# Patient Record
Sex: Male | Born: 1945 | Race: White | Hispanic: No | Marital: Married | Smoking: Former smoker
Health system: Southern US, Community
[De-identification: ages and names within clinical notes are randomized; demographics above are authoritative.]

## PROBLEM LIST (undated history)

## (undated) DIAGNOSIS — K219 Gastro-esophageal reflux disease without esophagitis: Secondary | ICD-10-CM

## (undated) DIAGNOSIS — F419 Anxiety disorder, unspecified: Secondary | ICD-10-CM

## (undated) DIAGNOSIS — I6522 Occlusion and stenosis of left carotid artery: Secondary | ICD-10-CM

## (undated) DIAGNOSIS — Z8719 Personal history of other diseases of the digestive system: Secondary | ICD-10-CM

## (undated) DIAGNOSIS — R0602 Shortness of breath: Secondary | ICD-10-CM

## (undated) DIAGNOSIS — J40 Bronchitis, not specified as acute or chronic: Secondary | ICD-10-CM

## (undated) DIAGNOSIS — F319 Bipolar disorder, unspecified: Secondary | ICD-10-CM

## (undated) DIAGNOSIS — I639 Cerebral infarction, unspecified: Secondary | ICD-10-CM

## (undated) DIAGNOSIS — M1990 Unspecified osteoarthritis, unspecified site: Secondary | ICD-10-CM

## (undated) DIAGNOSIS — G4730 Sleep apnea, unspecified: Secondary | ICD-10-CM

## (undated) DIAGNOSIS — I10 Essential (primary) hypertension: Secondary | ICD-10-CM

## (undated) DIAGNOSIS — C801 Malignant (primary) neoplasm, unspecified: Secondary | ICD-10-CM

## (undated) HISTORY — PX: FRACTURE SURGERY: SHX138

## (undated) HISTORY — PX: CHOLECYSTECTOMY: SHX55

## (undated) HISTORY — PX: OTHER SURGICAL HISTORY: SHX169

## (undated) HISTORY — PX: TONSILLECTOMY: SUR1361

## (undated) HISTORY — PX: KNEE SURGERY: SHX244

---

## 1997-11-21 ENCOUNTER — Ambulatory Visit (HOSPITAL_COMMUNITY): Admission: RE | Admit: 1997-11-21 | Discharge: 1997-11-21 | Payer: Self-pay | Admitting: Sports Medicine

## 1999-12-21 ENCOUNTER — Emergency Department (HOSPITAL_COMMUNITY): Admission: EM | Admit: 1999-12-21 | Discharge: 1999-12-21 | Payer: Self-pay | Admitting: Emergency Medicine

## 1999-12-21 ENCOUNTER — Encounter: Payer: Self-pay | Admitting: Emergency Medicine

## 2000-04-08 ENCOUNTER — Emergency Department (HOSPITAL_COMMUNITY): Admission: EM | Admit: 2000-04-08 | Discharge: 2000-04-08 | Payer: Self-pay | Admitting: Emergency Medicine

## 2007-04-23 ENCOUNTER — Encounter: Admission: RE | Admit: 2007-04-23 | Discharge: 2007-04-23 | Payer: Self-pay | Admitting: Rheumatology

## 2007-12-13 ENCOUNTER — Encounter: Admission: RE | Admit: 2007-12-13 | Discharge: 2007-12-13 | Payer: Self-pay | Admitting: Family Medicine

## 2009-05-14 ENCOUNTER — Encounter: Admission: RE | Admit: 2009-05-14 | Discharge: 2009-07-16 | Payer: Self-pay | Admitting: *Deleted

## 2011-02-27 ENCOUNTER — Emergency Department: Payer: Self-pay | Admitting: Unknown Physician Specialty

## 2011-03-01 ENCOUNTER — Other Ambulatory Visit: Payer: Self-pay | Admitting: Orthopedic Surgery

## 2011-03-01 ENCOUNTER — Ambulatory Visit
Admission: RE | Admit: 2011-03-01 | Discharge: 2011-03-01 | Disposition: A | Discharge status: Home / Self Care | Payer: Medicare Other | Source: Ambulatory Visit | Attending: Orthopedic Surgery | Admitting: Orthopedic Surgery

## 2011-03-01 ENCOUNTER — Encounter (HOSPITAL_COMMUNITY): Payer: Self-pay | Admitting: Physician Assistant

## 2011-03-01 DIAGNOSIS — S42309A Unspecified fracture of shaft of humerus, unspecified arm, initial encounter for closed fracture: Secondary | ICD-10-CM

## 2011-03-01 MED ORDER — CEFAZOLIN SODIUM 1-5 GM-% IV SOLN
1.0000 g | INTRAVENOUS | Status: DC
  Filled 2011-03-01: qty 50
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
## 2011-03-02 ENCOUNTER — Ambulatory Visit (HOSPITAL_COMMUNITY): Payer: Non-veteran care

## 2011-03-02 ENCOUNTER — Inpatient Hospital Stay (HOSPITAL_COMMUNITY)
Admission: RE | Admit: 2011-03-02 | Discharge: 2011-03-04 | DRG: 494 | Disposition: A | Discharge status: Home Health | Payer: Non-veteran care | Source: Ambulatory Visit | Attending: Orthopedic Surgery | Admitting: Orthopedic Surgery

## 2011-03-02 ENCOUNTER — Encounter (HOSPITAL_COMMUNITY): Payer: Self-pay | Admitting: *Deleted

## 2011-03-02 ENCOUNTER — Encounter (HOSPITAL_COMMUNITY): Payer: Self-pay | Admitting: Pharmacy Technician

## 2011-03-02 ENCOUNTER — Other Ambulatory Visit: Payer: Self-pay

## 2011-03-02 DIAGNOSIS — E785 Hyperlipidemia, unspecified: Secondary | ICD-10-CM | POA: Diagnosis present

## 2011-03-02 DIAGNOSIS — E669 Obesity, unspecified: Secondary | ICD-10-CM | POA: Diagnosis present

## 2011-03-02 DIAGNOSIS — G4730 Sleep apnea, unspecified: Secondary | ICD-10-CM | POA: Diagnosis present

## 2011-03-02 DIAGNOSIS — S42209A Unspecified fracture of upper end of unspecified humerus, initial encounter for closed fracture: Secondary | ICD-10-CM | POA: Diagnosis present

## 2011-03-02 DIAGNOSIS — S42253A Displaced fracture of greater tuberosity of unspecified humerus, initial encounter for closed fracture: Principal | ICD-10-CM | POA: Diagnosis present

## 2011-03-02 DIAGNOSIS — Z01818 Encounter for other preprocedural examination: Secondary | ICD-10-CM

## 2011-03-02 DIAGNOSIS — K219 Gastro-esophageal reflux disease without esophagitis: Secondary | ICD-10-CM | POA: Diagnosis present

## 2011-03-02 DIAGNOSIS — I10 Essential (primary) hypertension: Secondary | ICD-10-CM | POA: Diagnosis present

## 2011-03-02 DIAGNOSIS — Z01810 Encounter for preprocedural cardiovascular examination: Secondary | ICD-10-CM

## 2011-03-02 HISTORY — DX: Shortness of breath: R06.02

## 2011-03-02 HISTORY — DX: Bronchitis, not specified as acute or chronic: J40

## 2011-03-02 HISTORY — DX: Gastro-esophageal reflux disease without esophagitis: K21.9

## 2011-03-02 HISTORY — DX: Anxiety disorder, unspecified: F41.9

## 2011-03-02 HISTORY — DX: Cerebral infarction, unspecified: I63.9

## 2011-03-02 HISTORY — DX: Sleep apnea, unspecified: G47.30

## 2011-03-02 LAB — DIFFERENTIAL
Basophils Absolute: 0 10*3/uL (ref 0.0–0.1)
Basophils Relative: 0 % (ref 0–1)
Lymphocytes Relative: 21 % (ref 12–46)
Monocytes Relative: 12 % (ref 3–12)
Neutro Abs: 6.3 10*3/uL (ref 1.7–7.7)
Neutrophils Relative %: 62 % (ref 43–77)

## 2011-03-02 LAB — CBC
Hemoglobin: 13.2 g/dL (ref 13.0–17.0)
MCHC: 34.1 g/dL (ref 30.0–36.0)
RDW: 11.9 % (ref 11.5–15.5)
WBC: 10.2 10*3/uL (ref 4.0–10.5)

## 2011-03-02 LAB — BASIC METABOLIC PANEL
CO2: 24 mEq/L (ref 19–32)
Chloride: 99 mEq/L (ref 96–112)
GFR calc Af Amer: 89 mL/min — ABNORMAL LOW (ref >90)
Potassium: 3.9 mEq/L (ref 3.5–5.1)

## 2011-03-02 LAB — PROTIME-INR: Prothrombin Time: 13.9 seconds (ref 11.6–15.2)

## 2011-03-02 LAB — SURGICAL PCR SCREEN
MRSA, PCR: NEGATIVE
Staphylococcus aureus: NEGATIVE

## 2011-03-02 MED ORDER — POTASSIUM CHLORIDE IN NACL 20-0.9 MEQ/L-% IV SOLN
INTRAVENOUS | Status: DC
  Filled 2011-03-02: qty 1000

## 2011-03-02 MED ORDER — SODIUM CHLORIDE 0.9 % IV SOLN
INTRAVENOUS | Status: DC
  Administered 2011-03-02 – 2011-03-03 (×2): via INTRAVENOUS

## 2011-03-02 MED ORDER — MORPHINE SULFATE 4 MG/ML IJ SOLN
4.0000 mg | INTRAMUSCULAR | Status: DC | PRN
  Administered 2011-03-02 – 2011-03-03 (×5): 4 mg via INTRAVENOUS
  Filled 2011-03-02 (×3): qty 1

## 2011-03-02 MED ORDER — MORPHINE SULFATE 4 MG/ML IJ SOLN
INTRAMUSCULAR | Status: AC
  Administered 2011-03-02: 4 mg via INTRAVENOUS
  Filled 2011-03-02: qty 1

## 2011-03-02 MED ORDER — CHLORHEXIDINE GLUCONATE 4 % EX LIQD: 60.0000 mL | Freq: Once | CUTANEOUS | Status: DC

## 2011-03-02 MED ORDER — MUPIROCIN 2 % EX OINT
TOPICAL_OINTMENT | CUTANEOUS | Status: AC
  Administered 2011-03-02: 1
  Filled 2011-03-02: qty 22

## 2011-03-02 NOTE — Interval H&P Note (Signed)
History and Physical Interval Note:   03/02/2011   4:31 PM   Melvin Foley  has presented today for surgery, with the diagnosis of left proximal humerus fracture  The various methods of treatment have been discussed with the patient and family. After consideration of risks, benefits and other options for treatment, the patient has consented to  Procedure(s): OPEN REDUCTION INTERNAL FIXATION (ORIF) SHOULDER FRACTURE as a surgical intervention .  The patients' history has been reviewed, patient examined, no change in status, stable for surgery.  I have reviewed the patients' chart and labs.  Questions were answered to the patient's satisfaction.     Verlee Rossetti  MD

## 2011-03-02 NOTE — Progress Notes (Signed)
Report given to pt's RN Harriett Sine.  Pt transferred to room 5034.

## 2011-03-02 NOTE — Interval H&P Note (Signed)
History and Physical Interval Note:   03/02/2011   5:09 PM   Melvin Foley  has presented today for surgery, with the diagnosis of left proximal humerus fracture  The various methods of treatment have been discussed with the patient and family. After consideration of risks, benefits and other options for treatment, the patient has consented to  Procedure(s): OPEN REDUCTION INTERNAL FIXATION (ORIF) SHOULDER FRACTURE as a surgical intervention .  The patients' history has been reviewed, patient examined, no change in status, stable for surgery.  I have reviewed the patients' chart and labs.  Questions were answered to the patient's satisfaction.     Verlee Rossetti  MD

## 2011-03-03 ENCOUNTER — Encounter (HOSPITAL_COMMUNITY): Payer: Self-pay

## 2011-03-03 ENCOUNTER — Encounter (HOSPITAL_COMMUNITY)
Admission: RE | Disposition: A | Discharge status: Home Health | Payer: Self-pay | Source: Ambulatory Visit | Attending: Orthopedic Surgery

## 2011-03-03 ENCOUNTER — Inpatient Hospital Stay (HOSPITAL_COMMUNITY): Payer: Non-veteran care

## 2011-03-03 HISTORY — PX: ORIF SHOULDER FRACTURE: SHX5035

## 2011-03-03 SURGERY — OPEN REDUCTION INTERNAL FIXATION (ORIF) SHOULDER FRACTURE
Anesthesia: General | Laterality: Left | Wound class: Clean

## 2011-03-03 MED ORDER — FINASTERIDE 5 MG PO TABS
5.0000 mg | ORAL_TABLET | Freq: Every day | ORAL | Status: DC
Start: 2011-03-03 — End: 2011-03-04
  Administered 2011-03-03: 5 mg via ORAL
  Filled 2011-03-03 (×2): qty 1

## 2011-03-03 MED ORDER — PROMETHAZINE HCL 25 MG/ML IJ SOLN: 6.2500 mg | INTRAMUSCULAR | Status: DC | PRN

## 2011-03-03 MED ORDER — BISACODYL 5 MG PO TBEC: 10.0000 mg | DELAYED_RELEASE_TABLET | Freq: Every day | ORAL | Status: DC | PRN

## 2011-03-03 MED ORDER — MIDAZOLAM HCL 5 MG/5ML IJ SOLN
INTRAMUSCULAR | Status: DC | PRN
  Administered 2011-03-03: 2 mg via INTRAVENOUS

## 2011-03-03 MED ORDER — LACTATED RINGERS IV SOLN
INTRAVENOUS | Status: DC | PRN
  Administered 2011-03-03 (×3): via INTRAVENOUS

## 2011-03-03 MED ORDER — SODIUM CHLORIDE 0.9 % IJ SOLN: 9.0000 mL | INTRAMUSCULAR | Status: DC | PRN

## 2011-03-03 MED ORDER — THIOTHIXENE 5 MG PO CAPS
5.0000 mg | ORAL_CAPSULE | Freq: Three times a day (TID) | ORAL | Status: DC
  Administered 2011-03-03 (×3): 5 mg via ORAL
  Filled 2011-03-03 (×5): qty 1

## 2011-03-03 MED ORDER — GLYCOPYRROLATE 0.2 MG/ML IJ SOLN
INTRAMUSCULAR | Status: DC | PRN
  Administered 2011-03-03: .4 mg via INTRAVENOUS

## 2011-03-03 MED ORDER — ACETAMINOPHEN 650 MG RE SUPP: 650.0000 mg | Freq: Four times a day (QID) | RECTAL | Status: DC | PRN

## 2011-03-03 MED ORDER — CEFAZOLIN SODIUM-DEXTROSE 2-3 GM-% IV SOLR
2.0000 g | Freq: Once | INTRAVENOUS | Status: AC
  Administered 2011-03-03: 2 g via INTRAVENOUS
  Filled 2011-03-03: qty 50

## 2011-03-03 MED ORDER — METOCLOPRAMIDE HCL 5 MG/ML IJ SOLN
5.0000 mg | Freq: Three times a day (TID) | INTRAMUSCULAR | Status: DC | PRN
  Filled 2011-03-03: qty 2

## 2011-03-03 MED ORDER — ONDANSETRON HCL 4 MG/2ML IJ SOLN
4.0000 mg | Freq: Four times a day (QID) | INTRAMUSCULAR | Status: DC | PRN
  Administered 2011-03-03: 4 mg via INTRAVENOUS
  Filled 2011-03-03: qty 2

## 2011-03-03 MED ORDER — MEPERIDINE HCL 25 MG/ML IJ SOLN: 6.2500 mg | INTRAMUSCULAR | Status: DC | PRN

## 2011-03-03 MED ORDER — METOCLOPRAMIDE HCL 10 MG PO TABS
5.0000 mg | ORAL_TABLET | Freq: Three times a day (TID) | ORAL | Status: DC | PRN
Start: 2011-03-03 — End: 2011-03-04

## 2011-03-03 MED ORDER — SODIUM CHLORIDE 0.9 % IR SOLN
Status: DC | PRN
  Administered 2011-03-03: 1000 mL

## 2011-03-03 MED ORDER — SUFENTANIL CITRATE 50 MCG/ML IV SOLN
INTRAVENOUS | Status: DC | PRN
Start: 2011-03-03 — End: 2011-03-03
  Administered 2011-03-03 (×4): 10 ug via INTRAVENOUS

## 2011-03-03 MED ORDER — PROPRANOLOL HCL 20 MG PO TABS
20.0000 mg | ORAL_TABLET | Freq: Every day | ORAL | Status: DC
  Administered 2011-03-03 (×2): 20 mg via ORAL
  Filled 2011-03-03 (×3): qty 1

## 2011-03-03 MED ORDER — NEOSTIGMINE METHYLSULFATE 1 MG/ML IJ SOLN
INTRAMUSCULAR | Status: DC | PRN
  Administered 2011-03-03: 3 mg via INTRAVENOUS

## 2011-03-03 MED ORDER — PROPOFOL 10 MG/ML IV EMUL
INTRAVENOUS | Status: DC | PRN
  Administered 2011-03-03: 160 mg via INTRAVENOUS

## 2011-03-03 MED ORDER — MAGNESIUM HYDROXIDE 400 MG/5ML PO SUSP: 30.0000 mL | Freq: Two times a day (BID) | ORAL | Status: DC | PRN

## 2011-03-03 MED ORDER — CEFAZOLIN SODIUM 1-5 GM-% IV SOLN
1.0000 g | Freq: Three times a day (TID) | INTRAVENOUS | Status: AC
  Administered 2011-03-03 (×3): 1 g via INTRAVENOUS
  Filled 2011-03-03 (×3): qty 50

## 2011-03-03 MED ORDER — FLEET ENEMA 7-19 GM/118ML RE ENEM: 1.0000 | ENEMA | Freq: Every day | RECTAL | Status: DC | PRN

## 2011-03-03 MED ORDER — ACETAMINOPHEN 325 MG PO TABS: 650.0000 mg | ORAL_TABLET | Freq: Four times a day (QID) | ORAL | Status: DC | PRN

## 2011-03-03 MED ORDER — DIPHENHYDRAMINE HCL 50 MG/ML IJ SOLN: 12.5000 mg | Freq: Four times a day (QID) | INTRAMUSCULAR | Status: DC | PRN

## 2011-03-03 MED ORDER — ONDANSETRON HCL 4 MG PO TABS: 4.0000 mg | ORAL_TABLET | Freq: Four times a day (QID) | ORAL | Status: DC | PRN

## 2011-03-03 MED ORDER — SIMVASTATIN 40 MG PO TABS
40.0000 mg | ORAL_TABLET | Freq: Every day | ORAL | Status: DC
  Filled 2011-03-03: qty 1

## 2011-03-03 MED ORDER — ONDANSETRON HCL 4 MG/2ML IJ SOLN
INTRAMUSCULAR | Status: DC | PRN
  Administered 2011-03-03: 4 mg via INTRAVENOUS

## 2011-03-03 MED ORDER — OXYCODONE-ACETAMINOPHEN 5-325 MG PO TABS: 1.0000 | ORAL_TABLET | ORAL | Status: DC | PRN

## 2011-03-03 MED ORDER — HYDROMORPHONE HCL PF 1 MG/ML IJ SOLN
0.5000 mg | INTRAMUSCULAR | Status: DC | PRN
  Administered 2011-03-03: 0.5 mg via INTRAVENOUS
  Administered 2011-03-04 (×2): 1 mg via INTRAVENOUS
  Filled 2011-03-03 (×3): qty 1

## 2011-03-03 MED ORDER — SODIUM CHLORIDE 0.9 % IV SOLN: INTRAVENOUS | Status: DC

## 2011-03-03 MED ORDER — ROCURONIUM BROMIDE 100 MG/10ML IV SOLN
INTRAVENOUS | Status: DC | PRN
  Administered 2011-03-03: 50 mg via INTRAVENOUS

## 2011-03-03 MED ORDER — MENTHOL 3 MG MT LOZG: 1.0000 | LOZENGE | OROMUCOSAL | Status: DC | PRN

## 2011-03-03 MED ORDER — DIPHENHYDRAMINE HCL 12.5 MG/5ML PO ELIX
12.5000 mg | ORAL_SOLUTION | ORAL | Status: DC | PRN
  Filled 2011-03-03: qty 10

## 2011-03-03 MED ORDER — OXYCODONE-ACETAMINOPHEN 5-325 MG PO TABS
2.0000 | ORAL_TABLET | ORAL | Status: DC | PRN
  Administered 2011-03-03 – 2011-03-04 (×5): 2 via ORAL
  Filled 2011-03-03 (×5): qty 2

## 2011-03-03 MED ORDER — EPHEDRINE SULFATE 50 MG/ML IJ SOLN
INTRAMUSCULAR | Status: DC | PRN
  Administered 2011-03-03: 10 mg via INTRAVENOUS
  Administered 2011-03-03: 5 mg via INTRAVENOUS
  Administered 2011-03-03: 10 mg via INTRAVENOUS
  Administered 2011-03-03: 5 mg via INTRAVENOUS

## 2011-03-03 MED ORDER — POLYETHYLENE GLYCOL 3350 17 G PO PACK
17.0000 g | PACK | Freq: Every day | ORAL | Status: DC | PRN
  Filled 2011-03-03: qty 1

## 2011-03-03 MED ORDER — METHOCARBAMOL 500 MG PO TABS
500.0000 mg | ORAL_TABLET | Freq: Four times a day (QID) | ORAL | Status: DC | PRN
  Administered 2011-03-04 (×3): 500 mg via ORAL
  Filled 2011-03-03 (×3): qty 1

## 2011-03-03 MED ORDER — ONDANSETRON HCL 4 MG/2ML IJ SOLN: 4.0000 mg | Freq: Four times a day (QID) | INTRAMUSCULAR | Status: DC | PRN

## 2011-03-03 MED ORDER — DEXAMETHASONE SODIUM PHOSPHATE 4 MG/ML IJ SOLN
INTRAMUSCULAR | Status: DC | PRN
  Administered 2011-03-03: 4 mg via INTRAVENOUS

## 2011-03-03 MED ORDER — HYDROMORPHONE HCL PF 1 MG/ML IJ SOLN
0.2500 mg | INTRAMUSCULAR | Status: DC | PRN
  Administered 2011-03-03 (×4): 0.5 mg via INTRAVENOUS

## 2011-03-03 MED ORDER — NALOXONE HCL 0.4 MG/ML IJ SOLN: 0.4000 mg | INTRAMUSCULAR | Status: DC | PRN

## 2011-03-03 MED ORDER — THIOTHIXENE 5 MG PO CAPS
5.0000 mg | ORAL_CAPSULE | Freq: Three times a day (TID) | ORAL | Status: DC
  Administered 2011-03-03: 5 mg via ORAL
  Filled 2011-03-03 (×4): qty 1

## 2011-03-03 MED ORDER — DOCUSATE SODIUM 100 MG PO CAPS
100.0000 mg | ORAL_CAPSULE | Freq: Two times a day (BID) | ORAL | Status: DC
  Administered 2011-03-03 (×2): 100 mg via ORAL
  Filled 2011-03-03 (×4): qty 1

## 2011-03-03 MED ORDER — KETOROLAC TROMETHAMINE 30 MG/ML IJ SOLN
15.0000 mg | Freq: Once | INTRAMUSCULAR | Status: AC | PRN
  Administered 2011-03-03: 30 mg via INTRAVENOUS

## 2011-03-03 MED ORDER — METHOCARBAMOL 100 MG/ML IJ SOLN
500.0000 mg | Freq: Four times a day (QID) | INTRAVENOUS | Status: DC | PRN
  Filled 2011-03-03: qty 5

## 2011-03-03 MED ORDER — PHENOL 1.4 % MT LIQD
1.0000 | OROMUCOSAL | Status: DC | PRN
  Filled 2011-03-03: qty 177

## 2011-03-03 MED ORDER — CEFAZOLIN SODIUM 1-5 GM-% IV SOLN
INTRAVENOUS | Status: AC
  Filled 2011-03-03: qty 100

## 2011-03-03 MED ORDER — DIPHENHYDRAMINE HCL 12.5 MG/5ML PO ELIX
12.5000 mg | ORAL_SOLUTION | Freq: Four times a day (QID) | ORAL | Status: DC | PRN
  Filled 2011-03-03: qty 5

## 2011-03-03 MED ORDER — SIMVASTATIN 40 MG PO TABS
40.0000 mg | ORAL_TABLET | Freq: Every day | ORAL | Status: DC
  Administered 2011-03-03: 40 mg via ORAL
  Filled 2011-03-03: qty 1

## 2011-03-03 MED ORDER — HYDROMORPHONE 0.3 MG/ML IV SOLN
INTRAVENOUS | Status: DC
  Administered 2011-03-03: 0.4 mg via INTRAVENOUS
  Administered 2011-03-03: 7.5 mg via INTRAVENOUS
  Filled 2011-03-03: qty 25

## 2011-03-03 MED ORDER — BUPIVACAINE-EPINEPHRINE PF 0.25-1:200000 % IJ SOLN
INTRAMUSCULAR | Status: DC | PRN
  Administered 2011-03-03: 10 mL

## 2011-03-03 MED ORDER — BISACODYL 10 MG RE SUPP: 10.0000 mg | Freq: Every day | RECTAL | Status: DC | PRN

## 2011-03-03 SURGICAL SUPPLY — 58 items
BIT DRILL 4 LONG FAST STEP (BIT) ×1 IMPLANT
BIT DRILL 4 SHORT FAST STEP (BIT) ×1 IMPLANT
BIT DRILL SNP 4.0MM (BIT) IMPLANT
CLOTH BEACON ORANGE TIMEOUT ST (SAFETY) ×2 IMPLANT
DRAPE INCISE IOBAN 66X45 STRL (DRAPES) ×2 IMPLANT
DRAPE U-SHAPE 47X51 STRL (DRAPES) ×2 IMPLANT
DRILL BIT SNP 4.0MM (BIT) ×1
DRSG ADAPTIC 3X8 NADH LF (GAUZE/BANDAGES/DRESSINGS) ×1 IMPLANT
DRSG EMULSION OIL 3X3 NADH (GAUZE/BANDAGES/DRESSINGS) ×2 IMPLANT
DRSG PAD ABDOMINAL 8X10 ST (GAUZE/BANDAGES/DRESSINGS) ×2 IMPLANT
ELECT REM PT RETURN 9FT ADLT (ELECTROSURGICAL) ×2
ELECTRODE REM PT RTRN 9FT ADLT (ELECTROSURGICAL) ×1 IMPLANT
GLOVE BIOGEL PI ORTHO PRO SZ8 (GLOVE) ×1
GLOVE ORTHO TXT STRL SZ7.5 (GLOVE) ×2 IMPLANT
GLOVE PI ORTHO PRO STRL SZ8 (GLOVE) ×1 IMPLANT
GLOVE SURG ORTHO 8.5 STRL (GLOVE) ×2 IMPLANT
GOWN STRL NON-REIN LRG LVL3 (GOWN DISPOSABLE) ×4 IMPLANT
KIT BASIN OR (CUSTOM PROCEDURE TRAY) ×2 IMPLANT
KIT ROOM TURNOVER OR (KITS) ×2 IMPLANT
MANIFOLD NEPTUNE II (INSTRUMENTS) ×2 IMPLANT
NDL 1/2 CIR MAYO (NEEDLE) IMPLANT
NDL SUT 6 .5 CRC .975X.05 MAYO (NEEDLE) ×1 IMPLANT
NEEDLE 1/2 CIR MAYO (NEEDLE) ×2 IMPLANT
NEEDLE 22X1 1/2 (OR ONLY) (NEEDLE) ×1 IMPLANT
NEEDLE MAYO TAPER (NEEDLE) ×2
NS IRRIG 1000ML POUR BTL (IV SOLUTION) ×2 IMPLANT
PACK SHOULDER (CUSTOM PROCEDURE TRAY) ×2 IMPLANT
PASSER SUT SWANSON 36MM LOOP (INSTRUMENTS) ×1 IMPLANT
PEG STND 4.0X25.0MM (Orthopedic Implant) ×1 IMPLANT
PEG STND 4.0X32.5MM (Orthopedic Implant) ×2 IMPLANT
PEG STND 4.0X40MM (Orthopedic Implant) ×2 IMPLANT
PEG STND 4.0X45.0MM (Orthopedic Implant) ×4 IMPLANT
PEGSTD 4.0X32.5MM (Orthopedic Implant) IMPLANT
PEGSTD 4.0X40MM (Orthopedic Implant) IMPLANT
PEGSTD 4.0X45.0MM (Orthopedic Implant) IMPLANT
PLATE SZ3 SHOULDER NAIL SNP (Plate) ×1 IMPLANT
SCREW SNP UNICORTICAL (Screw) ×3 IMPLANT
SPONGE GAUZE 4X4 12PLY (GAUZE/BANDAGES/DRESSINGS) ×2 IMPLANT
SPONGE LAP 4X18 X RAY DECT (DISPOSABLE) ×2 IMPLANT
STAPLER VISISTAT 35W (STAPLE) ×2 IMPLANT
STRIP CLOSURE SKIN 1/2X4 (GAUZE/BANDAGES/DRESSINGS) ×2 IMPLANT
SUCTION FRAZIER TIP 10 FR DISP (SUCTIONS) ×2 IMPLANT
SUT BONE WAX W31G (SUTURE) IMPLANT
SUT ETHIBOND NAB CT1 #1 30IN (SUTURE) ×2 IMPLANT
SUT FIBERWIRE #2 38 T-5 BLUE (SUTURE) ×8
SUT MNCRL AB 4-0 PS2 18 (SUTURE) ×2 IMPLANT
SUT VIC AB 0 CT1 27 (SUTURE) ×2
SUT VIC AB 0 CT1 27XBRD ANBCTR (SUTURE) ×1 IMPLANT
SUT VIC AB 2-0 CT1 27 (SUTURE) ×4
SUT VIC AB 2-0 CT1 TAPERPNT 27 (SUTURE) ×2 IMPLANT
SUT VICRYL 4-0 PS2 18IN ABS (SUTURE) ×1 IMPLANT
SUTURE FIBERWR #2 38 T-5 BLUE (SUTURE) IMPLANT
SYR CONTROL 10ML LL (SYRINGE) ×2 IMPLANT
TAPE CLOTH SURG 4X10 WHT LF (GAUZE/BANDAGES/DRESSINGS) ×1 IMPLANT
TOWEL OR 17X24 6PK STRL BLUE (TOWEL DISPOSABLE) ×2 IMPLANT
TOWEL OR 17X26 10 PK STRL BLUE (TOWEL DISPOSABLE) ×2 IMPLANT
WATER STERILE IRR 1000ML POUR (IV SOLUTION) ×1 IMPLANT
YANKAUER SUCT BULB TIP NO VENT (SUCTIONS) ×2 IMPLANT

## 2011-03-03 NOTE — Progress Notes (Signed)
PT cancelled today due to increased pain and agitation with OT. WIll attempt tomorrow  03/03/2011 Milana Kidney DPT PAGER: [phone] OFFICE: [phone]

## 2011-03-03 NOTE — Progress Notes (Signed)
Pt placed on CPAP per MD order.  CPAP setting at 8cmH20 per home setting.  Pt is using home mask and circuit.  3L 02 bleed in to keep spo2 >90%.  Will continue to monitor.

## 2011-03-03 NOTE — Progress Notes (Signed)
Patient resting shortly after returning from PACU from a left shoulder proximal humerus ORIF. Pt states his pain is tolerable currently. Denies numbness or tingling distally. Wearing sling currently.  Left shoulder: dressing in place, nv intact distally, no signs of bleeding or drainage Pt asking for a CPAP and about when he will be discharged Assessment: s/p left proximal humerus ORIF Plan: D/c foley, order CPAP, plan for discharge tomorrow as long as pain is under control. Recommend home health physical and occupational therapy.  Pain control.

## 2011-03-03 NOTE — Anesthesia Preprocedure Evaluation (Addendum)
Anesthesia Evaluation  Patient identified by MRN, date of birth, ID band Patient awake    Reviewed: Allergy & Precautions, H&P , NPO status   Airway Mallampati: II  Neck ROM: Full    Dental  (+) Teeth Intact   Pulmonary shortness of breath and with exertion, sleep apnea ,  clear to auscultation        Cardiovascular Normal    Neuro/Psych CVA    GI/Hepatic GERD-  ,  Endo/Other    Renal/GU      Musculoskeletal   Abdominal (+) obese,   Peds  Hematology   Anesthesia Other Findings   Reproductive/Obstetrics                           Anesthesia Physical Anesthesia Plan  ASA: II  Anesthesia Plan:    Post-op Pain Management:    Induction: Intravenous  Airway Management Planned: Oral ETT  Additional Equipment:   Intra-op Plan:   Post-operative Plan: Extubation in OR  Informed Consent: I have reviewed the patients History and Physical, chart, labs and discussed the procedure including the risks, benefits and alternatives for the proposed anesthesia with the patient or authorized representative who has indicated his/her understanding and acceptance.   Dental advisory given  Plan Discussed with: CRNA and Surgeon  Anesthesia Plan Comments:        Anesthesia Quick Evaluation

## 2011-03-03 NOTE — Progress Notes (Signed)
  Orthopedics Progress Note  Subjective: Patient reports severe pain in the shoulder.  He is chasing the pain with the PCA.   Objective:  Filed Vitals:   03/03/11 1000  BP:   Pulse: 92  Temp:   Resp: 16    General: Awake and alert  Musculoskeletal: Right shoulder dressing CDI.  NVI in the right UE.  Relatively pain with AAROM. Neurovascularly intact  Lab Results  Component Value Date   WBC 10.2 03/02/2011   HGB 13.2 03/02/2011   HCT 38.7* 03/02/2011   MCV 88.2 03/02/2011   PLT 171 03/02/2011       Component Value Date/Time   NA 135 03/02/2011 1544   K 3.9 03/02/2011 1544   CL 99 03/02/2011 1544   CO2 24 03/02/2011 1544   GLUCOSE 95 03/02/2011 1544   BUN 15 03/02/2011 1544   CREATININE 1.00 03/02/2011 1544   CALCIUM 9.0 03/02/2011 1544   GFRNONAA 76* 03/02/2011 1544   GFRAA 89* 03/02/2011 1544    Lab Results  Component Value Date   INR 1.05 03/02/2011    Assessment/Plan: POD #1 s/p Procedure(s): OPEN REDUCTION INTERNAL FIXATION (ORIF) SHOULDER FRACTURE Stable at this point.  Will d/c PCA and change to PO meds with IV Dilaudid breakthrough.  Almedia Balls. Ranell Patrick, MD 03/03/2011 5:19 PM

## 2011-03-03 NOTE — Transfer of Care (Signed)
Immediate Anesthesia Transfer of Care Note  Patient: Melvin Foley  Procedure(s) Performed:  OPEN REDUCTION INTERNAL FIXATION (ORIF) SHOULDER FRACTURE - LEFT PROXIMAL HUMERUS Open Reduction internal fixation  Patient Location: PACU  Anesthesia Type: General  Level of Consciousness: awake, oriented, sedated, patient cooperative and responds to stimulation  Airway & Oxygen Therapy: Patient Spontanous Breathing and Patient connected to nasal cannula oxygen  Post-op Assessment: Report given to PACU RN, Post -op Vital signs reviewed and stable and Patient moving all extremities  Post vital signs: Reviewed and stable  Complications: No apparent anesthesia complications

## 2011-03-03 NOTE — Brief Op Note (Signed)
03/02/2011 - 03/03/2011  4:22 AM  PATIENT:  Melvin Foley  65 y.o. male  PRE-OPERATIVE DIAGNOSIS:  left proximal humerus fracture, displaced  POST-OPERATIVE DIAGNOSIS:  left proximal humerus fracture, displaced  PROCEDURE:  Procedure(s): OPEN REDUCTION INTERNAL FIXATION (ORIF) Left SHOULDER FRACTURE, De Puy SNP(Nail Plate)  SURGEON:  Surgeon(s): Verlee Rossetti  PHYSICIAN ASSISTANT:   ASSISTANTS: Thea Gist, PA-C   ANESTHESIA:   regional and general  EBL:  Total I/O In: 2100 [I.V.:2100] Out: 350 [Urine:350]  BLOOD ADMINISTERED:none  DRAINS: none   LOCAL MEDICATIONS USED:  MARCAINE 10 CC  SPECIMEN:  No Specimen  DISPOSITION OF SPECIMEN:  N/A  COUNTS:  YES  TOURNIQUET:  * No tourniquets in log *  DICTATION: .Other Dictation: Dictation Number [id number]  PLAN OF CARE: Admit to inpatient   PATIENT DISPOSITION:  PACU - hemodynamically stable.   Delay start of Pharmacological VTE agent (>24hrs) due to surgical blood loss or risk of bleeding:  {YES/NO/NOT APPLICABLE:20182

## 2011-03-03 NOTE — Progress Notes (Signed)
Patient is being seen acute  By OT services secondary to:  Pt. has acute therapy needs. Please refer to evaluation form in ghost chart for therapy notes. Recommend pt. progress rehab of shoulder as ordered by MD post follow-up appointment. Pt. Required min A with ADLs and is min A with ADL Mobility. Pt will have all necessary A and DME for safe d/c home. OT will see 03/04/11 prior to pt d/c home to continue education.  Progress and goals were discussed with patient and pt agreed.   03/03/2011  Lucile Shutters OTR/L  Pager: [phone]

## 2011-03-03 NOTE — Op Note (Signed)
NAMEBRANNEN, KOPPEN          ACCOUNT NO.:  [id number]  MEDICAL RECORD NO.:  [id number]  LOCATION:  5034                         FACILITY:  [facility]  PHYSICIAN:  Almedia Balls. Ranell Patrick, M.D. DATE OF BIRTH:  [birth year]  DATE OF PROCEDURE: DATE OF DISCHARGE:                              OPERATIVE REPORT   PREOPERATIVE DIAGNOSIS:  Left shoulder displaced three-part proximal humerus fracture.  POSTOPERATIVE DIAGNOSIS:  Left shoulder displaced three-part proximal humerus fracture.  PROCEDURE PERFORMED:  Open reduction and internal fixation of left displaced proximal humerus fracture using DePuy SNP.  ATTENDING SURGEON:  Almedia Balls. Ranell Patrick, M.D.  ASSISTANT:  Donnie Coffin. Dixon, PA-C, who was present and scrubbed during the entire surgery, necessary for appropriate completion of surgery.  ANESTHESIA:  General anesthesia was used plus interscalene block.  ESTIMATED BLOOD LOSS:  100 mL.  FLUID REPLACEMENT:  1500 mL crystalloid.  URINE OUTPUT:  300 mL.  INSTRUMENT COUNTS:  Correct.  COMPLICATIONS:  No complications.  Perioperative antibiotics were given.  INDICATION:  Patient is a 65 year old male who sustained an injury to his left shoulder.  The patient presented to the [facility] with an obviously displaced proximal humerus fracture and bruising.  The patient was counseled regarding his injury with a displacement of his greater tuberosity and valgus impaction of his humeral head that we recommended surgical reconstruction with realignment of this humeral head and re-attachment of his greater tuberosity.  The patient and his wife were counseled regarding risks and benefits of surgery and are elected to proceed with surgical management.  Informed consent was obtained.  DESCRIPTION OF PROCEDURE:  After adequate anesthesia was achieved, the patient positioned in modified beach chair position.  Left shoulder correctly identified, sterilely prepped, and draped, a time-out  was called.  We then entered the shoulder using standard deltopectoral approach starting at coracoid process extending down to the anterior humerus.  Dissection down through subcutaneous tissues using Bovie. Cephalic vein identified taken laterally with the deltoid.  The pectoralis was taken medially.  Conjoined tendon was identified.  We identified the fractured humerus.  I was able to palpate the greater tuberosity that was displaced, we cut with the Cobb elevator, mobilized it and we were able to place 3 mattress sutures with FiberWire in the rotator cuff just posterior and proximal to the greater tuberosity, gaining good purchase on that structure.  We identified the humeral head and felt like we could lever that up a little bit.  We were extremely pleased with the alignment of the fracture on multiple planes with a C- arm.  Once we had this in alignment, we went ahead and placed our SNP down the humeral shaft.  We then placed a central guide pin, checked on orthogonal 90-degree views AP and lateral.  We felt like the head was in just a slight bit of valgus, but this was stable, we did not want to disrupt that medial soft tissue attachment which has the blood supply and also really great stability for this injury.  So we levered it up as much as we could and we went and placed a central guide pin, checked again AP and lateral views, and then went ahead and filled in the 5 screws  proximally, these were smooth pegs that are locked into the plate, creating a fixed angled construct.  Once we had this 5 pegs in and verified for their depth, make sure we went along on those, then we went ahead and went distal with our jig and placed 3 unicortical screws that went through the lateral humerus and into the implant, verified that those were engaged and took the shoulder through a full range of motion.  We had nice stability to the implant and again no prominent hardware.  Once we had done that  and we went ahead and reduced our greater tuberosity and sutured that to the subscapularis and we had extremely favorable alignment of the greater tuberosity and it really appeared anatomic as we were directly observing the proximal humerus, it was quite stable, no relative motion between the parts and no impingement that we could determine.  There was one little fragment of greater tuberosity that was fairly anterior that I could palpate under the rotator cuff that appeared proud on the x-ray but felt good to me in terms of where its height was and that it would not impinge we are going to leave that as did not want to get into compromise and rotator cuff tear anteriorly. The thickness of the cuff was actually felt like that would protect against any impingement.  We thoroughly irrigated the subdeltoid plane because the deltopectoral interval with 0 Vicryl suture followed by 2-0 Vicryl subcutaneous closure, and 4-0 and Monocryl for skin.  Steri-Strips applied followed by sterile dressing.  The patient tolerated the procedure well.     Almedia Balls. Ranell Patrick, M.D.     SRN/MEDQ  D:  03/03/2011  T:  03/03/2011  Job:  [id number]

## 2011-03-04 ENCOUNTER — Encounter (HOSPITAL_COMMUNITY): Payer: Self-pay | Admitting: Orthopedic Surgery

## 2011-03-04 LAB — BASIC METABOLIC PANEL
CO2: 24 mEq/L (ref 19–32)
Calcium: 8.6 mg/dL (ref 8.4–10.5)
Creatinine, Ser: 0.93 mg/dL (ref 0.50–1.35)
GFR calc Af Amer: >90 mL/min (ref >90)
GFR calc non Af Amer: 86 mL/min — ABNORMAL LOW (ref >90)

## 2011-03-04 LAB — HEMOGLOBIN AND HEMATOCRIT, BLOOD
HCT: 34.2 % — ABNORMAL LOW (ref 39.0–52.0)
Hemoglobin: 11.6 g/dL — ABNORMAL LOW (ref 13.0–17.0)

## 2011-03-04 MED ORDER — OXYCODONE-ACETAMINOPHEN 5-325 MG PO TABS: ORAL_TABLET | ORAL | Status: DC

## 2011-03-04 MED ORDER — METHOCARBAMOL 500 MG PO TABS: 500.0000 mg | ORAL_TABLET | Freq: Four times a day (QID) | ORAL | Status: AC | PRN

## 2011-03-04 NOTE — Progress Notes (Signed)
Physical Therapy Evaluation Patient Details Name: Melvin Foley MRN: [id number] DOB: Apr 15, 1946 Today's Date: 03/04/2011  Problem List:  Patient Active Problem List  Diagnoses  . Proximal humerus fracture    Past Medical History:  Past Medical History  Diagnosis Date  . Stroke   . Shortness of breath   . Bronchitis   . GERD (gastroesophageal reflux disease)   . Anxiety     takes medication  . Sleep apnea    Past Surgical History:  Past Surgical History  Procedure Date  . Knee surgery     x2  . Head injury   . Fracture surgery     right ankle  . Tonsillectomy     PT Assessment/Plan/Recommendation PT Assessment Clinical Impression Statement: Pt presents with minimal balance loss, therefore cane was given to pt. Pt is steady and safe with straight cane with supervision. WIll not follow acutely PT Recommendation/Assessment: Patient does not need any further PT services No Skilled PT: All education completed;Patient will have necessary level of assist by caregiver at discharge Recommend Straight cane for d/c  PT Evaluation Precautions/Restrictions  Restrictions Weight Bearing Restrictions: Yes LUE Weight Bearing: Partial weight bearing Prior Functioning  Home Living Lives With: Spouse Receives Help From: Family Type of Home: House Home Layout: One level Home Access: Stairs to enter Entrance Stairs-Rails: None Entrance Stairs-Number of Steps: 1 (curb) Bathroom Shower/Tub: Engineer, manufacturing systems: Standard Prior Function Level of Independence: Independent with basic ADLs;Independent with gait;Independent with transfers Able to Take Stairs?: Yes Driving: Yes Cognition Cognition Arousal/Alertness: Awake/alert Overall Cognitive Status: Appears within functional limits for tasks assessed Orientation Level: Oriented X4 Sensation/Coordination Sensation Light Touch: Appears Intact Extremity Assessment RLE Assessment RLE Assessment: Within  Functional Limits LLE Assessment LLE Assessment: Within Functional Limits Mobility (including Balance) Bed Mobility Bed Mobility: No Transfers Transfers: Yes Sit to Stand: 5: Supervision Sit to Stand Details (indicate cue type and reason): VC for hand placement Stand to Sit: 5: Supervision Stand to Sit Details: Supervision for safety  Ambulation/Gait Ambulation/Gait: Yes Ambulation/Gait Assistance: 4: Min assist (Minguard assist) Ambulation/Gait Assistance Details (indicate cue type and reason): Assist for balance secondary to abnormal gait Ambulation Distance (Feet): 50 Feet Assistive device: None;Hemi-walker;Straight cane (Decreased assistance needed with straight cane) Gait Pattern: Decreased trunk rotation;Decreased weight shift to left;Decreased hip/knee flexion - right;Decreased hip/knee flexion - left;Decreased stride length Gait velocity: Normal gait speed Stairs: No    Exercise    End of Session PT - End of Session Equipment Utilized During Treatment: Gait belt (Left sling) Activity Tolerance: Patient tolerated treatment well Patient left: in chair;with family/visitor present;with call bell in reach Nurse Communication: Mobility status for ambulation General Behavior During Session: [facility] for tasks performed Cognition: [facility] for tasks performed  Milana Kidney 03/04/2011, 9:38 AM

## 2011-03-04 NOTE — Progress Notes (Signed)
Patient needs cane for home. Entered in TLC.

## 2011-03-04 NOTE — Discharge Summary (Signed)
I have personally examined the patient and agree with the evaluation and treatment plan as documented by Thomas B. Dixon, PA-C.  Steven R. Lettie Czarnecki, MD [facility] [phone] office [phone] pager  

## 2011-03-04 NOTE — Progress Notes (Signed)
  Spoke with patient regarding Home Health. Patient states surgery and all had been preapproved by [place]. Left message with [place] [phone].  Entered in TLC. Patient's correct phone is [phone]

## 2011-03-04 NOTE — Progress Notes (Deleted)
Occupational Therapy Shoulder Evaluation  Patient presents with a medical diagnosis of Left total shoulder arthroplasty. Patients presents with no further acute OT needs secondary to :  All education completed. Pt will have necessary level of assist by caregiver on D/C. Caregiver demonstrates I with assisting pt with PROM and ADLs. Pt will have necessary A for safe d/c home.  Please refer to evaluation form in ghost chart for therapy notes. Recommend pt. progress rehab of shoulder as ordered by MD post follow-up appointment.  03/04/2011  Zackery Brine Elizabeth OTR/L  Pager [phone] Office [phone]  

## 2011-03-04 NOTE — Progress Notes (Signed)
Occupational Therapy Treatment Patient Details Name: Melvin Foley MRN: [id number] DOB: Apr 14, 1946 Today's Date: 03/04/2011  OT Assessment/Plan OT Assessment/Plan OT Plan: Discharge plan remains appropriate Follow Up Recommendations: Other (comment) (follow up with MD in two weeks) Equipment Recommended: Cane OT Goals  See ghost chart- ALL GOALS MET    OT Treatment Precautions/Restrictions  Precautions Precautions: Shoulder Precaution Comments: Sling with ambulation, no Abduction, ER wand education Required Braces or Orthoses: Yes Other Brace/Splint: Sling Restrictions Weight Bearing Restrictions: Yes LUE Weight Bearing: Partial weight bearing Sensation Light Touch: Appears Intact ADL   Mobility  Bed Mobility Bed Mobility: No Transfers Transfers: Yes Sit to Stand: 5: Supervision Sit to Stand Details (indicate cue type and reason): VC for hand placement Stand to Sit: 5: Supervision Stand to Sit Details: Supervision for safety  Exercises Shoulder Exercises Shoulder External Rotation: AAROM;Left;10 reps;Standing (wand exercises) Elbow Flexion: AROM;Standing;10 reps Wrist Flexion: AROM;10 reps;Standing Wrist Extension: AROM;10 reps;Standing Digit Composite Flexion: AROM;10 reps;Standing Pt return demonstrated all three exercises. Min V/c to recall one exercise initially ( shoulder abduction/adduction). Pt don/doff sling Mod I. All education complete with 100% correct return demonstration. Pt unsteady on his feet and could benefit from balance training during ADLS during follow up with MD. End of Session OT - End of Session Equipment Utilized During Treatment: Gait belt Activity Tolerance: Patient tolerated treatment well Patient left: in bed;with call bell in reach;with family/visitor present Nurse Communication: Mobility status for transfers General Behavior During Session: [facility] for tasks performed Cognition: [facility] for tasks performed    Lucile Shutters  03/04/2011, 12:05 PM Pager: [phone]

## 2011-03-04 NOTE — Discharge Summary (Signed)
Physician Discharge Summary  Patient ID: Melvin Foley MRN: [id number] DOB/AGE: 1946-03-03 65 y.o.  Admit date: 03/02/2011 Discharge date: 03/04/2011  Admission Diagnoses:  Principal Problem:  *Proximal humerus fracture   Discharge Diagnoses:  Same   Surgeries: Procedure(s): OPEN REDUCTION INTERNAL FIXATION (ORIF) SHOULDER FRACTURE on 03/02/2011 - 03/03/2011   Consultants: none  Discharged Condition: Stable  Hospital Course: Melvin Foley is an 65 y.o. male who was admitted 03/02/2011 with a chief complaint of No chief complaint on file. , and found to have a diagnosis of Proximal humerus fracture.  They were brought to the operating room on 03/02/2011 - 03/03/2011 and underwent the above named procedures.    The patient had an uncomplicated hospital course and was stable for discharge.  Recent vital signs:  Filed Vitals:   03/04/11 0645  BP: 104/65  Pulse: 69  Temp: 98.9 F (37.2 C)  Resp: 16    Recent laboratory studies:  Results for orders placed during the hospital encounter of 03/02/11  CBC      Component Value Range   WBC 10.2  4.0 - 10.5 (K/uL)   RBC 4.39  4.22 - 5.81 (MIL/uL)   Hemoglobin 13.2  13.0 - 17.0 (g/dL)   HCT 40.9 (*) 81.1 - 52.0 (%)   MCV 88.2  78.0 - 100.0 (fL)   MCH 30.1  26.0 - 34.0 (pg)   MCHC 34.1  30.0 - 36.0 (g/dL)   RDW 91.4  78.2 - 95.6 (%)   Platelets 171  150 - 400 (K/uL)  DIFFERENTIAL      Component Value Range   Neutrophils Relative 62  43 - 77 (%)   Neutro Abs 6.3  1.7 - 7.7 (K/uL)   Lymphocytes Relative 21  12 - 46 (%)   Lymphs Abs 2.2  0.7 - 4.0 (K/uL)   Monocytes Relative 12  3 - 12 (%)   Monocytes Absolute 1.2 (*) 0.1 - 1.0 (K/uL)   Eosinophils Relative 5  0 - 5 (%)   Eosinophils Absolute 0.5  0.0 - 0.7 (K/uL)   Basophils Relative 0  0 - 1 (%)   Basophils Absolute 0.0  0.0 - 0.1 (K/uL)  BASIC METABOLIC PANEL      Component Value Range   Sodium 135  135 - 145 (mEq/L)   Potassium 3.9  3.5 - 5.1 (mEq/L)    Chloride 99  96 - 112 (mEq/L)   CO2 24  19 - 32 (mEq/L)   Glucose, Bld 95  70 - 99 (mg/dL)   BUN 15  6 - 23 (mg/dL)   Creatinine, Ser 2.13  0.50 - 1.35 (mg/dL)   Calcium 9.0  8.4 - 08.6 (mg/dL)   GFR calc non Af Amer 76 (*) >90 (mL/min)   GFR calc Af Amer 89 (*) >90 (mL/min)  PROTIME-INR      Component Value Range   Prothrombin Time 13.9  11.6 - 15.2 (seconds)   INR 1.05  0.00 - 1.49   SURGICAL PCR SCREEN      Component Value Range   MRSA, PCR NEGATIVE  NEGATIVE    Staphylococcus aureus NEGATIVE  NEGATIVE   HEMOGLOBIN AND HEMATOCRIT, BLOOD      Component Value Range   Hemoglobin 11.6 (*) 13.0 - 17.0 (g/dL)   HCT 57.8 (*) 46.9 - 52.0 (%)  BASIC METABOLIC PANEL      Component Value Range   Sodium 134 (*) 135 - 145 (mEq/L)   Potassium 4.1  3.5 - 5.1 (mEq/L)  Chloride 101  96 - 112 (mEq/L)   CO2 24  19 - 32 (mEq/L)   Glucose, Bld 127 (*) 70 - 99 (mg/dL)   BUN 14  6 - 23 (mg/dL)   Creatinine, Ser 1.61  0.50 - 1.35 (mg/dL)   Calcium 8.6  8.4 - 09.6 (mg/dL)   GFR calc non Af Amer 86 (*) >90 (mL/min)   GFR calc Af Amer >90  >90 (mL/min)    Discharge Medications:   Current Discharge Medication List    START taking these medications   Details  methocarbamol (ROBAXIN) 500 MG tablet Take 1 tablet (500 mg total) by mouth every 6 (six) hours as needed. Qty: 60 tablet, Refills: 1    oxyCODONE-acetaminophen (PERCOCET) 5-325 MG per tablet 1-2 tabs PO q 4-6 hrs prn pain Qty: 60 tablet, Refills: 0      CONTINUE these medications which have NOT CHANGED   Details  atorvastatin (LIPITOR) 40 MG tablet Take 40 mg by mouth daily.     finasteride (PROSCAR) 5 MG tablet Take 5 mg by mouth daily.      propranolol (INDERAL) 10 MG tablet Take 20 mg by mouth at bedtime. Takes two 10 mg tablets  at bedtime     thiothixene (NAVANE) 5 MG capsule Take 5 mg by mouth 3 (three) times daily.          Diagnostic Studies: Dg Chest 2 View  03/02/2011  *RADIOLOGY REPORT*  Clinical Data: Preop  ORIF of left shoulder  CHEST - 2 VIEW  Comparison: 03/01/2011  Findings: The heart size and mediastinal contours are within normal limits. Coarsened interstitial markings are noted bilaterally. Both lungs are clear.  The visualized skeletal structures are unremarkable.  IMPRESSION: No active cardiopulmonary abnormalities peri  Original Report Authenticated By: Rosealee Albee, M.D.   Ct Shoulder Left Wo Contrast  03/01/2011  *RADIOLOGY REPORT*  Clinical Data: Proximal left humerus fracture.  CT OF THE LEFT SHOULDER WITHOUT CONTRAST  Technique:  Multidetector CT imaging was performed according to the standard protocol. Multiplanar CT image reconstructions were also generated.  Comparison: None.  Findings: Incidental visualization of the lungs demonstrates mild paraseptal emphysema.  No displaced acute rib fractures are identified.  Partially visualized cervical spondylosis and facet arthrosis.  Left clavicle is intact.  Scapula shows a small anterior inferior quadrant glenoid rim fracture, with minimal displacement (2 mm).  Large hemarthrosis/shoulder effusion. Inflammatory changes extend minimally into the axilla.  No muscular atrophy of the deltoid or rotator cuff.  There is a multi part fracture of the proximal humerus.  This is a four part fracture with fracture planes extending through the greater and lesser tuberosity is as well as transversely across the proximal humeral metaphysis.  The most significant displacement is of the greater tuberosity which shows about 9 mm superior lateral displacement of the comminuted fracture fragments.  No definite biceps long head tendinous entrapment although the fracture planes do extend through the biceps pulley region. Moderate AC joint osteoarthritis is present.  IMPRESSION: 1.  Comminuted four part fracture of proximal humerus, with 9 mm superior lateral displacement of the greater tuberosity fragments. 2.  Small anterior inferior glenoid fracture, presumably  associated with anterior inferior labral tear.  Original Report Authenticated By: Andreas Newport, M.D.   Hospital course: Pt admitted for ORIF of left proximal humerus. After surgery pt did very well with pain control and PT/OT. Pt afebrile and all labs were negative.   Disposition: Final discharge disposition not confirmed Discharge home  Follow-up Information    Follow up with NORRIS,STEVEN R. Make an appointment in 2 weeks.   Contact information:   [facility] [address] [place] [address] [phone]           Signed: Thea Gist 03/04/2011, 10:53 AM

## 2011-03-04 NOTE — Progress Notes (Signed)
  Orthopedics Progress Note  Subjective: Still having a fair amount of pain, but feeling better.  Ready to go home.  No BM but passing gas.  Tolerating po well.  Objective:  Filed Vitals:   03/04/11 0645  BP: 104/65  Pulse: 69  Temp: 98.9 F (37.2 C)  Resp: 16    General: Awake and alert  Musculoskeletal: Wound CDI, redressed and showed the family.  NVI  ROM with the patient not all that painful. Neurovascularly intact  Lab Results  Component Value Date   WBC 10.2 03/02/2011   HGB 11.6* 03/03/2011   HCT 34.2* 03/03/2011   MCV 88.2 03/02/2011   PLT 171 03/02/2011       Component Value Date/Time   NA 134* 03/03/2011 0000   K 4.1 03/03/2011 0000   CL 101 03/03/2011 0000   CO2 24 03/03/2011 0000   GLUCOSE 127* 03/03/2011 0000   BUN 14 03/03/2011 0000   CREATININE 0.93 03/03/2011 0000   CALCIUM 8.6 03/03/2011 0000   GFRNONAA 86* 03/03/2011 0000   GFRAA >90 03/03/2011 0000    Lab Results  Component Value Date   INR 1.05 03/02/2011    Assessment/Plan: POD #2 s/p Procedure(s): OPEN REDUCTION INTERNAL FIXATION (ORIF) SHOULDER FRACTURE Patient is ready for D/C at this point.  Will need to do daily dry dressing changes...given to family.  No lifting pushing or pulling with the left arm.  Wear sling while up and about.  May use pillow under the arm otherwise.  Exercises every hour.  Almedia Balls. Ranell Patrick, MD 03/04/2011 11:16 AM

## 2011-03-15 NOTE — Anesthesia Postprocedure Evaluation (Signed)
  Anesthesia Post-op Note  Patient: Melvin Foley  Procedure(s) Performed:  OPEN REDUCTION INTERNAL FIXATION (ORIF) SHOULDER FRACTURE - LEFT PROXIMAL HUMERUS Open Reduction internal fixation  Patient Location: PACU  Anesthesia Type: General  Level of Consciousness: awake  Airway and Oxygen Therapy: Patient Spontanous Breathing  Post-op Pain: mild  Post-op Assessment: Post-op Vital signs reviewed  Post-op Vital Signs: stable  Complications: No apparent anesthesia complications

## 2011-03-31 ENCOUNTER — Ambulatory Visit: Payer: Medicare Other | Admitting: Physical Therapy

## 2011-04-04 ENCOUNTER — Ambulatory Visit: Payer: Medicare Other | Admitting: Physical Therapy

## 2012-02-09 ENCOUNTER — Encounter (HOSPITAL_COMMUNITY): Payer: Self-pay | Admitting: Pharmacy Technician

## 2012-02-16 ENCOUNTER — Other Ambulatory Visit: Payer: Self-pay | Admitting: Physician Assistant

## 2012-02-16 NOTE — H&P (Signed)
CC: left shoulder stiffness and pain HPI: 66 y/o male with hx of left proximal humerus fracture with ORIF with continued stiffness and pain after surgery due to adhesions. Pt has elected for surgery to increase rom and decrease pain PMH: hyperlipidemia, sleep apnea, hypertension Surgical: prior left proximal humerus fracture ORIF Allergies: NKDA Meds: aspirin, glucosamine, aleve, thiothixene, lipitor Social: former smoker, former drinker, retired Family: congestive heart failure, hypertension, rheumatoid ROS: pain and decreased rom left shoulder PE: alert and appropriate 66 y/o male in no acute distress Cervical spine shows full rom no tenderness, cranial nerves 2-12 intact Left shoulder: moderately restricted rom, strength 5/5 with ER and IR No rashes or edema X-rays: healed left proximal humerus fracture s/p shoulder nail plate Assessment: frozen shoulder s/p humerus ORIF Plan: shoulder scope and lysis of adhesions to increase rom and decrease pain 

## 2012-02-21 ENCOUNTER — Encounter (HOSPITAL_COMMUNITY): Payer: Self-pay

## 2012-02-21 ENCOUNTER — Encounter (HOSPITAL_COMMUNITY)
Admission: RE | Admit: 2012-02-21 | Discharge: 2012-02-21 | Disposition: A | Discharge status: Home / Self Care | Payer: Non-veteran care | Source: Ambulatory Visit | Attending: Orthopedic Surgery | Admitting: Orthopedic Surgery

## 2012-02-21 HISTORY — DX: Personal history of other diseases of the digestive system: Z87.19

## 2012-02-21 LAB — BASIC METABOLIC PANEL
CO2: 26 mEq/L (ref 19–32)
Calcium: 9.7 mg/dL (ref 8.4–10.5)
Creatinine, Ser: 1.12 mg/dL (ref 0.50–1.35)
GFR calc non Af Amer: 66 mL/min — ABNORMAL LOW (ref >90)
Glucose, Bld: 78 mg/dL (ref 70–99)

## 2012-02-21 LAB — CBC
MCH: 30.6 pg (ref 26.0–34.0)
MCHC: 34.5 g/dL (ref 30.0–36.0)
MCV: 88.7 fL (ref 78.0–100.0)
Platelets: 275 10*3/uL (ref 150–400)
RDW: 12.1 % (ref 11.5–15.5)

## 2012-02-21 LAB — SURGICAL PCR SCREEN: MRSA, PCR: NEGATIVE

## 2012-02-21 NOTE — Pre-Procedure Instructions (Signed)
[address] Salguero Ruffini  02/21/2012   Your procedure is scheduled on:  Friday  02/24/12  Report to [facility] at 530 AM.  Call this number if you have problems the morning of surgery: [phone]   Remember:   Do not eat food OR DRINK :After Midnight.      Take these medicines the morning of surgery with A SIP OF WATER: NAVANE (THIOTHIXENE)    Do not wear jewelry, make-up or nail polish.  Do not wear lotions, powders, or perfumes. You may wear deodorant.  Do not shave 48 hours prior to surgery. Men may shave face and neck.  Do not bring valuables to the hospital.  Contacts, dentures or bridgework may not be worn into surgery.  Leave suitcase in the car. After surgery it may be brought to your room.  For patients admitted to the hospital, checkout time is 11:00 AM the day of discharge.   Patients discharged the day of surgery will not be allowed to drive home.  Name and phone number of your driver:   Special Instructions: Shower using CHG 2 nights before surgery and the night before surgery.  If you shower the day of surgery use CHG.  Use special wash - you have one bottle of CHG for all showers.  You should use approximately 1/3 of the bottle for each shower.   Please read over the following fact sheets that you were given: Pain Booklet, Coughing and Deep Breathing, MRSA Information and Surgical Site Infection Prevention

## 2012-02-21 NOTE — Progress Notes (Signed)
REQUESTED SLEEP STUDY FROM [facility] 

## 2012-02-23 MED ORDER — CEFAZOLIN SODIUM-DEXTROSE 2-3 GM-% IV SOLR
2.0000 g | INTRAVENOUS | Status: AC
  Administered 2012-02-24: 2 g via INTRAVENOUS
  Filled 2012-02-23: qty 50

## 2012-02-24 ENCOUNTER — Ambulatory Visit (HOSPITAL_COMMUNITY): Payer: Non-veteran care | Admitting: Anesthesiology

## 2012-02-24 ENCOUNTER — Observation Stay (HOSPITAL_COMMUNITY): Payer: Non-veteran care

## 2012-02-24 ENCOUNTER — Encounter (HOSPITAL_COMMUNITY): Payer: Self-pay | Admitting: *Deleted

## 2012-02-24 ENCOUNTER — Observation Stay (HOSPITAL_COMMUNITY)
Admission: RE | Admit: 2012-02-24 | Discharge: 2012-02-25 | Disposition: A | Discharge status: Home / Self Care | Payer: Non-veteran care | Source: Ambulatory Visit | Attending: Orthopedic Surgery | Admitting: Orthopedic Surgery

## 2012-02-24 ENCOUNTER — Encounter (HOSPITAL_COMMUNITY): Payer: Self-pay | Admitting: Anesthesiology

## 2012-02-24 ENCOUNTER — Encounter (HOSPITAL_COMMUNITY)
Admission: RE | Disposition: A | Discharge status: Home / Self Care | Payer: Self-pay | Source: Ambulatory Visit | Attending: Orthopedic Surgery

## 2012-02-24 DIAGNOSIS — M7500 Adhesive capsulitis of unspecified shoulder: Principal | ICD-10-CM | POA: Insufficient documentation

## 2012-02-24 DIAGNOSIS — M949 Disorder of cartilage, unspecified: Secondary | ICD-10-CM | POA: Insufficient documentation

## 2012-02-24 DIAGNOSIS — S42209A Unspecified fracture of upper end of unspecified humerus, initial encounter for closed fracture: Secondary | ICD-10-CM

## 2012-02-24 DIAGNOSIS — Z5333 Arthroscopic surgical procedure converted to open procedure: Secondary | ICD-10-CM | POA: Insufficient documentation

## 2012-02-24 DIAGNOSIS — E785 Hyperlipidemia, unspecified: Secondary | ICD-10-CM | POA: Insufficient documentation

## 2012-02-24 DIAGNOSIS — Z01812 Encounter for preprocedural laboratory examination: Secondary | ICD-10-CM | POA: Insufficient documentation

## 2012-02-24 DIAGNOSIS — I10 Essential (primary) hypertension: Secondary | ICD-10-CM | POA: Insufficient documentation

## 2012-02-24 DIAGNOSIS — M24019 Loose body in unspecified shoulder: Secondary | ICD-10-CM | POA: Insufficient documentation

## 2012-02-24 DIAGNOSIS — M24119 Other articular cartilage disorders, unspecified shoulder: Secondary | ICD-10-CM | POA: Insufficient documentation

## 2012-02-24 DIAGNOSIS — M25619 Stiffness of unspecified shoulder, not elsewhere classified: Secondary | ICD-10-CM | POA: Insufficient documentation

## 2012-02-24 DIAGNOSIS — M899 Disorder of bone, unspecified: Secondary | ICD-10-CM | POA: Insufficient documentation

## 2012-02-24 DIAGNOSIS — G4730 Sleep apnea, unspecified: Secondary | ICD-10-CM | POA: Insufficient documentation

## 2012-02-24 HISTORY — PX: SHOULDER ARTHROSCOPY WITH SUBACROMIAL DECOMPRESSION AND BICEP TENDON REPAIR: SHX5689

## 2012-02-24 SURGERY — SHOULDER ARTHROSCOPY WITH SUBACROMIAL DECOMPRESSION AND BICEP TENDON REPAIR
Anesthesia: General | Site: Shoulder | Laterality: Left | Wound class: Clean

## 2012-02-24 MED ORDER — ROCURONIUM BROMIDE 100 MG/10ML IV SOLN
INTRAVENOUS | Status: DC | PRN
  Administered 2012-02-24: 40 mg via INTRAVENOUS

## 2012-02-24 MED ORDER — MENTHOL 3 MG MT LOZG: 1.0000 | LOZENGE | OROMUCOSAL | Status: DC | PRN

## 2012-02-24 MED ORDER — BUPIVACAINE-EPINEPHRINE PF 0.25-1:200000 % IJ SOLN
INTRAMUSCULAR | Status: AC
  Filled 2012-02-24: qty 30

## 2012-02-24 MED ORDER — ACETAMINOPHEN 650 MG RE SUPP: 650.0000 mg | Freq: Four times a day (QID) | RECTAL | Status: DC | PRN

## 2012-02-24 MED ORDER — SODIUM CHLORIDE 0.9 % IV SOLN
INTRAVENOUS | Status: DC
  Administered 2012-02-24: 16:00:00 via INTRAVENOUS

## 2012-02-24 MED ORDER — THIOTHIXENE 5 MG PO CAPS
5.0000 mg | ORAL_CAPSULE | Freq: Three times a day (TID) | ORAL | Status: DC
  Administered 2012-02-24: 5 mg via ORAL
  Filled 2012-02-24 (×5): qty 1

## 2012-02-24 MED ORDER — CHLORHEXIDINE GLUCONATE 4 % EX LIQD: 60.0000 mL | Freq: Once | CUTANEOUS | Status: DC

## 2012-02-24 MED ORDER — OXYCODONE HCL 5 MG/5ML PO SOLN: 5.0000 mg | Freq: Once | ORAL | Status: DC | PRN

## 2012-02-24 MED ORDER — PROMETHAZINE HCL 25 MG/ML IJ SOLN: 6.2500 mg | INTRAMUSCULAR | Status: DC | PRN

## 2012-02-24 MED ORDER — SODIUM CHLORIDE 0.9 % IV SOLN
10.0000 mg | INTRAVENOUS | Status: DC | PRN
  Administered 2012-02-24: 10 ug/min via INTRAVENOUS

## 2012-02-24 MED ORDER — CEFAZOLIN SODIUM-DEXTROSE 2-3 GM-% IV SOLR
2.0000 g | Freq: Four times a day (QID) | INTRAVENOUS | Status: AC
  Administered 2012-02-24 – 2012-02-25 (×3): 2 g via INTRAVENOUS
  Filled 2012-02-24 (×3): qty 50

## 2012-02-24 MED ORDER — METOCLOPRAMIDE HCL 5 MG/ML IJ SOLN: 5.0000 mg | Freq: Three times a day (TID) | INTRAMUSCULAR | Status: DC | PRN

## 2012-02-24 MED ORDER — PHENYLEPHRINE HCL 10 MG/ML IJ SOLN
INTRAMUSCULAR | Status: DC | PRN
  Administered 2012-02-24 (×4): 80 ug via INTRAVENOUS

## 2012-02-24 MED ORDER — ACETAMINOPHEN 325 MG PO TABS: 650.0000 mg | ORAL_TABLET | Freq: Four times a day (QID) | ORAL | Status: DC | PRN

## 2012-02-24 MED ORDER — OXYCODONE-ACETAMINOPHEN 5-325 MG PO TABS: 1.0000 | ORAL_TABLET | ORAL | Status: DC | PRN

## 2012-02-24 MED ORDER — ASPIRIN 325 MG PO TABS
325.0000 mg | ORAL_TABLET | Freq: Every day | ORAL | Status: DC
  Administered 2012-02-24 – 2012-02-25 (×2): 325 mg via ORAL
  Filled 2012-02-24 (×2): qty 1

## 2012-02-24 MED ORDER — SODIUM CHLORIDE 0.9 % IR SOLN
Status: DC | PRN
  Administered 2012-02-24: 6000 mL

## 2012-02-24 MED ORDER — NEOSTIGMINE METHYLSULFATE 1 MG/ML IJ SOLN
INTRAMUSCULAR | Status: DC | PRN
  Administered 2012-02-24: 2 mg via INTRAVENOUS

## 2012-02-24 MED ORDER — MIDAZOLAM HCL 2 MG/2ML IJ SOLN: 1.0000 mg | INTRAMUSCULAR | Status: DC | PRN

## 2012-02-24 MED ORDER — HYDROMORPHONE HCL PF 1 MG/ML IJ SOLN
INTRAMUSCULAR | Status: AC
  Filled 2012-02-24: qty 1

## 2012-02-24 MED ORDER — OXYCODONE HCL 5 MG PO TABS: 5.0000 mg | ORAL_TABLET | Freq: Once | ORAL | Status: DC | PRN

## 2012-02-24 MED ORDER — EPHEDRINE SULFATE 50 MG/ML IJ SOLN
INTRAMUSCULAR | Status: DC | PRN
  Administered 2012-02-24: 5 mg via INTRAVENOUS
  Administered 2012-02-24: 10 mg via INTRAVENOUS
  Administered 2012-02-24: 5 mg via INTRAVENOUS

## 2012-02-24 MED ORDER — BUPIVACAINE-EPINEPHRINE 0.25% -1:200000 IJ SOLN
INTRAMUSCULAR | Status: DC | PRN
  Administered 2012-02-24: 8 mL

## 2012-02-24 MED ORDER — BUPIVACAINE-EPINEPHRINE PF 0.5-1:200000 % IJ SOLN
INTRAMUSCULAR | Status: DC | PRN
  Administered 2012-02-24: 25 mL

## 2012-02-24 MED ORDER — CITRIC ACID-SODIUM CITRATE 334-500 MG/5ML PO SOLN
30.0000 mL | Freq: Once | ORAL | Status: AC
  Administered 2012-02-24: 30 mL via ORAL
  Filled 2012-02-24: qty 30

## 2012-02-24 MED ORDER — LACTATED RINGERS IV SOLN
INTRAVENOUS | Status: DC | PRN
  Administered 2012-02-24 (×2): via INTRAVENOUS

## 2012-02-24 MED ORDER — MIDAZOLAM HCL 5 MG/5ML IJ SOLN
INTRAMUSCULAR | Status: DC | PRN
  Administered 2012-02-24: 2 mg via INTRAVENOUS

## 2012-02-24 MED ORDER — METHOCARBAMOL 500 MG PO TABS: 500.0000 mg | ORAL_TABLET | Freq: Four times a day (QID) | ORAL | Status: DC | PRN

## 2012-02-24 MED ORDER — ONDANSETRON HCL 4 MG/2ML IJ SOLN
INTRAMUSCULAR | Status: DC | PRN
  Administered 2012-02-24: 4 mg via INTRAVENOUS

## 2012-02-24 MED ORDER — FENTANYL CITRATE 0.05 MG/ML IJ SOLN
INTRAMUSCULAR | Status: DC | PRN
  Administered 2012-02-24 (×2): 50 ug via INTRAVENOUS
  Administered 2012-02-24: 100 ug via INTRAVENOUS
  Administered 2012-02-24: 50 ug via INTRAVENOUS

## 2012-02-24 MED ORDER — PHENOL 1.4 % MT LIQD: 1.0000 | OROMUCOSAL | Status: DC | PRN

## 2012-02-24 MED ORDER — ONDANSETRON HCL 4 MG PO TABS: 4.0000 mg | ORAL_TABLET | Freq: Four times a day (QID) | ORAL | Status: DC | PRN

## 2012-02-24 MED ORDER — PROPOFOL 10 MG/ML IV BOLUS
INTRAVENOUS | Status: DC | PRN
  Administered 2012-02-24: 180 mg via INTRAVENOUS

## 2012-02-24 MED ORDER — FENTANYL CITRATE 0.05 MG/ML IJ SOLN: 50.0000 ug | Freq: Once | INTRAMUSCULAR | Status: DC

## 2012-02-24 MED ORDER — KETOROLAC TROMETHAMINE 30 MG/ML IJ SOLN
30.0000 mg | Freq: Once | INTRAMUSCULAR | Status: AC
  Administered 2012-02-24: 30 mg via INTRAVENOUS
  Filled 2012-02-24: qty 1

## 2012-02-24 MED ORDER — DEXTROSE 5 % IV SOLN
500.0000 mg | Freq: Four times a day (QID) | INTRAVENOUS | Status: DC | PRN
  Filled 2012-02-24: qty 5

## 2012-02-24 MED ORDER — METOCLOPRAMIDE HCL 10 MG PO TABS: 5.0000 mg | ORAL_TABLET | Freq: Three times a day (TID) | ORAL | Status: DC | PRN

## 2012-02-24 MED ORDER — GLYCOPYRROLATE 0.2 MG/ML IJ SOLN
INTRAMUSCULAR | Status: DC | PRN
  Administered 2012-02-24: 0.4 mg via INTRAVENOUS

## 2012-02-24 MED ORDER — HYDROMORPHONE HCL PF 1 MG/ML IJ SOLN: 0.5000 mg | INTRAMUSCULAR | Status: DC | PRN

## 2012-02-24 MED ORDER — GEMFIBROZIL 600 MG PO TABS
600.0000 mg | ORAL_TABLET | Freq: Two times a day (BID) | ORAL | Status: DC
  Administered 2012-02-24: 600 mg via ORAL
  Filled 2012-02-24 (×4): qty 1

## 2012-02-24 MED ORDER — DEXAMETHASONE SODIUM PHOSPHATE 4 MG/ML IJ SOLN
INTRAMUSCULAR | Status: DC | PRN
  Administered 2012-02-24: 4 mg

## 2012-02-24 MED ORDER — ONDANSETRON HCL 4 MG/2ML IJ SOLN: 4.0000 mg | Freq: Four times a day (QID) | INTRAMUSCULAR | Status: DC | PRN

## 2012-02-24 MED ORDER — METHOCARBAMOL 500 MG PO TABS: 500.0000 mg | ORAL_TABLET | Freq: Three times a day (TID) | ORAL | Status: DC | PRN

## 2012-02-24 MED ORDER — OXYCODONE-ACETAMINOPHEN 5-325 MG PO TABS
1.0000 | ORAL_TABLET | ORAL | Status: DC | PRN
  Administered 2012-02-25: 1 via ORAL
  Filled 2012-02-24: qty 1

## 2012-02-24 MED ORDER — HYDROMORPHONE HCL PF 1 MG/ML IJ SOLN
0.2500 mg | INTRAMUSCULAR | Status: DC | PRN
  Administered 2012-02-24: 0.5 mg via INTRAVENOUS

## 2012-02-24 SURGICAL SUPPLY — 78 items
ANCH SUT 1.4 1 LD SFT TIS (Anchor) ×2 IMPLANT
ANCHOR JUGGERKNOT SZ1 (Anchor) ×2 IMPLANT
BLADE CUDA 4.2 (BLADE) IMPLANT
BLADE LONG MED 31X9 (MISCELLANEOUS) IMPLANT
BLADE SURG 11 STRL SS (BLADE) ×4 IMPLANT
BUR OVAL 4.0 (BURR) ×2 IMPLANT
CLOTH BEACON ORANGE TIMEOUT ST (SAFETY) ×4 IMPLANT
CLSR STERI-STRIP ANTIMIC 1/2X4 (GAUZE/BANDAGES/DRESSINGS) ×2 IMPLANT
COVER SURGICAL LIGHT HANDLE (MISCELLANEOUS) ×4 IMPLANT
DRAPE INCISE IOBAN 66X45 STRL (DRAPES) ×4 IMPLANT
DRAPE STERI 35X30 U-POUCH (DRAPES) ×4 IMPLANT
DRAPE U-SHAPE 47X51 STRL (DRAPES) ×4 IMPLANT
DRILL BIT 5/64 (BIT) ×1 IMPLANT
DRSG ADAPTIC 3X8 NADH LF (GAUZE/BANDAGES/DRESSINGS) ×2 IMPLANT
DRSG EMULSION OIL 3X3 NADH (GAUZE/BANDAGES/DRESSINGS) ×6 IMPLANT
DRSG PAD ABDOMINAL 8X10 ST (GAUZE/BANDAGES/DRESSINGS) ×6 IMPLANT
DURAPREP 26ML APPLICATOR (WOUND CARE) ×4 IMPLANT
ELECT NDL TIP 2.8 STRL (NEEDLE) ×1 IMPLANT
ELECT NEEDLE TIP 2.8 STRL (NEEDLE) ×3 IMPLANT
ELECT REM PT RETURN 9FT ADLT (ELECTROSURGICAL) ×3
ELECTRODE REM PT RTRN 9FT ADLT (ELECTROSURGICAL) ×1 IMPLANT
GLOVE BIOGEL PI ORTHO PRO 7.5 (GLOVE) ×1
GLOVE BIOGEL PI ORTHO PRO SZ7 (GLOVE) ×1
GLOVE BIOGEL PI ORTHO PRO SZ8 (GLOVE) ×1
GLOVE ORTHO TXT STRL SZ7.5 (GLOVE) ×4 IMPLANT
GLOVE PI ORTHO PRO STRL 7.5 (GLOVE) ×3 IMPLANT
GLOVE PI ORTHO PRO STRL SZ7 (GLOVE) ×1 IMPLANT
GLOVE PI ORTHO PRO STRL SZ8 (GLOVE) ×3 IMPLANT
GLOVE SURG ORTHO 8.5 STRL (GLOVE) ×4 IMPLANT
GLOVE SURG SS PI 7.0 STRL IVOR (GLOVE) ×2 IMPLANT
GOWN STRL NON-REIN LRG LVL3 (GOWN DISPOSABLE) ×4 IMPLANT
GOWN STRL REIN XL XLG (GOWN DISPOSABLE) ×18 IMPLANT
JUGGERKNOT DISPOSABLE KIT ×2 IMPLANT
KIT BASIN OR (CUSTOM PROCEDURE TRAY) ×4 IMPLANT
KIT BIO-TENODESIS 3X8 DISP (MISCELLANEOUS)
KIT INSRT BABSR STRL DISP BTN (MISCELLANEOUS) IMPLANT
KIT JUGGERKNOT DISP 2.9MM (KITS) IMPLANT
KIT ROOM TURNOVER OR (KITS) ×4 IMPLANT
MANIFOLD NEPTUNE II (INSTRUMENTS) ×4 IMPLANT
NDL 1/2 CIR MAYO (NEEDLE) IMPLANT
NDL HYPO 25GX1X1/2 BEV (NEEDLE) ×2 IMPLANT
NDL SPNL 18GX3.5 QUINCKE PK (NEEDLE) ×2 IMPLANT
NDL SUT 6 .5 CRC .975X.05 MAYO (NEEDLE) IMPLANT
NEEDLE 1/2 CIR MAYO (NEEDLE) IMPLANT
NEEDLE HYPO 25GX1X1/2 BEV (NEEDLE) ×3 IMPLANT
NEEDLE MAYO TAPER (NEEDLE)
NEEDLE SPNL 18GX3.5 QUINCKE PK (NEEDLE) ×3 IMPLANT
NS IRRIG 1000ML POUR BTL (IV SOLUTION) ×4 IMPLANT
PACK SHOULDER (CUSTOM PROCEDURE TRAY) ×4 IMPLANT
PAD ARMBOARD 7.5X6 YLW CONV (MISCELLANEOUS) ×6 IMPLANT
RESECTOR FULL RADIUS 4.2MM (BLADE) ×3 IMPLANT
SET ARTHROSCOPY TUBING (MISCELLANEOUS) ×3
SET ARTHROSCOPY TUBING LN (MISCELLANEOUS) ×3 IMPLANT
SLING ARM FOAM STRAP LRG (SOFTGOODS) ×4 IMPLANT
SLING ARM FOAM STRAP MED (SOFTGOODS) IMPLANT
SPONGE GAUZE 4X4 12PLY (GAUZE/BANDAGES/DRESSINGS) ×4 IMPLANT
SPONGE LAP 4X18 X RAY DECT (DISPOSABLE) ×3 IMPLANT
STRIP CLOSURE SKIN 1/2X4 (GAUZE/BANDAGES/DRESSINGS) ×4 IMPLANT
SUCTION FRAZIER TIP 10 FR DISP (SUCTIONS) ×3 IMPLANT
SUT BONE WAX W31G (SUTURE) IMPLANT
SUT FIBERWIRE #2 38 T-5 BLUE (SUTURE) ×3
SUT MNCRL AB 3-0 PS2 18 (SUTURE) ×2 IMPLANT
SUT MNCRL AB 4-0 PS2 18 (SUTURE) ×3 IMPLANT
SUT VIC AB 0 CT1 27 (SUTURE) ×3
SUT VIC AB 0 CT1 27XBRD ANBCTR (SUTURE) ×1 IMPLANT
SUT VIC AB 0 CT2 27 (SUTURE) ×2 IMPLANT
SUT VIC AB 2-0 CT1 27 (SUTURE) ×3
SUT VIC AB 2-0 CT1 TAPERPNT 27 (SUTURE) ×2 IMPLANT
SUT VICRYL 0 CT 1 36IN (SUTURE) ×8 IMPLANT
SUTURE FIBERWR #2 38 T-5 BLUE (SUTURE) ×1 IMPLANT
SYR CONTROL 10ML LL (SYRINGE) ×4 IMPLANT
TAPE CLOTH SURG 6X10 WHT LF (GAUZE/BANDAGES/DRESSINGS) ×2 IMPLANT
TOWEL OR 17X24 6PK STRL BLUE (TOWEL DISPOSABLE) ×4 IMPLANT
TOWEL OR 17X26 10 PK STRL BLUE (TOWEL DISPOSABLE) ×4 IMPLANT
TUBE CONNECTING 12X1/4 (SUCTIONS) ×4 IMPLANT
WAND 90 DEG TURBOVAC W/CORD (SURGICAL WAND) ×4 IMPLANT
WATER STERILE IRR 1000ML POUR (IV SOLUTION) ×4 IMPLANT
YANKAUER SUCT BULB TIP NO VENT (SUCTIONS) ×2 IMPLANT

## 2012-02-24 NOTE — Anesthesia Procedure Notes (Addendum)
Anesthesia Regional Block:  Interscalene brachial plexus block  Pre-Anesthetic Checklist: ,, timeout performed, Correct Patient, Correct Site, Correct Laterality, Correct Procedure, Correct Position, site marked, Risks and benefits discussed,  Surgical consent,  Pre-op evaluation,  At surgeon's request and post-op pain management  Laterality: Left  Prep: chloraprep       Needles:  Injection technique: Single-shot  Needle Type: Echogenic Needle     Needle Length: 5cm 5 cm Needle Gauge: 22 and 22 G    Additional Needles:  Procedures: ultrasound guided (picture in chart) and nerve stimulator Interscalene brachial plexus block  Nerve Stimulator or Paresthesia:  Response: 0.5 mA,   Additional Responses:   Narrative:  Start time: 02/24/2012 7:15 AM End time: 02/24/2012 7:24 AM Injection made incrementally with aspirations every 5 mL. Anesthesiologist: Dr Gypsy Balsam  Additional Notes: 1610-9604 L ISB POP CHG prep, sterile tech #22 stim/echo needle w/stim down to .5ma Good visualization on [place] Marc .5% w/epi 25cc+ decadron 4mg  infil No compl Dr Gypsy Balsam   Procedure Name: Intubation Date/Time: 02/24/2012 8:03 AM Performed by: Sharlene Dory E Pre-anesthesia Checklist: Patient identified, Emergency Drugs available, Suction available, Patient being monitored and Timeout performed Patient Re-evaluated:Patient Re-evaluated prior to inductionOxygen Delivery Method: Circle system utilized Preoxygenation: Pre-oxygenation with 100% oxygen Intubation Type: IV induction Ventilation: Mask ventilation without difficulty Laryngoscope Size: Mac and 3 Grade View: Grade II Tube type: Oral Tube size: 7.5 mm Number of attempts: 1 Airway Equipment and Method: Stylet Placement Confirmation: ETT inserted through vocal cords under direct vision,  positive ETCO2 and breath sounds checked- equal and bilateral Secured at: 21 cm Tube secured with: Tape Dental Injury: Teeth and Oropharynx as per  pre-operative assessment

## 2012-02-24 NOTE — Transfer of Care (Signed)
Immediate Anesthesia Transfer of Care Note  Patient: Melvin Foley  Procedure(s) Performed: Procedure(s) (LRB) with comments: SHOULDER ARTHROSCOPY WITH SUBACROMIAL DECOMPRESSION AND BICEP TENDON REPAIR (Left) - with capsular release and lysis of adhesions  Patient Location: PACU  Anesthesia Type:General  Level of Consciousness: awake, alert  and oriented  Airway & Oxygen Therapy: Patient Spontanous Breathing and Patient connected to nasal cannula oxygen  Post-op Assessment: Report given to PACU RN, Post -op Vital signs reviewed and stable and Patient moving all extremities X 4  Post vital signs: Reviewed and stable  Complications: No apparent anesthesia complications

## 2012-02-24 NOTE — Preoperative (Signed)
Beta Blockers   Reason not to administer Beta Blockers:Not Applicable 

## 2012-02-24 NOTE — H&P (View-Only) (Signed)
CC: left shoulder stiffness and pain HPI: 66 y/o male with hx of left proximal humerus fracture with ORIF with continued stiffness and pain after surgery due to adhesions. Pt has elected for surgery to increase rom and decrease pain PMH: hyperlipidemia, sleep apnea, hypertension Surgical: prior left proximal humerus fracture ORIF Allergies: NKDA Meds: aspirin, glucosamine, aleve, thiothixene, lipitor Social: former smoker, former drinker, retired Family: congestive heart failure, hypertension, rheumatoid ROS: pain and decreased rom left shoulder PE: alert and appropriate 66 y/o male in no acute distress Cervical spine shows full rom no tenderness, cranial nerves 2-12 intact Left shoulder: moderately restricted rom, strength 5/5 with ER and IR No rashes or edema X-rays: healed left proximal humerus fracture s/p shoulder nail plate Assessment: frozen shoulder s/p humerus ORIF Plan: shoulder scope and lysis of adhesions to increase rom and decrease pain

## 2012-02-24 NOTE — Progress Notes (Signed)
Orthopedic Tech Progress Note Patient Details:  Melvin Foley [birth year] [id number]  Patient ID: Melvin Foley, male   DOB: 09-Apr-1946, 66 y.o.   MRN: [id number]   Shawnie Pons 02/24/2012, 4:00 PM Trapeze bar

## 2012-02-24 NOTE — Anesthesia Postprocedure Evaluation (Signed)
  Anesthesia Post-op Note  Patient: Melvin Foley  Procedure(s) Performed: Procedure(s) (LRB) with comments: SHOULDER ARTHROSCOPY WITH SUBACROMIAL DECOMPRESSION AND BICEP TENDON REPAIR (Left) - with capsular release and lysis of adhesions  Patient Location: PACU  Anesthesia Type:GA combined with regional for post-op pain  Level of Consciousness: awake  Airway and Oxygen Therapy: Patient Spontanous Breathing  Post-op Pain: none  Post-op Assessment: Post-op Vital signs reviewed, Patient's Cardiovascular Status Stable, Respiratory Function Stable, Patent Airway, No signs of Nausea or vomiting and Pain level controlled  Post-op Vital Signs: stable  Complications: No apparent anesthesia complications

## 2012-02-24 NOTE — Brief Op Note (Signed)
02/24/2012  10:29 AM  PATIENT:  Darci Current Bugge  66 y.o. male  PRE-OPERATIVE DIAGNOSIS:  LEFT SHOULDER ADHESIVE CAPSULITIS, shoulder pain after ORIF for fracture  POST-OPERATIVE DIAGNOSIS:  LEFT SHOULDER ADHESIVE CAPSULITIS, severe degenerative changes left shoulder, loose bodies, synovitis, labral (SLAP) tear  PROCEDURE:  Procedure(s) (LRB) with comments: SHOULDER ARTHROSCOPY WITH SUBACROMIAL DECOMPRESSION AND BICEP TENDON REPAIR (Left) - with capsular release and lysis of adhesions, removal of loose bodies, biceps tenodesis,  SURGEON:  Surgeon(s) and Role:    * Verlee Rossetti, MD - Primary  PHYSICIAN ASSISTANT:   ASSISTANTS: Donnie Coffin. Dixon, PA-C   ANESTHESIA:   regional and general  EBL:  Total I/O In: 1000 [I.V.:1000] Out: -   BLOOD ADMINISTERED:none  DRAINS: none   LOCAL MEDICATIONS USED:  MARCAINE     SPECIMEN:  No Specimen  DISPOSITION OF SPECIMEN:  N/A  COUNTS:  YES  TOURNIQUET:  * No tourniquets in log *  DICTATION: .Other Dictation: Dictation Number [id number]  PLAN OF CARE: Admit to inpatient   PATIENT DISPOSITION:  PACU - hemodynamically stable.   Delay start of Pharmacological VTE agent (>24hrs) due to surgical blood loss or risk of bleeding: not applicable

## 2012-02-24 NOTE — Anesthesia Preprocedure Evaluation (Signed)
Anesthesia Evaluation  Patient identified by MRN, date of birth, ID band Patient awake    Reviewed: Allergy & Precautions, H&P , NPO status   Airway Mallampati: II TM Distance: >3 FB Neck ROM: Full    Dental  (+) Teeth Intact   Pulmonary shortness of breath and with exertion, sleep apnea ,  breath sounds clear to auscultation        Cardiovascular Rhythm:Regular Rate:Normal     Neuro/Psych Anxiety CVA    GI/Hepatic GERD-  ,  Endo/Other    Renal/GU      Musculoskeletal   Abdominal (+) + obese,   Peds  Hematology   Anesthesia Other Findings   Reproductive/Obstetrics                           Anesthesia Physical Anesthesia Plan  ASA: III  Anesthesia Plan: General   Post-op Pain Management:    Induction: Intravenous  Airway Management Planned: Oral ETT  Additional Equipment:   Intra-op Plan:   Post-operative Plan: Extubation in OR  Informed Consent: I have reviewed the patients History and Physical, chart, labs and discussed the procedure including the risks, benefits and alternatives for the proposed anesthesia with the patient or authorized representative who has indicated his/her understanding and acceptance.     Plan Discussed with: CRNA and Surgeon  Anesthesia Plan Comments:         Anesthesia Quick Evaluation

## 2012-02-24 NOTE — Interval H&P Note (Signed)
History and Physical Interval Note:  02/24/2012 7:44 AM  Melvin Foley  has presented today for surgery, with the diagnosis of LEFT SHOULDER ADHESIVE CAPSULITIS  The various methods of treatment have been discussed with the patient and family. After consideration of risks, benefits and other options for treatment, the patient has consented to  Procedure(s) (LRB) with comments: ARTHROSCOPY SHOULDER (Left) - LEFT SHOULDER ARTHROSCOPY WITH CAPSULAR RELEASE AND POSSIBLE OPEN LYSIS OF ADHESION  GENERAL WITH INTERSCALINE BLOCK as a surgical intervention .  The patient's history has been reviewed, patient examined, no change in status, stable for surgery.  I have reviewed the patient's chart and labs.  Questions were answered to the patient's satisfaction.     Slade Pierpoint,STEVEN R

## 2012-02-25 LAB — BASIC METABOLIC PANEL
BUN: 15 mg/dL (ref 6–23)
CO2: 23 mEq/L (ref 19–32)
Calcium: 8.9 mg/dL (ref 8.4–10.5)
Chloride: 100 mEq/L (ref 96–112)
Creatinine, Ser: 1.03 mg/dL (ref 0.50–1.35)
GFR calc Af Amer: 85 mL/min — ABNORMAL LOW (ref >90)
GFR calc non Af Amer: 73 mL/min — ABNORMAL LOW (ref >90)
Glucose, Bld: 113 mg/dL — ABNORMAL HIGH (ref 70–99)
Potassium: 4.1 mEq/L (ref 3.5–5.1)
Sodium: 138 mEq/L (ref 135–145)

## 2012-02-25 MED ORDER — ALUM & MAG HYDROXIDE-SIMETH 200-200-20 MG/5ML PO SUSP
30.0000 mL | ORAL | Status: DC | PRN
  Administered 2012-02-25: 30 mL via ORAL

## 2012-02-25 NOTE — Progress Notes (Signed)
Orthopedics Progress Note  Subjective: Patient feeling better this AM. Block beginning to wear off.  Objective:  Filed Vitals:   02/25/12 0552  BP: 91/46  Pulse: 66  Temp: 98.3 F (36.8 C)  Resp: 18    General: Awake and alert  Musculoskeletal: L shoulder dressing clean dry and intact.  AAROM FF 140 deg, ER 45 deg smooth ROM Neurovascularly intact  Lab Results  Component Value Date   WBC 7.8 02/21/2012   HGB 12.5* 02/25/2012   HCT 37.1* 02/25/2012   MCV 88.7 02/21/2012   PLT 275 02/21/2012       Component Value Date/Time   NA 138 02/21/2012 1342   K 4.2 02/21/2012 1342   CL 101 02/21/2012 1342   CO2 26 02/21/2012 1342   GLUCOSE 78 02/21/2012 1342   BUN 15 02/21/2012 1342   CREATININE 1.12 02/21/2012 1342   CALCIUM 9.7 02/21/2012 1342   GFRNONAA 66* 02/21/2012 1342   GFRAA 77* 02/21/2012 1342    Lab Results  Component Value Date   INR 1.05 03/02/2011    Assessment/Plan: POD #1s/p Procedure(s): SHOULDER ARTHROSCOPY WITH SUBACROMIAL DECOMPRESSION AND BICEP TENDON REPAIR Looking great this AM. Stressed the importance of continuing aggressive ROM through this week.    PT Monday.  F/U with me Wednesday  Almedia Balls. Ranell Patrick, MD 02/25/2012 8:15 AM

## 2012-02-25 NOTE — Progress Notes (Addendum)
Subjective: 1 Day Post-Op Procedure(s) (LRB): SHOULDER ARTHROSCOPY WITH SUBACROMIAL DECOMPRESSION AND BICEP TENDON REPAIR (Left) Patient reports pain as 3 on 0-10 scale.    Objective: Vital signs in last 24 hours: Temp:  [97.4 F (36.3 C)-98.3 F (36.8 C)] 98.3 F (36.8 C) (11/09 0552) Pulse Rate:  [59-99] 66  (11/09 0552) Resp:  [15-23] 18  (11/09 0552) BP: (91-138)/(46-91) 91/46 mmHg (11/09 0552) SpO2:  [92 %-98 %] 96 % (11/09 0552)  Intake/Output from previous day: 11/08 0701 - 11/09 0700 In: 1690 [P.O.:240; I.V.:1450] Out: 850 [Urine:800; Blood:50] Intake/Output this shift:    No results found for this basename: HGB:5 in the last 72 hours No results found for this basename: WBC:2,RBC:2,HCT:2,PLT:2 in the last 72 hours No results found for this basename: NA:2,K:2,CL:2,CO2:2,BUN:2,CREATININE:2,GLUCOSE:2,CALCIUM:2 in the last 72 hours No results found for this basename: LABPT:2,INR:2 in the last 72 hours  Neurologically intact Neurovascular intact Sensation intact distally Intact pulses distally  Assessment/Plan: 1 Day Post-Op Procedure(s) (LRB): SHOULDER ARTHROSCOPY WITH SUBACROMIAL DECOMPRESSION AND BICEP TENDON REPAIR (Left) Advance diet Up with therapy D/C IV fluids Plan for discharge tomorrow wants to go home today. F/U 10-14 days  Sharna Gabrys C 02/25/2012, 7:48 AM

## 2012-02-25 NOTE — Progress Notes (Signed)
D/C instructions reviewed with patient and friend. RX x 2 given. No hh services or equipment needed. dsg changed prior to d/c. Sling in place. All questions answered. Pt d/c'ed via ambulation in stable condition

## 2012-02-25 NOTE — Op Note (Signed)
NAMEREYAN, HELLE NO.:  [id number]  MEDICAL RECORD NO.:  [id number]  LOCATION:  5N29C                        FACILITY:  [facility]  PHYSICIAN:  Almedia Balls. Ranell Patrick, M.D. DATE OF BIRTH:  1946/04/13  DATE OF PROCEDURE:  02/24/2012 DATE OF DISCHARGE:                              OPERATIVE REPORT   PREOPERATIVE DIAGNOSIS:  Left shoulder stiffness and pain with poor function following shoulder open reduction and internal fixation with SNP nail plate by DePuy.  POSTOPERATIVE DIAGNOSES: 1. Left shoulder adhesive capsulitis. 2. Left shoulder extensive fragmentation and degeneration of the     humeral head and glenoid. 3. Multiple loose bodies. 4. Superior labral tear anterior to posterior and pan-labral tearing     with biceps tearing. 5. Subdeltoid scarring.  PROCEDURE PERFORMED:  Left shoulder exam under anesthesia, shoulder arthroscopy with extensive intra-articular debridement of torn superior labrum, anterior-posterior arthroscopic biceps tenotomy, arthroscopic removal of multiple loose bodies, arthroscopic chondroplasty, arthroscopic capsular release, then arthroscopic subacromial decompression soft tissue only and then open lysis of adhesions.  ATTENDING SURGEON:  Almedia Balls. Ranell Patrick, M.D.  ASSISTANT:  Donnie Coffin. Dixon, PA-C, who scrubbed the entire procedure and necessary for satisfactory completion of surgery.  ANESTHESIA:  General anesthesia was used plus interscalene block.  ESTIMATED BLOOD LOSS:  Less than 200 mL.  FLUID REPLACEMENT:  1500 mL of crystalloid.  INSTRUMENT COUNTS:  Correct.  COMPLICATIONS:  There were no complications.  ANTIBIOTICS:  Perioperative antibiotics were given.  INDICATIONS:  The patient is a 66 year old male with a history of left shoulder fracture, treated with open reduction and internal fixation. The patient went on to heal his fracture, but unfortunately has persistent stiffness and functional limitations and pain.  The  patient's x-rays demonstrate some collapse of the humeral head with prominence of the greater tuberosity, lesser tuberosity.  The patient also has some posterior translation of axillary lateral.  Due to these persistent stiffness, poor function and pain, we elected to proceed with an arthroscopic evaluation of the joint, potential capsular release, subdeltoid scar release.  Informed consent was obtained.  DESCRIPTION OF PROCEDURE:  After adequate level of anesthesia was achieved, the patient was positioned in the modified beach-chair position.  Left shoulder was correctly identified.  Time-out called.  We went ahead and did an exam under anesthesia noting about 10 degrees of fixed internal rotation, contracture cannot get to 0, forward elevation maybe about 70 degrees, abduction approximately 45 degrees.  After exam under anesthesia, we sterilely prepped and draped the shoulder and arm in usual manner.  We entered the shoulder using standard portals including anterior, posterior and lateral portals.  We identified extensive synovitis and tearing of the superior labrum, anterior labrum and a posterior labrum.  There was a tear in the biceps tendon as well. We performed a biceps tenotomy and labral debridement.  Using basket forceps, motorized shaver advanced scarring noted throughout the shoulder, quite a bit of degeneration noted.  There was loose bodies, loose pieces of cartilage with bone on it, areas of head fragmentation. There were some decent areas both on the glenoid and the humeral head, but gets pockmark, almost looking like a moon surface throughout the joint.  We removed these large loose  bodies.  We were able to identify the subscapularis, which was extensively scarred, but we were able to free that up using a combination of ArthroCare unit and shaver, careful not to injure the axillary nerve.  We then did an anterior, inferior and capsular release.  We did posterior capsular  release and debridement posteriorly of the posterior labral tear, and also a large loose body removed out of the back.  It did appear to me that the rotator cuff looked to be intact.  Once we had completed that debridement of the shoulder, the external rotation had improved about maybe 30 degrees, forward elevation to about 130 degrees.  After cleaning out the joint as best we could, assessing the cuff and subscap, we placed the scope in subacromial space, did a bursectomy and scar released up there arthroscopically, did not see a reason to do a bony resection and the rotator cuff actually looked excellent from the bursal side.  We then concluded the arthroscopic portion of the procedure.  We took the patient's prior deltopectoral incision and incised along that in the middle portion of it down to the deltopectoral interval, identified deltoid, took that laterally, pectoralis medially, identified conjoined, freed that up from the subscap and then released the deltoid off the humerus off the plate that we could see we did not feel comfortable taking the plate and the pins out just for fear of having creating a weakness or stress riser to have the whole thing basically collapse and fall apart despite the fact that radiographically looked healed.  We went ahead and released all the subdeltoid scarring, we tenodesed the biceps, mid tension, but the biceps truthfully was pretty stiff and may have been tenodesed already distal to where we tenodesed the tendon, but we did tenodesed with a single Biomet juggernaut suture anchor.  We then ranged the shoulder and felt like the range of motion was significantly improved and a lot smoother, even crunchy and very stiff before and we felt like we had nice smooth motion out to about 45 degrees, external rotation and forward elevation of about 140, crossarm and internal rotation little tight, likely posterior translation of the humeral head and posterior  capsular tightness responsible for that.  So, we felt like this was significantly improved.  We went ahead and thoroughly irrigated and closed in layers with 0 Vicryl followed by 2-0 Vicryl with 4-0 running Monocryl for skin and portals.  Steri-Strips applied followed by sterile dressing.  The patient tolerated the surgery well.     Almedia Balls. Ranell Patrick, M.D.     SRN/MEDQ  D:  02/24/2012  T:  02/25/2012  Job:  [id number]

## 2012-02-25 NOTE — Evaluation (Signed)
Occupational Therapy Evaluation Patient Details Name: DAWAN FARNEY MRN: [id number] DOB: 02-Feb-1946 Today's Date: 02/25/2012 Time: 0865-7846 OT Time Calculation (min): 30 min  OT Assessment / Plan / Recommendation Clinical Impression  Pt s/p SHOULDER ARTHROSCOPY WITH SUBACROMIAL DECOMPRESSION AND BICEP TENDON REPAIR on 02/24/2012. All education complete and pt set up for OPPT upon d/c. No further acute OT needs indicated at this time.    OT Assessment  All further OT needs can be met in the next venue of care    Follow Up Recommendations   (OPPT (already set up))    Barriers to Discharge      Equipment Recommendations  None recommended by OT    Recommendations for Other Services    Frequency       Precautions / Restrictions Precautions Precautions: Shoulder Type of Shoulder Precautions: Limit WBing through Lt arm Precaution Comments: pt able to verbalize I'ly Required Braces or Orthoses:  (shoulder sling when OOB) Restrictions Weight Bearing Restrictions: Yes LUE Weight Bearing:  (limit WBing through LUE)   Pertinent Vitals/Pain Pt reports "some" RUE pain but declined pain meds and did not rate. RN informed    ADL  Upper Body Bathing: Modified independent Where Assessed - Upper Body Bathing: Unsupported sit to stand Lower Body Bathing: Independent Where Assessed - Lower Body Bathing: Unsupported sit to stand Upper Body Dressing: Modified independent Where Assessed - Upper Body Dressing: Unsupported sitting Lower Body Dressing: Minimal assistance Where Assessed - Lower Body Dressing: Unsupported sit to stand Toilet Transfer: Supervision/safety Toilet Transfer Method: Sit to Barista: Regular height toilet Toileting - Clothing Manipulation and Hygiene: Supervision/safety Where Assessed - Engineer, mining and Hygiene: Standing Tub/Shower Transfer: Therapist, sports Method: Land: Walk in Scientist, research (physical sciences) Used:  (LUE sling during OOB activities (excluding exercises)) Transfers/Ambulation Related to ADLs: I with ambulation throughout room; VC to avoid WBing through LUE during sit <-> stand ADL Comments: Pt educated on shoulder AA/ROM exercises, elbow and distally A/ROM, sling management and UB ADL adaptations    OT Diagnosis: Acute pain  OT Problem List: Decreased strength;Decreased range of motion;Decreased activity tolerance;Decreased knowledge of precautions;Pain;Impaired UE functional use OT Treatment Interventions:     OT Goals    Visit Information  Last OT Received On: 02/25/12 Assistance Needed: +1    Subjective Data  Subjective: this nerve block still hasn't worn off yet. Patient Stated Goal: Return home and to helping out at church   Prior Functioning     Home Living Lives With: Spouse Available Help at Discharge: Family;Available 24 hours/day Type of Home: House Bathroom Shower/Tub: Walk-in Stage manager: Standard Home Adaptive Equipment: Built-in shower seat Prior Function Level of Independence: Independent Able to Take Stairs?: Reciprically Driving: Yes Vocation: Agricultural consultant work Comments: volunteers at [employer]: No difficulties Dominant Hand: Right         Vision/Perception     Cognition  Overall Cognitive Status: Appears within functional limits for tasks assessed/performed Arousal/Alertness: Awake/alert Orientation Level: Appears intact for tasks assessed Behavior During Session: [facility] for tasks performed    Extremity/Trunk Assessment Right Upper Extremity Assessment RUE ROM/Strength/Tone: Within functional levels RUE Sensation: WFL - Light Touch RUE Coordination: WFL - gross/fine motor Left Upper Extremity Assessment LUE ROM/Strength/Tone: Unable to fully assess;Due to pain;Due to precautions LUE Sensation:  (reports continued numbness from nerve block) LUE Coordination: WFL  - fine motor (GM- limited by shoulder pain)     Mobility Bed Mobility Bed Mobility: Supine  to Sit;Sit to Supine Supine to Sit: 5: Supervision;HOB flat Sit to Supine: 5: Supervision;HOB flat Details for Bed Mobility Assistance: vc's to limit LUE use      Shoulder Instructions     Exercise Shoulder Exercises Shoulder Flexion: AAROM;10 reps;Left;Supine Shoulder External Rotation: AAROM;Left;10 reps;Supine Elbow Flexion: AROM;10 reps;Seated Wrist Flexion: AROM;10 reps;Seated Digit Composite Flexion: AROM;10 reps;Seated   Balance  Educated pt on proper positioning in bed and OOB; exercises; sling management; ADL adaptations.   End of Session OT - End of Session Equipment Utilized During Treatment: Gait belt Activity Tolerance: Patient tolerated treatment well Patient left: in bed;with call bell/phone within reach;with family/visitor present Nurse Communication: Mobility status  GO Functional Assessment Tool Used: clinical judgment Functional Limitation: Self care Self Care Current Status (N8295): At least 1 percent but less than 20 percent impaired, limited or restricted Self Care Goal Status (A2130): At least 1 percent but less than 20 percent impaired, limited or restricted Self Care Discharge Status [phone]): At least 1 percent but less than 20 percent impaired, limited or restricted   Elesa Garman 02/25/2012, 9:59 AM

## 2012-02-25 NOTE — Discharge Summary (Signed)
Physician Discharge Summary  Patient ID: [name] MRN: [id number] DOB/AGE: [birth year] 66 y.o.  Admit date: 02/24/2012 Discharge date: 02/25/2012  Admission Diagnoses:  Shoulder fracture, frozen shoulder and pain  Discharge Diagnoses:  Same   Surgeries: Procedure(s): SHOULDER ARTHROSCOPY WITH SUBACROMIAL DECOMPRESSION AND BICEP TENDON REPAIR on 02/24/2012   Consultants: OT  Discharged Condition: Stable  Hospital Course: Melvin Foley is an 66 y.o. male who was admitted 02/24/2012 with a chief complaint of shoulder pain, and found to have a diagnosis of shoulder stiffness and pain after shoulder ORIF for fracture.  They were brought to the operating room on 02/24/2012 and underwent the above named procedures.    The patient had an uncomplicated hospital course and was stable for discharge.  Recent vital signs:  Filed Vitals:   02/25/12 0552  BP: 91/46  Pulse: 66  Temp: 98.3 F (36.8 C)  Resp: 18    Recent laboratory studies:  Results for orders placed during the hospital encounter of 02/24/12  HEMOGLOBIN AND HEMATOCRIT, BLOOD      Component Value Range   Hemoglobin 12.5 (*) 13.0 - 17.0 g/dL   HCT 78.2 (*) 95.6 - 21.3 %    Discharge Medications:     Medication List     As of 02/25/2012  8:17 AM    TAKE these medications         aspirin 325 MG tablet   Take 325 mg by mouth daily.      gemfibrozil 600 MG tablet   Commonly known as: LOPID   Take 600 mg by mouth 2 (two) times daily before a meal.      methocarbamol 500 MG tablet   Commonly known as: ROBAXIN   Take 1 tablet (500 mg total) by mouth 3 (three) times daily as needed.      oxyCODONE-acetaminophen 5-325 MG per tablet   Commonly known as: PERCOCET/ROXICET   Take 1-2 tablets by mouth every 4 (four) hours as needed for pain.      thiothixene 5 MG capsule   Commonly known as: NAVANE   Take 5 mg by mouth 3 (three) times daily.        Diagnostic Studies: Dg Shoulder  Left  02/24/2012  *RADIOLOGY REPORT*  Clinical Data: Shoulder debridement  LEFT SHOULDER - 2+ VIEW  Comparison: 03/01/2011  Findings: Metallic hardware is present in the proximal humerus transfixing the previously visualized comminuted fracture.  There is deformity of the proximal humerus with callus formation.  No breakage or loosening of the hardware.  Clavicle and scapula are intact.  IMPRESSION: ORIF humerus fracture with healed deformity.   Original Report Authenticated By: Jolaine Click, M.D.     Disposition: 06-Home-Health Care Svc      Discharge Orders    Future Orders Please Complete By Expires   Diet - low sodium heart healthy      Call MD / Call 911      Comments:   If you experience chest pain or shortness of breath, CALL 911 and be transported to the hospital emergency room.  If you develope a fever above 101 F, pus (white drainage) or increased drainage or redness at the wound, or calf pain, call your surgeon's office.   Constipation Prevention      Comments:   Drink plenty of fluids.  Prune juice may be helpful.  You may use a stool softener, such as Colace (over the counter) 100 mg twice a day.  Use MiraLax (over the counter) for  constipation as needed.   Increase activity slowly as tolerated         Follow-up Information    Follow up with Chena Chohan,STEVEN R, MD. Call in 2 weeks. [phone])    Contact information:   [address] [place] [address] [phone]           Signed: Verlee Rossetti 02/25/2012, 8:17 AM

## 2012-02-27 ENCOUNTER — Encounter (HOSPITAL_COMMUNITY): Payer: Self-pay | Admitting: Orthopedic Surgery

## 2013-09-29 ENCOUNTER — Emergency Department (HOSPITAL_BASED_OUTPATIENT_CLINIC_OR_DEPARTMENT_OTHER)
Admission: EM | Admit: 2013-09-29 | Discharge: 2013-09-29 | Disposition: A | Discharge status: Home / Self Care | Payer: Non-veteran care | Attending: Emergency Medicine | Admitting: Emergency Medicine

## 2013-09-29 ENCOUNTER — Encounter (HOSPITAL_BASED_OUTPATIENT_CLINIC_OR_DEPARTMENT_OTHER): Payer: Self-pay | Admitting: Emergency Medicine

## 2013-09-29 ENCOUNTER — Emergency Department (HOSPITAL_BASED_OUTPATIENT_CLINIC_OR_DEPARTMENT_OTHER): Payer: Non-veteran care

## 2013-09-29 DIAGNOSIS — Z8709 Personal history of other diseases of the respiratory system: Secondary | ICD-10-CM | POA: Diagnosis not present

## 2013-09-29 DIAGNOSIS — F411 Generalized anxiety disorder: Secondary | ICD-10-CM | POA: Insufficient documentation

## 2013-09-29 DIAGNOSIS — R079 Chest pain, unspecified: Secondary | ICD-10-CM | POA: Diagnosis not present

## 2013-09-29 DIAGNOSIS — K219 Gastro-esophageal reflux disease without esophagitis: Secondary | ICD-10-CM | POA: Diagnosis not present

## 2013-09-29 DIAGNOSIS — Z7982 Long term (current) use of aspirin: Secondary | ICD-10-CM | POA: Diagnosis not present

## 2013-09-29 DIAGNOSIS — Z8673 Personal history of transient ischemic attack (TIA), and cerebral infarction without residual deficits: Secondary | ICD-10-CM | POA: Insufficient documentation

## 2013-09-29 DIAGNOSIS — R0602 Shortness of breath: Secondary | ICD-10-CM | POA: Insufficient documentation

## 2013-09-29 DIAGNOSIS — F319 Bipolar disorder, unspecified: Secondary | ICD-10-CM | POA: Insufficient documentation

## 2013-09-29 DIAGNOSIS — Z87891 Personal history of nicotine dependence: Secondary | ICD-10-CM | POA: Insufficient documentation

## 2013-09-29 DIAGNOSIS — Z79899 Other long term (current) drug therapy: Secondary | ICD-10-CM | POA: Insufficient documentation

## 2013-09-29 DIAGNOSIS — R05 Cough: Secondary | ICD-10-CM | POA: Diagnosis not present

## 2013-09-29 DIAGNOSIS — R059 Cough, unspecified: Secondary | ICD-10-CM | POA: Diagnosis not present

## 2013-09-29 HISTORY — DX: Bipolar disorder, unspecified: F31.9

## 2013-09-29 LAB — CBC WITH DIFFERENTIAL/PLATELET
BASOS ABS: 0 10*3/uL (ref 0.0–0.1)
BASOS PCT: 0 % (ref 0–1)
EOS PCT: 4 % (ref 0–5)
Eosinophils Absolute: 0.3 10*3/uL (ref 0.0–0.7)
HEMATOCRIT: 42.9 % (ref 39.0–52.0)
Hemoglobin: 15.1 g/dL (ref 13.0–17.0)
LYMPHS PCT: 26 % (ref 12–46)
Lymphs Abs: 2 10*3/uL (ref 0.7–4.0)
MCH: 31.6 pg (ref 26.0–34.0)
MCHC: 35.2 g/dL (ref 30.0–36.0)
MCV: 89.7 fL (ref 78.0–100.0)
MONO ABS: 0.8 10*3/uL (ref 0.1–1.0)
Monocytes Relative: 11 % (ref 3–12)
NEUTROS ABS: 4.4 10*3/uL (ref 1.7–7.7)
Neutrophils Relative %: 59 % (ref 43–77)
PLATELETS: 271 10*3/uL (ref 150–400)
RBC: 4.78 MIL/uL (ref 4.22–5.81)
RDW: 12.3 % (ref 11.5–15.5)
WBC: 7.5 10*3/uL (ref 4.0–10.5)

## 2013-09-29 LAB — COMPREHENSIVE METABOLIC PANEL
ALBUMIN: 4 g/dL (ref 3.5–5.2)
ALT: 26 U/L (ref 0–53)
AST: 19 U/L (ref 0–37)
Alkaline Phosphatase: 68 U/L (ref 39–117)
BUN: 15 mg/dL (ref 6–23)
CO2: 24 mEq/L (ref 19–32)
CREATININE: 1.2 mg/dL (ref 0.50–1.35)
Calcium: 9.5 mg/dL (ref 8.4–10.5)
Chloride: 103 mEq/L (ref 96–112)
GFR calc Af Amer: 70 mL/min — ABNORMAL LOW (ref >90)
GFR calc non Af Amer: 60 mL/min — ABNORMAL LOW (ref >90)
Glucose, Bld: 89 mg/dL (ref 70–99)
Potassium: 3.9 mEq/L (ref 3.7–5.3)
Sodium: 141 mEq/L (ref 137–147)
TOTAL PROTEIN: 7.2 g/dL (ref 6.0–8.3)
Total Bilirubin: 0.7 mg/dL (ref 0.3–1.2)

## 2013-09-29 LAB — TROPONIN I

## 2013-09-29 NOTE — ED Notes (Signed)
Pt sts his kids made him come to the ED for his SOB.  States he has been SOB for a long time since he has smoked for 46 years.  Sts he is also having some tightness in his low sternal area today.  Has had it in the past.  Never went to doctor about it. No radiation, no n/v.

## 2013-09-29 NOTE — ED Provider Notes (Signed)
CSN: [id number]     Arrival date & time 09/29/13  1530 History  This chart was scribed for Blanchard Kelch, MD by Roxan Diesel, ED scribe.  This patient was seen in room MH01/MH01 and the patient's care was started at 4:24 PM.    Chief Complaint  Patient presents with  . Shortness of Breath     Patient is a 68 y.o. male presenting with shortness of breath. The history is provided by the patient. No language interpreter was used.  Shortness of Breath Severity:  Moderate Duration:  2 days Timing:  Intermittent Chronicity:  Chronic Worsened by:  Exertion Associated symptoms: chest pain and cough   Associated symptoms: no abdominal pain, no fever, no headaches, no neck pain, no rash and no sore throat   Risk factors: tobacco use (former smoker (40 years))    HPI Comments: Melvin Foley is a 68 y.o. male with a history of smoking who presents to the Emergency Department complaining of chronic SOB that has worsened over the past two days. Patient reports experiencing SOB with even light exertion and sometimes at rest over the past two days. Patient smoked for 40 years but quit 15 years ago. Since quitting smoking, patient has intermittently experienced episodes of SOB. He also states that he has been experiencing sporadic chest tightness and chest pain over the last several years and experienced some pain about one hour ago. This tightness is relieved by taking medication Navane for Bipolar disorder. His family members state that Patient has a chronic cough productive of clear sputum, but deny any recent worsening cough.  He denies nausea and vomiting.  He has high cholesterol but denies a history of heart disease, HTN, or DVT.   PCP is at the [place]. He last saw this doctor about one month ago.    Past Medical History  Diagnosis Date  . Shortness of breath   . Bronchitis   . GERD (gastroesophageal reflux disease)   . Anxiety     takes medication  . Sleep apnea     2 YEARS   [place]   . Stroke   . H/O hiatal hernia   . Bipolar 1 disorder    Past Surgical History  Procedure Laterality Date  . Knee surgery      x2  . Head injury    . Tonsillectomy    . Orif shoulder fracture  03/03/2011    Procedure: OPEN REDUCTION INTERNAL FIXATION (ORIF) SHOULDER FRACTURE;  Surgeon: Augustin Schooling;  Location: [facility];  Service: Orthopedics;  Laterality: Left;  LEFT PROXIMAL HUMERUS Open Reduction internal fixation  . Fracture surgery      right ankle  . Cholecystectomy    . Shoulder arthroscopy with subacromial decompression and bicep tendon repair  02/24/2012    Procedure: SHOULDER ARTHROSCOPY WITH SUBACROMIAL DECOMPRESSION AND BICEP TENDON REPAIR;  Surgeon: Augustin Schooling, MD;  Location: [facility];  Service: Orthopedics;  Laterality: Left;  with capsular release and lysis of adhesions   No family history on file. History  Substance Use Topics  . Smoking status: Former Smoker -- 1.50 packs/day  . Smokeless tobacco: Not on file     Comment: stopped smoking 15 years ago  . Alcohol Use: No     Comment: past 20 years ago    Review of Systems  Constitutional: Negative for fever and chills.  HENT: Negative for congestion, rhinorrhea and sore throat.   Eyes: Negative for visual disturbance.  Respiratory: Positive for  cough, chest tightness and shortness of breath.   Cardiovascular: Positive for chest pain. Negative for leg swelling.  Gastrointestinal: Negative for abdominal pain.  Genitourinary: Negative for dysuria.  Musculoskeletal: Negative for back pain and neck pain.  Skin: Negative for rash.  Neurological: Negative for headaches.  Hematological: Does not bruise/bleed easily.  Psychiatric/Behavioral: Negative for confusion.      Allergies  Lipitor  Home Medications   Prior to Admission medications   Medication Sig Start Date End Date Taking? Authorizing Provider  omeprazole (PRILOSEC) 20 MG capsule Take 20 mg by mouth daily.   Yes Historical Provider,  MD  aspirin 325 MG tablet Take 325 mg by mouth daily.    Historical Provider, MD  gemfibrozil (LOPID) 600 MG tablet Take 600 mg by mouth 2 (two) times daily before a meal.    Historical Provider, MD  methocarbamol (ROBAXIN) 500 MG tablet Take 1 tablet (500 mg total) by mouth 3 (three) times daily as needed. 02/24/12   Augustin Schooling, MD  oxyCODONE-acetaminophen (ROXICET) 5-325 MG per tablet Take 1-2 tablets by mouth every 4 (four) hours as needed for pain. 02/24/12   Augustin Schooling, MD  thiothixene (NAVANE) 5 MG capsule Take 5 mg by mouth 3 (three) times daily.      Historical Provider, MD   BP 131/72  Pulse 71  Temp(Src) 98.3 F (36.8 C)  Resp 16  SpO2 97%  Physical Exam  Constitutional: He is oriented to person, place, and time. He appears well-developed and well-nourished. No distress.  HENT:  Head: Normocephalic and atraumatic.  Mouth/Throat: Oropharynx is clear and moist. No oropharyngeal exudate.  Eyes: Conjunctivae and EOM are normal. Pupils are equal, round, and reactive to light.  Neck: Normal range of motion. Neck supple. No tracheal deviation present.  Cardiovascular: Normal rate, regular rhythm and normal heart sounds.  Exam reveals no gallop and no friction rub.   No murmur heard. Pulmonary/Chest: Effort normal and breath sounds normal. No respiratory distress. He has no wheezes. He has no rales.  Abdominal: Soft. Bowel sounds are normal. He exhibits no distension and no mass. There is no tenderness. There is no rebound and no guarding.  Musculoskeletal: Normal range of motion.  Neurological: He is alert and oriented to person, place, and time.  Skin: Skin is warm and dry.  Psychiatric: He has a normal mood and affect. His behavior is normal.    ED Course  Procedures (including critical care time)   DIAGNOSTIC STUDIES: Oxygen Saturation is 97% on room air, normal by my interpretation.     COORDINATION OF CARE: 4:35 PM-Discussed treatment plan which includes CXR,  EKG, and labs with pt at bedside and pt agreed to plan.     Labs Review Labs Reviewed  COMPREHENSIVE METABOLIC PANEL - Abnormal; Notable for the following:    GFR calc non Af Amer 60 (*)    GFR calc Af Amer 70 (*)    All other components within normal limits  CBC WITH DIFFERENTIAL  TROPONIN I    Imaging Review Dg Chest 2 View  09/29/2013   CLINICAL DATA:  Chronic shortness of breath. Recent progression of symptoms.  EXAM: CHEST  2 VIEW  COMPARISON:  Two-view chest 03/02/2011.  FINDINGS: The heart size is normal. Mild emphysematous changes are again noted. No focal airspace disease is evident. There is no edema or effusion to suggest failure. The visualized soft tissues and bony thorax are unremarkable.  IMPRESSION: 1. No acute cardiopulmonary disease. 2. Stable  emphysematous changes.   Electronically Signed   By: Lawrence Santiago M.D.   On: 09/29/2013 16:51     EKG Interpretation   Date/Time:  Sunday September 29 2013 15:37:25 EDT Ventricular Rate:  67 PR Interval:  170 QRS Duration: 88 QT Interval:  370 QTC Calculation: 390 R Axis:   -27 Text Interpretation:  Normal sinus rhythm Normal ECG No significant change  since last tracing Confirmed by Zannie Locastro  MD, Argyle Gustafson ([id number]) on 09/29/2013  3:53:46 PM      MDM   Final diagnoses:  Chest pain  SOB (shortness of breath)    5:25 PM 68 y.o. male w hx of sob, CVA, bipolar presents with shortness of breath and chest tightness. Of note he states that he has chronic shortness of breath with exertion. He also notes intermittent chest tightness at rest and sometimes with exertion of the last few years. He felt like the shortness of breath was slightly worse over the last few days with exertion. He did have some mild chest tightness prior to arrival and yesterday which lasted approximately 10-15 minutes. He denies this on exam currently. He states this is not unusual. He states that he has a chronic cough with white sputum. He has a long history  of smoking but quit approx 15 years ago. FH of heart dis. HLP. He is afebrile and vital signs are unremarkable here. I think his symptoms are likely due to chronic lung disease. His chest pain is not increasing in frequency or atypical for him. It sounds like stable angina.  5:28 PM: I interpreted/reviewed the labs and/or imaging which were non-contributory. Pt asx. Low risk for MACE per HEART score.  I have discussed the diagnosis/risks/treatment options with the patient and family and believe the pt to be eligible for discharge home to follow-up with his pcp this week. We also discussed returning to the ED immediately if new or worsening sx occur. We discussed the sx which are most concerning (e.g., worsening of cp, sob, fever, vomiting) that necessitate immediate return. Medications administered to the patient during their visit and any new prescriptions provided to the patient are listed below.  Medications given during this visit Medications - No data to display  New Prescriptions   No medications on file        I personally performed the services described in this documentation, which was scribed in my presence. The recorded information has been reviewed and is accurate.    Blanchard Kelch, MD 09/29/13 [id number]

## 2013-09-29 NOTE — Discharge Instructions (Signed)
Chest Pain (Nonspecific) °It is often hard to give a specific diagnosis for the cause of chest pain. There is always a chance that your pain could be related to something serious, such as a heart attack or a blood clot in the lungs. You need to follow up with your caregiver for further evaluation. °CAUSES  °· Heartburn. °· Pneumonia or bronchitis. °· Anxiety or stress. °· Inflammation around your heart (pericarditis) or lung (pleuritis or pleurisy). °· A blood clot in the lung. °· A collapsed lung (pneumothorax). It can develop suddenly on its own (spontaneous pneumothorax) or from injury (trauma) to the chest. °· Shingles infection (herpes zoster virus). °The chest wall is composed of bones, muscles, and cartilage. Any of these can be the source of the pain. °· The bones can be bruised by injury. °· The muscles or cartilage can be strained by coughing or overwork. °· The cartilage can be affected by inflammation and become sore (costochondritis). °DIAGNOSIS  °Lab tests or other studies, such as X-rays, electrocardiography, stress testing, or cardiac imaging, may be needed to find the cause of your pain.  °TREATMENT  °· Treatment depends on what may be causing your chest pain. Treatment may include: °· Acid blockers for heartburn. °· Anti-inflammatory medicine. °· Pain medicine for inflammatory conditions. °· Antibiotics if an infection is present. °· You may be advised to change lifestyle habits. This includes stopping smoking and avoiding alcohol, caffeine, and chocolate. °· You may be advised to keep your head raised (elevated) when sleeping. This reduces the chance of acid going backward from your stomach into your esophagus. °· Most of the time, nonspecific chest pain will improve within 2 to 3 days with rest and mild pain medicine. °HOME CARE INSTRUCTIONS  °· If antibiotics were prescribed, take your antibiotics as directed. Finish them even if you start to feel better. °· For the next few days, avoid physical  activities that bring on chest pain. Continue physical activities as directed. °· Do not smoke. °· Avoid drinking alcohol. °· Only take over-the-counter or prescription medicine for pain, discomfort, or fever as directed by your caregiver. °· Follow your caregiver's suggestions for further testing if your chest pain does not go away. °· Keep any follow-up appointments you made. If you do not go to an appointment, you could develop lasting (chronic) problems with pain. If there is any problem keeping an appointment, you must call to reschedule. °SEEK MEDICAL CARE IF:  °· You think you are having problems from the medicine you are taking. Read your medicine instructions carefully. °· Your chest pain does not go away, even after treatment. °· You develop a rash with blisters on your chest. °SEEK IMMEDIATE MEDICAL CARE IF:  °· You have increased chest pain or pain that spreads to your arm, neck, jaw, back, or abdomen. °· You develop shortness of breath, an increasing cough, or you are coughing up blood. °· You have severe back or abdominal pain, feel nauseous, or vomit. °· You develop severe weakness, fainting, or chills. °· You have a fever. °THIS IS AN EMERGENCY. Do not wait to see if the pain will go away. Get medical help at once. Call your local emergency services (911 in [place] Do not drive yourself to the hospital. °MAKE SURE YOU:  °· Understand these instructions. °· Will watch your condition. °· Will get help right away if you are not doing well or get worse. °Document Released: 01/12/2005 Document Revised: 06/27/2011 Document Reviewed: 11/08/2007 °ExitCare® Patient Information ©2014 [employer]   [employer]. ° °

## 2013-09-29 NOTE — ED Notes (Signed)
MD at bedside. 

## 2017-04-30 ENCOUNTER — Emergency Department (HOSPITAL_BASED_OUTPATIENT_CLINIC_OR_DEPARTMENT_OTHER): Payer: Non-veteran care

## 2017-04-30 ENCOUNTER — Other Ambulatory Visit: Payer: Self-pay

## 2017-04-30 ENCOUNTER — Encounter (HOSPITAL_BASED_OUTPATIENT_CLINIC_OR_DEPARTMENT_OTHER): Payer: Self-pay | Admitting: *Deleted

## 2017-04-30 ENCOUNTER — Observation Stay (HOSPITAL_BASED_OUTPATIENT_CLINIC_OR_DEPARTMENT_OTHER)
Admission: EM | Admit: 2017-04-30 | Discharge: 2017-05-01 | Disposition: A | Discharge status: Home / Self Care | Payer: Non-veteran care | Attending: Internal Medicine | Admitting: Internal Medicine

## 2017-04-30 DIAGNOSIS — Z7982 Long term (current) use of aspirin: Secondary | ICD-10-CM | POA: Insufficient documentation

## 2017-04-30 DIAGNOSIS — Z8673 Personal history of transient ischemic attack (TIA), and cerebral infarction without residual deficits: Secondary | ICD-10-CM | POA: Diagnosis not present

## 2017-04-30 DIAGNOSIS — G459 Transient cerebral ischemic attack, unspecified: Principal | ICD-10-CM | POA: Diagnosis present

## 2017-04-30 DIAGNOSIS — E785 Hyperlipidemia, unspecified: Secondary | ICD-10-CM | POA: Diagnosis not present

## 2017-04-30 DIAGNOSIS — R479 Unspecified speech disturbances: Secondary | ICD-10-CM

## 2017-04-30 DIAGNOSIS — Z79899 Other long term (current) drug therapy: Secondary | ICD-10-CM | POA: Diagnosis not present

## 2017-04-30 DIAGNOSIS — G4730 Sleep apnea, unspecified: Secondary | ICD-10-CM | POA: Insufficient documentation

## 2017-04-30 DIAGNOSIS — Z87891 Personal history of nicotine dependence: Secondary | ICD-10-CM | POA: Diagnosis not present

## 2017-04-30 DIAGNOSIS — R51 Headache: Secondary | ICD-10-CM | POA: Diagnosis not present

## 2017-04-30 DIAGNOSIS — R4789 Other speech disturbances: Secondary | ICD-10-CM | POA: Diagnosis present

## 2017-04-30 DIAGNOSIS — R030 Elevated blood-pressure reading, without diagnosis of hypertension: Secondary | ICD-10-CM | POA: Diagnosis not present

## 2017-04-30 LAB — CBC WITH DIFFERENTIAL/PLATELET
Basophils Absolute: 0.1 10*3/uL (ref 0.0–0.1)
Basophils Relative: 1 %
Eosinophils Absolute: 0.5 10*3/uL (ref 0.0–0.7)
Eosinophils Relative: 6 %
HCT: 42 % (ref 39.0–52.0)
Hemoglobin: 14.6 g/dL (ref 13.0–17.0)
LYMPHS ABS: 2.7 10*3/uL (ref 0.7–4.0)
Lymphocytes Relative: 31 %
MCH: 30.9 pg (ref 26.0–34.0)
MCHC: 34.8 g/dL (ref 30.0–36.0)
MCV: 88.8 fL (ref 78.0–100.0)
MONO ABS: 0.9 10*3/uL (ref 0.1–1.0)
MONOS PCT: 10 %
Neutro Abs: 4.6 10*3/uL (ref 1.7–7.7)
Neutrophils Relative %: 52 %
Platelets: 286 10*3/uL (ref 150–400)
RBC: 4.73 MIL/uL (ref 4.22–5.81)
RDW: 12.3 % (ref 11.5–15.5)
WBC: 8.8 10*3/uL (ref 4.0–10.5)

## 2017-04-30 LAB — COMPREHENSIVE METABOLIC PANEL
ALBUMIN: 4 g/dL (ref 3.5–5.0)
ALT: 13 U/L — ABNORMAL LOW (ref 17–63)
AST: 16 U/L (ref 15–41)
Alkaline Phosphatase: 57 U/L (ref 38–126)
Anion gap: 8 (ref 5–15)
BILIRUBIN TOTAL: 0.7 mg/dL (ref 0.3–1.2)
BUN: 14 mg/dL (ref 6–20)
CO2: 26 mmol/L (ref 22–32)
Calcium: 9 mg/dL (ref 8.9–10.3)
Chloride: 105 mmol/L (ref 101–111)
Creatinine, Ser: 1.13 mg/dL (ref 0.61–1.24)
GFR calc non Af Amer: >60 mL/min (ref >60)
GLUCOSE: 71 mg/dL (ref 65–99)
Potassium: 3.6 mmol/L (ref 3.5–5.1)
Sodium: 139 mmol/L (ref 135–145)
Total Protein: 7.4 g/dL (ref 6.5–8.1)

## 2017-04-30 LAB — TROPONIN I: Troponin I: <0.03 ng/mL (ref <0.03)

## 2017-04-30 LAB — CBG MONITORING, ED: Glucose-Capillary: 73 mg/dL (ref 65–99)

## 2017-04-30 LAB — PROTIME-INR
INR: 1.01
Prothrombin Time: 13.2 seconds (ref 11.4–15.2)

## 2017-04-30 MED ORDER — PANTOPRAZOLE SODIUM 40 MG PO TBEC
40.0000 mg | DELAYED_RELEASE_TABLET | Freq: Every day | ORAL | Status: DC
Start: 2017-04-30 — End: 2017-05-01
  Administered 2017-04-30: 40 mg via ORAL
  Filled 2017-04-30: qty 1

## 2017-04-30 MED ORDER — ASPIRIN 325 MG PO TABS: 325.0000 mg | ORAL_TABLET | Freq: Every day | ORAL | Status: DC

## 2017-04-30 MED ORDER — THIOTHIXENE 5 MG PO CAPS
5.0000 mg | ORAL_CAPSULE | Freq: Three times a day (TID) | ORAL | Status: DC
  Administered 2017-05-01 (×3): 5 mg via ORAL
  Filled 2017-04-30 (×4): qty 1

## 2017-04-30 MED ORDER — ACETAMINOPHEN 160 MG/5ML PO SOLN: 650.0000 mg | ORAL | Status: DC | PRN

## 2017-04-30 MED ORDER — CLOPIDOGREL BISULFATE 75 MG PO TABS
75.0000 mg | ORAL_TABLET | Freq: Every day | ORAL | Status: DC
  Administered 2017-04-30: 75 mg via ORAL
  Filled 2017-04-30: qty 1

## 2017-04-30 MED ORDER — ACETAMINOPHEN 325 MG PO TABS
650.0000 mg | ORAL_TABLET | ORAL | Status: DC | PRN
  Administered 2017-05-01 (×2): 650 mg via ORAL
  Filled 2017-04-30 (×2): qty 2

## 2017-04-30 MED ORDER — IBUPROFEN 200 MG PO TABS
800.0000 mg | ORAL_TABLET | Freq: Once | ORAL | Status: AC
  Administered 2017-04-30: 800 mg via ORAL
  Filled 2017-04-30: qty 4

## 2017-04-30 MED ORDER — ENOXAPARIN SODIUM 40 MG/0.4ML ~~LOC~~ SOLN: 40.0000 mg | SUBCUTANEOUS | Status: DC

## 2017-04-30 MED ORDER — GEMFIBROZIL 600 MG PO TABS
600.0000 mg | ORAL_TABLET | Freq: Two times a day (BID) | ORAL | Status: DC
  Administered 2017-05-01 (×3): 600 mg via ORAL
  Filled 2017-04-30 (×4): qty 1

## 2017-04-30 MED ORDER — METHOCARBAMOL 500 MG PO TABS: 500.0000 mg | ORAL_TABLET | Freq: Three times a day (TID) | ORAL | Status: DC | PRN

## 2017-04-30 MED ORDER — STROKE: EARLY STAGES OF RECOVERY BOOK
Freq: Once | Status: AC
  Administered 2017-04-30

## 2017-04-30 MED ORDER — PRAVASTATIN SODIUM 20 MG PO TABS
20.0000 mg | ORAL_TABLET | Freq: Every day | ORAL | Status: DC
  Administered 2017-04-30: 20 mg via ORAL
  Filled 2017-04-30: qty 1

## 2017-04-30 MED ORDER — ASPIRIN 300 MG RE SUPP: 300.0000 mg | Freq: Every day | RECTAL | Status: DC

## 2017-04-30 MED ORDER — ASPIRIN 81 MG PO CHEW
324.0000 mg | CHEWABLE_TABLET | Freq: Once | ORAL | Status: AC
  Administered 2017-04-30: 324 mg via ORAL
  Filled 2017-04-30: qty 4

## 2017-04-30 MED ORDER — OXYCODONE-ACETAMINOPHEN 5-325 MG PO TABS
1.0000 | ORAL_TABLET | ORAL | Status: DC | PRN
Start: 2017-04-30 — End: 2017-04-30

## 2017-04-30 MED ORDER — ACETAMINOPHEN 650 MG RE SUPP: 650.0000 mg | RECTAL | Status: DC | PRN

## 2017-04-30 MED ORDER — SODIUM CHLORIDE 0.9 % IV SOLN
INTRAVENOUS | Status: DC
  Administered 2017-04-30: via INTRAVENOUS

## 2017-04-30 NOTE — ED Notes (Signed)
[facility] notified @ 1727.  Spoke with Maudie Mercury.

## 2017-04-30 NOTE — ED Notes (Signed)
Report to unit attempted. Callback number given.

## 2017-04-30 NOTE — Plan of Care (Addendum)
[facility]  72 year old male with hx of TIA & bipolar. Usually cared for at the [place]. Presented with trouble with word finding and slurred words.   ED Course:  Last known well at 3pm.  Symptoms resolved in the emergency room.  Patient has received aspirin per EDP.  Tele-neurology provided consult.  Advise inpatient admission and workup.  Discussed case with EDP and they will talk to in-house neurology to see if they want a CTA on the patient prior to transfer to [facility].   Status: Obs, Tele  Elwin Mocha, MD

## 2017-04-30 NOTE — ED Notes (Signed)
ED Provider at bedside to assess pt with teleneuro MD

## 2017-04-30 NOTE — Consult Note (Signed)
TeleNeurology Consult Note  Shea Evans Bentley     Consulting Physician: Clabe Seal Code Status: Prior Primary Care Physician:  System, Provider Not In  DOB:  June 19, 1945   Age: 72 y.o.  Date of Service: April 30, 2017 Admit Date:  04/30/2017   Admitting Diagnosis: <principal problem not specified>  Subjective:  Reason for Consultation: stroke  Chief Complaint: transient speech difficulty  History of present illness: 72 y/o man with h/o hyperlipidemia who presents with headache and acute onset expressive aphasia. Last reported well at 1500. Symptoms have resolved. Emergent telestroke consult requested. CT head reviewed and case discussed with ED staff.   Review of Systems  Patient Active Problem List   Diagnosis Date Noted  . Proximal humerus fracture 03/02/2011   Past Medical History:  Diagnosis Date  . Anxiety    takes medication  . Bipolar 1 disorder ([place])   . Bronchitis   . GERD (gastroesophageal reflux disease)   . H/O hiatal hernia   . Shortness of breath   . Sleep apnea    2 YEARS  [place]   . Stroke [facility])    Past Surgical History:  Procedure Laterality Date  . CHOLECYSTECTOMY    . FRACTURE SURGERY     right ankle  . head injury    . KNEE SURGERY     x2  . ORIF SHOULDER FRACTURE  03/03/2011   Procedure: OPEN REDUCTION INTERNAL FIXATION (ORIF) SHOULDER FRACTURE;  Surgeon: Augustin Schooling;  Location: [facility];  Service: Orthopedics;  Laterality: Left;  LEFT PROXIMAL HUMERUS Open Reduction internal fixation  . SHOULDER ARTHROSCOPY WITH SUBACROMIAL DECOMPRESSION AND BICEP TENDON REPAIR  02/24/2012   Procedure: SHOULDER ARTHROSCOPY WITH SUBACROMIAL DECOMPRESSION AND BICEP TENDON REPAIR;  Surgeon: Augustin Schooling, MD;  Location: [facility];  Service: Orthopedics;  Laterality: Left;  with capsular release and lysis of adhesions  . TONSILLECTOMY     Allergies  Allergen Reactions  . Lipitor [Atorvastatin]     BODY ACHE    Social History   Socioeconomic  History  . Marital status: Married    Spouse name: Not on file  . Number of children: Not on file  . Years of education: Not on file  . Highest education level: Not on file  Social Needs  . Financial resource strain: Not on file  . Food insecurity - worry: Not on file  . Food insecurity - inability: Not on file  . Transportation needs - medical: Not on file  . Transportation needs - non-medical: Not on file  Occupational History  . Not on file  Tobacco Use  . Smoking status: Former Smoker    Packs/day: 1.50  . Tobacco comment: stopped smoking 15 years ago  Substance and Sexual Activity  . Alcohol use: No    Comment: past 20 years ago  . Drug use: No  . Sexual activity: Not on file  Other Topics Concern  . Not on file  Social History Narrative  . Not on file   Family History History reviewed. No pertinent family history.    Objective:  Vital signs in last 24 hours: Temp:  [97.7 F (36.5 C)] 97.7 F (36.5 C) (01/13 1717) Pulse Rate:  [67] 67 (01/13 1717) Resp:  [20] 20 (01/13 1717) BP: (155)/(116) 155/116 (01/13 1717) SpO2:  [97 %] 97 % (01/13 1717) Weight:  [79.8 kg (176 lb)] 79.8 kg (176 lb) (01/13 1718) Temp (24hrs), Avg:97.7 F (36.5 C), Min:97.7 F (36.5 C), Max:97.7 F (36.5  C)    Intake/Output last 3 shifts: No intake/output data recorded.  Intake/Output this shift: No intake/output data recorded.  Physical Exam 1a- LOC: Keenly responsive - 0      1b- LOC questions: Answers both questions correctly - 0     1c- LOC commands- Performs both tasks correctly- 0     2- Gaze: Normal; no gaze paresis or gaze deviation - 0     3- Visual Fields: normal, no Visual field deficit - 0     4- Facial movements: no facial palsy - 0     5a- Right Upper limb motor - no drift -0     5b -Left Upper limb motor - no drift -0     6a- Right Lower limb motor - no drift - 0      6b- Left Lower limb motor - no drift - 0 7- Limb Coordination: absent ataxia - 0      8-  Sensory : no sensory loss - 0      9- Language - No aphasia - 0      10- Speech - No dysarthria -0     11- Neglect / Extinction - none found -0     NIHSS score   0  Diagnostic Findings:  Pertinent Labs: No results for input(s): WBC, MCV in the last 168 hours.  Invalid input(s): HEMATOCRIT, HEMOGLOBIN, PLATELETS No results for input(s): POTASSIUM, CHLORIDE, CALCIUM, BUN, GLUCOSE, CREATININE, CO2 in the last 168 hours.  Invalid input(s): SODIUM, ANION GAP2, GFR MDRD AF AMER No results for input(s): AST, ALT, ALBUMIN in the last 168 hours.  Invalid input(s): ALK PHOS, BILIRUBIN TOTAL, BILIRUBIN DIRECT, PROTEIN TOTAL, GLOBULIN No results for input(s): TRIG in the last 168 hours.  Invalid input(s): CHLPL, HDL PML, VLDL CALC, LDL CALC, CHOL/HDL RATIO, LDL/HDL RATIO, NON-HDL CHOLESTEROL Invalid input(s): CK TOTAL, TROPONIN I, CK MB No results for input(s): APTT in the last 168 hours. No results for input(s): INR in the last 168 hours.  Invalid input(s): PROTHROMBIN TIME  Extensive review of previous medical records completed.    Assessment: Patient Active Problem List   Diagnosis Date Noted  . Proximal humerus fracture 03/02/2011     Plan: [facility] TeleNeurology Consult Services  Impression: TIA     Differential Diagnosis:  1. Cardioembolic stroke  2. Small vessel disease/lacune    3. Thromboembolic, artery-to-artery mechanism 4. Hypercoagulable state-related infarct  5. Thrombotic mechanism, large artery disease 6. Transient ischemic attack  Comments: Last known well:   1500 Door time:1720 [facility] contacted:   [phone] [facility] at bedside:   1731 NIHSS assessment time:1733 CT head reviewed at 1737 Needle time:TPA not given since symptoms have resolved (NIHSS 0) Thrombectomy not considered since large vessel occlusion is not suspected     Discussion:     Our recommendations are outlined below.  Recommendations: ASA, IV fluids and permissive  hypertension (Keep SBP < 220/110) Neurochecks DVT prophylaxis    Follow up with Neurology for further testing and evaluation  Medical Decision Making:  - Extensive number of diagnosis or management options are considered above. - Extensive amount of complex data reviewed.  - High risk of complication and/or morbidity or mortality are associated with differential diagnostic considerations above. - There may be uncertain outcome and increased probability of prolonged functional impairment or high probability of severe prolonged functional impairment associated with some of these differential diagnosis.  Medical Data Reviewed: 1.Data reviewed include clinical labs, radiology, Medical Tests; 2.Tests results discussed w/performing or  interpreting physician;    3.Obtaining/reviewing old medical records; 4.Obtaining case history from another source;    5.Independent review of image, tracing or specimen.  Signed:  04/30/2017  5:35 PM

## 2017-04-30 NOTE — ED Notes (Signed)
Tele neuro MD recommendation for pt to admitted for TIA work-up

## 2017-04-30 NOTE — ED Notes (Signed)
TeleNeuro cart placed in room and triage teleneuro  nurse on screen

## 2017-04-30 NOTE — ED Triage Notes (Signed)
Slurred speech, pt anxious, slight word salad.  Reports that he laid down to take a nap around 3 this afternoon.  Woke up with slurred speech about 4pm.  No droop, no drift noted. Pt ambulatory. Reports that his HA is easing off.  Ambulatory.

## 2017-04-30 NOTE — Consult Note (Signed)
Neurology Consultation  Reason for Consult: Transient speech disturbance Referring Physician: Dr. Hal [facility]  CC: Transient speech problem  History is obtained from: Patient, chart  HPI: Melvin Foley is a 72 y.o. male was a past medical history of bipolar disorder, anxiety, hyperlipidemia, chronic pain, TIA 5 years ago at the [place], obstructive sleep apnea reports that he woke up this morning with a headache and his speech did not sound right.  He said that he knew exactly what he wanted to say but the words that were coming out of her mouth did not make any sense.  This episode lasted for about 1-1/2 hours.  He was brought into the emergency room at [facility], where on telemedicine neurology evaluation, his symptom completely resolved.  He was sent over to [facility] for further workup.  No emergent intervention was undertaken due to resolution of symptoms. He denies any preceding illnesses.  Denies any tingling numbness or weakness along with this speech disturbance episode. Denies any fevers chills.  Denies weight loss.  Denies nausea vomiting.  Denies visual problems.  Denies abdominal pain.  Denies chest pain.  Denies palpitations or shortness of breath.   LKW: Last night-04/29/2017 prior to going to bed tpa given?: no, outside window Premorbid modified Rankin scale (mRS): 0  ROS: Extensive review of systems was performed with pertinent positives documented in the HPI.  Rest of the review negative.  Past Medical History:  Diagnosis Date  . Anxiety    takes medication  . Bipolar 1 disorder ([place])   . Bronchitis   . GERD (gastroesophageal reflux disease)   . H/O hiatal hernia   . Shortness of breath   . Sleep apnea    2 YEARS  [place]   . Stroke [facility])     Family History  Problem Relation Age of Onset  . Congestive Heart Failure Mother   . Stroke Mother   . Congestive Heart Failure Father    Social History:   reports that he has quit smoking. He  smoked 1.50 packs per day. he has never used smokeless tobacco. He reports that he does not drink alcohol or use drugs.  Medications  Current Facility-Administered Medications:  .   stroke: mapping our early stages of recovery book, , Does not apply, Once, Rise Patience, MD .  0.9 %  sodium chloride infusion, , Intravenous, Continuous, Rise Patience, MD .  acetaminophen (TYLENOL) tablet 650 mg, 650 mg, Oral, Q4H PRN **OR** acetaminophen (TYLENOL) solution 650 mg, 650 mg, Per Tube, Q4H PRN **OR** acetaminophen (TYLENOL) suppository 650 mg, 650 mg, Rectal, Q4H PRN, Rise Patience, MD .  enoxaparin (LOVENOX) injection 40 mg, 40 mg, Subcutaneous, Q24H, Rise Patience, MD .  gemfibrozil (LOPID) tablet 600 mg, 600 mg, Oral, BID AC, Rise Patience, MD .  methocarbamol (ROBAXIN) tablet 500 mg, 500 mg, Oral, TID PRN, Rise Patience, MD .  oxyCODONE-acetaminophen (PERCOCET/ROXICET) 5-325 MG per tablet 1-2 tablet, 1-2 tablet, Oral, Q4H PRN, Rise Patience, MD .  pantoprazole (PROTONIX) EC tablet 40 mg, 40 mg, Oral, Daily, Gean Birchwood N, MD .  pravastatin (PRAVACHOL) tablet 20 mg, 20 mg, Oral, Daily, Rise Patience, MD .  thiothixene (NAVANE) capsule 5 mg, 5 mg, Oral, TID, Rise Patience, MD   Exam: Current vital signs: BP 136/87 (BP Location: Right Arm)   Pulse (!) 56   Temp 98.2 F (36.8 C) (Oral)   Resp 18   Ht 5'  7" (1.702 m)   Wt 79.7 kg (175 lb 11.2 oz)   SpO2 99%   BMI 27.52 kg/m   Vital signs in last 24 hours: Temp:  [97.7 F (36.5 C)-98.2 F (36.8 C)] 98.2 F (36.8 C) (01/13 2123) Pulse Rate:  [56-67] 56 (01/13 2123) Resp:  [9-20] 18 (01/13 2123) BP: (136-168)/(85-116) 136/87 (01/13 2123) SpO2:  [95 %-100 %] 99 % (01/13 2123) Weight:  [79.7 kg (175 lb 11.2 oz)-79.8 kg (176 lb)] 79.7 kg (175 lb 11.2 oz) (01/13 2123)  GENERAL: Awake, alert in NAD HEENT: - Normocephalic and atraumatic, dry mm, no LN++, no  Thyromegally LUNGS - Clear to auscultation bilaterally with no wheezes CV - S1S2 RRR, no m/r/g, equal pulses bilaterally. ABDOMEN - Soft, nontender, nondistended with normoactive BS Ext: warm, well perfused, intact peripheral pulses, no edema  NEURO:  Mental Status: AA&Ox3  Language: speech is clear.  Naming, repetition, fluency, and comprehension intact. Cranial Nerves: PERRL. EOMI, visual fields full, no facial asymmetry, facial sensation intact, hearing intact, tongue/uvula/soft palate midline, normal sternocleidomastoid and trapezius muscle strength. No evidence of tongue atrophy or fibrillations Motor: 5/5 in all 4 extremities Tone: is normal and bulk is normal Sensation- Intact to light touch bilaterally Coordination: FTN intact bilaterally, no ataxia in BLE Gait- deferred  NIHSS - 0   Labs I have reviewed labs in epic and the results pertinent to this consultation are:  CBC    Component Value Date/Time   WBC 8.8 04/30/2017 1729   RBC 4.73 04/30/2017 1729   HGB 14.6 04/30/2017 1729   HCT 42.0 04/30/2017 1729   PLT 286 04/30/2017 1729   MCV 88.8 04/30/2017 1729   MCH 30.9 04/30/2017 1729   MCHC 34.8 04/30/2017 1729   RDW 12.3 04/30/2017 1729   LYMPHSABS 2.7 04/30/2017 1729   MONOABS 0.9 04/30/2017 1729   EOSABS 0.5 04/30/2017 1729   BASOSABS 0.1 04/30/2017 1729    CMP     Component Value Date/Time   NA 139 04/30/2017 1729   K 3.6 04/30/2017 1729   CL 105 04/30/2017 1729   CO2 26 04/30/2017 1729   GLUCOSE 71 04/30/2017 1729   BUN 14 04/30/2017 1729   CREATININE 1.13 04/30/2017 1729   CALCIUM 9.0 04/30/2017 1729   PROT 7.4 04/30/2017 1729   ALBUMIN 4.0 04/30/2017 1729   AST 16 04/30/2017 1729   ALT 13 (L) 04/30/2017 1729   ALKPHOS 57 04/30/2017 1729   BILITOT 0.7 04/30/2017 1729   GFRNONAA >60 04/30/2017 1729   GFRAA >60 04/30/2017 1729   Imaging I have reviewed the images obtained:  CT-scan of the brain -no acute changes, aspects 10, chronic  microvascular change.  Assessment:  72 year old man past history of bipolar disorder/anxiety/hyperlipidemia/chronic pain/TIA 5 years ago/obstructive sleep apnea presenting with episode consistent with expressive aphasia that was transient lasted about 1-1/2 hours. He woke up with a headache and the speech problems. Differentials to consider are complex migraine versus TIA. I would recommend admission for TIA workup given his risk factors including age.  Impression: Evaluate for TIA/stroke Evaluate for complex migraine  Recommendations: -Admit to hospitalist -Telemetry monitoring -Allow for permissive hypertension for the first 24-48h - only treat PRN if SBP >220 mmHg. Blood pressures can be gradually normalized to SBP<140 upon discharge. -MRI brain without contrast -CT Angiogram of Head and neck -Echocardiogram -HgbA1c, fasting lipid panel -Frequent neuro checks -Prophylactic therapy-Antiplatelet med: Was on aspirin.  Change to Plavix 75 p.o. daily first dose now. -Atorvastatin 80 mg PO  daily -Risk factor modification -I discussed the importance of exercise as well as smoking/alcohol/illicit drug use cessation -PT consult, OT consult, Speech consult -Symptomatic treatment of headache per primary team  Please page stroke NP/PA/MD (listed on AMION)  from 8am-4 pm as this patient will be followed by the stroke team at this point.  -- Amie [place], MD [facility] Pager: [phone] If 7pm to 7am, please call on call as listed on AMION.

## 2017-04-30 NOTE — ED Notes (Signed)
Patient transported to CT 

## 2017-04-30 NOTE — ED Notes (Signed)
ED Provider at bedside. 

## 2017-04-30 NOTE — ED Provider Notes (Signed)
[facility] PROGRESSIVE CARE Provider Note   CSN: [id number] Arrival date & time: 04/30/17  1711     History   Chief Complaint Chief Complaint  Patient presents with  . Altered Mental Status    HPI Melvin Foley is a 72 y.o. male.  HPI   72 year old male with a history of hyperlipidemia, possible CVA diagnosed at the [place], presents with concern for difficulty speaking.  Patient was called a code stroke on arrival.  Last known normal was 3 PM.  Reports he is normal at 3 PM, normal state of health throughout the day, took a nap, upon waking he difficulty speaking.  Denies numbness, weakness, change in vision or other concerns.  Does report he had a headache.  Headache began slowly, and is rated a 5 out of 10.  Denies nausea, vomiting.  Patient reports that the speech problem to him to be difficulty finding words, her family reported that he had significant slurring.   Past Medical History:  Diagnosis Date  . Anxiety    takes medication  . Bipolar 1 disorder ([place])   . Bronchitis   . GERD (gastroesophageal reflux disease)   . H/O hiatal hernia   . Shortness of breath   . Sleep apnea    2 YEARS  Melvin Foley   . Stroke [facility])     Patient Active Problem List   Diagnosis Date Noted  . TIA (transient ischemic attack) 04/30/2017  . Hyperlipidemia 04/30/2017  . Proximal humerus fracture 03/02/2011    Past Surgical History:  Procedure Laterality Date  . CHOLECYSTECTOMY    . FRACTURE SURGERY     right ankle  . head injury    . KNEE SURGERY     x2  . ORIF SHOULDER FRACTURE  03/03/2011   Procedure: OPEN REDUCTION INTERNAL FIXATION (ORIF) SHOULDER FRACTURE;  Surgeon: Augustin Schooling;  Location: [place];  Service: Orthopedics;  Laterality: Left;  LEFT PROXIMAL HUMERUS Open Reduction internal fixation  . SHOULDER ARTHROSCOPY WITH SUBACROMIAL DECOMPRESSION AND BICEP TENDON REPAIR  02/24/2012   Procedure: SHOULDER ARTHROSCOPY WITH SUBACROMIAL DECOMPRESSION AND BICEP TENDON  REPAIR;  Surgeon: Augustin Schooling, MD;  Location: [place];  Service: Orthopedics;  Laterality: Left;  with capsular release and lysis of adhesions  . TONSILLECTOMY         Home Medications    Prior to Admission medications   Medication Sig Start Date End Date Taking? Authorizing Provider  pravastatin (PRAVACHOL) 20 MG tablet Take 20 mg by mouth daily.   Yes [provider]  aspirin 325 MG tablet Take 325 mg by mouth daily.    [provider]  gemfibrozil (LOPID) 600 MG tablet Take 600 mg by mouth 2 (two) times daily before a meal.    [provider]  methocarbamol (ROBAXIN) 500 MG tablet Take 1 tablet (500 mg total) by mouth 3 (three) times daily as needed. 02/24/12   Netta Cedars, MD  omeprazole (PRILOSEC) 20 MG capsule Take 20 mg by mouth daily.    [provider]  oxyCODONE-acetaminophen (ROXICET) 5-325 MG per tablet Take 1-2 tablets by mouth every 4 (four) hours as needed for pain. 02/24/12   Netta Cedars, MD  thiothixene (NAVANE) 5 MG capsule Take 5 mg by mouth 3 (three) times daily.      [provider]    Family History Family History  Problem Relation Age of Onset  . Congestive Heart Failure Mother   . Stroke Mother   .  Congestive Heart Failure Father     Social History Social History   Tobacco Use  . Smoking status: Former Smoker    Packs/day: 1.50  . Smokeless tobacco: Never Used  . Tobacco comment: stopped smoking 15 years ago  Substance Use Topics  . Alcohol use: No    Comment: past 20 years ago  . Drug use: No     Allergies   Lipitor [atorvastatin]   Review of Systems Review of Systems  Constitutional: Negative for fever.  HENT: Negative for sore throat.   Eyes: Negative for visual disturbance.  Respiratory: Negative for shortness of breath.   Cardiovascular: Negative for chest pain.  Gastrointestinal: Negative for abdominal pain, nausea and vomiting.  Genitourinary: Negative for difficulty urinating.    Musculoskeletal: Negative for back pain and neck stiffness.  Skin: Negative for rash.  Neurological: Positive for speech difficulty and headaches. Negative for syncope, facial asymmetry, weakness and numbness.     Physical Exam Updated Vital Signs BP 125/65 (BP Location: Right Arm)   Pulse (!) 55   Temp 98.2 F (36.8 C) (Oral)   Resp 18   Ht 5\' 7"  (1.702 m)   Wt 79.7 kg (175 lb 11.2 oz)   SpO2 99%   BMI 27.52 kg/m   Physical Exam  Constitutional: He is oriented to person, place, and time. He appears well-developed and well-nourished. No distress.  HENT:  Head: Normocephalic and atraumatic.  Eyes: Conjunctivae and EOM are normal.  Neck: Normal range of motion.  Cardiovascular: Normal rate, regular rhythm, normal heart sounds and intact distal pulses. Exam reveals no gallop and no friction rub.  No murmur heard. Pulmonary/Chest: Effort normal and breath sounds normal. No respiratory distress. He has no wheezes. He has no rales.  Abdominal: Soft. He exhibits no distension. There is no tenderness. There is no guarding.  Musculoskeletal: He exhibits no edema.  Neurological: He is alert and oriented to person, place, and time. He has normal strength. He displays no tremor. No cranial nerve deficit or sensory deficit. Coordination normal. GCS eye subscore is 4. GCS verbal subscore is 5. GCS motor subscore is 6.  Initial exam slurring, pausing, stuttering, repeating words  Skin: Skin is warm and dry. He is not diaphoretic.  Nursing note and vitals reviewed.    ED Treatments / Results  Labs (all labs ordered are listed, but only abnormal results are displayed) Labs Reviewed  COMPREHENSIVE METABOLIC PANEL - Abnormal; Notable for the following components:      Result Value   ALT 13 (*)    All other components within normal limits  LIPID PANEL - Abnormal; Notable for the following components:   LDL Cholesterol 108 (*)    All other components within normal limits  COMPREHENSIVE  METABOLIC PANEL - Abnormal; Notable for the following components:   Potassium 3.4 (*)    ALT 15 (*)    All other components within normal limits  CBC WITH DIFFERENTIAL/PLATELET  PROTIME-INR  TROPONIN I  HEMOGLOBIN A1C  CBC  CREATININE, SERUM  CBG MONITORING, ED  CBG MONITORING, ED    EKG  EKG Interpretation  Date/Time:  Sunday April 30 2017 17:23:32 EST Ventricular Rate:  57 PR Interval:    QRS Duration: 87 QT Interval:  379 QTC Calculation: 369 R Axis:   -27 Text Interpretation:  Sinus rhythm Borderline left axis deviation Abnormal R-wave progression, early transition Baseline wander in lead(s) I aVR aVL No significant change since last tracing Confirmed by Gareth Morgan [phone]) on  04/30/2017 5:39:12 PM       Radiology Dg Chest Portable 1 View  Result Date: 04/30/2017 CLINICAL DATA:  72 y/o M; slurred speech and altered mental status. Headache. History of TIA. EXAM: PORTABLE CHEST 1 VIEW COMPARISON:  09/29/2013 chest radiograph FINDINGS: Stable heart size and mediastinal contours are within normal limits. Both lungs are clear. The visualized skeletal structures are unremarkable. IMPRESSION: No active disease. Electronically Signed   By: Kristine Garbe M.D.   On: 04/30/2017 18:07   Ct Head Code Stroke Wo Contrast`  Result Date: 04/30/2017 CLINICAL DATA:  Code stroke.  Slurred speech EXAM: CT HEAD WITHOUT CONTRAST TECHNIQUE: Contiguous axial images were obtained from the base of the skull through the vertex without intravenous contrast. COMPARISON:  None. FINDINGS: Brain: No mass lesion or acute hemorrhage. No focal hypoattenuation of the basal ganglia or cortex to indicate infarcted tissue. No hydrocephalus or age advanced atrophy. There is periventricular hypoattenuation compatible with chronic microvascular disease. Vascular: No hyperdense vessel. No advanced atherosclerotic calcification of the arteries at the skull base. Skull: Normal visualized skull base,  calvarium and extracranial soft tissues. Sinuses/Orbits: No sinus fluid levels or advanced mucosal thickening. No mastoid effusion. Normal orbits. ASPECTS [facility] Stroke Program Early CT Score) - Ganglionic level infarction (caudate, lentiform nuclei, internal capsule, insula, M1-M3 cortex): 7 - Supraganglionic infarction (M4-M6 cortex): 3 Total score (0-10 with 10 being normal): 10 IMPRESSION: 1. No intracranial hemorrhage. 2. ASPECTS is 10. 3. Findings of chronic microvascular ischemia without acute abnormality. These results were called by telephone at the time of interpretation on 04/30/2017 at 5:39 pm to Dr. Gareth Morgan , who verbally acknowledged these results. Electronically Signed   By: Ulyses Jarred M.D.   On: 04/30/2017 17:41    Procedures .Critical Care Performed by: Gareth Morgan, MD Authorized by: Gareth Morgan, MD   Comments:     CRITICAL CARE: code stroke, considered pa, symptoms quickly resolved and suspect TIA Performed by: Tennis Must   Total critical care time: 30 minutes  Critical care time was exclusive of separately billable procedures and treating other patients.  Critical care was necessary to treat or prevent imminent or life-threatening deterioration.  Critical care was time spent personally by me on the following activities: development of treatment plan with patient and/or surrogate as well as nursing, discussions with consultants, evaluation of patient's response to treatment, examination of patient, obtaining history from patient or surrogate, ordering and performing treatments and interventions, ordering and review of laboratory studies, ordering and review of radiographic studies, pulse oximetry and re-evaluation of patient's condition.    (including critical care time)  Medications Ordered in ED Medications  gemfibrozil (LOPID) tablet 600 mg (600 mg Oral Given 05/01/17 0004)  thiothixene (NAVANE) capsule 5 mg (5 mg Oral Given 05/01/17 0005)    0.9 %  sodium chloride infusion ( Intravenous New Bag/Given 04/30/17 2336)  acetaminophen (TYLENOL) tablet 650 mg (not administered)    Or  acetaminophen (TYLENOL) solution 650 mg (not administered)    Or  acetaminophen (TYLENOL) suppository 650 mg (not administered)  pravastatin (PRAVACHOL) tablet 20 mg (not administered)  pantoprazole (PROTONIX) EC tablet 40 mg (not administered)  clopidogrel (PLAVIX) tablet 75 mg (not administered)  enoxaparin (LOVENOX) injection 40 mg (not administered)  aspirin chewable tablet 324 mg (324 mg Oral Given 04/30/17 1931)   stroke: mapping our early stages of recovery book ( Does not apply Given 04/30/17 2336)  ibuprofen (ADVIL,MOTRIN) tablet 800 mg (800 mg Oral Given 04/30/17 2330)  Initial Impression / Assessment and Plan / ED Course  I have reviewed the triage vital signs and the nursing notes.  Pertinent labs & imaging results that were available during my care of the patient were reviewed by me and considered in my medical decision making (see chart for details).     72 year old male with a history of hyperlipidemia, possible CVA diagnosed at the [place], presents with concern for difficulty speaking.  Patient with speech abnormality on arrival and code stroke called.  Patient reports symptoms most consistent with aphasia, however felt to have some dysarthria on my initial eval and per family.  At time of teleneurology exam after CT his symptoms resolved.  Suspect likely TIA.  Hospital service.  Discussed with Dr.  Aletha Halim admit Melvin Foley, neurology will evaluate patient at [facility]. Transferred in stable condition.  Final Clinical Impressions(s) / ED Diagnoses   Final diagnoses:  TIA (transient ischemic attack)  Difficulty with speech    ED Discharge Orders    None       Gareth Morgan, MD 05/01/17 [phone]

## 2017-04-30 NOTE — H&P (Signed)
History and Physical    Melvin Foley ZHG:[id number] DOB: 01-May-1945 DOA: 04/30/2017  PCP: System, Provider Not In  Patient coming from: Home.  Chief Complaint: Difficulty speaking.  HPI: Melvin Foley is a 72 y.o. male with history of previous TIA 5 years ago, sleep apnea, hyperlipidemia was brought to the ER after patient was found to have difficulty speaking.  As per the patient and patient's wife patient woke up around 4:20 PM today and was found to have difficulty bringing out words.  Did not have any weakness of the upper or lower extremity or any visual symptoms or difficulty swallowing.  Since symptoms persisted patient went to the ER and by about an hour patient symptoms are improving.  ED Course: In the ER patient was found to be nonfocal.  CT head was unremarkable.  On-call neurologist was notified and patient admitted for further management of possible TIA.  On my exam patient able to move all extremities.  Patient did state that he had a headache the whole day in the morning which improved.  Review of Systems: As per HPI, rest all negative.   Past Medical History:  Diagnosis Date  . Anxiety    takes medication  . Bipolar 1 disorder ([place])   . Bronchitis   . GERD (gastroesophageal reflux disease)   . H/O hiatal hernia   . Shortness of breath   . Sleep apnea    2 YEARS  [place]   . Stroke [facility])     Past Surgical History:  Procedure Laterality Date  . CHOLECYSTECTOMY    . FRACTURE SURGERY     right ankle  . head injury    . KNEE SURGERY     x2  . ORIF SHOULDER FRACTURE  03/03/2011   Procedure: OPEN REDUCTION INTERNAL FIXATION (ORIF) SHOULDER FRACTURE;  Surgeon: Augustin Schooling;  Location: [place];  Service: Orthopedics;  Laterality: Left;  LEFT PROXIMAL HUMERUS Open Reduction internal fixation  . SHOULDER ARTHROSCOPY WITH SUBACROMIAL DECOMPRESSION AND BICEP TENDON REPAIR  02/24/2012   Procedure: SHOULDER ARTHROSCOPY WITH SUBACROMIAL DECOMPRESSION  AND BICEP TENDON REPAIR;  Surgeon: Augustin Schooling, MD;  Location: [place];  Service: Orthopedics;  Laterality: Left;  with capsular release and lysis of adhesions  . TONSILLECTOMY       reports that he has quit smoking. He smoked 1.50 packs per day. he has never used smokeless tobacco. He reports that he does not drink alcohol or use drugs.  Allergies  Allergen Reactions  . Lipitor [Atorvastatin]     BODY ACHE    Family History  Problem Relation Age of Onset  . Congestive Heart Failure Mother   . Stroke Mother   . Congestive Heart Failure Father     Prior to Admission medications   Medication Sig Start Date End Date Taking? Authorizing Provider  pravastatin (PRAVACHOL) 20 MG tablet Take 20 mg by mouth daily.   Yes [provider]  aspirin 325 MG tablet Take 325 mg by mouth daily.    [provider]  gemfibrozil (LOPID) 600 MG tablet Take 600 mg by mouth 2 (two) times daily before a meal.    [provider]  methocarbamol (ROBAXIN) 500 MG tablet Take 1 tablet (500 mg total) by mouth 3 (three) times daily as needed. 02/24/12   Netta Cedars, MD  omeprazole (PRILOSEC) 20 MG capsule Take 20 mg by mouth daily.    [provider]  oxyCODONE-acetaminophen (ROXICET) 5-325 MG per tablet Take  1-2 tablets by mouth every 4 (four) hours as needed for pain. 02/24/12   Netta Cedars, MD  thiothixene (NAVANE) 5 MG capsule Take 5 mg by mouth 3 (three) times daily.      [provider]    Physical Exam: Vitals:   04/30/17 1945 04/30/17 2000 04/30/17 2023 04/30/17 2123  BP: (!) 160/105 (!) 142/85 (!) 153/102 136/87  Pulse: 62 (!) 58 61 (!) 56  Resp:    18  Temp:    98.2 F (36.8 C)  TempSrc:    Oral  SpO2: 98% 99% 95% 99%  Weight:    79.7 kg (175 lb 11.2 oz)  Height:    5\' 7"  (1.702 m)      Constitutional: Moderately built and nourished. Vitals:   04/30/17 1945 04/30/17 2000 04/30/17 2023 04/30/17 2123  BP: (!) 160/105 (!) 142/85 (!) 153/102  136/87  Pulse: 62 (!) 58 61 (!) 56  Resp:    18  Temp:    98.2 F (36.8 C)  TempSrc:    Oral  SpO2: 98% 99% 95% 99%  Weight:    79.7 kg (175 lb 11.2 oz)  Height:    5\' 7"  (1.702 m)   Eyes: Anicteric no pallor. ENMT: No discharge from the ears eyes nose or mouth. Neck: No mass felt.  No neck rigidity. Respiratory: No rhonchi or crepitations. Cardiovascular: S1-S2 heard no murmurs appreciated. Abdomen: Soft nontender bowel sounds present. Musculoskeletal: No edema.  No joint effusion. Skin: No rash.  Skin appears warm. Neurologic: Alert awake oriented to time place and person.  Moves all extremities 5 x 5.  No facial asymmetry tongue is midline.  Pupils are equal and reacting to light. Psychiatric: Appears normal.   Labs on Admission: I have personally reviewed following labs and imaging studies  CBC: Recent Labs  Lab 04/30/17 1729  WBC 8.8  NEUTROABS 4.6  HGB 14.6  HCT 42.0  MCV 88.8  PLT 063   Basic Metabolic Panel: Recent Labs  Lab 04/30/17 1729  NA 139  K 3.6  CL 105  CO2 26  GLUCOSE 71  BUN 14  CREATININE 1.13  CALCIUM 9.0   GFR: Estimated Creatinine Clearance: 59.8 mL/min (by C-G formula based on SCr of 1.13 mg/dL). Liver Function Tests: Recent Labs  Lab 04/30/17 1729  AST 16  ALT 13*  ALKPHOS 57  BILITOT 0.7  PROT 7.4  ALBUMIN 4.0   No results for input(s): LIPASE, AMYLASE in the last 168 hours. No results for input(s): AMMONIA in the last 168 hours. Coagulation Profile: Recent Labs  Lab 04/30/17 1729  INR 1.01   Cardiac Enzymes: Recent Labs  Lab 04/30/17 1729  TROPONINI <0.03   BNP (last 3 results) No results for input(s): PROBNP in the last 8760 hours. HbA1C: No results for input(s): HGBA1C in the last 72 hours. CBG: Recent Labs  Lab 04/30/17 1716  GLUCAP 73   Lipid Profile: No results for input(s): CHOL, HDL, LDLCALC, TRIG, CHOLHDL, LDLDIRECT in the last 72 hours. Thyroid Function Tests: No results for input(s): TSH,  T4TOTAL, FREET4, T3FREE, THYROIDAB in the last 72 hours. Anemia Panel: No results for input(s): VITAMINB12, FOLATE, FERRITIN, TIBC, IRON, RETICCTPCT in the last 72 hours. Urine analysis: No results found for: COLORURINE, APPEARANCEUR, LABSPEC, PHURINE, GLUCOSEU, HGBUR, BILIRUBINUR, KETONESUR, PROTEINUR, UROBILINOGEN, NITRITE, LEUKOCYTESUR Sepsis Labs: @LABRCNTIP (procalcitonin:4,lacticidven:4) )No results found for this or any previous visit (from the past 240 hour(s)).   Radiological Exams on Admission: Dg Chest Portable 1 View  Result Date: 04/30/2017 CLINICAL DATA:  72 y/o M; slurred speech and altered mental status. Headache. History of TIA. EXAM: PORTABLE CHEST 1 VIEW COMPARISON:  09/29/2013 chest radiograph FINDINGS: Stable heart size and mediastinal contours are within normal limits. Both lungs are clear. The visualized skeletal structures are unremarkable. IMPRESSION: No active disease. Electronically Signed   By: Kristine Garbe M.D.   On: 04/30/2017 18:07   Ct Head Code Stroke Wo Contrast`  Result Date: 04/30/2017 CLINICAL DATA:  Code stroke.  Slurred speech EXAM: CT HEAD WITHOUT CONTRAST TECHNIQUE: Contiguous axial images were obtained from the base of the skull through the vertex without intravenous contrast. COMPARISON:  None. FINDINGS: Brain: No mass lesion or acute hemorrhage. No focal hypoattenuation of the basal ganglia or cortex to indicate infarcted tissue. No hydrocephalus or age advanced atrophy. There is periventricular hypoattenuation compatible with chronic microvascular disease. Vascular: No hyperdense vessel. No advanced atherosclerotic calcification of the arteries at the skull base. Skull: Normal visualized skull base, calvarium and extracranial soft tissues. Sinuses/Orbits: No sinus fluid levels or advanced mucosal thickening. No mastoid effusion. Normal orbits. ASPECTS [facility] Stroke Program Early CT Score) - Ganglionic level infarction (caudate, lentiform  nuclei, internal capsule, insula, M1-M3 cortex): 7 - Supraganglionic infarction (M4-M6 cortex): 3 Total score (0-10 with 10 being normal): 10 IMPRESSION: 1. No intracranial hemorrhage. 2. ASPECTS is 10. 3. Findings of chronic microvascular ischemia without acute abnormality. These results were called by telephone at the time of interpretation on 04/30/2017 at 5:39 pm to Dr. Gareth Morgan , who verbally acknowledged these results. Electronically Signed   By: Ulyses Jarred M.D.   On: 04/30/2017 17:41    EKG: Independently reviewed.  Normal sinus rhythm.  Heart rate around 58 bpm.  Assessment/Plan Principal Problem:   TIA (transient ischemic attack) Active Problems:   Hyperlipidemia    1. TIA -discussed with neurologist Dr. Rory Percy.  At this time neurologist has advised to get CT angiogram of the head and neck MRI brain 2D echo check lipid panel and hemoglobin A1c.  Since patient was already on aspirin has been changed to Plavix.  Patient is on statin. 2. Sleep apnea on CPAP. 3. Hyperlipidemia on statin and gemfibrozil. 4. Elevated blood pressure -closely follow blood pressure trends.  Allow for permissive hypertension. 5. Headache -as had initially headache before the symptoms started.  Presently headache free.  Will check sed rate.   DVT prophylaxis: Lovenox. Code Status: Full code. Family Communication: Discussed with patient's wife at the bedside. Disposition Plan: Home. Consults called: Neurology. Admission status: Observation.   Rise Patience MD [facility] Pager [phone].  If 7PM-7AM, please contact night-coverage [email or web address] Password [facility]  04/30/2017, 10:53 PM

## 2017-04-30 NOTE — Progress Notes (Signed)
RT set up CPAP and placed on patient. Patient tolerating well at this time. RT will monitor as needed. RN aware.

## 2017-05-01 ENCOUNTER — Encounter (HOSPITAL_COMMUNITY): Payer: Self-pay | Admitting: Radiology

## 2017-05-01 ENCOUNTER — Observation Stay (HOSPITAL_COMMUNITY): Payer: Non-veteran care

## 2017-05-01 ENCOUNTER — Observation Stay (HOSPITAL_BASED_OUTPATIENT_CLINIC_OR_DEPARTMENT_OTHER): Payer: Non-veteran care

## 2017-05-01 DIAGNOSIS — I6522 Occlusion and stenosis of left carotid artery: Secondary | ICD-10-CM

## 2017-05-01 DIAGNOSIS — R479 Unspecified speech disturbances: Secondary | ICD-10-CM

## 2017-05-01 DIAGNOSIS — Z87891 Personal history of nicotine dependence: Secondary | ICD-10-CM

## 2017-05-01 DIAGNOSIS — E785 Hyperlipidemia, unspecified: Secondary | ICD-10-CM | POA: Diagnosis not present

## 2017-05-01 DIAGNOSIS — I651 Occlusion and stenosis of basilar artery: Secondary | ICD-10-CM | POA: Diagnosis not present

## 2017-05-01 DIAGNOSIS — G459 Transient cerebral ischemic attack, unspecified: Secondary | ICD-10-CM | POA: Diagnosis not present

## 2017-05-01 LAB — CREATININE, SERUM
Creatinine, Ser: 0.98 mg/dL (ref 0.61–1.24)
GFR calc non Af Amer: >60 mL/min (ref >60)

## 2017-05-01 LAB — CBC
HEMATOCRIT: 41 % (ref 39.0–52.0)
HEMOGLOBIN: 14 g/dL (ref 13.0–17.0)
MCH: 30 pg (ref 26.0–34.0)
MCHC: 34.1 g/dL (ref 30.0–36.0)
MCV: 88 fL (ref 78.0–100.0)
Platelets: 266 10*3/uL (ref 150–400)
RBC: 4.66 MIL/uL (ref 4.22–5.81)
RDW: 12.2 % (ref 11.5–15.5)
WBC: 8.7 10*3/uL (ref 4.0–10.5)

## 2017-05-01 LAB — SEDIMENTATION RATE: SED RATE: 12 mm/h (ref 0–16)

## 2017-05-01 LAB — COMPREHENSIVE METABOLIC PANEL
ALBUMIN: 3.8 g/dL (ref 3.5–5.0)
ALT: 15 U/L — AB (ref 17–63)
ANION GAP: 10 (ref 5–15)
AST: 18 U/L (ref 15–41)
Alkaline Phosphatase: 53 U/L (ref 38–126)
BILIRUBIN TOTAL: 1 mg/dL (ref 0.3–1.2)
BUN: 10 mg/dL (ref 6–20)
CALCIUM: 9 mg/dL (ref 8.9–10.3)
CO2: 23 mmol/L (ref 22–32)
Chloride: 103 mmol/L (ref 101–111)
Creatinine, Ser: 1.02 mg/dL (ref 0.61–1.24)
GLUCOSE: 89 mg/dL (ref 65–99)
Potassium: 3.4 mmol/L — ABNORMAL LOW (ref 3.5–5.1)
SODIUM: 136 mmol/L (ref 135–145)
TOTAL PROTEIN: 6.5 g/dL (ref 6.5–8.1)

## 2017-05-01 LAB — LIPID PANEL
CHOLESTEROL: 172 mg/dL (ref 0–200)
HDL: 42 mg/dL (ref >40)
LDL CALC: 108 mg/dL — AB (ref 0–99)
TRIGLYCERIDES: 109 mg/dL (ref <150)
Total CHOL/HDL Ratio: 4.1 RATIO
VLDL: 22 mg/dL (ref 0–40)

## 2017-05-01 LAB — ECHOCARDIOGRAM COMPLETE
Height: 67 in
Weight: 2811.2 oz

## 2017-05-01 LAB — HEMOGLOBIN A1C
HEMOGLOBIN A1C: 5.1 % (ref 4.8–5.6)
Mean Plasma Glucose: 99.67 mg/dL

## 2017-05-01 MED ORDER — CLOPIDOGREL BISULFATE 75 MG PO TABS: 75.0000 mg | ORAL_TABLET | Freq: Every day | ORAL | Status: DC

## 2017-05-01 MED ORDER — ATORVASTATIN CALCIUM 40 MG PO TABS
40.0000 mg | ORAL_TABLET | Freq: Every day | ORAL | Status: DC
  Administered 2017-05-01: 40 mg via ORAL
  Filled 2017-05-01: qty 1

## 2017-05-01 MED ORDER — PRAVASTATIN SODIUM 20 MG PO TABS: 20.0000 mg | ORAL_TABLET | Freq: Every day | ORAL | Status: DC

## 2017-05-01 MED ORDER — ENOXAPARIN SODIUM 40 MG/0.4ML ~~LOC~~ SOLN: 40.0000 mg | SUBCUTANEOUS | Status: DC

## 2017-05-01 MED ORDER — PANTOPRAZOLE SODIUM 40 MG PO TBEC: 40.0000 mg | DELAYED_RELEASE_TABLET | Freq: Every day | ORAL | Status: DC

## 2017-05-01 MED ORDER — IOPAMIDOL (ISOVUE-370) INJECTION 76%
INTRAVENOUS | Status: AC
  Administered 2017-05-01: 50 mL
  Filled 2017-05-01: qty 50

## 2017-05-01 MED ORDER — CLOPIDOGREL BISULFATE 75 MG PO TABS: 75.0000 mg | ORAL_TABLET | Freq: Every day | ORAL | 0 refills | Status: DC

## 2017-05-01 NOTE — Progress Notes (Signed)
NEUROHOSPITALISTS STROKE TEAM - DAILY PROGRESS NOTE   ADMISSION HISTORY: Melvin Foley is a 72 y.o. male was a past medical history of bipolar disorder, anxiety, hyperlipidemia, chronic pain, TIA 5 years ago at the [place], obstructive sleep apnea reports that he woke up this morning with a headache and his speech did not sound right.  He said that he knew exactly what he wanted to say but the words that were coming out of her mouth did not make any sense.  This episode lasted for about 1-1/2 hours.  He was brought into the emergency room at [facility], where on telemedicine neurology evaluation, his symptom completely resolved.  He was sent over to [facility] for further workup.  No emergent intervention was undertaken due to resolution of symptoms.  He denies any preceding illnesses.  Denies any tingling numbness or weakness along with this speech disturbance episode.  Denies any fevers chills.  Denies weight loss.  Denies nausea vomiting.  Denies visual problems.  Denies abdominal pain.  Denies chest pain.  Denies palpitations or shortness of breath.  LKW: Last night-04/29/2017 prior to going to bed tpa given?: no, outside window Premorbid modified Rankin scale (mRS): 0  SUBJECTIVE (INTERVAL HISTORY) Wife is at the bedside. Patient is found laying in bed in NAD. Overall he feels his condition is completely resolved. Voices no new complaints. Admits to a mild H/A. No new events reported overnight. EEG completed and results pending.   OBJECTIVE Lab Results: CBC:  Recent Labs  Lab 04/30/17 1729 05/01/17 0016  WBC 8.8 8.7  HGB 14.6 14.0  HCT 42.0 41.0  MCV 88.8 88.0  PLT 286 266   BMP: Recent Labs  Lab 04/30/17 1729 05/01/17 0016  NA 139 136  K 3.6 3.4*  CL 105 103  CO2 26 23  GLUCOSE 71 89  BUN 14 10  CREATININE 1.13 1.02  0.98  CALCIUM 9.0 9.0   Liver Function Tests:  Recent Labs  Lab  04/30/17 1729 05/01/17 0016  AST 16 18  ALT 13* 15*  ALKPHOS 57 53  BILITOT 0.7 1.0  PROT 7.4 6.5  ALBUMIN 4.0 3.8   Cardiac Enzymes:  Recent Labs  Lab 04/30/17 1729  TROPONINI <0.03   Coagulation Studies:  Recent Labs    04/30/17 1729  INR 1.01   PHYSICAL EXAM Temp:  [97.7 F (36.5 C)-98.2 F (36.8 C)] 98.2 F (36.8 C) (01/14 0107) Pulse Rate:  [55-67] 55 (01/14 0107) Resp:  [9-20] 18 (01/14 0107) BP: (125-168)/(65-116) 125/65 (01/14 0107) SpO2:  [95 %-100 %] 99 % (01/14 0107) Weight:  [79.7 kg (175 lb 11.2 oz)-79.8 kg (176 lb)] 79.7 kg (175 lb 11.2 oz) (01/13 2123) General - Well nourished, well developed, in no apparent distress HEENT-  Normocephalic,   Cardiovascular - Regular rate and rhythm  Respiratory - Lungs clear bilaterally. No wheezing. Abdomen - soft and non-tender, BS normal Extremities- no edema or cyanosis Mental Status -  Level of arousal and orientation to time, place, and person were intact. Language including expression, naming, repetition, comprehension was assessed and found intact. Attention span and concentration were normal Recent and remote memory were intact Fund of Knowledge was assessed and was intact Cranial Nerves II - XII - II - Visual field intact OU III, IV, VI - Extraocular movements intact. V - Facial sensation intact bilaterally. VII - Facial movement intact bilaterally VIII - Hearing & vestibular intact bilaterally X - Palate elevates symmetrically XI -  Chin turning & shoulder shrug intact bilaterally. XII - Tongue protrusion intact Motor Strength - The patient's strength was normal in all extremities and pronator drift was absent.  Bulk was normal and fasciculations were absent  Motor Tone - Muscle tone was assessed at the neck and appendages and was normal Reflexes - The patient's reflexes were symmetrical in all extremities and he had no pathological reflexes Sensory - Light touch was assessed and was  symmetrical Coordination - The patient had normal movements in the hands and feet with no ataxia or dysmetria.  Tremor was absent Gait and Station - deferred.  IMAGING: I have personally reviewed the radiological images below and agree with the radiology interpretations.  Ct Angio Head/Neck W Or Wo Contrast Result Date: 05/01/2017 IMPRESSION: Atherosclerotic disease of the aortic arch but without aneurysm or dissection. Left vertebral artery originates directly from the arch as the third branch vessel. Atherosclerotic disease at both carotid bifurcations. 20% stenosis of the proximal ICA on the right. 80% stenosis of the proximal ICA on the left. Right vertebral artery terminates in PICA. Left vertebral artery supplies the basilar. 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 In the mid basilar artery, the vessel is very severely stenotic. Patent left posterior communicating artery. Flow present in the small distal vertebral artery and supplying both superior cerebellar and posterior cerebral arteries, suspected to derive from flow through the severely stenotic basilar artery and through the left posterior communicating artery, with the caveat that there is a severe stenosis the left P1 segment which would hinder the contribution from that route. Electronically Signed   By: Nelson Chimes M.D.   On: 05/01/2017 10:01   Mr Brain Wo Contrast Result Date: 05/01/2017 IMPRESSION: No acute finding by MR. Advanced chronic small-vessel ischemic changes throughout the brain. 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 Electronically Signed   By: Nelson Chimes M.D.   On: 05/01/2017 09:01   Ct Head Code Stroke Wo Contrast` Result Date: 04/30/2017 IMPRESSION: 1. No intracranial hemorrhage. 2. ASPECTS is 10. 3. Findings of chronic microvascular ischemia without acute abnormality.   Echocardiogram:                                               Study Conclusions - Left ventricle: The cavity  size was normal. Systolic function was   normal. The estimated ejection fraction was in the range of 55%   to 60%. Wall motion was normal; there were no regional wall   motion abnormalities. Doppler parameters are consistent with   abnormal left ventricular relaxation (grade 1 diastolic   dysfunction). Doppler parameters are consistent with   indeterminate mean left atrial filling pressure.  EEG:         PENDING                                                                 IMPRESSION: [name] is a 72 y.o. male with PMH of  bipolar disorder, anxiety, HLD, chronic pain, TIA 5 years ago at the [place], OSA w/[facility] that presents with acute onset dysarthria. MRI negative for stroke  Likely TIA Suspected  Etiology: small vessel disease with Left ICA stenosis Resultant Symptoms: Dysarthria Stroke Risk Factors: hyperlipidemia Other Stroke Risk Factors: Advanced age, Hx stroke, OSA w/CPCP  Outstanding Stroke Work-up Studies:     Vascular Surgery consultation EEG Results  PLAN  05/01/2017: Continue Aspirin/ Plavix/Statin Vascular Surgery consultation Frequent neuro checks Telemetry monitoring PT/OT/SLP Ongoing aggressive stroke risk factor management Patient counseled to be compliant with his antithrombotic medications Patient counseled on Lifestyle modifications including, Diet, Exercise, and Stress Follow up with [facility] in 6 weeks  Headache Does not appear to be a Migraine Headache Continue Tylenol PRN for now  HX OF STROKES: 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  INTRACRANIAL Atherosclerosis &Stenosis: Started on DAPT, continue for 3 months and then Plavix alone  HYPERTENSION: Stable, some elevated B/P's noted overnight May slowly start  B/P medications, if indicated Home Meds: NONE  HYPERLIPIDEMIA:    Component Value Date/Time   CHOL 172 05/01/2017 0016   TRIG 109 05/01/2017 0016   HDL 42 05/01/2017 0016    CHOLHDL 4.1 05/01/2017 0016   VLDL 22 05/01/2017 0016   LDLCALC 108 (H) 05/01/2017 0016  Home Meds:  Pravachol 20 mg LDL  goal < 70 Changed to Lipitor to 40 mg daily Continue statin at discharge  R/O DIABETES: Lab Results  Component Value Date   HGBA1C 5.1 05/01/2017  HgbA1c goal < 7.0  Other Active Problems: Principal Problem:   TIA (transient ischemic attack) Active Problems:   Hyperlipidemia    Hospital day # 0 VTE prophylaxis: Lovenox  Diet : Diet Heart Room service appropriate? Yes; Fluid consistency: Thin   FAMILY UPDATES: No family at bedside  TEAM UPDATES: Geradine Girt, DO   Prior Home Stroke Medications:  aspirin 81 mg daily  Discharge Stroke Meds:  Please discharge patient on aspirin 325 mg daily and clopidogrel 75 mg daily   Disposition: 01-Home or Self Care Therapy Recs:               PENDING Home Equipment:         PENDING Follow Up:  Follow-up Information    Dennie Bible, NP. Schedule an appointment as soon as possible for a visit in 6 week(s).   Specialty:  Family Medicine Contact information: [address] [place] [address] [phone]          System, Provider Not In -PCP Follow up in 1-2 weeks at [place] discussed with with attending physician and they are in agreement.    Renie Ora Stroke Neurology Team 05/01/2017 2:43 PM  ATTENDING NOTE: I reviewed above note and agree with the assessment and plan. I have made any additions or clarifications directly to the above note. Pt was seen and examined.   72 year old male with history of HLD, OSA, TIA presented with headache and word finding difficulties, symptoms lasted about 1 hour.  MRI showed no acute infarct.  However CTA head and neck showed left ICA high-grade stenosis and basilar artery segmental severe stenosis.  EEG normal.  EF 55-60%.  LDL 108 and A1c 5.1.  UDS not checked.  Patient symptoms most likely due to left ICA high-grade  stenosis with left brain TIA.  Basal artery stenosis was asymptomatic at this time.  He is on aspirin and pravastatin PTA, will add Plavix into the regimen for dual antiplatelet therapy.  Continue aspirin and Plavix for 3 months and then Plavix alone.  Continue pravastatin and Lopid.  Vascular surgery consulted, recommend left CEA on 05/03/17.  BP goal 130-150 before procedure.  Neurology will sign off. Please call with questions. Pt will follow up with Cecille Rubin, NP, at [facility] in about 6 weeks. Thanks for the consult.  Rosalin Hawking, MD PhD Stroke Neurology 05/01/2017 11:19 PM  To contact Stroke Continuity provider, please refer to [email or web address] After hours, contact General Neurology

## 2017-05-01 NOTE — Care Management Obs Status (Signed)
[facility] NOTIFICATION   Patient Details  Name: Melvin Foley MRN: [id number] Date of Birth: [birth year]   Medicare Observation Status Notification Given:  Yes    Pollie Friar, RN 05/01/2017, 2:53 PM

## 2017-05-01 NOTE — Progress Notes (Signed)
Pt left floor @0730  for imaging

## 2017-05-01 NOTE — Progress Notes (Signed)
PROGRESS NOTE    Melvin Foley  NLZ:[id number] DOB: 13-Oct-1945 DOA: 04/30/2017 PCP: System, Provider Not In   Outpatient Specialists:     Brief Narrative:  Melvin Foley is a 72 y.o. male with history of previous TIA 5 years ago, sleep apnea, hyperlipidemia was brought to the ER after patient was found to have difficulty speaking.  As per the patient and patient's wife patient woke up around 4:20 PM today and was found to have difficulty bringing out words.  Did not have any weakness of the upper or lower extremity or any visual symptoms or difficulty swallowing.  Since symptoms persisted patient went to the ER and by about an hour patient symptoms are improving.  ED Course: In the ER patient was found to be nonfocal.  CT head was unremarkable.  On-call neurologist was notified and patient admitted for further management of possible TIA.  On my exam patient able to move all extremities.  Patient did state that he had a headache the whole day in the morning which improved.     Assessment & Plan:   Principal Problem:   TIA (transient ischemic attack) Active Problems:   Hyperlipidemia   Difficulty with speech   TIA  -MRI- no new changes CTA-Atherosclerotic disease of the aortic arch but without aneurysm or dissection. Left vertebral artery originates directly from the arch as the third branch vessel.  Atherosclerotic disease at both carotid bifurcations. 20% stenosis of the proximal ICA on the right. 80% stenosis of the proximal ICA on the left.  Right vertebral artery terminates in PICA. Left vertebral artery supplies the basilar. 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 In the mid basilar artery, the vessel is very severely stenotic. Patent left posterior communicating artery. Flow present in the small distal vertebral artery and supplying both superior cerebellar and posterior cerebral arteries, suspected to derive from flow through the  severely stenotic basilar artery and through the left posterior communicating artery, with the caveat that there is a severe stenosis the left P1 segment which would hinder the contribution from that route.  -neuro consult -vascular consult -statin -PT- no needs -EEG negative  Sleep apnea on CPAP.  Hyperlipidemia on statin and gemfibrozil.  Elevated blood pressure -closely follow blood pressure trends.  Allow for permissive hypertension.  Headache -as had initially headache before the symptoms started.  Presently headache free.  sed rate normal      DVT prophylaxis:  Lovenox   Code Status: Full Code   Family Communication: wife  Disposition Plan:  Home later today  Subjective: Asking for shower  Objective: Vitals:   04/30/17 2023 04/30/17 2123 04/30/17 2300 05/01/17 0107  BP: (!) 153/102 136/87  125/65  Pulse: 61 (!) 56 (!) 59 (!) 55  Resp:  18 18 18   Temp:  98.2 F (36.8 C)  98.2 F (36.8 C)  TempSrc:  Oral  Oral  SpO2: 95% 99% 98% 99%  Weight:  79.7 kg (175 lb 11.2 oz)    Height:  5\' 7"  (1.702 m)     No intake or output data in the 24 hours ending 05/01/17 1532 Filed Weights   04/30/17 1718 04/30/17 2123  Weight: 79.8 kg (176 lb) 79.7 kg (175 lb 11.2 oz)    Examination:  General exam: Appears calm and comfortable       Data Reviewed: I have personally reviewed following labs and imaging studies  CBC: Recent Labs  Lab 04/30/17 1729 05/01/17 0016  WBC 8.8 8.7  NEUTROABS 4.6  --   HGB 14.6 14.0  HCT 42.0 41.0  MCV 88.8 88.0  PLT 286 409   Basic Metabolic Panel: Recent Labs  Lab 04/30/17 1729 05/01/17 0016  NA 139 136  K 3.6 3.4*  CL 105 103  CO2 26 23  GLUCOSE 71 89  BUN 14 10  CREATININE 1.13 1.02  0.98  CALCIUM 9.0 9.0   GFR: Estimated Creatinine Clearance: 68.9 mL/min (by C-G formula based on SCr of 0.98 mg/dL). Liver Function Tests: Recent Labs  Lab 04/30/17 1729 05/01/17 0016  AST 16 18  ALT 13* 15*  ALKPHOS  57 53  BILITOT 0.7 1.0  PROT 7.4 6.5  ALBUMIN 4.0 3.8   No results for input(s): LIPASE, AMYLASE in the last 168 hours. No results for input(s): AMMONIA in the last 168 hours. Coagulation Profile: Recent Labs  Lab 04/30/17 1729  INR 1.01   Cardiac Enzymes: Recent Labs  Lab 04/30/17 1729  TROPONINI <0.03   BNP (last 3 results) No results for input(s): PROBNP in the last 8760 hours. HbA1C: Recent Labs    05/01/17 0016  HGBA1C 5.1   CBG: Recent Labs  Lab 04/30/17 1716  GLUCAP 73   Lipid Profile: Recent Labs    05/01/17 0016  CHOL 172  HDL 42  LDLCALC 108*  TRIG 109  CHOLHDL 4.1   Thyroid Function Tests: No results for input(s): TSH, T4TOTAL, FREET4, T3FREE, THYROIDAB in the last 72 hours. Anemia Panel: No results for input(s): VITAMINB12, FOLATE, FERRITIN, TIBC, IRON, RETICCTPCT in the last 72 hours. Urine analysis: No results found for: COLORURINE, APPEARANCEUR, LABSPEC, [place], GLUCOSEU, HGBUR, BILIRUBINUR, KETONESUR, PROTEINUR, UROBILINOGEN, NITRITE, LEUKOCYTESUR   )No results found for this or any previous visit (from the past 240 hour(s)).    Anti-infectives (From admission, onward)   None       Radiology Studies: Ct Angio Head W Or Wo Contrast  Result Date: 05/01/2017 CLINICAL DATA:  Acute presentation with speech disturbance yesterday. No acute MR finding. EXAM: CT ANGIOGRAPHY HEAD AND NECK TECHNIQUE: Multidetector CT imaging of the head and neck was performed using the standard protocol during bolus administration of intravenous contrast. Multiplanar CT image reconstructions and MIPs were obtained to evaluate the vascular anatomy. Carotid stenosis measurements (when applicable) are obtained utilizing NASCET criteria, using the distal internal carotid diameter as the denominator. CONTRAST:  34mL ISOVUE-370 IOPAMIDOL (ISOVUE-370) INJECTION 76% COMPARISON:  MR same day.  CT yesterday. FINDINGS: CTA NECK FINDINGS Aortic arch: Aortic atherosclerosis. No  aneurysm or dissection. 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 No origin stenoses. Right carotid system: Common carotid artery widely patent to the bifurcation region. Atherosclerotic plaque at the carotid bifurcation and ICA bulb. Minimal diameter of the ICA bulb is 4 mm. Compared to a more distal cervical ICA diameter of 5 mm, this indicates a 20% stenosis. Cervical ICA is widely patent beyond that. Left carotid system: Common carotid artery widely patent to the bifurcation region. Carotid bifurcation shows soft and calcified atherosclerotic plaque. Minimal diameter in the ICA bulb is 1 mm. Compared to a more distal cervical ICA diameter of 5 mm, this indicates an 80% stenosis. Cervical ICA widely patent distal to the bulb. Vertebral arteries: Left vertebral artery is dominant, arising directly from the arch. This vessel is widely patent through the cervical region. Right vertebral artery is a small vessel. There is mild  stenosis at the vessel origin from the subclavian, but the right vertebral artery is otherwise widely patent through the cervical region to the foramen magnum. Skeleton: Ordinary cervical spondylosis. Other neck: No mass or lymphadenopathy. Upper chest: Scattered areas of pulmonary scarring and emphysema. No active process. Review of the MIP images confirms the above findings CTA HEAD FINDINGS Anterior circulation: Both internal carotid arteries are patent through the skull base and siphon regions. There is atherosclerotic calcification in both carotid siphon regions without stenosis greater than 30%. Anterior and middle cerebral vessels are patent. Diminutive A1 segment on the left with both anterior cerebral arteries receiving most of there supply from the right carotid circulation. There is mild atherosclerotic narrowing but no flow limiting stenosis.  Posterior circulation: Both vertebral arteries are patent through the foramen magnum. Right vertebral artery terminates in PICA. Left vertebral artery supplies the basilar. 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 The most severe stenosis is greater than 90%, possibly even representing occlusion in the midportion. Distal basilar artery does show opacification as do the superior cerebellar and posterior cerebral arteries, with the supply largely due to a patent posterior communicating artery on the left. However, there is severe stenosis of the P1 segment on the left seen in addition. Venous sinuses: Patent and normal. Anatomic variants: Diminutive left A1 segment as noted above. Delayed phase: No abnormal enhancement. 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 Left vertebral artery originates directly from the arch as the third branch vessel. Atherosclerotic disease at both carotid bifurcations. 20% stenosis of the proximal ICA on the right. 80% stenosis of the proximal ICA on the left. Right vertebral artery terminates in PICA. Left vertebral artery supplies the basilar. 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 In the mid basilar artery, the vessel is very severely stenotic. Patent left posterior communicating artery. Flow present in the small distal vertebral artery and supplying both superior cerebellar and posterior cerebral arteries, suspected to derive from flow through the severely stenotic basilar artery and through the left posterior communicating artery, with the caveat that there is a severe stenosis the left P1 segment which would hinder the contribution from that route. Electronically Signed   By: Nelson Chimes M.D.   On: 05/01/2017 10:01   Ct Angio Neck W Or Wo Contrast  Result Date: 05/01/2017 CLINICAL DATA:  Acute presentation with speech  disturbance yesterday. No acute MR finding. EXAM: CT ANGIOGRAPHY HEAD AND NECK TECHNIQUE: Multidetector CT imaging of the head and neck was performed using the standard protocol during bolus administration of intravenous contrast. Multiplanar CT image reconstructions and MIPs were obtained to evaluate the vascular anatomy. Carotid stenosis measurements (when applicable) are obtained utilizing NASCET criteria, using the distal internal carotid diameter as the denominator. CONTRAST:  32mL ISOVUE-370 IOPAMIDOL (ISOVUE-370) INJECTION 76% COMPARISON:  MR same day.  CT yesterday. FINDINGS: CTA NECK FINDINGS Aortic arch: Aortic atherosclerosis. No aneurysm or dissection. 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 No origin stenoses. Right carotid system: Common carotid artery widely patent to the bifurcation region. Atherosclerotic plaque at the carotid bifurcation and ICA bulb. Minimal diameter of the ICA bulb is 4 mm. Compared to a more distal cervical ICA diameter of 5 mm, this indicates a 20% stenosis. Cervical ICA is widely patent beyond that.  Left carotid system: Common carotid artery widely patent to the bifurcation region. Carotid bifurcation shows soft and calcified atherosclerotic plaque. Minimal diameter in the ICA bulb is 1 mm. Compared to a more distal cervical ICA diameter of 5 mm, this indicates an 80% stenosis. Cervical ICA widely patent distal to the bulb. Vertebral arteries: Left vertebral artery is dominant, arising directly from the arch. This vessel is widely patent through the cervical region. Right vertebral artery is a small vessel. There is mild stenosis at the vessel origin from the subclavian, but the right vertebral artery is otherwise widely patent through the cervical region to the foramen magnum. Skeleton: Ordinary cervical spondylosis. Other neck: No mass or  lymphadenopathy. Upper chest: Scattered areas of pulmonary scarring and emphysema. No active process. Review of the MIP images confirms the above findings CTA HEAD FINDINGS Anterior circulation: Both internal carotid arteries are patent through the skull base and siphon regions. There is atherosclerotic calcification in both carotid siphon regions without stenosis greater than 30%. Anterior and middle cerebral vessels are patent. Diminutive A1 segment on the left with both anterior cerebral arteries receiving most of there supply from the right carotid circulation. There is mild atherosclerotic narrowing but no flow limiting stenosis. Posterior circulation: Both vertebral arteries are patent through the foramen magnum. Right vertebral artery terminates in PICA. Left vertebral artery supplies the basilar. 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 The most severe stenosis is greater than 90%, possibly even representing occlusion in the midportion. Distal basilar artery does show opacification as do the superior cerebellar and posterior cerebral arteries, with the supply largely due to a patent posterior communicating artery on the left. However, there is severe stenosis of the P1 segment on the left seen in addition. Venous sinuses: Patent and normal. Anatomic variants: Diminutive left A1 segment as noted above. Delayed phase: No abnormal enhancement. 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 Left vertebral artery originates directly from the arch as the third branch vessel. Atherosclerotic disease at both carotid bifurcations. 20% stenosis of the proximal ICA on the right. 80% stenosis of the proximal ICA on the left. Right vertebral artery terminates in PICA. Left vertebral artery supplies the basilar. 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 In the mid basilar  artery, the vessel is very severely stenotic. Patent left posterior communicating artery. Flow present in the small distal vertebral artery and supplying both superior cerebellar and posterior cerebral arteries, suspected to derive from flow through the severely stenotic basilar artery and through the left posterior communicating artery, with the caveat that there is a severe stenosis the left P1 segment which would hinder the contribution from that route. Electronically Signed   By: Nelson Chimes M.D.   On: 05/01/2017 10:01   Mr Brain Wo Contrast  Result Date: 05/01/2017 CLINICAL DATA:  Acute presentation with speech disturbance yesterday. EXAM: MRI HEAD WITHOUT CONTRAST TECHNIQUE: Multiplanar, multiecho pulse sequences of the brain and surrounding structures were obtained without intravenous contrast. COMPARISON:  CT 04/30/2017 FINDINGS: Brain: Diffusion imaging does not show any acute or subacute infarction. There chronic small-vessel ischemic changes of the pons. No cerebellar abnormality. Cerebral hemispheres show advanced chronic small-vessel ischemic changes throughout the deep and subcortical white matter. Small vessel changes of the basal ganglia and thalami also present. 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 No large vessel territory infarction. No mass lesion, hemorrhage, hydrocephalus or extra-axial  collection. Vascular: Major vessels at the base of the brain show flow. Skull and upper cervical spine: Negative Sinuses/Orbits: Clear/normal Other: None IMPRESSION: No acute finding by MR. Advanced chronic small-vessel ischemic changes throughout the brain. 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 Electronically Signed   By: Nelson Chimes M.D.   On: 05/01/2017 09:01   Dg Chest Portable 1 View  Result Date: 04/30/2017 CLINICAL DATA:  72 y/o M; slurred speech and altered mental status. Headache. History of TIA. EXAM: PORTABLE CHEST 1 VIEW COMPARISON:  09/29/2013 chest  radiograph FINDINGS: Stable heart size and mediastinal contours are within normal limits. Both lungs are clear. The visualized skeletal structures are unremarkable. IMPRESSION: No active disease. Electronically Signed   By: Kristine Garbe M.D.   On: 04/30/2017 18:07   Ct Head Code Stroke Wo Contrast`  Result Date: 04/30/2017 CLINICAL DATA:  Code stroke.  Slurred speech EXAM: CT HEAD WITHOUT CONTRAST TECHNIQUE: Contiguous axial images were obtained from the base of the skull through the vertex without intravenous contrast. COMPARISON:  None. FINDINGS: Brain: No mass lesion or acute hemorrhage. No focal hypoattenuation of the basal ganglia or cortex to indicate infarcted tissue. No hydrocephalus or age advanced atrophy. There is periventricular hypoattenuation compatible with chronic microvascular disease. Vascular: No hyperdense vessel. No advanced atherosclerotic calcification of the arteries at the skull base. Skull: Normal visualized skull base, calvarium and extracranial soft tissues. Sinuses/Orbits: No sinus fluid levels or advanced mucosal thickening. No mastoid effusion. Normal orbits. ASPECTS [facility] Stroke Program Early CT Score) - Ganglionic level infarction (caudate, lentiform nuclei, internal capsule, insula, M1-M3 cortex): 7 - Supraganglionic infarction (M4-M6 cortex): 3 Total score (0-10 with 10 being normal): 10 IMPRESSION: 1. No intracranial hemorrhage. 2. ASPECTS is 10. 3. Findings of chronic microvascular ischemia without acute abnormality. These results were called by telephone at the time of interpretation on 04/30/2017 at 5:39 pm to Dr. Gareth Morgan , who verbally acknowledged these results. Electronically Signed   By: Ulyses Jarred M.D.   On: 04/30/2017 17:41        Scheduled Meds: . atorvastatin  40 mg Oral q1800  . clopidogrel  75 mg Oral QHS  . enoxaparin (LOVENOX) injection  40 mg Subcutaneous Q24H  . gemfibrozil  600 mg Oral BID AC  . pantoprazole  40 mg Oral  QHS  . thiothixene  5 mg Oral TID   Continuous Infusions:   LOS: 0 days    Time spent: 15 min    Geradine Girt, DO [facility] Pager [phone]  If 7PM-7AM, please contact night-coverage [email or web address] Password [email or web address] 05/01/2017, 3:32 PM

## 2017-05-01 NOTE — H&P (View-Only) (Signed)
Hospital Consult    Reason for Consult:  TIA with L ICA stenosis Requesting Physician:  Neurology MRN #:  [id number]  History of Present Illness: This is a 72 y.o. male with past medical history significant for hyperlipidemia, sleep apnea, and history of TIA seen in consultation for newly diagnosed TIA.  He presented to the emergency department after having difficulty speaking.  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  CTA head and neck also performed which demonstrated 20% stenosis of right ICA, 80% stenosis of proximal left ICA as well as severely stenotic posterior circulation involving basilar system.  On exam patient is neurologically at baseline and denies any further speech changes, vision changes, or one-sided weakness.  He had been taking aspirin and statin at home.  Neurology has added Plavix.  He is a former smoker who quit about 15 years ago after having smoked for about 50 years per patient.    Past Medical History:  Diagnosis Date  . Anxiety    takes medication  . Bipolar 1 disorder ([place])   . Bronchitis   . GERD (gastroesophageal reflux disease)   . H/O hiatal hernia   . Shortness of breath   . Sleep apnea    2 YEARS  [place]   . Stroke [facility])     Past Surgical History:  Procedure Laterality Date  . CHOLECYSTECTOMY    . FRACTURE SURGERY     right ankle  . head injury    . KNEE SURGERY     x2  . ORIF SHOULDER FRACTURE  03/03/2011   Procedure: OPEN REDUCTION INTERNAL FIXATION (ORIF) SHOULDER FRACTURE;  Surgeon: Augustin Schooling;  Location: [place];  Service: Orthopedics;  Laterality: Left;  LEFT PROXIMAL HUMERUS Open Reduction internal fixation  . SHOULDER ARTHROSCOPY WITH SUBACROMIAL DECOMPRESSION AND BICEP TENDON REPAIR  02/24/2012   Procedure: SHOULDER ARTHROSCOPY WITH SUBACROMIAL DECOMPRESSION AND BICEP TENDON REPAIR;  Surgeon: Augustin Schooling, MD;  Location: [place];  Service: Orthopedics;   Laterality: Left;  with capsular release and lysis of adhesions  . TONSILLECTOMY      Allergies  Allergen Reactions  . Lipitor [Atorvastatin] Other (See Comments)    Myalgias    Prior to Admission medications   Medication Sig Start Date End Date Taking? Authorizing Provider  aspirin 81 MG tablet Take 81 mg by mouth daily.    Yes [provider]  gemfibrozil (LOPID) 600 MG tablet Take 600 mg by mouth 2 (two) times daily before a meal.   Yes [provider]  omeprazole (PRILOSEC) 20 MG capsule Take 20 mg by mouth daily.   Yes [provider]  pravastatin (PRAVACHOL) 20 MG tablet Take 20 mg by mouth daily.   Yes [provider]  thiothixene (NAVANE) 5 MG capsule Take 5 mg by mouth 3 (three) times daily.     Yes [provider]    Social History   Socioeconomic History  . Marital status: Married    Spouse name: Not on file  . Number of children: Not on file  . Years of education: Not on file  . Highest education level: Not on file  Social Needs  . Financial resource strain: Not on file  . Food insecurity - worry: Not on file  . Food insecurity - inability: Not on file  . Transportation needs - medical: Not on file  . Transportation needs - non-medical: Not on file  Occupational History  . Not on file  Tobacco Use  . Smoking status: Former Smoker    Packs/day: 1.50  . Smokeless tobacco: Never Used  . Tobacco comment: stopped smoking 15 years ago  Substance and Sexual Activity  . Alcohol use: No    Comment: past 20 years ago  . Drug use: No  . Sexual activity: Not on file  Other Topics Concern  . Not on file  Social History Narrative  . Not on file     Family History  Problem Relation Age of Onset  . Congestive Heart Failure Mother   . Stroke Mother   . Congestive Heart Failure Father     ROS: Otherwise negative unless mentioned in HPI  Physical Examination  Vitals:   04/30/17 2300 05/01/17 0107  BP:  125/65    Pulse: (!) 59 (!) 55  Resp: 18 18  Temp:  98.2 F (36.8 C)  SpO2: 98% 99%   Body mass index is 27.52 kg/m.  General:  WDWN in NAD Gait: Not observed HENT: WNL, normocephalic Pulmonary: normal non-labored breathing, without Rales, rhonchi,  wheezing Cardiac: regular, without  Murmurs, rubs or gallops; without carotid bruits Abdomen: soft, NT/ND, no masses Skin: without rashes Vascular Exam/Pulses: Symmetric PT pulses; symmetric radial pulses Extremities: without ischemic changes, without Gangrene , without cellulitis; without open wounds;  Musculoskeletal: no muscle wasting or atrophy  Neurologic: A&O X 3;  No focal weakness or paresthesias are detected; speech is fluent/normal; cranial nerves III through XII are grossly intact Psychiatric:  The pt has Normal affect. Lymph:  Unremarkable  CBC    Component Value Date/Time   WBC 8.7 05/01/2017 0016   RBC 4.66 05/01/2017 0016   HGB 14.0 05/01/2017 0016   HCT 41.0 05/01/2017 0016   PLT 266 05/01/2017 0016   MCV 88.0 05/01/2017 0016   MCH 30.0 05/01/2017 0016   MCHC 34.1 05/01/2017 0016   RDW 12.2 05/01/2017 0016   LYMPHSABS 2.7 04/30/2017 1729   MONOABS 0.9 04/30/2017 1729   EOSABS 0.5 04/30/2017 1729   BASOSABS 0.1 04/30/2017 1729    BMET    Component Value Date/Time   NA 136 05/01/2017 0016   K 3.4 (L) 05/01/2017 0016   CL 103 05/01/2017 0016   CO2 23 05/01/2017 0016   GLUCOSE 89 05/01/2017 0016   BUN 10 05/01/2017 0016   CREATININE 0.98 05/01/2017 0016   CREATININE 1.02 05/01/2017 0016   CALCIUM 9.0 05/01/2017 0016   GFRNONAA >60 05/01/2017 0016   GFRNONAA >60 05/01/2017 0016   GFRAA >60 05/01/2017 0016   GFRAA >60 05/01/2017 0016    COAGS: Lab Results  Component Value Date   INR 1.01 04/30/2017   INR 1.05 03/02/2011     Non-Invasive Vascular Imaging:   MR BRAIN WO CONTRAST (Accession [id number]) (Order [id number])  Imaging  Date: 04/30/2017 Department: [facility] Released  By/Authorizing: Rise Patience, MD (auto-released)  Exam Information   Status Exam Begun  Exam Ended   Final [99] 05/01/2017 7:30 AM 05/01/2017 8:10 AM  PACS Images   Show images for MR BRAIN WO CONTRAST  Study Result   CLINICAL DATA:  Acute presentation with speech disturbance yesterday.  EXAM: MRI HEAD WITHOUT CONTRAST  TECHNIQUE: Multiplanar, multiecho pulse sequences of the brain and surrounding structures were obtained without intravenous contrast.  COMPARISON:  CT 04/30/2017  FINDINGS: Brain: Diffusion imaging does not show any acute or subacute infarction. There chronic small-vessel ischemic changes of the pons. No  cerebellar abnormality. Cerebral hemispheres show advanced chronic small-vessel ischemic changes throughout the deep and subcortical white matter. Small vessel changes of the basal ganglia and thalami also present. 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 No large vessel territory infarction. No mass lesion, hemorrhage, hydrocephalus or extra-axial collection.  Vascular: Major vessels at the base of the brain show flow.  Skull and upper cervical spine: Negative  Sinuses/Orbits: Clear/normal  Other: None  IMPRESSION: No acute finding by MR. Advanced chronic small-vessel ischemic changes throughout the brain. 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   Electronically Signed   By: Nelson Chimes M.D.   On: 05/01/2017 09:01   CT ANGIO NECK W OR WO CONTRAST (Accession [id number]) (Order [id number])  Imaging  Date: 04/30/2017 Department: [facility] Released By/Authorizing: Rise Patience, MD (auto-released)  Exam Information   Status Exam Begun  Exam Ended   Final [99] 05/01/2017 9:04 AM 05/01/2017 9:44 AM  PACS Images   Show images for CT ANGIO NECK W OR WO CONTRAST  Study Result   CLINICAL DATA:  Acute presentation with speech disturbance yesterday. No acute MR  finding.  EXAM: CT ANGIOGRAPHY HEAD AND NECK  TECHNIQUE: Multidetector CT imaging of the head and neck was performed using the standard protocol during bolus administration of intravenous contrast. Multiplanar CT image reconstructions and MIPs were obtained to evaluate the vascular anatomy. Carotid stenosis measurements (when applicable) are obtained utilizing NASCET criteria, using the distal internal carotid diameter as the denominator.  CONTRAST:  76mL ISOVUE-370 IOPAMIDOL (ISOVUE-370) INJECTION 76%  COMPARISON:  MR same day.  CT yesterday.  FINDINGS: CTA NECK FINDINGS  Aortic arch: Aortic atherosclerosis. No aneurysm or dissection. 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 No origin stenoses.  Right carotid system: Common carotid artery widely patent to the bifurcation region. Atherosclerotic plaque at the carotid bifurcation and ICA bulb. Minimal diameter of the ICA bulb is 4 mm. Compared to a more distal cervical ICA diameter of 5 mm, this indicates a 20% stenosis. Cervical ICA is widely patent beyond that.  Left carotid system: Common carotid artery widely patent to the bifurcation region. Carotid bifurcation shows soft and calcified atherosclerotic plaque. Minimal diameter in the ICA bulb is 1 mm. Compared to a more distal cervical ICA diameter of 5 mm, this indicates an 80% stenosis. Cervical ICA widely patent distal to the bulb.  Vertebral arteries: Left vertebral artery is dominant, arising directly from the arch. This vessel is widely patent through the cervical region. Right vertebral artery is a small vessel. There is mild stenosis at the vessel origin from the subclavian, but the right vertebral artery is otherwise widely patent through the cervical region to the foramen magnum.  Skeleton: Ordinary cervical  spondylosis.  Other neck: No mass or lymphadenopathy.  Upper chest: Scattered areas of pulmonary scarring and emphysema. No active process.  Review of the MIP images confirms the above findings  CTA HEAD FINDINGS  Anterior circulation: Both internal carotid arteries are patent through the skull base and siphon regions. There is atherosclerotic calcification in both carotid siphon regions without stenosis greater than 30%. Anterior and middle cerebral vessels are patent. Diminutive A1 segment on the left with both anterior cerebral arteries receiving most of there supply from the right carotid circulation. There is mild atherosclerotic narrowing but no flow limiting stenosis.  Posterior circulation: Both vertebral arteries  are patent through the foramen magnum. Right vertebral artery terminates in PICA. Left vertebral artery supplies the basilar. 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 The most severe stenosis is greater than 90%, possibly even representing occlusion in the midportion. Distal basilar artery does show opacification as do the superior cerebellar and posterior cerebral arteries, with the supply largely due to a patent posterior communicating artery on the left. However, there is severe stenosis of the P1 segment on the left seen in addition.  Venous sinuses: Patent and normal.  Anatomic variants: Diminutive left A1 segment as noted above.  Delayed phase: No abnormal enhancement.  Review of the MIP images confirms the above findings  IMPRESSION: Atherosclerotic disease of the aortic arch but without aneurysm or dissection. Left vertebral artery originates directly from the arch as the third branch vessel.  Atherosclerotic disease at both carotid bifurcations. 20% stenosis of the proximal ICA on the right. 80% stenosis of the proximal ICA on the left.  Right vertebral artery terminates in PICA. Left  vertebral artery supplies the basilar. 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 In the mid basilar artery, the vessel is very severely stenotic. Patent left posterior communicating artery. Flow present in the small distal vertebral artery and supplying both superior cerebellar and posterior cerebral arteries, suspected to derive from flow through the severely stenotic basilar artery and through the left posterior communicating artery, with the caveat that there is a severe stenosis the left P1 segment which would hinder the contribution from that route.   Electronically Signed   By: Nelson Chimes M.D.   On: 05/01/2017 10:01     Statin:  Yes.   Beta Blocker:  No. Aspirin:  Yes.   ACEI:  No. ARB:  No. CCB use:  No Other antiplatelets/anticoagulants:  Yes.   plavix   ASSESSMENT/PLAN: This is a 72 y.o. male with TIA secondary to symptomatic left ICA stenosis  MRI negative for acute CVA CTA demonstrates 80% stenosis of proximal left ICA Patient was made aware this is considered a symptomatic lesion and he is at risk for CVA if left untreated Carotid endarterectomy was discussed with patient and his wife including risks and benefits Timing of revascularization will be discussed with on call surgeon   Dagoberto Ligas PA-C [facility] [phone]  I have examined the patient, reviewed and agree with above.  Discussed his critical left carotid stenosis and left brain TIA.  Fortunately no evidence of CVA on MRI.  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  Explained no specific treatment for this.  Have recommended left carotid endarterectomy for reduction of stroke risk.  Also explained the potential risk with surgery as specifically stroke or nerve injury.  Can proceed with surgery on 05/03/2017.  He is stable for discharge today from vascular standpoint.  Will be readmitted for admission day surgery.  He will obtain  clearance through the [place] administration tomorrow and have admission on Wednesday for surgery.  He understands that suspected overnight stay and discharged to home on Thursday  Curt Jews, MD 05/01/2017 6:04 PM

## 2017-05-01 NOTE — Evaluation (Signed)
Occupational Therapy Evaluation Patient Details Name: Melvin Foley MRN: [id number] DOB: 1946-02-28 Today's Date: 05/01/2017    History of Present Illness Melvin Foley a 72 y.o.malewithhistory of previous TIA 5 years ago, sleep apnea, hyperlipidemia was brought to the ER after patient was found to have difficulty speaking. PMH: bipolar d/o and anxiety. PSH: multiple shld surgeries.   Clinical Impression   PTA, pt was living with his wife and was independent. Pt currently performing ADLs and functional mobility at supervision-Mod I level; performing near baseline function. Pt reports he feels back to baseline. Provided education on [facility] for stroke signs and symptoms. Recommend dc home once medically stable per phsyican. All acute OT needs met and will sign off.     Follow Up Recommendations  No OT follow up;Supervision - Intermittent    Equipment Recommendations  None recommended by OT    Recommendations for Other Services       Precautions / Restrictions Precautions Precautions: None Restrictions Weight Bearing Restrictions: No      Mobility Bed Mobility Overal bed mobility: Independent                Transfers Overall transfer level: Modified independent Equipment used: None             General transfer comment: no difficulties, mild increased time    Balance Overall balance assessment: Modified Independent                               Standardized Balance Assessment Standardized Balance Assessment : Dynamic Gait Index   Dynamic Gait Index Level Surface: Normal Change in Gait Speed: Normal Gait with Horizontal Head Turns: Normal Gait with Vertical Head Turns: Normal Gait and Pivot Turn: Normal Step Over Obstacle: Mild Impairment Step Around Obstacles: Normal Steps: Mild Impairment Total Score: 22     ADL either performed or assessed with clinical judgement   ADL Overall ADL's : Needs assistance/impaired                                       General ADL Comments: Pt performing ADLs at supervision level. Feel pt is near baseline level.      Vision Baseline Vision/History: Wears glasses Wears Glasses: At all times Patient Visual Report: No change from baseline       Perception     Praxis      Pertinent Vitals/Pain Pain Assessment: No/denies pain     Hand Dominance Right   Extremity/Trunk Assessment Upper Extremity Assessment Upper Extremity Assessment: Overall WFL for tasks assessed   Lower Extremity Assessment Lower Extremity Assessment: Overall WFL for tasks assessed   Cervical / Trunk Assessment Cervical / Trunk Assessment: Normal   Communication Communication Communication: No difficulties   Cognition Arousal/Alertness: Awake/alert Behavior During Therapy: WFL for tasks assessed/performed Overall Cognitive Status: Within Functional Limits for tasks assessed                                 General Comments: Pt WFL for attention, memory, and problem solving. Pt completing money management task. Recalled 2/3 ST memory words; able to recall with multiple choice. Feel this is close to baseline   General Comments  Wife present throughout session. Educated pt and wife on [employer]    Exercises  Shoulder Instructions      Home Living Family/patient expects to be discharged to:: Private residence Living Arrangements: Spouse/significant other Available Help at Discharge: Family;Available 24 hours/day Type of Home: House Home Access: Stairs to enter [employer] of Steps: 1 Entrance Stairs-Rails: None Home Layout: One level     Bathroom Shower/Tub: Occupational psychologist: Standard     Home Equipment: Shower seat - built in          Prior Functioning/Environment Level of Independence: Independent        Comments: retired and drives        OT Problem List: Decreased activity tolerance;Impaired balance  (sitting and/or standing)      OT Treatment/Interventions:      OT Goals(Current goals can be found in the care plan section) Acute Rehab OT Goals Patient Stated Goal: home asap OT Goal Formulation: All assessment and education complete, DC therapy  OT Frequency:     Barriers to D/C:            Co-evaluation              AM-PAC PT "6 Clicks" Daily Activity     Outcome Measure Help from another person eating meals?: None Help from another person taking care of personal grooming?: None Help from another person toileting, which includes using toliet, bedpan, or urinal?: None Help from another person bathing (including washing, rinsing, drying)?: None Help from another person to put on and taking off regular upper body clothing?: None Help from another person to put on and taking off regular lower body clothing?: None 6 Click Score: 24   End of Session Equipment Utilized During Treatment: Gait belt Nurse Communication: Mobility status  Activity Tolerance: Patient tolerated treatment well Patient left: in bed;with call bell/phone within reach;with family/visitor present  OT Visit Diagnosis: Other abnormalities of gait and mobility (R26.89);Unsteadiness on feet (R26.81);Muscle weakness (generalized) (M62.81);Other symptoms and signs involving cognitive function                Time: 2111-5520 OT Time Calculation (min): 19 min Charges:  OT General Charges $OT Visit: 1 Visit OT Evaluation $OT Eval Low Complexity: 1 Low G-Codes:     Cavan Bearden MSOT, OTR/L Acute Rehab Pager: [phone] Office: [place] 05/01/2017, 4:49 PM

## 2017-05-01 NOTE — Progress Notes (Signed)
EEG completed, results pending. 

## 2017-05-01 NOTE — Progress Notes (Signed)
OT Cancellation    05/01/17 0700  OT Visit Information  Last OT Received On 05/01/17  Reason Eval/Treat Not Completed Patient at procedure or test/ unavailable (MRI. Will return as schdedule allows. Thank you.)   [place], OTR/L Acute Rehab Pager: [phone] Office: [phone]

## 2017-05-01 NOTE — Discharge Summary (Signed)
Physician Discharge Summary  Melvin Foley QQP:[id number] DOB: [birth year] DOA: 04/30/2017  PCP: System, Provider Not In  Admit date: 04/30/2017 Discharge date: 05/01/2017   Recommendations for Outpatient Follow-Up:   For vascular surgery on 1/16 ASA/plavix x 3 months then plavix alone  Discharge Diagnosis:   Principal Problem:   TIA (transient ischemic attack) Active Problems:   Hyperlipidemia   Difficulty with speech   Discharge disposition:  Home.  Discharge Condition: Improved.  Diet recommendation: Low sodium, heart healthy  Wound care: None.   History of Present Illness:   Melvin Foley is a 72 y.o. male with history of previous TIA 5 years ago, sleep apnea, hyperlipidemia was brought to the ER after patient was found to have difficulty speaking.  As per the patient and patient's wife patient woke up around 4:20 PM today and was found to have difficulty bringing out words.  Did not have any weakness of the upper or lower extremity or any visual symptoms or difficulty swallowing.  Since symptoms persisted patient went to the ER and by about an hour patient symptoms are improving.  ED Course: In the ER patient was found to be nonfocal.  CT head was unremarkable.  On-call neurologist was notified and patient admitted for further management of possible TIA.  On my exam patient able to move all extremities.  Patient did state that he had a headache the whole day in the morning which improved.     Hospital Course by Problem:   TIA -MRI negative -critical left carotid stenosis and left brain TIA.  Fortunately no evidence of CVA on MRI.  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  Explained no specific treatment for this.  Have recommended left carotid endarterectomy for reduction of stroke risk.  -plan for vascular surgery on Wednesday -statin -ASA/plavix x 3 months then plavix alone -EEG negative -outpatient neurology follow  up  HTN -goal of 130-150   Medical Consultants:    Neuro  Vascular surgery   Discharge Exam:   Vitals:   05/01/17 0107 05/01/17 1833  BP: 125/65 (!) 147/84  Pulse: (!) 55 70  Resp: 18 17  Temp: 98.2 F (36.8 C) 98.4 F (36.9 C)  SpO2: 99% 97%   Vitals:   04/30/17 2123 04/30/17 2300 05/01/17 0107 05/01/17 1833  BP: 136/87  125/65 (!) 147/84  Pulse: (!) 56 (!) 59 (!) 55 70  Resp: 18 18 18 17   Temp:   98.2 F (36.8 C) 98.4 F (36.9 C)  TempSrc: Oral  Oral Oral  SpO2: 99% 98% 99% 97%  Weight: 79.7 kg (175 lb 11.2 oz)     Height: 5\' 7"  (1.702 m)       Gen:  NAD    The results of significant diagnostics from this hospitalization (including imaging, microbiology, ancillary and laboratory) are listed below for reference.     Procedures and Diagnostic Studies:   Ct Angio Head W Or Wo Contrast  Result Date: 05/01/2017 CLINICAL DATA:  Acute presentation with speech disturbance yesterday. No acute MR finding. EXAM: CT ANGIOGRAPHY HEAD AND NECK TECHNIQUE: Multidetector CT imaging of the head and neck was performed using the standard protocol during bolus administration of intravenous contrast. Multiplanar CT image reconstructions and MIPs were obtained to evaluate the vascular anatomy. Carotid stenosis measurements (when applicable) are obtained utilizing NASCET criteria, using the distal internal carotid diameter as the denominator. CONTRAST:  64mL ISOVUE-370 IOPAMIDOL (ISOVUE-370) INJECTION 76% COMPARISON:  MR same day.  CT yesterday. FINDINGS: CTA NECK FINDINGS Aortic arch: Aortic atherosclerosis. No aneurysm or dissection. 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 No origin stenoses. Right carotid system: Common carotid artery widely patent to the bifurcation region. Atherosclerotic plaque at the carotid bifurcation and ICA bulb. Minimal  diameter of the ICA bulb is 4 mm. Compared to a more distal cervical ICA diameter of 5 mm, this indicates a 20% stenosis. Cervical ICA is widely patent beyond that. Left carotid system: Common carotid artery widely patent to the bifurcation region. Carotid bifurcation shows soft and calcified atherosclerotic plaque. Minimal diameter in the ICA bulb is 1 mm. Compared to a more distal cervical ICA diameter of 5 mm, this indicates an 80% stenosis. Cervical ICA widely patent distal to the bulb. Vertebral arteries: Left vertebral artery is dominant, arising directly from the arch. This vessel is widely patent through the cervical region. Right vertebral artery is a small vessel. There is mild stenosis at the vessel origin from the subclavian, but the right vertebral artery is otherwise widely patent through the cervical region to the foramen magnum. Skeleton: Ordinary cervical spondylosis. Other neck: No mass or lymphadenopathy. Upper chest: Scattered areas of pulmonary scarring and emphysema. No active process. Review of the MIP images confirms the above findings CTA HEAD FINDINGS Anterior circulation: Both internal carotid arteries are patent through the skull base and siphon regions. There is atherosclerotic calcification in both carotid siphon regions without stenosis greater than 30%. Anterior and middle cerebral vessels are patent. Diminutive A1 segment on the left with both anterior cerebral arteries receiving most of there supply from the right carotid circulation. There is mild atherosclerotic narrowing but no flow limiting stenosis. Posterior circulation: Both vertebral arteries are patent through the foramen magnum. Right vertebral artery terminates in PICA. Left vertebral artery supplies the basilar. 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 The most severe stenosis is greater than 90%, possibly even representing occlusion in the midportion. Distal basilar artery does  show opacification as do the superior cerebellar and posterior cerebral arteries, with the supply largely due to a patent posterior communicating artery on the left. However, there is severe stenosis of the P1 segment on the left seen in addition. Venous sinuses: Patent and normal. Anatomic variants: Diminutive left A1 segment as noted above. Delayed phase: No abnormal enhancement. 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 Left vertebral artery originates directly from the arch as the third branch vessel. Atherosclerotic disease at both carotid bifurcations. 20% stenosis of the proximal ICA on the right. 80% stenosis of the proximal ICA on the left. Right vertebral artery terminates in PICA. Left vertebral artery supplies the basilar. 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 In the mid basilar artery, the vessel is very severely stenotic. Patent left posterior communicating artery. Flow present in the small distal vertebral artery and supplying both superior cerebellar and posterior cerebral arteries, suspected to derive from flow through the severely stenotic basilar artery and through the left posterior communicating artery, with the caveat that there is a severe stenosis the left P1 segment which would hinder the contribution from that route. Electronically Signed   By: Nelson Chimes M.D.   On: 05/01/2017 10:01   Ct Angio Neck W Or Wo Contrast  Result Date: 05/01/2017 CLINICAL DATA:  Acute presentation with speech disturbance yesterday. No acute MR finding. EXAM: CT ANGIOGRAPHY HEAD AND NECK TECHNIQUE: Multidetector CT imaging of the head and neck was performed using the standard protocol during bolus administration of intravenous contrast. Multiplanar CT image reconstructions and MIPs were obtained to evaluate the vascular anatomy. Carotid stenosis measurements (when applicable) are obtained  utilizing NASCET criteria, using the distal internal carotid diameter as the denominator. CONTRAST:  35mL ISOVUE-370 IOPAMIDOL (ISOVUE-370) INJECTION 76% COMPARISON:  MR same day.  CT yesterday. FINDINGS: CTA NECK FINDINGS Aortic arch: Aortic atherosclerosis. No aneurysm or dissection. 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 No origin stenoses. Right carotid system: Common carotid artery widely patent to the bifurcation region. Atherosclerotic plaque at the carotid bifurcation and ICA bulb. Minimal diameter of the ICA bulb is 4 mm. Compared to a more distal cervical ICA diameter of 5 mm, this indicates a 20% stenosis. Cervical ICA is widely patent beyond that. Left carotid system: Common carotid artery widely patent to the bifurcation region. Carotid bifurcation shows soft and calcified atherosclerotic plaque. Minimal diameter in the ICA bulb is 1 mm. Compared to a more distal cervical ICA diameter of 5 mm, this indicates an 80% stenosis. Cervical ICA widely patent distal to the bulb. Vertebral arteries: Left vertebral artery is dominant, arising directly from the arch. This vessel is widely patent through the cervical region. Right vertebral artery is a small vessel. There is mild stenosis at the vessel origin from the subclavian, but the right vertebral artery is otherwise widely patent through the cervical region to the foramen magnum. Skeleton: Ordinary cervical spondylosis. Other neck: No mass or lymphadenopathy. Upper chest: Scattered areas of pulmonary scarring and emphysema. No active process. Review of the MIP images confirms the above findings CTA HEAD FINDINGS Anterior circulation: Both internal carotid arteries are patent through the skull base and siphon regions. There is atherosclerotic calcification in both carotid siphon regions without stenosis greater than 30%.  Anterior and middle cerebral vessels are patent. Diminutive A1 segment on the left with both anterior cerebral arteries receiving most of there supply from the right carotid circulation. There is mild atherosclerotic narrowing but no flow limiting stenosis. Posterior circulation: Both vertebral arteries are patent through the foramen magnum. Right vertebral artery terminates in PICA. Left vertebral artery supplies the basilar. 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 The most severe stenosis is greater than 90%, possibly even representing occlusion in the midportion. Distal basilar artery does show opacification as do the superior cerebellar and posterior cerebral arteries, with the supply largely due to a patent posterior communicating artery on the left. However, there is severe stenosis of the P1 segment on the left seen in addition. Venous sinuses: Patent and normal. Anatomic variants: Diminutive left A1 segment as noted above. Delayed phase: No abnormal enhancement. 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 Left vertebral artery originates directly from the arch as the third branch vessel. Atherosclerotic disease at both carotid bifurcations. 20% stenosis of the proximal ICA on the right. 80% stenosis of the proximal ICA on the left. Right vertebral artery terminates in PICA. Left vertebral artery supplies the basilar. 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 In the mid basilar artery, the vessel is very severely stenotic. Patent left posterior communicating artery. Flow present  in the small distal vertebral artery and supplying both superior cerebellar and posterior cerebral arteries, suspected to derive from flow through the severely stenotic basilar artery and through the left posterior communicating artery, with the caveat that there is a severe  stenosis the left P1 segment which would hinder the contribution from that route. Electronically Signed   By: Nelson Chimes M.D.   On: 05/01/2017 10:01   Mr Brain Wo Contrast  Result Date: 05/01/2017 CLINICAL DATA:  Acute presentation with speech disturbance yesterday. EXAM: MRI HEAD WITHOUT CONTRAST TECHNIQUE: Multiplanar, multiecho pulse sequences of the brain and surrounding structures were obtained without intravenous contrast. COMPARISON:  CT 04/30/2017 FINDINGS: Brain: Diffusion imaging does not show any acute or subacute infarction. There chronic small-vessel ischemic changes of the pons. No cerebellar abnormality. Cerebral hemispheres show advanced chronic small-vessel ischemic changes throughout the deep and subcortical white matter. Small vessel changes of the basal ganglia and thalami also present. 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 No large vessel territory infarction. No mass lesion, hemorrhage, hydrocephalus or extra-axial collection. Vascular: Major vessels at the base of the brain show flow. Skull and upper cervical spine: Negative Sinuses/Orbits: Clear/normal Other: None IMPRESSION: No acute finding by MR. Advanced chronic small-vessel ischemic changes throughout the brain. 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 Electronically Signed   By: Nelson Chimes M.D.   On: 05/01/2017 09:01   Dg Chest Portable 1 View  Result Date: 04/30/2017 CLINICAL DATA:  71 y/o M; slurred speech and altered mental status. Headache. History of TIA. EXAM: PORTABLE CHEST 1 VIEW COMPARISON:  09/29/2013 chest radiograph FINDINGS: Stable heart size and mediastinal contours are within normal limits. Both lungs are clear. The visualized skeletal structures are unremarkable. IMPRESSION: No active disease. Electronically Signed   By: Kristine Garbe M.D.   On: 04/30/2017 18:07   Ct Head Code Stroke Wo Contrast`  Result Date: 04/30/2017 CLINICAL DATA:  Code stroke.  Slurred  speech EXAM: CT HEAD WITHOUT CONTRAST TECHNIQUE: Contiguous axial images were obtained from the base of the skull through the vertex without intravenous contrast. COMPARISON:  None. FINDINGS: Brain: No mass lesion or acute hemorrhage. No focal hypoattenuation of the basal ganglia or cortex to indicate infarcted tissue. No hydrocephalus or age advanced atrophy. There is periventricular hypoattenuation compatible with chronic microvascular disease. Vascular: No hyperdense vessel. No advanced atherosclerotic calcification of the arteries at the skull base. Skull: Normal visualized skull base, calvarium and extracranial soft tissues. Sinuses/Orbits: No sinus fluid levels or advanced mucosal thickening. No mastoid effusion. Normal orbits. ASPECTS [facility] Stroke Program Early CT Score) - Ganglionic level infarction (caudate, lentiform nuclei, internal capsule, insula, M1-M3 cortex): 7 - Supraganglionic infarction (M4-M6 cortex): 3 Total score (0-10 with 10 being normal): 10 IMPRESSION: 1. No intracranial hemorrhage. 2. ASPECTS is 10. 3. Findings of chronic microvascular ischemia without acute abnormality. These results were called by telephone at the time of interpretation on 04/30/2017 at 5:39 pm to Dr. Gareth Morgan , who verbally acknowledged these results. Electronically Signed   By: Ulyses Jarred M.D.   On: 04/30/2017 17:41     Labs:   Basic Metabolic Panel: Recent Labs  Lab 04/30/17 1729 05/01/17 0016  NA 139 136  K 3.6 3.4*  CL 105 103  CO2 26 23  GLUCOSE 71 89  BUN 14 10  CREATININE 1.13 1.02  0.98  CALCIUM 9.0 9.0   GFR Estimated Creatinine Clearance: 68.9 mL/min (by C-G formula based on SCr of 0.98 mg/dL). Liver Function  Tests: Recent Labs  Lab 04/30/17 1729 05/01/17 0016  AST 16 18  ALT 13* 15*  ALKPHOS 57 53  BILITOT 0.7 1.0  PROT 7.4 6.5  ALBUMIN 4.0 3.8   No results for input(s): LIPASE, AMYLASE in the last 168 hours. No results for input(s): AMMONIA in the last 168  hours. Coagulation profile Recent Labs  Lab 04/30/17 1729  INR 1.01    CBC: Recent Labs  Lab 04/30/17 1729 05/01/17 0016  WBC 8.8 8.7  NEUTROABS 4.6  --   HGB 14.6 14.0  HCT 42.0 41.0  MCV 88.8 88.0  PLT 286 266   Cardiac Enzymes: Recent Labs  Lab 04/30/17 1729  TROPONINI <0.03   BNP: Invalid input(s): POCBNP CBG: Recent Labs  Lab 04/30/17 1716  GLUCAP 73   D-Dimer No results for input(s): DDIMER in the last 72 hours. Hgb A1c Recent Labs    05/01/17 0016  HGBA1C 5.1   Lipid Profile Recent Labs    05/01/17 0016  CHOL 172  HDL 42  LDLCALC 108*  TRIG 109  CHOLHDL 4.1   Thyroid function studies No results for input(s): TSH, T4TOTAL, T3FREE, THYROIDAB in the last 72 hours.  Invalid input(s): FREET3 Anemia work up No results for input(s): VITAMINB12, FOLATE, FERRITIN, TIBC, IRON, RETICCTPCT in the last 72 hours. Microbiology No results found for this or any previous visit (from the past 240 hour(s)).   Discharge Instructions:   Discharge Instructions    Ambulatory referral to Neurology   Complete by:  As directed    An appointment is requested in approximately:6 weeks Follow up with stroke clinic Cecille Rubin preferred, if not available, then consider Caesar Chestnut, [facility] or Jaynee Eagles whoever is available) at [facility] in about 6-8 weeks. Thanks.   Diet - low sodium heart healthy   Complete by:  As directed    Discharge instructions   Complete by:  As directed    Plan for vascular surgery on Wednesday   Increase activity slowly   Complete by:  As directed      Allergies as of 05/01/2017      Reactions   Lipitor [atorvastatin] Other (See Comments)   Myalgias      Medication List    TAKE these medications   aspirin 81 MG tablet Take 81 mg by mouth daily.   clopidogrel 75 MG tablet Commonly known as:  PLAVIX Take 1 tablet (75 mg total) by mouth at bedtime.   gemfibrozil 600 MG tablet Commonly known as:  LOPID Take 600 mg by mouth 2 (two)  times daily before a meal.   omeprazole 20 MG capsule Commonly known as:  PRILOSEC Take 20 mg by mouth daily.   pravastatin 20 MG tablet Commonly known as:  PRAVACHOL Take 20 mg by mouth daily.   thiothixene 5 MG capsule Commonly known as:  NAVANE Take 5 mg by mouth 3 (three) times daily.      Follow-up Information    Dennie Bible, NP. Schedule an appointment as soon as possible for a visit in 6 week(s).   Specialty:  Family Medicine Contact information: [address] [place] [address] [phone]        PCP at [facility] Follow up.        Rosetta Posner, MD Follow up.   Specialties:  Vascular Surgery, Cardiology Contact information: [place]  [phone]            Time coordinating discharge: 35 min  Signed:  Geradine Girt   [facility] 05/01/2017, 6:37 PM

## 2017-05-01 NOTE — Procedures (Signed)
ELECTROENCEPHALOGRAM REPORT  Date of Study: 05/01/2017  Patient's Name: Melvin Foley MRN: [id number] Date of Birth: 01-11-1946  Referring Provider: Dr. Rosalin Hawking  Clinical History: This is a 72 year old man with a transient episode of aphasia.  Medications: Tylenol Lipitor Plavix Lopid Pantoprazole Navane  Technical Summary: A multichannel digital EEG recording measured by the international 10-20 system with electrodes applied with paste and impedances below 5000 ohms performed in our laboratory with EKG monitoring in an awake and asleep patient.  Hyperventilation and photic stimulation were not performed.  The digital EEG was referentially recorded, reformatted, and digitally filtered in a variety of bipolar and referential montages for optimal display.    Description: The patient is awake and asleep during the recording.  During maximal wakefulness, there is a symmetric, medium voltage 8 Hz posterior dominant rhythm that attenuates with eye opening.  The record is symmetric.  During drowsiness and stage I sleep, there is an increase in theta slowing of the background with occasional vertex waves seen.  Hyperventilation and photic stimulation were not performed.  There were no epileptiform discharges or electrographic seizures seen.    EKG lead was unremarkable.  Impression: This awake and asleep EEG is normal.    Clinical Correlation: A normal EEG does not exclude a clinical diagnosis of epilepsy.  Clinical correlation is advised.   Ellouise Newer, M.D.

## 2017-05-01 NOTE — Evaluation (Signed)
Physical Therapy Evaluation Patient Details Name: Melvin Foley MRN: [id number] DOB: [birth year] Today's Date: 05/01/2017   History of Present Illness  Melvin Foley a 72 y.o.malewithhistory of previous TIA 5 years ago, sleep apnea, hyperlipidemia was brought to the ER after patient was found to have difficulty speaking. PMH: bipolar d/o and anxiety. PSH: multiple shld surgeries.  Clinical Impression  Pt admitted with above. Pt functioning near baseline. Pt with mildly impaired balance and increased anxiety limiting ability to maintain attention to tasks at hand. Acute PT to con't to monitor.   Follow Up Recommendations No PT follow up    Equipment Recommendations  None recommended by PT    Recommendations for Other Services       Precautions / Restrictions Precautions Precautions: None Restrictions Weight Bearing Restrictions: No      Mobility  Bed Mobility Overal bed mobility: Independent                Transfers Overall transfer level: Modified independent Equipment used: None             General transfer comment: no difficulties, mild increased time  Ambulation/Gait Ambulation/Gait assistance: Modified independent (Device/Increase time) Ambulation Distance (Feet): 200 Feet Assistive device: None Gait Pattern/deviations: Staggering left;Staggering right Gait velocity: slow Gait velocity interpretation: Below normal speed for age/gender General Gait Details: pt unsteady but no overt episode of LOB, pt reports "i have bad knees, so I stumble alot"   Stairs Stairs: Yes Stairs assistance: Min guard Stair Management: One rail Right Number of Stairs: 10 General stair comments: pt goes up on balls of feet, pt states "i've done this for forever"  Wheelchair Mobility    Modified Rankin (Stroke Patients Only) Modified Rankin (Stroke Patients Only) Pre-Morbid Rankin Score: No symptoms Modified Rankin: No symptoms     Balance Overall  balance assessment: Modified Independent                               Standardized Balance Assessment Standardized Balance Assessment : Dynamic Gait Index   Dynamic Gait Index Level Surface: Normal Change in Gait Speed: Normal Gait with Horizontal Head Turns: Normal Gait with Vertical Head Turns: Normal Gait and Pivot Turn: Normal Step Over Obstacle: Mild Impairment Step Around Obstacles: Normal Steps: Mild Impairment Total Score: 22       Pertinent Vitals/Pain Pain Assessment: No/denies pain    Home Living Family/patient expects to be discharged to:: Private residence Living Arrangements: Spouse/significant other Available Help at Discharge: Family;Available 24 hours/day Type of Home: House Home Access: Stairs to enter Entrance Stairs-Rails: None Entrance Stairs-Number of Steps: 1 Home Layout: One level Home Equipment: Shower seat - built in      Prior Function Level of Independence: Independent         Comments: drives     Hand Dominance   Dominant Hand: Right    Extremity/Trunk Assessment   Upper Extremity Assessment Upper Extremity Assessment: Overall WFL for tasks assessed    Lower Extremity Assessment Lower Extremity Assessment: Overall WFL for tasks assessed    Cervical / Trunk Assessment Cervical / Trunk Assessment: Normal  Communication   Communication: No difficulties  Cognition Arousal/Alertness: Awake/alert Behavior During Therapy: Anxious(mildly about tests and medications) Overall Cognitive Status: Within Functional Limits for tasks assessed  General Comments      Exercises     Assessment/Plan    PT Assessment Patient needs continued PT services  PT Problem List Decreased balance;Decreased activity tolerance       PT Treatment Interventions Stair training;Gait training;Functional mobility training;Therapeutic activities;Therapeutic exercise;Balance training     PT Goals (Current goals can be found in the Care Plan section)  Acute Rehab PT Goals Patient Stated Goal: home asap PT Goal Formulation: With patient Time For Goal Achievement: 05/15/17 Potential to Achieve Goals: Good Additional Goals Additional Goal #1: Pt to score >41 on DGI to indicate independence and minimal falls risk.    Frequency Min 3X/week   Barriers to discharge        Co-evaluation               AM-PAC PT "6 Clicks" Daily Activity  Outcome Measure Difficulty turning over in bed (including adjusting bedclothes, sheets and blankets)?: None Difficulty moving from lying on back to sitting on the side of the bed? : None Difficulty sitting down on and standing up from a chair with arms (e.g., wheelchair, bedside commode, etc,.)?: None Help needed moving to and from a bed to chair (including a wheelchair)?: None Help needed walking in hospital room?: A Little Help needed climbing 3-5 steps with a railing? : A Little 6 Click Score: 22    End of Session   Activity Tolerance: Patient tolerated treatment well Patient left: in bed;with call bell/phone within reach;with nursing/sitter in room;with family/visitor present(sitting EOB) Nurse Communication: Mobility status PT Visit Diagnosis: Unsteadiness on feet (R26.81)    Time: 2751-7001 PT Time Calculation (min) (ACUTE ONLY): 15 min   Charges:   PT Evaluation $PT Eval Low Complexity: 1 Low     PT G CodesKittie Plater, PT, DPT Pager #: [phone] Office #: [phone]   [place] 05/01/2017, 1:19 PM

## 2017-05-01 NOTE — Progress Notes (Signed)
  Echocardiogram 2D Echocardiogram has been performed.  Jennette Dubin 05/01/2017, 11:07 AM

## 2017-05-01 NOTE — Consult Note (Signed)
Hospital Consult    Reason for Consult:  TIA with L ICA stenosis Requesting Physician:  Neurology MRN #:  [id number]  History of Present Illness: This is a 72 y.o. male with past medical history significant for hyperlipidemia, sleep apnea, and history of TIA seen in consultation for newly diagnosed TIA.  He presented to the emergency department after having difficulty speaking.  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  CTA head and neck also performed which demonstrated 20% stenosis of right ICA, 80% stenosis of proximal left ICA as well as severely stenotic posterior circulation involving basilar system.  On exam patient is neurologically at baseline and denies any further speech changes, vision changes, or one-sided weakness.  He had been taking aspirin and statin at home.  Neurology has added Plavix.  He is a former smoker who quit about 15 years ago after having smoked for about 50 years per patient.    Past Medical History:  Diagnosis Date  . Anxiety    takes medication  . Bipolar 1 disorder ([place])   . Bronchitis   . GERD (gastroesophageal reflux disease)   . H/O hiatal hernia   . Shortness of breath   . Sleep apnea    2 YEARS  [place]   . Stroke [facility])     Past Surgical History:  Procedure Laterality Date  . CHOLECYSTECTOMY    . FRACTURE SURGERY     right ankle  . head injury    . KNEE SURGERY     x2  . ORIF SHOULDER FRACTURE  03/03/2011   Procedure: OPEN REDUCTION INTERNAL FIXATION (ORIF) SHOULDER FRACTURE;  Surgeon: Augustin Schooling;  Location: [facility];  Service: Orthopedics;  Laterality: Left;  LEFT PROXIMAL HUMERUS Open Reduction internal fixation  . SHOULDER ARTHROSCOPY WITH SUBACROMIAL DECOMPRESSION AND BICEP TENDON REPAIR  02/24/2012   Procedure: SHOULDER ARTHROSCOPY WITH SUBACROMIAL DECOMPRESSION AND BICEP TENDON REPAIR;  Surgeon: Augustin Schooling, MD;  Location: [facility];  Service: Orthopedics;   Laterality: Left;  with capsular release and lysis of adhesions  . TONSILLECTOMY      Allergies  Allergen Reactions  . Lipitor [Atorvastatin] Other (See Comments)    Myalgias    Prior to Admission medications   Medication Sig Start Date End Date Taking? Authorizing Provider  aspirin 81 MG tablet Take 81 mg by mouth daily.    Yes [provider]  gemfibrozil (LOPID) 600 MG tablet Take 600 mg by mouth 2 (two) times daily before a meal.   Yes [provider]  omeprazole (PRILOSEC) 20 MG capsule Take 20 mg by mouth daily.   Yes [provider]  pravastatin (PRAVACHOL) 20 MG tablet Take 20 mg by mouth daily.   Yes [provider]  thiothixene (NAVANE) 5 MG capsule Take 5 mg by mouth 3 (three) times daily.     Yes [provider]    Social History   Socioeconomic History  . Marital status: Married    Spouse name: Not on file  . Number of children: Not on file  . Years of education: Not on file  . Highest education level: Not on file  Social Needs  . Financial resource strain: Not on file  . Food insecurity - worry: Not on file  . Food insecurity - inability: Not on file  . Transportation needs - medical: Not on file  . Transportation needs - non-medical: Not on file  Occupational History  . Not on file  Tobacco Use  . Smoking status: Former Smoker    Packs/day: 1.50  . Smokeless tobacco: Never Used  . Tobacco comment: stopped smoking 15 years ago  Substance and Sexual Activity  . Alcohol use: No    Comment: past 20 years ago  . Drug use: No  . Sexual activity: Not on file  Other Topics Concern  . Not on file  Social History Narrative  . Not on file     Family History  Problem Relation Age of Onset  . Congestive Heart Failure Mother   . Stroke Mother   . Congestive Heart Failure Father     ROS: Otherwise negative unless mentioned in HPI  Physical Examination  Vitals:   04/30/17 2300 05/01/17 0107  BP:  125/65    Pulse: (!) 59 (!) 55  Resp: 18 18  Temp:  98.2 F (36.8 C)  SpO2: 98% 99%   Body mass index is 27.52 kg/m.  General:  WDWN in NAD Gait: Not observed HENT: WNL, normocephalic Pulmonary: normal non-labored breathing, without Rales, rhonchi,  wheezing Cardiac: regular, without  Murmurs, rubs or gallops; without carotid bruits Abdomen: soft, NT/ND, no masses Skin: without rashes Vascular Exam/Pulses: Symmetric PT pulses; symmetric radial pulses Extremities: without ischemic changes, without Gangrene , without cellulitis; without open wounds;  Musculoskeletal: no muscle wasting or atrophy  Neurologic: A&O X 3;  No focal weakness or paresthesias are detected; speech is fluent/normal; cranial nerves III through XII are grossly intact Psychiatric:  The pt has Normal affect. Lymph:  Unremarkable  CBC    Component Value Date/Time   WBC 8.7 05/01/2017 0016   RBC 4.66 05/01/2017 0016   HGB 14.0 05/01/2017 0016   HCT 41.0 05/01/2017 0016   PLT 266 05/01/2017 0016   MCV 88.0 05/01/2017 0016   MCH 30.0 05/01/2017 0016   MCHC 34.1 05/01/2017 0016   RDW 12.2 05/01/2017 0016   LYMPHSABS 2.7 04/30/2017 1729   MONOABS 0.9 04/30/2017 1729   EOSABS 0.5 04/30/2017 1729   BASOSABS 0.1 04/30/2017 1729    BMET    Component Value Date/Time   NA 136 05/01/2017 0016   K 3.4 (L) 05/01/2017 0016   CL 103 05/01/2017 0016   CO2 23 05/01/2017 0016   GLUCOSE 89 05/01/2017 0016   BUN 10 05/01/2017 0016   CREATININE 0.98 05/01/2017 0016   CREATININE 1.02 05/01/2017 0016   CALCIUM 9.0 05/01/2017 0016   GFRNONAA >60 05/01/2017 0016   GFRNONAA >60 05/01/2017 0016   GFRAA >60 05/01/2017 0016   GFRAA >60 05/01/2017 0016    COAGS: Lab Results  Component Value Date   INR 1.01 04/30/2017   INR 1.05 03/02/2011     Non-Invasive Vascular Imaging:   MR BRAIN WO CONTRAST (Accession [id number]) (Order [id number])  Imaging  Date: 04/30/2017 Department: [facility] Released  By/Authorizing: Rise Patience, MD (auto-released)  Exam Information   Status Exam Begun  Exam Ended   Final [99] 05/01/2017 7:30 AM 05/01/2017 8:10 AM  PACS Images   Show images for MR BRAIN WO CONTRAST  Study Result   CLINICAL DATA:  Acute presentation with speech disturbance yesterday.  EXAM: MRI HEAD WITHOUT CONTRAST  TECHNIQUE: Multiplanar, multiecho pulse sequences of the brain and surrounding structures were obtained without intravenous contrast.  COMPARISON:  CT 04/30/2017  FINDINGS: Brain: Diffusion imaging does not show any acute or subacute infarction. There chronic small-vessel ischemic changes of the pons. No  cerebellar abnormality. Cerebral hemispheres show advanced chronic small-vessel ischemic changes throughout the deep and subcortical white matter. Small vessel changes of the basal ganglia and thalami also present. 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 No large vessel territory infarction. No mass lesion, hemorrhage, hydrocephalus or extra-axial collection.  Vascular: Major vessels at the base of the brain show flow.  Skull and upper cervical spine: Negative  Sinuses/Orbits: Clear/normal  Other: None  IMPRESSION: No acute finding by MR. Advanced chronic small-vessel ischemic changes throughout the brain. 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   Electronically Signed   By: Nelson Chimes M.D.   On: 05/01/2017 09:01   CT ANGIO NECK W OR WO CONTRAST (Accession [id number]) (Order [id number])  Imaging  Date: 04/30/2017 Department: [facility] Released By/Authorizing: Rise Patience, MD (auto-released)  Exam Information   Status Exam Begun  Exam Ended   Final [99] 05/01/2017 9:04 AM 05/01/2017 9:44 AM  PACS Images   Show images for CT ANGIO NECK W OR WO CONTRAST  Study Result   CLINICAL DATA:  Acute presentation with speech disturbance yesterday. No acute MR  finding.  EXAM: CT ANGIOGRAPHY HEAD AND NECK  TECHNIQUE: Multidetector CT imaging of the head and neck was performed using the standard protocol during bolus administration of intravenous contrast. Multiplanar CT image reconstructions and MIPs were obtained to evaluate the vascular anatomy. Carotid stenosis measurements (when applicable) are obtained utilizing NASCET criteria, using the distal internal carotid diameter as the denominator.  CONTRAST:  64mL ISOVUE-370 IOPAMIDOL (ISOVUE-370) INJECTION 76%  COMPARISON:  MR same day.  CT yesterday.  FINDINGS: CTA NECK FINDINGS  Aortic arch: Aortic atherosclerosis. No aneurysm or dissection. 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 No origin stenoses.  Right carotid system: Common carotid artery widely patent to the bifurcation region. Atherosclerotic plaque at the carotid bifurcation and ICA bulb. Minimal diameter of the ICA bulb is 4 mm. Compared to a more distal cervical ICA diameter of 5 mm, this indicates a 20% stenosis. Cervical ICA is widely patent beyond that.  Left carotid system: Common carotid artery widely patent to the bifurcation region. Carotid bifurcation shows soft and calcified atherosclerotic plaque. Minimal diameter in the ICA bulb is 1 mm. Compared to a more distal cervical ICA diameter of 5 mm, this indicates an 80% stenosis. Cervical ICA widely patent distal to the bulb.  Vertebral arteries: Left vertebral artery is dominant, arising directly from the arch. This vessel is widely patent through the cervical region. Right vertebral artery is a small vessel. There is mild stenosis at the vessel origin from the subclavian, but the right vertebral artery is otherwise widely patent through the cervical region to the foramen magnum.  Skeleton: Ordinary cervical  spondylosis.  Other neck: No mass or lymphadenopathy.  Upper chest: Scattered areas of pulmonary scarring and emphysema. No active process.  Review of the MIP images confirms the above findings  CTA HEAD FINDINGS  Anterior circulation: Both internal carotid arteries are patent through the skull base and siphon regions. There is atherosclerotic calcification in both carotid siphon regions without stenosis greater than 30%. Anterior and middle cerebral vessels are patent. Diminutive A1 segment on the left with both anterior cerebral arteries receiving most of there supply from the right carotid circulation. There is mild atherosclerotic narrowing but no flow limiting stenosis.  Posterior circulation: Both vertebral arteries  are patent through the foramen magnum. Right vertebral artery terminates in PICA. Left vertebral artery supplies the basilar. 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 The most severe stenosis is greater than 90%, possibly even representing occlusion in the midportion. Distal basilar artery does show opacification as do the superior cerebellar and posterior cerebral arteries, with the supply largely due to a patent posterior communicating artery on the left. However, there is severe stenosis of the P1 segment on the left seen in addition.  Venous sinuses: Patent and normal.  Anatomic variants: Diminutive left A1 segment as noted above.  Delayed phase: No abnormal enhancement.  Review of the MIP images confirms the above findings  IMPRESSION: Atherosclerotic disease of the aortic arch but without aneurysm or dissection. Left vertebral artery originates directly from the arch as the third branch vessel.  Atherosclerotic disease at both carotid bifurcations. 20% stenosis of the proximal ICA on the right. 80% stenosis of the proximal ICA on the left.  Right vertebral artery terminates in PICA. Left  vertebral artery supplies the basilar. 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 In the mid basilar artery, the vessel is very severely stenotic. Patent left posterior communicating artery. Flow present in the small distal vertebral artery and supplying both superior cerebellar and posterior cerebral arteries, suspected to derive from flow through the severely stenotic basilar artery and through the left posterior communicating artery, with the caveat that there is a severe stenosis the left P1 segment which would hinder the contribution from that route.   Electronically Signed   By: Nelson Chimes M.D.   On: 05/01/2017 10:01     Statin:  Yes.   Beta Blocker:  No. Aspirin:  Yes.   ACEI:  No. ARB:  No. CCB use:  No Other antiplatelets/anticoagulants:  Yes.   plavix   ASSESSMENT/PLAN: This is a 72 y.o. male with TIA secondary to symptomatic left ICA stenosis  MRI negative for acute CVA CTA demonstrates 80% stenosis of proximal left ICA Patient was made aware this is considered a symptomatic lesion and he is at risk for CVA if left untreated Carotid endarterectomy was discussed with patient and his wife including risks and benefits Timing of revascularization will be discussed with on call surgeon   Dagoberto Ligas PA-C [facility] [phone]  I have examined the patient, reviewed and agree with above.  Discussed his critical left carotid stenosis and left brain TIA.  Fortunately no evidence of CVA on MRI.  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  Explained no specific treatment for this.  Have recommended left carotid endarterectomy for reduction of stroke risk.  Also explained the potential risk with surgery as specifically stroke or nerve injury.  Can proceed with surgery on 05/03/2017.  He is stable for discharge today from vascular standpoint.  Will be readmitted for admission day surgery.  He will obtain  clearance through the [place] administration tomorrow and have admission on Wednesday for surgery.  He understands that suspected overnight stay and discharged to home on Thursday  Curt Jews, MD 05/01/2017 6:04 PM

## 2017-05-01 NOTE — Progress Notes (Signed)
Discharge information given to patient with prescription.  All questions answered and discharge information explained.  Patient has no further questions, wheeled down by NT and placed in vehicle with wife.

## 2017-05-01 NOTE — Progress Notes (Signed)
SLP Cancellation Note  Patient Details Name: Melvin Foley MRN: [id number] DOB: 08-Oct-1945   Cancelled treatment:       Reason Eval/Treat Not Completed: SLP screened, no needs identified, will sign off   Kilah Drahos, Katherene Ponto 05/01/2017, 1:09 PM

## 2017-05-02 ENCOUNTER — Telehealth: Payer: Self-pay | Admitting: *Deleted

## 2017-05-02 ENCOUNTER — Other Ambulatory Visit: Payer: Self-pay

## 2017-05-02 ENCOUNTER — Other Ambulatory Visit: Payer: Self-pay | Admitting: *Deleted

## 2017-05-02 ENCOUNTER — Encounter (HOSPITAL_COMMUNITY): Payer: Self-pay | Admitting: *Deleted

## 2017-05-02 NOTE — Telephone Encounter (Signed)
Spoke to patient and his wife. To be at [facility] admitting at  12:15 pm 05/03/17 for surgery. NPO for 6 hours prior to surgery, expect to stay overnight in hospital. Expect call from the [facility] and follow the detailed instructions about this surgery. Verbalized understanding. Call to PAD that this patient add on for 05/03/17 and all info give to F/U nurse  and pre-service center for pre-cert.

## 2017-05-02 NOTE — Progress Notes (Signed)
Pt denies SOB, chest pain, and being under the care of a cardiologist. Pt denies having a cardiac cath but stated that a stress test was performed > 10 years ago. Pt stated that he does not take any medications in the morning or afternoon, all medications are taken at night. Pt made aware to stop taking vitamins, fish oil and herbal medications. Do not take any NSAIDs ie: Ibuprofen, Advil, Naproxen (Aleve), Motrin, BC and Goody Powder. Pt verbalized understanding of all pre-op instructions. Anesthesia asked to review pt history.

## 2017-05-02 NOTE — Progress Notes (Signed)
Anesthesia Chart Review:  Pt is a same day work up  Pt is a 72 year old male scheduled for L CEA on 05/03/2017 with Curt Jews, MD  PMH includes:  Stroke, TIA (04/30/17), OSA, GERD. Former smoker. BMI 27.5. S/p L shoulder arthroscopy 02/24/12.  S/p ORIF L shoulder 03/03/11.   - Hospitalized 1/13-14/19 for TIA.   Medications include: ASA 81 mg, Plavix, gemfibrozil, Prilosec, pravastatin  Labs will be obtained day of surgery.  - CBC, CMET from 05/01/17 acceptable for surgery  1 view CXR 04/30/17: No active disease.  EKG 04/30/17: Sinus rhythm. Borderline LAD. Abnormal R-wave progression, early transition. Baseline wander in lead(s) I aVR aVL  Echo 05/01/17:  - Left ventricle: The cavity size was normal. Systolic function was normal. The estimated ejection fraction was in the range of 55% to 60%. Wall motion was normal; there were no regional wall motion abnormalities. Doppler parameters are consistent with abnormal left ventricular relaxation (grade 1 diastolic dysfunction). Doppler parameters are consistent with indeterminate mean left atrial filling pressure.  CT angio head, neck 05/01/17:  - Atherosclerotic disease of the aortic arch but without aneurysm or dissection. Left vertebral artery originates directly from the arch as the third branch vessel. - Atherosclerotic disease at both carotid bifurcations. 20% stenosis of the proximal ICA on the right. 80% stenosis of the proximal ICA on the left. - Right vertebral artery terminates in PICA. Left vertebral artery supplies the basilar. 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 In the mid basilar artery, the vessel is very severely stenotic. Patent left posterior communicating artery. Flow present in the small distal vertebral artery and supplying both superior cerebellar and posterior cerebral arteries, suspected to derive from flow through the severely stenotic basilar artery and through the left posterior communicating artery, with  the caveat that there is a severe stenosis the left P1 segment which would hinder the contribution from that route.  If labs acceptable day of surgery, I anticipate pt can proceed with surgery as scheduled.   Willeen Cass, FNP-BC [facility]/Anesthesiology Phone: [phone] 05/02/2017 4:48 PM

## 2017-05-02 NOTE — Progress Notes (Signed)
Left msg. For patient to call back re: details for surgery on 05/03/17.

## 2017-05-03 ENCOUNTER — Inpatient Hospital Stay (HOSPITAL_COMMUNITY): Payer: Medicare Other | Admitting: Emergency Medicine

## 2017-05-03 ENCOUNTER — Encounter (HOSPITAL_COMMUNITY)
Admission: RE | Disposition: A | Discharge status: Home / Self Care | Payer: Self-pay | Source: Home / Self Care | Attending: Vascular Surgery

## 2017-05-03 ENCOUNTER — Encounter (HOSPITAL_COMMUNITY): Payer: Self-pay | Admitting: *Deleted

## 2017-05-03 ENCOUNTER — Inpatient Hospital Stay (HOSPITAL_COMMUNITY)
Admission: RE | Admit: 2017-05-03 | Discharge: 2017-05-04 | DRG: 039 | Disposition: A | Discharge status: Home / Self Care | Payer: Medicare Other | Attending: Vascular Surgery | Admitting: Vascular Surgery

## 2017-05-03 ENCOUNTER — Other Ambulatory Visit: Payer: Self-pay

## 2017-05-03 DIAGNOSIS — I6523 Occlusion and stenosis of bilateral carotid arteries: Principal | ICD-10-CM | POA: Diagnosis present

## 2017-05-03 DIAGNOSIS — Z8249 Family history of ischemic heart disease and other diseases of the circulatory system: Secondary | ICD-10-CM | POA: Diagnosis not present

## 2017-05-03 DIAGNOSIS — G4730 Sleep apnea, unspecified: Secondary | ICD-10-CM | POA: Diagnosis present

## 2017-05-03 DIAGNOSIS — Z823 Family history of stroke: Secondary | ICD-10-CM

## 2017-05-03 DIAGNOSIS — F319 Bipolar disorder, unspecified: Secondary | ICD-10-CM | POA: Diagnosis present

## 2017-05-03 DIAGNOSIS — K219 Gastro-esophageal reflux disease without esophagitis: Secondary | ICD-10-CM | POA: Diagnosis present

## 2017-05-03 DIAGNOSIS — Z87891 Personal history of nicotine dependence: Secondary | ICD-10-CM

## 2017-05-03 DIAGNOSIS — E785 Hyperlipidemia, unspecified: Secondary | ICD-10-CM | POA: Diagnosis present

## 2017-05-03 DIAGNOSIS — I6529 Occlusion and stenosis of unspecified carotid artery: Secondary | ICD-10-CM | POA: Diagnosis present

## 2017-05-03 DIAGNOSIS — Z7982 Long term (current) use of aspirin: Secondary | ICD-10-CM

## 2017-05-03 DIAGNOSIS — I10 Essential (primary) hypertension: Secondary | ICD-10-CM | POA: Diagnosis present

## 2017-05-03 DIAGNOSIS — Z888 Allergy status to other drugs, medicaments and biological substances status: Secondary | ICD-10-CM | POA: Diagnosis not present

## 2017-05-03 DIAGNOSIS — Z8673 Personal history of transient ischemic attack (TIA), and cerebral infarction without residual deficits: Secondary | ICD-10-CM

## 2017-05-03 DIAGNOSIS — I6522 Occlusion and stenosis of left carotid artery: Secondary | ICD-10-CM

## 2017-05-03 DIAGNOSIS — Z79899 Other long term (current) drug therapy: Secondary | ICD-10-CM | POA: Diagnosis not present

## 2017-05-03 DIAGNOSIS — G459 Transient cerebral ischemic attack, unspecified: Secondary | ICD-10-CM | POA: Diagnosis present

## 2017-05-03 HISTORY — DX: Unspecified osteoarthritis, unspecified site: M19.90

## 2017-05-03 HISTORY — DX: Occlusion and stenosis of left carotid artery: I65.22

## 2017-05-03 HISTORY — PX: ENDARTERECTOMY: SHX5162

## 2017-05-03 HISTORY — DX: Essential (primary) hypertension: I10

## 2017-05-03 LAB — TYPE AND SCREEN
ABO/RH(D): O POS
ANTIBODY SCREEN: NEGATIVE

## 2017-05-03 LAB — SURGICAL PCR SCREEN
MRSA, PCR: NEGATIVE
Staphylococcus aureus: NEGATIVE

## 2017-05-03 LAB — ABO/RH: ABO/RH(D): O POS

## 2017-05-03 LAB — APTT: APTT: 28 s (ref 24–36)

## 2017-05-03 SURGERY — ENDARTERECTOMY, CAROTID
Anesthesia: General | Site: Neck | Laterality: Left

## 2017-05-03 MED ORDER — ONDANSETRON HCL 4 MG/2ML IJ SOLN: 4.0000 mg | Freq: Four times a day (QID) | INTRAMUSCULAR | Status: DC | PRN

## 2017-05-03 MED ORDER — THIOTHIXENE 5 MG PO CAPS
5.0000 mg | ORAL_CAPSULE | Freq: Three times a day (TID) | ORAL | Status: DC
  Administered 2017-05-03 – 2017-05-04 (×2): 5 mg via ORAL
  Filled 2017-05-03 (×3): qty 1

## 2017-05-03 MED ORDER — MAGNESIUM SULFATE 2 GM/50ML IV SOLN: 2.0000 g | Freq: Every day | INTRAVENOUS | Status: DC | PRN

## 2017-05-03 MED ORDER — SODIUM CHLORIDE 0.9 % IV SOLN: INTRAVENOUS | Status: DC

## 2017-05-03 MED ORDER — PROTAMINE SULFATE 10 MG/ML IV SOLN
INTRAVENOUS | Status: DC | PRN
Start: 2017-05-03 — End: 2017-05-03
  Administered 2017-05-03: 40 mg via INTRAVENOUS
  Administered 2017-05-03: 10 mg via INTRAVENOUS

## 2017-05-03 MED ORDER — FENTANYL CITRATE (PF) 250 MCG/5ML IJ SOLN
INTRAMUSCULAR | Status: AC
  Filled 2017-05-03: qty 5

## 2017-05-03 MED ORDER — SODIUM CHLORIDE 0.9 % IV SOLN: 500.0000 mL | Freq: Once | INTRAVENOUS | Status: DC | PRN

## 2017-05-03 MED ORDER — HEPARIN SODIUM (PORCINE) 1000 UNIT/ML IJ SOLN
INTRAMUSCULAR | Status: DC | PRN
  Administered 2017-05-03: 8000 [IU] via INTRAVENOUS

## 2017-05-03 MED ORDER — DEXAMETHASONE SODIUM PHOSPHATE 10 MG/ML IJ SOLN
INTRAMUSCULAR | Status: DC | PRN
  Administered 2017-05-03: 5 mg via INTRAVENOUS

## 2017-05-03 MED ORDER — SUGAMMADEX SODIUM 200 MG/2ML IV SOLN
INTRAVENOUS | Status: DC | PRN
  Administered 2017-05-03: 160 mg via INTRAVENOUS

## 2017-05-03 MED ORDER — HYDRALAZINE HCL 20 MG/ML IJ SOLN: 5.0000 mg | INTRAMUSCULAR | Status: DC | PRN

## 2017-05-03 MED ORDER — PANTOPRAZOLE SODIUM 40 MG PO TBEC
40.0000 mg | DELAYED_RELEASE_TABLET | Freq: Every day | ORAL | Status: DC
  Administered 2017-05-03 – 2017-05-04 (×2): 40 mg via ORAL
  Filled 2017-05-03 (×2): qty 1

## 2017-05-03 MED ORDER — ENOXAPARIN SODIUM 30 MG/0.3ML ~~LOC~~ SOLN: 30.0000 mg | SUBCUTANEOUS | Status: DC

## 2017-05-03 MED ORDER — EPHEDRINE SULFATE 50 MG/ML IJ SOLN
INTRAMUSCULAR | Status: DC | PRN
  Administered 2017-05-03: 5 mg via INTRAVENOUS
  Administered 2017-05-03 (×2): 10 mg via INTRAVENOUS

## 2017-05-03 MED ORDER — GLYCOPYRROLATE 0.2 MG/ML IJ SOLN
INTRAMUSCULAR | Status: DC | PRN
  Administered 2017-05-03: .2 mg via INTRAVENOUS

## 2017-05-03 MED ORDER — PHENYLEPHRINE HCL 10 MG/ML IJ SOLN
INTRAMUSCULAR | Status: DC | PRN
  Administered 2017-05-03: 40 ug via INTRAVENOUS
  Administered 2017-05-03: 80 ug via INTRAVENOUS

## 2017-05-03 MED ORDER — MUPIROCIN 2 % EX OINT
TOPICAL_OINTMENT | CUTANEOUS | Status: AC
  Administered 2017-05-03: 1 via TOPICAL
  Filled 2017-05-03: qty 22

## 2017-05-03 MED ORDER — MIDAZOLAM HCL 2 MG/2ML IJ SOLN
INTRAMUSCULAR | Status: AC
  Filled 2017-05-03: qty 2

## 2017-05-03 MED ORDER — POTASSIUM CHLORIDE CRYS ER 20 MEQ PO TBCR: 20.0000 meq | EXTENDED_RELEASE_TABLET | Freq: Every day | ORAL | Status: DC | PRN

## 2017-05-03 MED ORDER — FENTANYL CITRATE (PF) 100 MCG/2ML IJ SOLN
INTRAMUSCULAR | Status: DC | PRN
  Administered 2017-05-03: 25 ug via INTRAVENOUS
  Administered 2017-05-03: 100 ug via INTRAVENOUS
  Administered 2017-05-03: 50 ug via INTRAVENOUS
  Administered 2017-05-03: 75 ug via INTRAVENOUS
  Administered 2017-05-03: 50 ug via INTRAVENOUS
  Administered 2017-05-03: 100 ug via INTRAVENOUS

## 2017-05-03 MED ORDER — CHLORHEXIDINE GLUCONATE 4 % EX LIQD: 60.0000 mL | Freq: Once | CUTANEOUS | Status: DC

## 2017-05-03 MED ORDER — GEMFIBROZIL 600 MG PO TABS
600.0000 mg | ORAL_TABLET | Freq: Two times a day (BID) | ORAL | Status: DC
  Administered 2017-05-04: 600 mg via ORAL
  Filled 2017-05-03: qty 1

## 2017-05-03 MED ORDER — PROPOFOL 10 MG/ML IV BOLUS
INTRAVENOUS | Status: DC | PRN
  Administered 2017-05-03: 200 mg via INTRAVENOUS

## 2017-05-03 MED ORDER — LIDOCAINE HCL (PF) 1 % IJ SOLN
INTRAMUSCULAR | Status: AC
  Filled 2017-05-03: qty 30

## 2017-05-03 MED ORDER — ALUM & MAG HYDROXIDE-SIMETH 200-200-20 MG/5ML PO SUSP: 15.0000 mL | ORAL | Status: DC | PRN

## 2017-05-03 MED ORDER — MORPHINE SULFATE (PF) 2 MG/ML IV SOLN: 1.0000 mg | INTRAVENOUS | Status: DC | PRN

## 2017-05-03 MED ORDER — DEXTROSE 5 % IV SOLN
1.5000 g | Freq: Two times a day (BID) | INTRAVENOUS | Status: DC
  Administered 2017-05-04: 1.5 g via INTRAVENOUS
  Filled 2017-05-03 (×2): qty 1.5

## 2017-05-03 MED ORDER — BISACODYL 5 MG PO TBEC
5.0000 mg | DELAYED_RELEASE_TABLET | Freq: Every day | ORAL | Status: DC | PRN
Start: 2017-05-03 — End: 2017-05-04

## 2017-05-03 MED ORDER — PHENYLEPHRINE HCL 10 MG/ML IJ SOLN
INTRAVENOUS | Status: DC | PRN
  Administered 2017-05-03: 50 ug/min via INTRAVENOUS

## 2017-05-03 MED ORDER — PROPOFOL 10 MG/ML IV BOLUS
INTRAVENOUS | Status: AC
  Filled 2017-05-03: qty 20

## 2017-05-03 MED ORDER — DEXAMETHASONE SODIUM PHOSPHATE 10 MG/ML IJ SOLN
INTRAMUSCULAR | Status: AC
  Filled 2017-05-03: qty 1

## 2017-05-03 MED ORDER — METOPROLOL TARTRATE 5 MG/5ML IV SOLN: 2.0000 mg | INTRAVENOUS | Status: DC | PRN

## 2017-05-03 MED ORDER — ENOXAPARIN SODIUM 30 MG/0.3ML ~~LOC~~ SOLN
30.0000 mg | SUBCUTANEOUS | Status: DC
  Administered 2017-05-04: 30 mg via SUBCUTANEOUS
  Filled 2017-05-03: qty 0.3

## 2017-05-03 MED ORDER — SODIUM CHLORIDE 0.9 % IV SOLN
INTRAVENOUS | Status: DC | PRN
  Administered 2017-05-03: 500 mL

## 2017-05-03 MED ORDER — OXYCODONE HCL 5 MG PO TABS
ORAL_TABLET | ORAL | Status: AC
  Administered 2017-05-03: 10 mg via ORAL
  Filled 2017-05-03: qty 2

## 2017-05-03 MED ORDER — SENNOSIDES-DOCUSATE SODIUM 8.6-50 MG PO TABS: 1.0000 | ORAL_TABLET | Freq: Every evening | ORAL | Status: DC | PRN

## 2017-05-03 MED ORDER — DEXTROSE 5 % IV SOLN
1.5000 g | INTRAVENOUS | Status: AC
  Administered 2017-05-03: 1.5 g via INTRAVENOUS
  Filled 2017-05-03: qty 1.5

## 2017-05-03 MED ORDER — ROCURONIUM BROMIDE 100 MG/10ML IV SOLN
INTRAVENOUS | Status: DC | PRN
  Administered 2017-05-03: 30 mg via INTRAVENOUS
  Administered 2017-05-03: 50 mg via INTRAVENOUS

## 2017-05-03 MED ORDER — ACETAMINOPHEN 650 MG RE SUPP: 325.0000 mg | RECTAL | Status: DC | PRN

## 2017-05-03 MED ORDER — ASPIRIN EC 81 MG PO TBEC
81.0000 mg | DELAYED_RELEASE_TABLET | Freq: Every day | ORAL | Status: DC
  Administered 2017-05-04: 81 mg via ORAL
  Filled 2017-05-03: qty 1

## 2017-05-03 MED ORDER — HYDROMORPHONE HCL 1 MG/ML IJ SOLN: 0.2500 mg | INTRAMUSCULAR | Status: DC | PRN

## 2017-05-03 MED ORDER — LABETALOL HCL 5 MG/ML IV SOLN
10.0000 mg | INTRAVENOUS | Status: DC | PRN
  Filled 2017-05-03: qty 4

## 2017-05-03 MED ORDER — OXYCODONE HCL 5 MG PO TABS: 5.0000 mg | ORAL_TABLET | Freq: Once | ORAL | Status: DC | PRN

## 2017-05-03 MED ORDER — MUPIROCIN 2 % EX OINT
1.0000 "application " | TOPICAL_OINTMENT | Freq: Once | CUTANEOUS | Status: AC
  Administered 2017-05-03: 1 via TOPICAL

## 2017-05-03 MED ORDER — PHENOL 1.4 % MT LIQD: 1.0000 | OROMUCOSAL | Status: DC | PRN

## 2017-05-03 MED ORDER — CLOPIDOGREL BISULFATE 75 MG PO TABS
75.0000 mg | ORAL_TABLET | Freq: Every day | ORAL | Status: DC
  Administered 2017-05-03: 75 mg via ORAL
  Filled 2017-05-03: qty 1

## 2017-05-03 MED ORDER — ESMOLOL HCL 100 MG/10ML IV SOLN
INTRAVENOUS | Status: DC | PRN
  Administered 2017-05-03: 30 mg via INTRAVENOUS

## 2017-05-03 MED ORDER — SODIUM CHLORIDE 0.9 % IV SOLN
INTRAVENOUS | Status: DC
  Administered 2017-05-03: 22:00:00 via INTRAVENOUS

## 2017-05-03 MED ORDER — OXYCODONE HCL 5 MG PO TABS
5.0000 mg | ORAL_TABLET | ORAL | Status: DC | PRN
  Administered 2017-05-03: 10 mg via ORAL
  Administered 2017-05-04: 5 mg via ORAL
  Administered 2017-05-04: 10 mg via ORAL
  Filled 2017-05-03: qty 2
  Filled 2017-05-03: qty 1

## 2017-05-03 MED ORDER — 0.9 % SODIUM CHLORIDE (POUR BTL) OPTIME
TOPICAL | Status: DC | PRN
  Administered 2017-05-03: 2000 mL

## 2017-05-03 MED ORDER — OXYCODONE HCL 5 MG/5ML PO SOLN: 5.0000 mg | Freq: Once | ORAL | Status: DC | PRN

## 2017-05-03 MED ORDER — ONDANSETRON HCL 4 MG/2ML IJ SOLN
INTRAMUSCULAR | Status: AC
  Filled 2017-05-03: qty 2

## 2017-05-03 MED ORDER — LACTATED RINGERS IV SOLN
INTRAVENOUS | Status: DC
  Administered 2017-05-03 (×2): via INTRAVENOUS

## 2017-05-03 MED ORDER — GUAIFENESIN-DM 100-10 MG/5ML PO SYRP: 15.0000 mL | ORAL_SOLUTION | ORAL | Status: DC | PRN

## 2017-05-03 MED ORDER — PRAVASTATIN SODIUM 20 MG PO TABS
20.0000 mg | ORAL_TABLET | Freq: Every day | ORAL | Status: DC
  Administered 2017-05-03: 20 mg via ORAL
  Filled 2017-05-03: qty 1

## 2017-05-03 MED ORDER — ONDANSETRON HCL 4 MG/2ML IJ SOLN
INTRAMUSCULAR | Status: DC | PRN
  Administered 2017-05-03: 4 mg via INTRAVENOUS

## 2017-05-03 MED ORDER — DOCUSATE SODIUM 100 MG PO CAPS
100.0000 mg | ORAL_CAPSULE | Freq: Every day | ORAL | Status: DC
  Administered 2017-05-04: 100 mg via ORAL
  Filled 2017-05-03: qty 1

## 2017-05-03 MED ORDER — LIDOCAINE HCL (CARDIAC) 20 MG/ML IV SOLN
INTRAVENOUS | Status: DC | PRN
  Administered 2017-05-03: 80 mg via INTRAVENOUS

## 2017-05-03 MED ORDER — ACETAMINOPHEN 325 MG PO TABS: 325.0000 mg | ORAL_TABLET | ORAL | Status: DC | PRN

## 2017-05-03 SURGICAL SUPPLY — 48 items
ADH SKN CLS APL DERMABOND .7 (GAUZE/BANDAGES/DRESSINGS) ×1
ADH SKN CLS LQ APL DERMABOND (GAUZE/BANDAGES/DRESSINGS) ×1
CANISTER SUCT 3000ML PPV (MISCELLANEOUS) ×2 IMPLANT
CANNULA VESSEL 3MM 2 BLNT TIP (CANNULA) ×4 IMPLANT
CATH ROBINSON RED A/P 18FR (CATHETERS) ×2 IMPLANT
CLIP LIGATING EXTRA MED SLVR (CLIP) ×2 IMPLANT
CLIP LIGATING EXTRA SM BLUE (MISCELLANEOUS) ×2 IMPLANT
CRADLE DONUT ADULT HEAD (MISCELLANEOUS) ×2 IMPLANT
DECANTER SPIKE VIAL GLASS SM (MISCELLANEOUS) IMPLANT
DERMABOND ADHESIVE PROPEN (GAUZE/BANDAGES/DRESSINGS) ×1
DERMABOND ADVANCED (GAUZE/BANDAGES/DRESSINGS) ×1
DERMABOND ADVANCED .7 DNX12 (GAUZE/BANDAGES/DRESSINGS) ×1 IMPLANT
DERMABOND ADVANCED .7 DNX6 (GAUZE/BANDAGES/DRESSINGS) IMPLANT
DRAIN HEMOVAC 1/8 X 5 (WOUND CARE) IMPLANT
ELECT REM PT RETURN 9FT ADLT (ELECTROSURGICAL) ×2
ELECTRODE REM PT RTRN 9FT ADLT (ELECTROSURGICAL) ×1 IMPLANT
EVACUATOR SILICONE 100CC (DRAIN) IMPLANT
GLOVE BIOGEL PI IND STRL 6.5 (GLOVE) IMPLANT
GLOVE BIOGEL PI IND STRL 7.0 (GLOVE) IMPLANT
GLOVE BIOGEL PI IND STRL 7.5 (GLOVE) IMPLANT
GLOVE BIOGEL PI INDICATOR 6.5 (GLOVE) ×1
GLOVE BIOGEL PI INDICATOR 7.0 (GLOVE) ×1
GLOVE BIOGEL PI INDICATOR 7.5 (GLOVE) ×1
GLOVE ECLIPSE 7.0 STRL STRAW (GLOVE) ×1 IMPLANT
GLOVE SS BIOGEL STRL SZ 7.5 (GLOVE) ×1 IMPLANT
GLOVE SUPERSENSE BIOGEL SZ 7.5 (GLOVE) ×1
GOWN STRL REUS W/ TWL LRG LVL3 (GOWN DISPOSABLE) ×3 IMPLANT
GOWN STRL REUS W/TWL LRG LVL3 (GOWN DISPOSABLE) ×6
KIT BASIN OR (CUSTOM PROCEDURE TRAY) ×2 IMPLANT
KIT ROOM TURNOVER OR (KITS) ×2 IMPLANT
KIT SHUNT ARGYLE CAROTID ART 6 (VASCULAR PRODUCTS) IMPLANT
NEEDLE 22X1 1/2 (OR ONLY) (NEEDLE) IMPLANT
NS IRRIG 1000ML POUR BTL (IV SOLUTION) ×4 IMPLANT
PACK CAROTID (CUSTOM PROCEDURE TRAY) ×2 IMPLANT
PAD ARMBOARD 7.5X6 YLW CONV (MISCELLANEOUS) ×4 IMPLANT
PATCH HEMASHIELD 8X75 (Vascular Products) ×1 IMPLANT
SHUNT CAROTID BYPASS 10 (VASCULAR PRODUCTS) ×1 IMPLANT
SHUNT CAROTID BYPASS 12FRX15.5 (VASCULAR PRODUCTS) IMPLANT
SUT ETHILON 3 0 PS 1 (SUTURE) IMPLANT
SUT PROLENE 6 0 CC (SUTURE) ×3 IMPLANT
SUT SILK 3 0 (SUTURE)
SUT SILK 3-0 18XBRD TIE 12 (SUTURE) IMPLANT
SUT VIC AB 3-0 SH 27 (SUTURE) ×4
SUT VIC AB 3-0 SH 27X BRD (SUTURE) ×2 IMPLANT
SUT VICRYL 4-0 PS2 18IN ABS (SUTURE) ×2 IMPLANT
SYR CONTROL 10ML LL (SYRINGE) IMPLANT
TOWEL GREEN STERILE (TOWEL DISPOSABLE) ×2 IMPLANT
WATER STERILE IRR 1000ML POUR (IV SOLUTION) ×2 IMPLANT

## 2017-05-03 NOTE — Interval H&P Note (Signed)
History and Physical Interval Note:  05/03/2017 1:08 PM  Melvin Foley  has presented today for surgery, with the diagnosis of LEFT CAROTID STENOSIS  The various methods of treatment have been discussed with the patient and family. After consideration of risks, benefits and other options for treatment, the patient has consented to  Procedure(s): ENDARTERECTOMY CAROTID LEFT (Left) as a surgical intervention .  The patient's history has been reviewed, patient examined, no change in status, stable for surgery.  I have reviewed the patient's chart and labs.  Questions were answered to the patient's satisfaction.     Curt Jews

## 2017-05-03 NOTE — Anesthesia Procedure Notes (Signed)
Procedure Name: Intubation Date/Time: 05/03/2017 4:05 PM Performed by: White, Amedeo Plenty, CRNA Pre-anesthesia Checklist: Patient identified, Emergency Drugs available, Suction available and Patient being monitored Patient Re-evaluated:Patient Re-evaluated prior to induction Oxygen Delivery Method: Circle System Utilized Preoxygenation: Pre-oxygenation with 100% oxygen Induction Type: IV induction Ventilation: Mask ventilation without difficulty Laryngoscope Size: Mac and 4 Grade View: Grade II Tube type: Oral Tube size: 7.5 mm Number of attempts: 1 Airway Equipment and Method: Stylet and Oral airway Placement Confirmation: ETT inserted through vocal cords under direct vision,  positive ETCO2 and breath sounds checked- equal and bilateral Secured at: 22 cm Tube secured with: Tape Dental Injury: Teeth and Oropharynx as per pre-operative assessment

## 2017-05-03 NOTE — Transfer of Care (Signed)
Immediate Anesthesia Transfer of Care Note  Patient: Melvin Foley  Procedure(s) Performed: ENDARTERECTOMY CAROTID LEFT (Left Neck)  Patient Location: PACU  Anesthesia Type:General  Level of Consciousness: awake, alert  and patient cooperative  Airway & Oxygen Therapy: Patient Spontanous Breathing  Post-op Assessment: Report given to RN and Post -op Vital signs reviewed and stable  Post vital signs: Reviewed and stable  Last Vitals:  Vitals:   05/03/17 1156 05/03/17 1808  BP: (!) 157/97   Pulse: 69   Resp: 18   Temp: 36.7 C 36.7 C  SpO2: 99%     Last Pain:  Vitals:   05/03/17 1208  TempSrc:   PainSc: 0-No pain         Complications: No apparent anesthesia complications

## 2017-05-03 NOTE — Progress Notes (Signed)
Melvin Foley given to pt as requested

## 2017-05-03 NOTE — Op Note (Signed)
    OPERATIVE REPORT  DATE OF SURGERY: 05/03/2017  PATIENT: Melvin Foley, 72 y.o. male MRN: [id number]  DOB: [birth year]  PRE-OPERATIVE DIAGNOSIS: Symptomatic left internal carotid artery stenosis  POST-OPERATIVE DIAGNOSIS:  Same  PROCEDURE: Left carotid endarterectomy and Dacron patch angioplasty  SURGEON:  Curt Jews, M.D.  PHYSICIAN ASSISTANT: Dr. Juanda Crumble fields and Gerri Lins PA-C  ANESTHESIA: General  EBL: 100 ml  Total I/O In: 1200 [I.V.:1200] Out: 100 [Blood:100]  BLOOD ADMINISTERED: None  DRAINS: None  SPECIMEN: None  COUNTS CORRECT:  YES  PLAN OF CARE: PACU  PATIENT DISPOSITION:  PACU - hemodynamically stable  PROCEDURE DETAILS: The patient was taken to the operating room placed in supine position prepped and draped in usual sterile fashion.  Incision was made anterior sternocleidomastoid and carried down through the platysma with electrocautery.  The sternocleidomastoid reflected posteriorly and the carotid sheath was opened.  Facial vein was ligated with 2-0 silk ties and divided.  The vagus and hypoglossal nerves were identified and preserved.  The common carotid artery was encircled with an umbilical tape and Rummel tourniquet.  Dissection was continued onto the bifurcation in the superior thyroid artery was encircled with a 2-0 silk Potts tie.  The external carotid was encircled with a blue vessel loop and the internal carotid was encircled with an umbilical tape and Rummel tourniquet.  The patient was given 8000 units of intravenous heparin and after adequate circulation time the internal/external and common carotid arteries were occluded.  The common carotid artery was opened with an 11 blade and sent longitudinally through the plaque onto the internal carotid.  There was a critical stenosis with very irregular plaque that looks like there is been intraplaque hemorrhage.  The artery above this was normal.  A 10 shunt was passed up the internal  carotid and allowed to backbleed and then down the common carotid where it was secured with a Rummel tourniquet.  There was minimal backbleeding.  Endarterectomy was begun on the common carotid artery and the plaque was divided proximally with Potts scissors.  The endarterectomy was extended onto the bifurcation.  The external carotid was endarterectomized with an eversion technique and the internal carotid was endarterectomized in an open fashion.  Remaining atheromatous debris was removed from the endarterectomy plane.  A Finesse Hemashield Dacron patch was brought onto the field and was sewn as a patch angioplasty with a running 6-0 Prolene suture.  Prior to completion of the closure the shunt was removed.  The anastomosis was completed and flow was restored first to the external and then the internal carotid artery.  Excellent flow characteristics were noted with hand-held Doppler in the third arteries.  The patient was given 50 mg of protamine to reverse the heparin.  The wounds were irrigated with saline.  Hemostasis was obtained electrocautery.  The wounds were closed with 3-0 Vicryl to reapproximate the sternocleidomastoid over the carotid sheath.  Next the platysma was closed with a running 3-0 Vicryl suture and finally the skin was closed with a 4-0 subcuticular Vicryl stitch.  Sterile dressing was applied and the patient was transferred to the recovery room in stable condition neurologically intact   Rosetta Posner, M.D., [facility] 05/03/2017 6:07 PM

## 2017-05-03 NOTE — OR Nursing (Signed)
Patient's neuro checks intact prior to surgery in short stay.

## 2017-05-03 NOTE — Progress Notes (Signed)
Pt requesting a Database administrator. Pt states he forgot to bring his Cpap. And the fan will help him. O2 Sat on Ra 99%

## 2017-05-03 NOTE — Anesthesia Preprocedure Evaluation (Addendum)
Anesthesia Evaluation  Patient identified by MRN, date of birth, ID band Patient awake    Reviewed: Allergy & Precautions, NPO status , Patient's Chart, lab work & pertinent test results  Airway Mallampati: II  TM Distance: >3 FB Neck ROM: Full    Dental  (+) Teeth Intact, Dental Advisory Given   Pulmonary shortness of breath, sleep apnea , former smoker,    Pulmonary exam normal breath sounds clear to auscultation       Cardiovascular hypertension, + Peripheral Vascular Disease (L Carotid stenosis)  Normal cardiovascular exam Rhythm:Regular Rate:Normal  Echo 05/01/17:  - Left ventricle: The cavity size was normal. Systolic function was normal. The estimated ejection fraction was in the range of 55%to 60%. Wall motion was normal; there were no regional wallmotion abnormalities. Doppler parameters are consistent withabnormal left ventricular relaxation (grade 1 diastolic dysfunction). Doppler parameters are consistent with indeterminate mean left atrial filling pressure.     Neuro/Psych PSYCHIATRIC DISORDERS Anxiety Bipolar Disorder TIACVA    GI/Hepatic Neg liver ROS, hiatal hernia, GERD  Medicated,  Endo/Other  negative endocrine ROS  Renal/GU negative Renal ROS     Musculoskeletal  (+) Arthritis ,   Abdominal   Peds  Hematology negative hematology ROS (+) Blood dyscrasia (Plavix), ,   Anesthesia Other Findings Day of surgery medications reviewed with the patient.  Reproductive/Obstetrics                            Anesthesia Physical Anesthesia Plan  ASA: III  Anesthesia Plan: General   Post-op Pain Management:    Induction: Intravenous  PONV Risk Score and Plan: 2 and Dexamethasone and Ondansetron  Airway Management Planned: Oral ETT  Additional Equipment: Arterial line  Intra-op Plan:   Post-operative Plan: Extubation in OR  Informed Consent: I have reviewed the patients  History and Physical, chart, labs and discussed the procedure including the risks, benefits and alternatives for the proposed anesthesia with the patient or authorized representative who has indicated his/her understanding and acceptance.   Dental advisory given  Plan Discussed with: CRNA  Anesthesia Plan Comments:         Anesthesia Quick Evaluation

## 2017-05-04 ENCOUNTER — Telehealth: Payer: Self-pay | Admitting: Vascular Surgery

## 2017-05-04 ENCOUNTER — Encounter (HOSPITAL_COMMUNITY): Payer: Self-pay | Admitting: Vascular Surgery

## 2017-05-04 LAB — CBC
HEMATOCRIT: 35.9 % — AB (ref 39.0–52.0)
Hemoglobin: 12.2 g/dL — ABNORMAL LOW (ref 13.0–17.0)
MCH: 30.3 pg (ref 26.0–34.0)
MCHC: 34 g/dL (ref 30.0–36.0)
MCV: 89.3 fL (ref 78.0–100.0)
PLATELETS: 242 10*3/uL (ref 150–400)
RBC: 4.02 MIL/uL — ABNORMAL LOW (ref 4.22–5.81)
RDW: 12.1 % (ref 11.5–15.5)
WBC: 11.9 10*3/uL — AB (ref 4.0–10.5)

## 2017-05-04 LAB — BASIC METABOLIC PANEL
ANION GAP: 11 (ref 5–15)
BUN: 14 mg/dL (ref 6–20)
CALCIUM: 8.3 mg/dL — AB (ref 8.9–10.3)
CO2: 21 mmol/L — AB (ref 22–32)
Chloride: 105 mmol/L (ref 101–111)
Creatinine, Ser: 1.05 mg/dL (ref 0.61–1.24)
GFR calc Af Amer: >60 mL/min (ref >60)
GFR calc non Af Amer: >60 mL/min (ref >60)
GLUCOSE: 131 mg/dL — AB (ref 65–99)
Potassium: 4.3 mmol/L (ref 3.5–5.1)
Sodium: 137 mmol/L (ref 135–145)

## 2017-05-04 MED ORDER — OXYCODONE HCL 5 MG PO TABS: 5.0000 mg | ORAL_TABLET | Freq: Four times a day (QID) | ORAL | 0 refills | Status: DC | PRN

## 2017-05-04 NOTE — Discharge Instructions (Signed)
   [facility]  Discharge Instructions   Carotid Endarterectomy (CEA)  Please refer to the following instructions for your post-procedure care. Your surgeon or physician assistant will discuss any changes with you.  Activity  You are encouraged to walk as much as you can. You can slowly return to normal activities but must avoid strenuous activity and heavy lifting until your doctor tell you it's OK. Avoid activities such as vacuuming or swinging a golf club. You can drive after one week if you are comfortable and you are no longer taking prescription pain medications. It is normal to feel tired for serval weeks after your surgery. It is also normal to have difficulty with sleep habits, eating, and bowel movements after surgery. These will go away with time.  Bathing/Showering  You may shower after you come home. Do not soak in a bathtub, hot tub, or swim until the incision heals completely.  Incision Care  Shower every day. Clean your incision with mild soap and water. Pat the area dry with a clean towel. You do not need a bandage unless otherwise instructed. Do not apply any ointments or creams to your incision. You may have skin glue on your incision. Do not peel it off. It will come off on its own in about one week. Your incision may feel thickened and raised for several weeks after your surgery. This is normal and the skin will soften over time. For Men Only: It's OK to shave around the incision but do not shave the incision itself for 2 weeks. It is common to have numbness under your chin that could last for several months.  Diet  Resume your normal diet. There are no special food restrictions following this procedure. A low fat/low cholesterol diet is recommended for all patients with vascular disease. In order to heal from your surgery, it is CRITICAL to get adequate nutrition. Your body requires vitamins, minerals, and protein. Vegetables are the best  source of vitamins and minerals. Vegetables also provide the perfect balance of protein. Processed food has little nutritional value, so try to avoid this.        Medications  Resume taking all of your medications unless your doctor or physician assistant tells you not to. If your incision is causing pain, you may take over-the- counter pain relievers such as acetaminophen (Tylenol). If you were prescribed a stronger pain medication, please be aware these medications can cause nausea and constipation. Prevent nausea by taking the medication with a snack or meal. Avoid constipation by drinking plenty of fluids and eating foods with a high amount of fiber, such as fruits, vegetables, and grains. Do not take Tylenol if you are taking prescription pain medications.  Follow Up  Our office will schedule a follow up appointment 2-3 weeks following discharge.  Please call us immediately for any of the following conditions  Increased pain, redness, drainage (pus) from your incision site. Fever of 101 degrees or higher. If you should develop stroke (slurred speech, difficulty swallowing, weakness on one side of your body, loss of vision) you should call 911 and go to the nearest emergency room.  Reduce your risk of vascular disease:  Stop smoking. If you would like help call [facility] at 1-800-QUIT-NOW (1-800-784-8669) or [place] at 336-586-4000. Manage your cholesterol Maintain a desired weight Control your diabetes Keep your blood pressure down  If you have any questions, please call the office at [phone].   

## 2017-05-04 NOTE — Telephone Encounter (Signed)
Sched appt 06/06/17 at 4:00. Lm on hm# to inform pt of appt.

## 2017-05-04 NOTE — Telephone Encounter (Signed)
-----   Message from Mena Goes, RN sent at 05/04/2017 12:11 PM EST ----- Regarding: 2-3 weeks   ----- Message ----- From: Ulyses Amor, PA-C Sent: 05/04/2017   7:12 AM To: Vvs Charge Pool  S/P left CEA f/u with Dr. Donnetta Hutching in 2-3 weeks

## 2017-05-04 NOTE — Progress Notes (Signed)
Patient ID: Melvin Foley, male   DOB: [birth year], 72 y.o.   MRN: [id number] Comfortable this morning.  Minimal pain from his incision. .  Swallowing without difficulty. Neurologically intact.  Neck without hematoma. Stable for discharge today

## 2017-05-04 NOTE — Anesthesia Postprocedure Evaluation (Signed)
Anesthesia Post Note  Patient: Melvin Foley  Procedure(s) Performed: ENDARTERECTOMY CAROTID LEFT (Left Neck)     Patient location during evaluation: PACU Anesthesia Type: General Level of consciousness: awake and alert Pain management: pain level controlled Vital Signs Assessment: post-procedure vital signs reviewed and stable Respiratory status: spontaneous breathing, nonlabored ventilation, respiratory function stable and patient connected to nasal cannula oxygen Cardiovascular status: blood pressure returned to baseline and stable Postop Assessment: no apparent nausea or vomiting Anesthetic complications: no    Last Vitals:  Vitals:   05/04/17 1020 05/04/17 1030  BP: (!) 110/93 115/90  Pulse:  70  Resp: 12 (!) 21  Temp:  36.8 C  SpO2: 94% 95%    Last Pain:  Vitals:   05/04/17 1030  TempSrc:   PainSc: 6                  Ryan P Ellender

## 2017-05-06 ENCOUNTER — Encounter (HOSPITAL_BASED_OUTPATIENT_CLINIC_OR_DEPARTMENT_OTHER): Payer: Self-pay | Admitting: Emergency Medicine

## 2017-05-06 ENCOUNTER — Other Ambulatory Visit: Payer: Self-pay

## 2017-05-06 ENCOUNTER — Emergency Department (HOSPITAL_BASED_OUTPATIENT_CLINIC_OR_DEPARTMENT_OTHER)
Admission: EM | Admit: 2017-05-06 | Discharge: 2017-05-06 | Disposition: A | Discharge status: Home / Self Care | Payer: Non-veteran care | Attending: Emergency Medicine | Admitting: Emergency Medicine

## 2017-05-06 DIAGNOSIS — Z7902 Long term (current) use of antithrombotics/antiplatelets: Secondary | ICD-10-CM | POA: Insufficient documentation

## 2017-05-06 DIAGNOSIS — R600 Localized edema: Secondary | ICD-10-CM | POA: Diagnosis present

## 2017-05-06 DIAGNOSIS — Z79899 Other long term (current) drug therapy: Secondary | ICD-10-CM | POA: Insufficient documentation

## 2017-05-06 DIAGNOSIS — I10 Essential (primary) hypertension: Secondary | ICD-10-CM | POA: Diagnosis not present

## 2017-05-06 DIAGNOSIS — L03211 Cellulitis of face: Secondary | ICD-10-CM | POA: Diagnosis not present

## 2017-05-06 DIAGNOSIS — Z87891 Personal history of nicotine dependence: Secondary | ICD-10-CM | POA: Insufficient documentation

## 2017-05-06 DIAGNOSIS — Z7982 Long term (current) use of aspirin: Secondary | ICD-10-CM | POA: Diagnosis not present

## 2017-05-06 MED ORDER — MUPIROCIN CALCIUM 2 % EX CREA
TOPICAL_CREAM | Freq: Once | CUTANEOUS | Status: AC
  Administered 2017-05-06: 20:00:00 via TOPICAL
  Filled 2017-05-06: qty 15

## 2017-05-06 MED ORDER — SULFAMETHOXAZOLE-TRIMETHOPRIM 800-160 MG PO TABS: 1.0000 | ORAL_TABLET | Freq: Two times a day (BID) | ORAL | 0 refills | Status: AC

## 2017-05-06 MED ORDER — MUPIROCIN 2 % EX OINT
TOPICAL_OINTMENT | CUTANEOUS | Status: AC
  Administered 2017-05-06: 1
  Filled 2017-05-06: qty 22

## 2017-05-06 NOTE — ED Provider Notes (Signed)
[facility] EMERGENCY DEPARTMENT Provider Note   CSN: [id number] Arrival date & time: 05/06/17  1827     History   Chief Complaint Chief Complaint  Patient presents with  . Abscess    HPI Melvin Foley is a 72 y.o. male.  Patient is a 72 year old male presenting today with redness and mild swelling to the left cheek that started 3-4 days ago.  Of note patient recently had a left-sided endarterectomy last week without complication.  He denies any pain in the area, fever or feeling ill.  He has had no drainage.   The history is provided by the patient.  Abscess  Location:  Face Facial abscess location:  L cheek Size:  Pea sized Abscess quality: not draining, no fluctuance, not painful and no redness   Red streaking: no   Progression:  Unchanged Chronicity:  New Context: not diabetes and not injected drug use   Context comment:  She was recently intubated for his endarterectomy and went of had a tube holder in place that would have most likely been taped to that area Relieved by:  None tried Worsened by:  Nothing Ineffective treatments:  None tried Risk factors: no hx of MRSA and no prior abscess     Past Medical History:  Diagnosis Date  . Anxiety    takes medication  . Arthritis   . Bipolar 1 disorder ([place])   . Bronchitis   . Carotid stenosis, left   . GERD (gastroesophageal reflux disease)   . H/O hiatal hernia   . Hypertension   . Shortness of breath   . Sleep apnea    2 YEARS  [facility]   . Stroke [facility])     Patient Active Problem List   Diagnosis Date Noted  . Carotid stenosis 05/03/2017  . Difficulty with speech   . TIA (transient ischemic attack) 04/30/2017  . Hyperlipidemia 04/30/2017  . Proximal humerus fracture 03/02/2011    Past Surgical History:  Procedure Laterality Date  . CHOLECYSTECTOMY    . ENDARTERECTOMY Left 05/03/2017   Procedure: ENDARTERECTOMY CAROTID LEFT;  Surgeon: Rosetta Posner, MD;  Location: [place];   Service: Vascular;  Laterality: Left;  . FRACTURE SURGERY     right ankle  . head injury    . KNEE SURGERY     x2  . ORIF SHOULDER FRACTURE  03/03/2011   Procedure: OPEN REDUCTION INTERNAL FIXATION (ORIF) SHOULDER FRACTURE;  Surgeon: Augustin Schooling;  Location: [place];  Service: Orthopedics;  Laterality: Left;  LEFT PROXIMAL HUMERUS Open Reduction internal fixation  . SHOULDER ARTHROSCOPY WITH SUBACROMIAL DECOMPRESSION AND BICEP TENDON REPAIR  02/24/2012   Procedure: SHOULDER ARTHROSCOPY WITH SUBACROMIAL DECOMPRESSION AND BICEP TENDON REPAIR;  Surgeon: Augustin Schooling, MD;  Location: [place];  Service: Orthopedics;  Laterality: Left;  with capsular release and lysis of adhesions  . TONSILLECTOMY         Home Medications    Prior to Admission medications   Medication Sig Start Date End Date Taking? Authorizing Provider  aspirin 81 MG tablet Take 81 mg by mouth daily.     [provider]  clopidogrel (PLAVIX) 75 MG tablet Take 1 tablet (75 mg total) by mouth at bedtime. 05/01/17   Geradine Girt, DO  gemfibrozil (LOPID) 600 MG tablet Take 600 mg by mouth 2 (two) times daily before a meal.    [provider]  omeprazole (PRILOSEC) 20 MG capsule Take 20 mg by mouth daily.  [provider]  oxyCODONE (OXY IR/ROXICODONE) 5 MG immediate release tablet Take 1 tablet (5 mg total) by mouth every 6 (six) hours as needed for moderate pain. 05/04/17   Ulyses Amor, PA-C  pravastatin (PRAVACHOL) 20 MG tablet Take 20 mg by mouth daily.    [provider]  sulfamethoxazole-trimethoprim (BACTRIM DS,SEPTRA DS) 800-160 MG tablet Take 1 tablet by mouth 2 (two) times daily for 7 days. Start if area on the face worsens 05/06/17 05/13/17  Blanchie Dessert, MD  thiothixene (NAVANE) 5 MG capsule Take 5 mg by mouth 3 (three) times daily.      [provider]    Family History Family History  Problem Relation Age of Onset  . Congestive Heart Failure Mother   .  Stroke Mother   . Congestive Heart Failure Father     Social History Social History   Tobacco Use  . Smoking status: Former Smoker    Packs/day: 1.50  . Smokeless tobacco: Never Used  . Tobacco comment: stopped smoking 20 years ago  Substance Use Topics  . Alcohol use: No    Comment: past 20 years ago  . Drug use: No     Allergies   Lipitor [atorvastatin]   Review of Systems Review of Systems  All other systems reviewed and are negative.    Physical Exam Updated Vital Signs BP 134/79 (BP Location: Right Arm)   Pulse 73   Temp 98.6 F (37 C) (Oral)   Resp 18   Ht 5\' 7"  (1.702 m)   Wt 72.6 kg (160 lb)   SpO2 100%   BMI 25.06 kg/m   Physical Exam  Constitutional: He is oriented to person, place, and time. He appears well-developed and well-nourished. No distress.  HENT:  Head: Normocephalic and atraumatic.    Eyes: EOM are normal. Pupils are equal, round, and reactive to light.  Neck:  Healing left-sided endarterectomy incision  Cardiovascular: Normal rate.  Pulmonary/Chest: Effort normal.  Neurological: He is alert and oriented to person, place, and time.  Skin: Skin is warm and dry.  Psychiatric: He has a normal mood and affect. His behavior is normal.  Nursing note and vitals reviewed.    ED Treatments / Results  Labs (all labs ordered are listed, but only abnormal results are displayed) Labs Reviewed - No data to display  EKG  EKG Interpretation None       Radiology No results found.  Procedures Procedures (including critical care time)  Medications Ordered in ED Medications  mupirocin cream (BACTROBAN) 2 % (not administered)     Initial Impression / Assessment and Plan / ED Course  I have reviewed the triage vital signs and the nursing notes.  Pertinent labs & imaging results that were available during my care of the patient were reviewed by me and considered in my medical decision making (see chart for details).     Patient  presenting today with a small area on the right left cheek consistent with staph or early abscess.  No redness tracking or drainage at this time.  Patient is not systemically ill.  Will start with mupirocin ointment and warm soaks.  However patient instructed that if symptoms started worsening he was given a prescription for some oral antibiotics.  Final Clinical Impressions(s) / ED Diagnoses   Final diagnoses:  Facial cellulitis    ED Discharge Orders        Ordered    sulfamethoxazole-trimethoprim (BACTRIM DS,SEPTRA DS) 800-160 MG tablet  2  times daily     05/06/17 2007       Blanchie Dessert, MD 05/06/17 2016

## 2017-05-06 NOTE — ED Notes (Signed)
EDP into room, prior to RN assessment, see MD notes, orders received to medicate and d/c, initiated.   Alert, NAD, calm, interactive, resps e/u, speaking in clear complete sentences, no dyspnea noted, skin W&D, initial VSS, steady gait, here for ~1-1.5cm area of irritating raised redness adjacent to L nasal ala developed since recent hospitalization(placed on ventilator), (denies: pain, sob, fever, NVD, dizziness or visual changes). Family at [facility].

## 2017-05-06 NOTE — Discharge Instructions (Signed)
Ointment on your face 3 times a day and do warm compresses 2-3 times a day for about 20 minutes.  If the area starts getting worse then you can start the antibiotics.

## 2017-05-06 NOTE — ED Triage Notes (Signed)
Patient states that he started to have a bump on his face. The patient has a noted area of reddness with white head on top to the left of his now. Denies any pain.

## 2017-05-24 NOTE — Discharge Summary (Signed)
[facility] Discharge Summary   Patient ID:  Melvin Foley MRN: [id number] DOB/AGE: March 11, 1946 72 y.o.  Admit date: 05/03/2017 Discharge date: 05/04/2017 Date of Surgery: 05/03/2017 Surgeon: Surgeon(s): Early, Arvilla Meres, MD Elam Dutch, MD  Admission Diagnosis: LEFT CAROTID STENOSIS  Discharge Diagnoses:  LEFT CAROTID STENOSIS  Secondary Diagnoses: Past Medical History:  Diagnosis Date  . Anxiety    takes medication  . Arthritis   . Bipolar 1 disorder ([place])   . Bronchitis   . Carotid stenosis, left   . GERD (gastroesophageal reflux disease)   . H/O hiatal hernia   . Hypertension   . Shortness of breath   . Sleep apnea    2 YEARS  [place]   . Stroke [facility])     Procedure(s): ENDARTERECTOMY CAROTID LEFT  Discharged Condition: good  HPI: 72 y/o male with recent TIA event with speech difficulty at presentation to the ED.  His symptoms resolved rather [place]. He also has a history of previous TIA was seen in consultation 05/01/2017 by Dr. Donnetta Hutching.  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  CTA head and neck also performed which demonstrated 20% stenosis of right ICA, 80% stenosis of proximal left ICA as well as severely stenotic posterior circulation involving basilar system.      Hospital Course:  Melvin Foley is a 72 y.o. male is S/P Procedure(s): ENDARTERECTOMY CAROTID LEFT Post op recovery was uneventful.  He denise speech problems, swallowing problems and no neurologic deficits.  He was discharged home in stable condition.     Significant Diagnostic Studies: CBC Lab Results  Component Value Date   WBC 11.9 (H) 05/04/2017   HGB 12.2 (L) 05/04/2017   HCT 35.9 (L) 05/04/2017   MCV 89.3 05/04/2017   PLT 242 05/04/2017    BMET    Component Value Date/Time   NA 137 05/04/2017 0321   K 4.3 05/04/2017 0321   CL 105 05/04/2017 0321   CO2 21 (L)  05/04/2017 0321   GLUCOSE 131 (H) 05/04/2017 0321   BUN 14 05/04/2017 0321   CREATININE 1.05 05/04/2017 0321   CALCIUM 8.3 (L) 05/04/2017 0321   GFRNONAA >60 05/04/2017 0321   GFRAA >60 05/04/2017 0321   COAG Lab Results  Component Value Date   INR 1.01 04/30/2017   INR 1.05 03/02/2011     Disposition:  Discharge to :Home Discharge Instructions    Call MD for:  redness, tenderness, or signs of infection (pain, swelling, bleeding, redness, odor or green/yellow discharge around incision site)   Complete by:  As directed    Call MD for:  severe or increased pain, loss or decreased feeling  in affected limb(s)   Complete by:  As directed    Call MD for:  temperature >100.5   Complete by:  As directed    Resume previous diet   Complete by:  As directed      Allergies as of 05/04/2017      Reactions   Lipitor [atorvastatin] Other (See Comments)   Myalgias      Medication List    TAKE these medications   aspirin 81 MG tablet Take 81 mg by mouth daily.   clopidogrel 75 MG tablet Commonly known as:  PLAVIX Take 1 tablet (75 mg total) by mouth at bedtime.   gemfibrozil 600 MG tablet Commonly known as:  LOPID Take 600 mg by mouth 2 (two)  times daily before a meal.   omeprazole 20 MG capsule Commonly known as:  PRILOSEC Take 20 mg by mouth daily.   oxyCODONE 5 MG immediate release tablet Commonly known as:  Oxy IR/ROXICODONE Take 1 tablet (5 mg total) by mouth every 6 (six) hours as needed for moderate pain.   pravastatin 20 MG tablet Commonly known as:  PRAVACHOL Take 20 mg by mouth daily.   thiothixene 5 MG capsule Commonly known as:  NAVANE Take 5 mg by mouth 3 (three) times daily.      Verbal and written Discharge instructions given to the patient. Wound care per Discharge AVS Follow-up Information    Early, Arvilla Meres, MD Follow up in 2 week(s).   Specialties:  Vascular Surgery, Cardiology Why:  office will call Contact information: [address] [place] [address] [phone]           Signed: Roxy Horseman 05/24/2017, 10:00 AM --- For VQI Registry use --- Instructions: Press F2 to tab through selections.  Delete question if not applicable.   Modified Rankin score at D/C (0-6): Rankin Score=0  IV medication needed for:  1. Hypertension: No 2. Hypotension: No  Post-op Complications: No  1. Post-op CVA or TIA: No  If yes: Event classification (right eye, left eye, right cortical, left cortical, verterobasilar, other):   If yes: Timing of event (intra-op, <6 hrs post-op, >=6 hrs post-op, unknown):   2. CN injury: No  If yes: CN  injuried   3. Myocardial infarction: No  If yes: Dx by (EKG or clinical, Troponin):   4.  CHF: No  5.  Dysrhythmia (new): No  6. Wound infection: No  7. Reperfusion symptoms: No  8. Return to OR: No  If yes: return to OR for (bleeding, neurologic, other CEA incision, other):   Discharge medications: Statin use:  Yes ASA use:  Yes Beta blocker use:  No  for medical reason   ACE-Inhibitor use:  No  for medical reason   P2Y12 Antagonist use: [ x] None, [ ]  Plavix, [ ]  Plasugrel, [ ]  Ticlopinine, [ ]  Ticagrelor, [ ]  Other, [ ]  No for medical reason, [ ]  Non-compliant, [ ]  Not-indicated Anti-coagulant use:  [ x] None, [ ]  Warfarin, [ ]  Rivaroxaban, [ ]  Dabigatran, [ ]  Other, [ ]  No for medical reason, [ ]  Non-compliant, [ ]  Not-indicated [x] Aspirin

## 2017-06-06 ENCOUNTER — Ambulatory Visit (INDEPENDENT_AMBULATORY_CARE_PROVIDER_SITE_OTHER): Payer: Medicare Other | Admitting: Vascular Surgery

## 2017-06-06 ENCOUNTER — Encounter: Payer: Self-pay | Admitting: Vascular Surgery

## 2017-06-06 ENCOUNTER — Other Ambulatory Visit: Payer: Self-pay

## 2017-06-06 VITALS — BP 112/76 | HR 62 | Temp 97.1°F | Resp 20 | Ht 67.0 in | Wt 175.0 lb

## 2017-06-06 DIAGNOSIS — I6522 Occlusion and stenosis of left carotid artery: Secondary | ICD-10-CM

## 2017-06-06 NOTE — Progress Notes (Signed)
   Patient name: Melvin Foley MRN: [id number] DOB: Mar 22, 1946 Sex: male  REASON FOR VISIT: Follow-up left carotid stenosis for symptomatic disease  HPI: Melvin Foley is a 72 y.o. male here today for follow-up.  He underwent uneventful left carotid endarterectomy on 05/03/2017 for symptomatic carotid disease.  He is doing well since his discharge from the hospital has had no neurologic deficits  Current Outpatient Medications  Medication Sig Dispense Refill  . aspirin 81 MG tablet Take 81 mg by mouth daily.     . clopidogrel (PLAVIX) 75 MG tablet Take 1 tablet (75 mg total) by mouth at bedtime. 30 tablet 0  . gemfibrozil (LOPID) 600 MG tablet Take 600 mg by mouth 2 (two) times daily before a meal.    . omeprazole (PRILOSEC) 20 MG capsule Take 20 mg by mouth daily.    . pravastatin (PRAVACHOL) 20 MG tablet Take 20 mg by mouth daily.    Marland Kitchen thiothixene (NAVANE) 5 MG capsule Take 5 mg by mouth 3 (three) times daily.       No current facility-administered medications for this visit.      PHYSICAL EXAM: Vitals:   06/06/17 1542 06/06/17 1544  BP: 125/82 112/76  Pulse: 62   Resp: 20   Temp: (!) 97.1 F (36.2 C)   TempSrc: Oral   SpO2: 98%   Weight: 175 lb (79.4 kg)   Height: 5\' 7"  (1.702 m)     GENERAL: The patient is a well-nourished male, in no acute distress. The vital signs are documented above. Left neck is completely healed with no bruits bilaterally.  Neurologically intact  MEDICAL ISSUES: Stable status post carotid endarterectomy.  Will resume full activity without limitation.  Plan to see him again in 6 months with repeat carotid duplex.  He will notify should he develop any new deficits or wound issues   Rosetta Posner, MD [facility] of [facility] Tel [phone] Pager [phone]

## 2017-06-07 NOTE — Addendum Note (Signed)
Addended by: Lianne Cure A on: 06/07/2017 12:29 PM   Modules accepted: Orders

## 2017-06-22 ENCOUNTER — Ambulatory Visit: Payer: Self-pay | Admitting: Adult Health

## 2017-12-05 ENCOUNTER — Ambulatory Visit: Payer: Non-veteran care | Admitting: Vascular Surgery

## 2017-12-05 ENCOUNTER — Encounter (HOSPITAL_COMMUNITY): Payer: Non-veteran care

## 2017-12-26 ENCOUNTER — Encounter (HOSPITAL_COMMUNITY): Payer: Non-veteran care

## 2017-12-26 ENCOUNTER — Ambulatory Visit: Payer: Non-veteran care | Admitting: Vascular Surgery

## 2018-01-16 ENCOUNTER — Other Ambulatory Visit: Payer: Self-pay

## 2018-01-16 DIAGNOSIS — I6522 Occlusion and stenosis of left carotid artery: Secondary | ICD-10-CM

## 2018-01-30 ENCOUNTER — Encounter: Payer: Self-pay | Admitting: Vascular Surgery

## 2018-01-30 ENCOUNTER — Ambulatory Visit (HOSPITAL_COMMUNITY)
Admission: RE | Admit: 2018-01-30 | Discharge: 2018-01-30 | Disposition: A | Discharge status: Home / Self Care | Payer: No Typology Code available for payment source | Source: Ambulatory Visit | Attending: Vascular Surgery | Admitting: Vascular Surgery

## 2018-01-30 ENCOUNTER — Other Ambulatory Visit: Payer: Self-pay

## 2018-01-30 ENCOUNTER — Ambulatory Visit (INDEPENDENT_AMBULATORY_CARE_PROVIDER_SITE_OTHER): Payer: No Typology Code available for payment source | Admitting: Vascular Surgery

## 2018-01-30 VITALS — BP 123/80 | HR 67 | Resp 18 | Ht 67.0 in | Wt 176.1 lb

## 2018-01-30 DIAGNOSIS — I6522 Occlusion and stenosis of left carotid artery: Secondary | ICD-10-CM | POA: Insufficient documentation

## 2018-01-30 NOTE — Progress Notes (Signed)
Vascular and Vein Specialist of [place]  Patient name: Melvin Foley MRN: [id number] DOB: 04-Mar-1946 Sex: male  REASON FOR VISIT: Low up left carotid endarterectomy for severe symptomatic stenosis on 05/03/2017  HPI: Melvin Foley is a 72 y.o. male here today for follow-up.  He had ventral endarterectomy for symptomatic disease in January 2019.  He does report some mild peri-incisional numbness but is had no new neurologic deficits.  He has had no new cardiac or other medical difficulty since my last visit with him.  Past Medical History:  Diagnosis Date  . Anxiety    takes medication  . Arthritis   . Bipolar 1 disorder ([place])   . Bronchitis   . Carotid stenosis, left   . GERD (gastroesophageal reflux disease)   . H/O hiatal hernia   . Hypertension   . Shortness of breath   . Sleep apnea    2 YEARS  [place]   . Stroke [facility])     Family History  Problem Relation Age of Onset  . Congestive Heart Failure Mother   . Stroke Mother   . Congestive Heart Failure Father     SOCIAL HISTORY: Social History   Tobacco Use  . Smoking status: Former Smoker    Packs/day: 1.50  . Smokeless tobacco: Never Used  . Tobacco comment: stopped smoking 20 years ago  Substance Use Topics  . Alcohol use: No    Comment: past 20 years ago    Allergies  Allergen Reactions  . Lipitor [Atorvastatin] Other (See Comments)    Myalgias    Current Outpatient Medications  Medication Sig Dispense Refill  . aspirin 81 MG tablet Take 81 mg by mouth daily.     . clopidogrel (PLAVIX) 75 MG tablet Take 1 tablet (75 mg total) by mouth at bedtime. 30 tablet 0  . gemfibrozil (LOPID) 600 MG tablet Take 600 mg by mouth 2 (two) times daily before a meal.    . omeprazole (PRILOSEC) 20 MG capsule Take 20 mg by mouth daily.    . pravastatin (PRAVACHOL) 20 MG tablet Take 20 mg by mouth daily.    Marland Kitchen thiothixene (NAVANE) 5 MG capsule Take 5 mg by mouth 3  (three) times daily.       No current facility-administered medications for this visit.     REVIEW OF SYSTEMS:  [X]  denotes positive finding, [ ]  denotes negative finding Cardiac  Comments:  Chest pain or chest pressure:    Shortness of breath upon exertion:    Short of breath when lying flat:    Irregular heart rhythm:        Vascular    Pain in calf, thigh, or hip brought on by ambulation:    Pain in feet at night that wakes you up from your sleep:     Blood clot in your veins:    Leg swelling:           PHYSICAL EXAM: Vitals:   01/30/18 1546  BP: 123/80  Pulse: 67  Resp: 18  SpO2: 96%  Weight: 176 lb 1.6 oz (79.9 kg)  Height: 5\' 7"  (1.702 m)    GENERAL: The patient is a well-nourished male, in no acute distress. The vital signs are documented above. CARDIOVASCULAR: Well-healed left neck incision with no bruits present.  Plus radial pulses bilaterally PULMONARY: There is good air exchange  MUSCULOSKELETAL: There are no major deformities or cyanosis. NEUROLOGIC: No focal weakness or paresthesias are detected. SKIN: There are  no ulcers or rashes noted. PSYCHIATRIC: The patient has a normal affect.  DATA:  Carotid duplex reveals widely patent endarterectomy with no recurrent stenosis.  Mild plaque on the right carotid with no significant stenosis  MEDICAL ISSUES: Stable status post endarterectomy January 2019.  Will be seen again in 1 year with repeat carotid duplex.    Rosetta Posner, MD FACS [facility] of [facility] Tel [phone] Pager [phone]

## 2018-05-22 ENCOUNTER — Encounter (HOSPITAL_BASED_OUTPATIENT_CLINIC_OR_DEPARTMENT_OTHER): Payer: Self-pay

## 2018-05-22 ENCOUNTER — Emergency Department (HOSPITAL_BASED_OUTPATIENT_CLINIC_OR_DEPARTMENT_OTHER)
Admission: EM | Admit: 2018-05-22 | Discharge: 2018-05-22 | Disposition: A | Discharge status: Home / Self Care | Payer: Non-veteran care | Attending: Emergency Medicine | Admitting: Emergency Medicine

## 2018-05-22 ENCOUNTER — Other Ambulatory Visit: Payer: Self-pay

## 2018-05-22 DIAGNOSIS — Z5321 Procedure and treatment not carried out due to patient leaving prior to being seen by health care provider: Secondary | ICD-10-CM | POA: Insufficient documentation

## 2018-05-22 DIAGNOSIS — R042 Hemoptysis: Secondary | ICD-10-CM | POA: Insufficient documentation

## 2018-05-22 NOTE — ED Notes (Signed)
Spoke with pt at length about ED process-states he may not wait to be seen [facility]-advised to let staff know if he leaves

## 2018-05-22 NOTE — ED Triage Notes (Signed)
C/o "coughing up blood" x today-reports not feeling well x 20 weeks-cough x 6 weeks-[facility]-steady gait

## 2019-08-29 ENCOUNTER — Emergency Department (HOSPITAL_BASED_OUTPATIENT_CLINIC_OR_DEPARTMENT_OTHER): Payer: No Typology Code available for payment source

## 2019-08-29 ENCOUNTER — Encounter (HOSPITAL_BASED_OUTPATIENT_CLINIC_OR_DEPARTMENT_OTHER): Payer: Self-pay | Admitting: *Deleted

## 2019-08-29 ENCOUNTER — Other Ambulatory Visit: Payer: Self-pay

## 2019-08-29 ENCOUNTER — Emergency Department (HOSPITAL_BASED_OUTPATIENT_CLINIC_OR_DEPARTMENT_OTHER)
Admission: EM | Admit: 2019-08-29 | Discharge: 2019-08-29 | Disposition: A | Discharge status: Home / Self Care | Payer: No Typology Code available for payment source | Attending: Emergency Medicine | Admitting: Emergency Medicine

## 2019-08-29 DIAGNOSIS — Z87891 Personal history of nicotine dependence: Secondary | ICD-10-CM | POA: Diagnosis not present

## 2019-08-29 DIAGNOSIS — Z7982 Long term (current) use of aspirin: Secondary | ICD-10-CM | POA: Diagnosis not present

## 2019-08-29 DIAGNOSIS — I10 Essential (primary) hypertension: Secondary | ICD-10-CM | POA: Diagnosis not present

## 2019-08-29 DIAGNOSIS — R042 Hemoptysis: Secondary | ICD-10-CM

## 2019-08-29 DIAGNOSIS — Z9049 Acquired absence of other specified parts of digestive tract: Secondary | ICD-10-CM | POA: Diagnosis not present

## 2019-08-29 DIAGNOSIS — Z7902 Long term (current) use of antithrombotics/antiplatelets: Secondary | ICD-10-CM | POA: Insufficient documentation

## 2019-08-29 DIAGNOSIS — Z8673 Personal history of transient ischemic attack (TIA), and cerebral infarction without residual deficits: Secondary | ICD-10-CM | POA: Insufficient documentation

## 2019-08-29 DIAGNOSIS — Z79899 Other long term (current) drug therapy: Secondary | ICD-10-CM | POA: Diagnosis not present

## 2019-08-29 DIAGNOSIS — C3431 Malignant neoplasm of lower lobe, right bronchus or lung: Secondary | ICD-10-CM

## 2019-08-29 LAB — CBC WITH DIFFERENTIAL/PLATELET
Abs Immature Granulocytes: 0.02 10*3/uL (ref 0.00–0.07)
Basophils Absolute: 0.1 10*3/uL (ref 0.0–0.1)
Basophils Relative: 1 %
Eosinophils Absolute: 0.9 10*3/uL — ABNORMAL HIGH (ref 0.0–0.5)
Eosinophils Relative: 11 %
HCT: 42 % (ref 39.0–52.0)
Hemoglobin: 14.1 g/dL (ref 13.0–17.0)
Immature Granulocytes: 0 %
Lymphocytes Relative: 22 %
Lymphs Abs: 1.9 10*3/uL (ref 0.7–4.0)
MCH: 29.7 pg (ref 26.0–34.0)
MCHC: 33.6 g/dL (ref 30.0–36.0)
MCV: 88.6 fL (ref 80.0–100.0)
Monocytes Absolute: 0.7 10*3/uL (ref 0.1–1.0)
Monocytes Relative: 8 %
Neutro Abs: 4.8 10*3/uL (ref 1.7–7.7)
Neutrophils Relative %: 58 %
Platelets: 311 10*3/uL (ref 150–400)
RBC: 4.74 MIL/uL (ref 4.22–5.81)
RDW: 12.7 % (ref 11.5–15.5)
WBC: 8.4 10*3/uL (ref 4.0–10.5)
nRBC: 0 % (ref 0.0–0.2)

## 2019-08-29 LAB — COMPREHENSIVE METABOLIC PANEL
ALT: 12 U/L (ref 0–44)
AST: 16 U/L (ref 15–41)
Albumin: 3.8 g/dL (ref 3.5–5.0)
Alkaline Phosphatase: 58 U/L (ref 38–126)
Anion gap: 10 (ref 5–15)
BUN: 20 mg/dL (ref 8–23)
CO2: 23 mmol/L (ref 22–32)
Calcium: 9 mg/dL (ref 8.9–10.3)
Chloride: 102 mmol/L (ref 98–111)
Creatinine, Ser: 1.12 mg/dL (ref 0.61–1.24)
GFR calc Af Amer: >60 mL/min (ref >60)
GFR calc non Af Amer: >60 mL/min (ref >60)
Glucose, Bld: 98 mg/dL (ref 70–99)
Potassium: 3.8 mmol/L (ref 3.5–5.1)
Sodium: 135 mmol/L (ref 135–145)
Total Bilirubin: 0.9 mg/dL (ref 0.3–1.2)
Total Protein: 8.3 g/dL — ABNORMAL HIGH (ref 6.5–8.1)

## 2019-08-29 MED ORDER — IOHEXOL 350 MG/ML SOLN
100.0000 mL | Freq: Once | INTRAVENOUS | Status: AC
  Administered 2019-08-29: 100 mL via INTRAVENOUS

## 2019-08-29 NOTE — ED Provider Notes (Signed)
[facility] EMERGENCY DEPARTMENT Provider Note   CSN: [id number] Arrival date & time: 08/29/19  1620     History Chief Complaint  Patient presents with  . Hemoptysis    Melvin Foley is a 74 y.o. male.  HPI      2-3 days ago developed hemoptysis Describes coughing up yellow colored mucus with pencil point areas of blood, small flecks Is on plavix so concerned about worsening bleeding Occurred twice today, whenever spit something up Reports has allergies, chronic cough PET scan yesterday at [facility] and concerned that lung cancer had recurred Lung doctor is at the [place] On plavix because of carotid artery disease and hx of TIA No shortness of breath, no chest pain No fevers No sick contacts  Past Medical History:  Diagnosis Date  . Anxiety    takes medication  . Arthritis   . Bipolar 1 disorder ([place])   . Bronchitis   . Carotid stenosis, left   . GERD (gastroesophageal reflux disease)   . H/O hiatal hernia   . Hypertension   . Shortness of breath   . Sleep apnea    2 YEARS  [place]   . Stroke [facility])     Patient Active Problem List   Diagnosis Date Noted  . Carotid stenosis 05/03/2017  . Difficulty with speech   . TIA (transient ischemic attack) 04/30/2017  . Hyperlipidemia 04/30/2017  . Proximal humerus fracture 03/02/2011    Past Surgical History:  Procedure Laterality Date  . CHOLECYSTECTOMY    . ENDARTERECTOMY Left 05/03/2017   Procedure: ENDARTERECTOMY CAROTID LEFT;  Surgeon: Rosetta Posner, MD;  Location: [facility];  Service: Vascular;  Laterality: Left;  . FRACTURE SURGERY     right ankle  . head injury    . KNEE SURGERY     x2  . ORIF SHOULDER FRACTURE  03/03/2011   Procedure: OPEN REDUCTION INTERNAL FIXATION (ORIF) SHOULDER FRACTURE;  Surgeon: Augustin Schooling;  Location: [place];  Service: Orthopedics;  Laterality: Left;  LEFT PROXIMAL HUMERUS Open Reduction internal fixation  . SHOULDER ARTHROSCOPY WITH SUBACROMIAL DECOMPRESSION AND  BICEP TENDON REPAIR  02/24/2012   Procedure: SHOULDER ARTHROSCOPY WITH SUBACROMIAL DECOMPRESSION AND BICEP TENDON REPAIR;  Surgeon: Augustin Schooling, MD;  Location: [facility];  Service: Orthopedics;  Laterality: Left;  with capsular release and lysis of adhesions  . TONSILLECTOMY         Family History  Problem Relation Age of Onset  . Congestive Heart Failure Mother   . Stroke Mother   . Congestive Heart Failure Father     Social History   Tobacco Use  . Smoking status: Former Smoker    Packs/day: 1.50  . Smokeless tobacco: Never Used  . Tobacco comment: stopped smoking 20 years ago  Substance Use Topics  . Alcohol use: No    Comment: past 20 years ago  . Drug use: Not Currently    Types: Marijuana, LSD    Home Medications Prior to Admission medications   Medication Sig Start Date End Date Taking? Authorizing Provider  famotidine (PEPCID) 40 MG tablet TAKE ONE TABLET BY MOUTH AT BEDTIME 06/30/19  Yes [provider]  fluticasone (FLONASE) 50 MCG/ACT nasal spray INSTILL 2 SPRAYS IN EACH NOSTRIL IN THE MORNING - USE EVERYDAY FOR NEXT 6 WEEKS. 01/08/19  Yes [provider]  guaiFENesin (MUCINEX) 600 MG 12 hr tablet TAKE ONE TABLET BY MOUTH TWICE A DAY FOR COUGH AND MUCUS 08/06/19  Yes [provider]  sildenafil (VIAGRA) 100 MG tablet TAKE ONE TABLET BY MOUTH AS INSTRUCTED (TAKE 1 HOUR PRIOR TO SEXUAL ACTIVITY *DO NOT EXCEED 1 DOSE PER 24 HOUR PERIOD*) 04/04/19  Yes [provider]  aspirin 81 MG tablet Take 81 mg by mouth daily.     [provider]  clopidogrel (PLAVIX) 75 MG tablet Take 1 tablet (75 mg total) by mouth at bedtime. 05/01/17   Geradine Girt, DO  gemfibrozil (LOPID) 600 MG tablet Take 600 mg by mouth 2 (two) times daily before a meal.    [provider]  omeprazole (PRILOSEC) 20 MG capsule Take 20 mg by mouth daily.    [provider]  pravastatin (PRAVACHOL) 20 MG tablet Take 20 mg by mouth daily.    [provider]  thiothixene (NAVANE) 5 MG capsule Take 5 mg by mouth 3 (three) times daily.      [provider]    Allergies    Patient has no known allergies.  Review of Systems   Review of Systems  Constitutional: Negative for fever.  HENT: Negative for sore throat.   Eyes: Negative for visual disturbance.  Respiratory: Positive for cough. Negative for shortness of breath.   Cardiovascular: Negative for chest pain.  Gastrointestinal: Negative for abdominal pain, nausea and vomiting.  Genitourinary: Negative for difficulty urinating.  Musculoskeletal: Negative for back pain and neck stiffness.  Skin: Negative for rash.  Neurological: Negative for syncope and headaches.    Physical Exam Updated Vital Signs BP (!) 119/91 (BP Location: Right Arm)   Pulse 69   Temp 98.2 F (36.8 C) (Oral)   Resp 13   Ht 5\' 8"  (1.727 m)   Wt 74.3 kg   SpO2 96%   BMI 24.92 kg/m   Physical Exam Vitals and nursing note reviewed.  Constitutional:      General: He is not in acute distress.    Appearance: He is well-developed. He is not diaphoretic.  HENT:     Head: Normocephalic and atraumatic.  Eyes:     Conjunctiva/sclera: Conjunctivae normal.  Cardiovascular:     Rate and Rhythm: Normal rate and regular rhythm.     Heart sounds: Normal heart sounds. No murmur. No friction rub. No gallop.   Pulmonary:     Effort: Pulmonary effort is normal. No respiratory distress.     Breath sounds: Normal breath sounds. No wheezing or rales.  Abdominal:     General: There is no distension.     Palpations: Abdomen is soft.     Tenderness: There is no abdominal tenderness. There is no guarding.  Musculoskeletal:     Cervical back: Normal range of motion.  Skin:    General: Skin is warm and dry.  Neurological:     Mental Status: He is alert and oriented to person, place, and time.     ED Results / Procedures / Treatments   Labs (all labs ordered are listed, but only abnormal results  are displayed) Labs Reviewed  CBC WITH DIFFERENTIAL/PLATELET - Abnormal; Notable for the following components:      Result Value   Eosinophils Absolute 0.9 (*)    All other components within normal limits  COMPREHENSIVE METABOLIC PANEL - Abnormal; Notable for the following components:   Total Protein 8.3 (*)    All other components within normal limits    EKG EKG Interpretation  Date/Time:  Thursday Aug 29 2019 16:55:54 EDT Ventricular Rate:  66 PR Interval:    QRS Duration:  86 QT Interval:  386 QTC Calculation: 405 R Axis:   -44 Text Interpretation: Sinus rhythm Left axis deviation Abnormal R-wave progression, early transition No significant change since last tracing Confirmed by Gareth Morgan [phone]) on 08/29/2019 6:40:36 PM   Radiology CT Angio Chest PE W and/or Wo Contrast  Result Date: 08/29/2019 CLINICAL DATA:  Hemoptysis, several months of coughing EXAM: CT ANGIOGRAPHY CHEST WITH CONTRAST TECHNIQUE: Multidetector CT imaging of the chest was performed using the standard protocol during bolus administration of intravenous contrast. Multiplanar CT image reconstructions and MIPs were obtained to evaluate the vascular anatomy. CONTRAST:  164mL OMNIPAQUE IOHEXOL 350 MG/ML SOLN COMPARISON:  Radiograph 04/30/2017, PET-CT 08/28/2019 (outside imaging unavailable, report in patient's EMR) FINDINGS: Cardiovascular: Satisfactory opacification the pulmonary arteries to the segmental level. No pulmonary artery filling defects are identified. Abrupt narrowing of the segmental and subsegmental branches as they enter the region of masslike opacity in the periphery of the right lower lobe. Central pulmonary arteries are normal caliber. Normal heart size. No pericardial effusion. Atherosclerotic plaque within the normal caliber aorta. The left vertebral artery arises directly from the aortic arch. Minimal plaque in the proximal great vessels. Mediastinum/Nodes: Scattered low-attenuation subcentimeter  mediastinal and hilar nodes are present. A larger 10 mm right hilar node is seen (10/136). 12 mm right paratracheal node is present as well (10/117). No acute abnormality of the trachea or esophagus. Thyroid gland and thoracic inlet are unremarkable. Lungs/Pleura: Pleural thickening about the right lower lobe and extending into the major fissure. Adjacent this pleural disease is a geographic 7.7 x 7.2 cm region volume loss and consolidation likely corresponding corresponding to a reported region of post radiation related change and reportedly demonstrating increased FDG avidity on outside PET-CT concerning for recurrent malignancy. Additional 1 cm solid nodule seen in the more medial right lung base (5/59). Additional thick-walled subpleural reticulation seen in the periphery of the right upper lobe (5/47) measuring 3.6 x 1.3 cm, also reportedly demonstrating FDG avidity on outside PET-CT. Concerning for additional site of malignancy. Additional more diffuse subpleural reticulations likely reflecting chronic fibrotic changes throughout the lungs. No pneumothorax. Suspect at least some trace right pleural fluid. No left effusion. Upper Abdomen: No acute abnormalities present in the visualized portions of the upper abdomen. Patient is post cholecystectomy. Musculoskeletal: The osseous structures appear diffusely demineralized which may limit detection of small or nondisplaced fractures. Multilevel degenerative changes are present in the imaged portions of the spine. Additional degenerative changes in the shoulders bilaterally with prior left humeral ORIF and more severe degenerative changes noted about the left glenohumeral joint. No acute osseous abnormality or suspicious osseous lesion. Review of the MIP images confirms the above findings. IMPRESSION: 1. No acute pulmonary artery filling defects are identified. 2. Truncation of the pulmonary arteries and peripheral airways entering into a region of masslike  consolidation and volume loss in the right lower lobe adjacent the thickened pleura and fissure. This region is reported to be FDG avid on outside exams highly concerning for recurrent malignancy in the geographic region of post radiation fibrosis. These obstructed airways may provide a nidus for hemoptysis. 3. Additional thick-walled subpleural reticulation in the periphery of the right upper lobe measuring 3.6 x 1.3 cm, also reportedly demonstrating FDG avidity on outside PET-CT and concerning for additional site of malignancy. 4. Additional 1 cm solid nodule in the more medial right lung base and 1 cm solid nodule in the more medial right lung base, similar to outside comparison dimensions. 5. Right hilar  and mediastinal adenopathy, with increased uptake on outside PET-CT concerning for metastatic disease. 6. Diffuse osseous demineralization some of which may be radiation. 7. Aortic Atherosclerosis (ICD10-I70.0). Electronically Signed   By: Lovena Le M.D.   On: 08/29/2019 18:54    Procedures Procedures (including critical care time)  Medications Ordered in ED Medications  iohexol (OMNIPAQUE) 350 MG/ML injection 100 mL (100 mLs Intravenous Contrast Given 08/29/19 1808)    ED Course  I have reviewed the triage vital signs and the nursing notes.  Pertinent labs & imaging results that were available during my care of the patient were reviewed by me and considered in my medical decision making (see chart for details).    MDM Rules/Calculators/A&P                      74yo male with history of bipolar, carotid stenosis, hypertension, TIA on plavix, PET scan yesterday showing concern for recurrence of lung cancer presents with concern for hemoptysis.  CBC shows normal hgb.    CT PE study done showing no pulmonary embolus, does show area concerning for recurrent malignancy in area of post radiation fibrosis and other areas concerning for malignancy.  This is likely etiology of hemoptysis.   Discussed given days of symptoms with pinpoint areas of hemoptysis described with him coughing up primarily mucus and no clots I feel the benefit of plavix outweighs the risk at this time and recommend continued follow up with his Oncologist and Pulmonologist.  Patient discharged in stable condition with understanding of reasons to return.     Final Clinical Impression(s) / ED Diagnoses Final diagnoses:  Hemoptysis  Cancer of lower lobe of right lung [facility])    Rx / DC Orders ED Discharge Orders    None       Gareth Morgan, MD 08/30/19 2124

## 2019-08-29 NOTE — ED Triage Notes (Addendum)
Cough for months. Now he is coughing blood. States he had a PET scan yesterday that showed his "cancer" is back. States hx of lung cancer.

## 2019-12-14 ENCOUNTER — Inpatient Hospital Stay (HOSPITAL_BASED_OUTPATIENT_CLINIC_OR_DEPARTMENT_OTHER)
Admission: EM | Admit: 2019-12-14 | Discharge: 2019-12-17 | DRG: 809 | Disposition: A | Discharge status: Home / Self Care | Payer: No Typology Code available for payment source | Attending: Family Medicine | Admitting: Family Medicine

## 2019-12-14 ENCOUNTER — Other Ambulatory Visit: Payer: Self-pay

## 2019-12-14 ENCOUNTER — Emergency Department (HOSPITAL_BASED_OUTPATIENT_CLINIC_OR_DEPARTMENT_OTHER): Payer: No Typology Code available for payment source

## 2019-12-14 ENCOUNTER — Encounter (HOSPITAL_BASED_OUTPATIENT_CLINIC_OR_DEPARTMENT_OTHER): Payer: Self-pay | Admitting: Emergency Medicine

## 2019-12-14 DIAGNOSIS — H532 Diplopia: Secondary | ICD-10-CM | POA: Diagnosis present

## 2019-12-14 DIAGNOSIS — D709 Neutropenia, unspecified: Secondary | ICD-10-CM | POA: Diagnosis not present

## 2019-12-14 DIAGNOSIS — J449 Chronic obstructive pulmonary disease, unspecified: Secondary | ICD-10-CM | POA: Diagnosis present

## 2019-12-14 DIAGNOSIS — Z7902 Long term (current) use of antithrombotics/antiplatelets: Secondary | ICD-10-CM

## 2019-12-14 DIAGNOSIS — Z79899 Other long term (current) drug therapy: Secondary | ICD-10-CM

## 2019-12-14 DIAGNOSIS — L270 Generalized skin eruption due to drugs and medicaments taken internally: Secondary | ICD-10-CM | POA: Diagnosis not present

## 2019-12-14 DIAGNOSIS — I4729 Other ventricular tachycardia: Secondary | ICD-10-CM

## 2019-12-14 DIAGNOSIS — R509 Fever, unspecified: Secondary | ICD-10-CM

## 2019-12-14 DIAGNOSIS — K2100 Gastro-esophageal reflux disease with esophagitis, without bleeding: Secondary | ICD-10-CM | POA: Diagnosis not present

## 2019-12-14 DIAGNOSIS — Z66 Do not resuscitate: Secondary | ICD-10-CM | POA: Diagnosis present

## 2019-12-14 DIAGNOSIS — I10 Essential (primary) hypertension: Secondary | ICD-10-CM | POA: Diagnosis present

## 2019-12-14 DIAGNOSIS — Y842 Radiological procedure and radiotherapy as the cause of abnormal reaction of the patient, or of later complication, without mention of misadventure at the time of the procedure: Secondary | ICD-10-CM | POA: Diagnosis not present

## 2019-12-14 DIAGNOSIS — Z8673 Personal history of transient ischemic attack (TIA), and cerebral infarction without residual deficits: Secondary | ICD-10-CM | POA: Diagnosis not present

## 2019-12-14 DIAGNOSIS — C3431 Malignant neoplasm of lower lobe, right bronchus or lung: Secondary | ICD-10-CM | POA: Diagnosis not present

## 2019-12-14 DIAGNOSIS — Z87891 Personal history of nicotine dependence: Secondary | ICD-10-CM | POA: Diagnosis not present

## 2019-12-14 DIAGNOSIS — F209 Schizophrenia, unspecified: Secondary | ICD-10-CM | POA: Diagnosis present

## 2019-12-14 DIAGNOSIS — I472 Ventricular tachycardia: Secondary | ICD-10-CM | POA: Diagnosis not present

## 2019-12-14 DIAGNOSIS — T451X5A Adverse effect of antineoplastic and immunosuppressive drugs, initial encounter: Secondary | ICD-10-CM | POA: Diagnosis present

## 2019-12-14 DIAGNOSIS — G4733 Obstructive sleep apnea (adult) (pediatric): Secondary | ICD-10-CM | POA: Diagnosis present

## 2019-12-14 DIAGNOSIS — I493 Ventricular premature depolarization: Secondary | ICD-10-CM | POA: Diagnosis not present

## 2019-12-14 DIAGNOSIS — D701 Agranulocytosis secondary to cancer chemotherapy: Secondary | ICD-10-CM | POA: Diagnosis not present

## 2019-12-14 DIAGNOSIS — Z20822 Contact with and (suspected) exposure to covid-19: Secondary | ICD-10-CM | POA: Diagnosis not present

## 2019-12-14 DIAGNOSIS — Z7982 Long term (current) use of aspirin: Secondary | ICD-10-CM | POA: Diagnosis not present

## 2019-12-14 DIAGNOSIS — Z9221 Personal history of antineoplastic chemotherapy: Secondary | ICD-10-CM

## 2019-12-14 DIAGNOSIS — R5081 Fever presenting with conditions classified elsewhere: Secondary | ICD-10-CM | POA: Diagnosis present

## 2019-12-14 HISTORY — DX: Malignant (primary) neoplasm, unspecified: C80.1

## 2019-12-14 LAB — URINALYSIS, ROUTINE W REFLEX MICROSCOPIC
Bilirubin Urine: NEGATIVE
Glucose, UA: NEGATIVE mg/dL
Hgb urine dipstick: NEGATIVE
Ketones, ur: NEGATIVE mg/dL
Leukocytes,Ua: NEGATIVE
Nitrite: NEGATIVE
Protein, ur: NEGATIVE mg/dL
Specific Gravity, Urine: 1.02 (ref 1.005–1.030)
pH: 6 (ref 5.0–8.0)

## 2019-12-14 LAB — CBC WITH DIFFERENTIAL/PLATELET
Abs Immature Granulocytes: 0.01 10*3/uL (ref 0.00–0.07)
Basophils Absolute: 0 10*3/uL (ref 0.0–0.1)
Basophils Relative: 1 %
Eosinophils Absolute: 0 10*3/uL (ref 0.0–0.5)
Eosinophils Relative: 1 %
HCT: 29.6 % — ABNORMAL LOW (ref 39.0–52.0)
Hemoglobin: 10.1 g/dL — ABNORMAL LOW (ref 13.0–17.0)
Immature Granulocytes: 1 %
Lymphocytes Relative: 8 %
Lymphs Abs: 0.1 10*3/uL — ABNORMAL LOW (ref 0.7–4.0)
MCH: 30.4 pg (ref 26.0–34.0)
MCHC: 34.1 g/dL (ref 30.0–36.0)
MCV: 89.2 fL (ref 80.0–100.0)
Monocytes Absolute: 0.1 10*3/uL (ref 0.1–1.0)
Monocytes Relative: 8 %
Neutro Abs: 1 10*3/uL — ABNORMAL LOW (ref 1.7–7.7)
Neutrophils Relative %: 81 %
Platelets: 171 10*3/uL (ref 150–400)
RBC: 3.32 MIL/uL — ABNORMAL LOW (ref 4.22–5.81)
RDW: 14.1 % (ref 11.5–15.5)
WBC: 1.2 10*3/uL — CL (ref 4.0–10.5)
nRBC: 0 % (ref 0.0–0.2)

## 2019-12-14 LAB — COMPREHENSIVE METABOLIC PANEL
ALT: 13 U/L (ref 0–44)
AST: 16 U/L (ref 15–41)
Albumin: 3.4 g/dL — ABNORMAL LOW (ref 3.5–5.0)
Alkaline Phosphatase: 48 U/L (ref 38–126)
Anion gap: 10 (ref 5–15)
BUN: 17 mg/dL (ref 8–23)
CO2: 25 mmol/L (ref 22–32)
Calcium: 8.5 mg/dL — ABNORMAL LOW (ref 8.9–10.3)
Chloride: 105 mmol/L (ref 98–111)
Creatinine, Ser: 0.93 mg/dL (ref 0.61–1.24)
GFR calc Af Amer: >60 mL/min (ref >60)
GFR calc non Af Amer: >60 mL/min (ref >60)
Glucose, Bld: 117 mg/dL — ABNORMAL HIGH (ref 70–99)
Potassium: 3.7 mmol/L (ref 3.5–5.1)
Sodium: 140 mmol/L (ref 135–145)
Total Bilirubin: 1.2 mg/dL (ref 0.3–1.2)
Total Protein: 6.6 g/dL (ref 6.5–8.1)

## 2019-12-14 LAB — SARS CORONAVIRUS 2 BY RT PCR (HOSPITAL ORDER, PERFORMED IN ~~LOC~~ HOSPITAL LAB): SARS Coronavirus 2: NEGATIVE

## 2019-12-14 LAB — LACTIC ACID, PLASMA: Lactic Acid, Venous: 1 mmol/L (ref 0.5–1.9)

## 2019-12-14 LAB — TROPONIN I (HIGH SENSITIVITY)
Troponin I (High Sensitivity): 5 ng/L (ref <18)
Troponin I (High Sensitivity): 5 ng/L (ref <18)

## 2019-12-14 LAB — MAGNESIUM: Magnesium: 1.5 mg/dL — ABNORMAL LOW (ref 1.7–2.4)

## 2019-12-14 MED ORDER — THIOTHIXENE 2 MG PO CAPS
2.0000 mg | ORAL_CAPSULE | Freq: Three times a day (TID) | ORAL | Status: DC
  Administered 2019-12-14 – 2019-12-17 (×8): 2 mg via ORAL
  Filled 2019-12-14 (×10): qty 1

## 2019-12-14 MED ORDER — SODIUM CHLORIDE 0.9 % IV SOLN: Freq: Once | INTRAVENOUS | Status: AC

## 2019-12-14 MED ORDER — FLUTICASONE PROPIONATE 50 MCG/ACT NA SUSP
1.0000 | Freq: Every day | NASAL | Status: DC
  Administered 2019-12-15 – 2019-12-17 (×3): 1 via NASAL
  Filled 2019-12-14: qty 16

## 2019-12-14 MED ORDER — MAGNESIUM SULFATE 2 GM/50ML IV SOLN
2.0000 g | Freq: Once | INTRAVENOUS | Status: AC
  Administered 2019-12-14: 2 g via INTRAVENOUS
  Filled 2019-12-14: qty 50

## 2019-12-14 MED ORDER — ASPIRIN EC 81 MG PO TBEC
81.0000 mg | DELAYED_RELEASE_TABLET | Freq: Every day | ORAL | Status: DC
  Filled 2019-12-14 (×2): qty 1

## 2019-12-14 MED ORDER — PANTOPRAZOLE SODIUM 40 MG PO TBEC
40.0000 mg | DELAYED_RELEASE_TABLET | Freq: Every day | ORAL | Status: DC
  Administered 2019-12-15 – 2019-12-17 (×3): 40 mg via ORAL
  Filled 2019-12-14 (×3): qty 1

## 2019-12-14 MED ORDER — SODIUM CHLORIDE 0.9 % IV SOLN: 2.0000 g | Freq: Once | INTRAVENOUS | Status: DC

## 2019-12-14 MED ORDER — SENNOSIDES-DOCUSATE SODIUM 8.6-50 MG PO TABS: 1.0000 | ORAL_TABLET | Freq: Every evening | ORAL | Status: DC | PRN

## 2019-12-14 MED ORDER — CLOPIDOGREL BISULFATE 75 MG PO TABS
75.0000 mg | ORAL_TABLET | Freq: Every day | ORAL | Status: DC
  Administered 2019-12-14 – 2019-12-16 (×3): 75 mg via ORAL
  Filled 2019-12-14 (×3): qty 1

## 2019-12-14 MED ORDER — VANCOMYCIN HCL 750 MG/150ML IV SOLN
750.0000 mg | Freq: Two times a day (BID) | INTRAVENOUS | Status: DC
  Administered 2019-12-14 – 2019-12-16 (×4): 750 mg via INTRAVENOUS
  Filled 2019-12-14 (×4): qty 150

## 2019-12-14 MED ORDER — ENOXAPARIN SODIUM 40 MG/0.4ML ~~LOC~~ SOLN
40.0000 mg | SUBCUTANEOUS | Status: DC
  Administered 2019-12-14 – 2019-12-16 (×3): 40 mg via SUBCUTANEOUS
  Filled 2019-12-14 (×3): qty 0.4

## 2019-12-14 MED ORDER — VANCOMYCIN HCL IN DEXTROSE 1-5 GM/200ML-% IV SOLN
1000.0000 mg | Freq: Once | INTRAVENOUS | Status: AC
  Administered 2019-12-14: 1000 mg via INTRAVENOUS
  Filled 2019-12-14: qty 200

## 2019-12-14 MED ORDER — SODIUM CHLORIDE 0.9 % IV BOLUS
1000.0000 mL | Freq: Once | INTRAVENOUS | Status: AC
  Administered 2019-12-14: 11:00:00 1000 mL via INTRAVENOUS

## 2019-12-14 MED ORDER — METRONIDAZOLE IN NACL 5-0.79 MG/ML-% IV SOLN
500.0000 mg | Freq: Three times a day (TID) | INTRAVENOUS | Status: DC
  Administered 2019-12-14 – 2019-12-16 (×5): 500 mg via INTRAVENOUS
  Filled 2019-12-14 (×6): qty 100

## 2019-12-14 MED ORDER — SODIUM CHLORIDE 0.9 % IV SOLN
INTRAVENOUS | Status: DC | PRN
  Administered 2019-12-14: 500 mL via INTRAVENOUS

## 2019-12-14 MED ORDER — LACTATED RINGERS IV BOLUS (SEPSIS)
250.0000 mL | Freq: Once | INTRAVENOUS | Status: AC
  Administered 2019-12-14: 250 mL via INTRAVENOUS

## 2019-12-14 MED ORDER — SODIUM CHLORIDE 0.9 % IV SOLN
2.0000 g | Freq: Three times a day (TID) | INTRAVENOUS | Status: DC
  Administered 2019-12-14 – 2019-12-16 (×5): 2 g via INTRAVENOUS
  Filled 2019-12-14 (×6): qty 2

## 2019-12-14 MED ORDER — SODIUM CHLORIDE 0.9 % IV SOLN
2.0000 g | Freq: Once | INTRAVENOUS | Status: AC
  Administered 2019-12-14: 11:00:00 2 g via INTRAVENOUS
  Filled 2019-12-14: qty 2

## 2019-12-14 MED ORDER — ACETAMINOPHEN 325 MG PO TABS
650.0000 mg | ORAL_TABLET | Freq: Four times a day (QID) | ORAL | Status: DC | PRN
  Administered 2019-12-14 – 2019-12-15 (×2): 650 mg via ORAL
  Filled 2019-12-14 (×2): qty 2

## 2019-12-14 MED ORDER — FAMOTIDINE 20 MG PO TABS
40.0000 mg | ORAL_TABLET | Freq: Every day | ORAL | Status: DC
  Administered 2019-12-14 – 2019-12-16 (×3): 40 mg via ORAL
  Filled 2019-12-14 (×3): qty 2

## 2019-12-14 MED ORDER — HYDROCORTISONE 1 % EX CREA
TOPICAL_CREAM | Freq: Two times a day (BID) | CUTANEOUS | Status: DC
  Administered 2019-12-16: 1 via TOPICAL
  Filled 2019-12-14: qty 28

## 2019-12-14 MED ORDER — LACTATED RINGERS IV SOLN: INTRAVENOUS | Status: DC

## 2019-12-14 NOTE — Progress Notes (Signed)
Patient is 74 year old male with history of adenocarcinoma of the right lung currently on chemo and radiation therapy [facility]  for his cancer treatment who presented to the [place] today with complaints of fever, chills at home.  He was complaining of generalized weakness.  He also reported some cough but this is not a new problem for him.  Denies any abdomen pain, nausea, vomiting. On presentation he was hemodynamically stable but was found to have mild grade fever.  He just had the chemotherapy about a week ago.  Lab work showed leukopenia with ANC of around 1000.  Magnesium of 1.5.  Patient accepted for admission to [facility] for observation for neutropenic fever.  He has been started on cefepime.  Blood cultures have been sent.
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
## 2019-12-14 NOTE — Progress Notes (Signed)
Pharmacy Antibiotic Note  Melvin Foley is a 74 y.o. male admitted on 12/14/2019 with Febrile Neutropenia .  Pharmacy has been consulted for Cefepime dosing.   Height: 5\' 8"  (172.7 cm) Weight: 69.4 kg (153 lb) IBW/kg (Calculated) : 68.4  Temp (24hrs), Avg:99.4 F (37.4 C), Min:99.3 F (37.4 C), Max:99.5 F (37.5 C)  Recent Labs  Lab 12/14/19 1117  WBC 1.2*  CREATININE 0.93  LATICACIDVEN 1.0    Estimated Creatinine Clearance: 67.4 mL/min (by C-G formula based on SCr of 0.93 mg/dL).    No Known Allergies  Antimicrobials this admission: 8/28 Cefepime >>   Dose adjustments this admission: N/a  Microbiology results: Pending   Plan:  - Cefepime 2g IV q8h - Monitor patients renal function and urine output  - De-escalate ABX when appropriate   Thank you for allowing pharmacy to be a part of this patient's care.  Duanne Limerick PharmD. BCPS 12/14/2019 2:43 PM

## 2019-12-14 NOTE — ED Notes (Signed)
ED MD informed of WBC 1.2, and Risk analyst

## 2019-12-14 NOTE — Progress Notes (Addendum)
Pharmacy Antibiotic Note  Melvin Foley is a 74 y.o. male admitted on 12/14/2019 with Febrile neutropenia. Pharmacy previously consulted for cefepime dosing; now consulted to add vancomycin (MD adding Flagyl)  Plan:  Continue cefepime 2g IV q8 hr as ordered  Vancomycin 1000 mg IV now, then 750 mg IV q12 hr (est AUC 499 based on SCr 0.93; Vd 0.72)  Measure vancomycin AUC at steady state as indicated  SCr q48 while on vanc  Flagyl per MD   Height: 5\' 8"  (172.7 cm) Weight: 69.4 kg (153 lb) IBW/kg (Calculated) : 68.4  Temp (24hrs), Avg:99.1 F (37.3 C), Min:98.6 F (37 C), Max:99.5 F (37.5 C)  Recent Labs  Lab 12/14/19 1117  WBC 1.2*  CREATININE 0.93  LATICACIDVEN 1.0    Estimated Creatinine Clearance: 67.4 mL/min (by C-G formula based on SCr of 0.93 mg/dL).    No Known Allergies  Antimicrobials this admission: 8/28 vanc >>  8/28 cefepime >>  8/28 Flagyl >>   Dose adjustments this admission: n/a  Microbiology results: 8/28 BCx: ngtd   Thank you for allowing pharmacy to be a part of this patient's care.  Samentha Perham A 12/14/2019 9:48 PM

## 2019-12-14 NOTE — ED Notes (Signed)
ED Provider at bedside. 

## 2019-12-14 NOTE — ED Notes (Signed)
Tried calling report. RN Lesly Rubenstein at lunch per Network engineer and will return call for report.

## 2019-12-14 NOTE — ED Notes (Signed)
Portable Xray at bedside.

## 2019-12-14 NOTE — ED Notes (Signed)
Pt aware urine specimen ordered. Pt reports inability to provide specimen at this time. Specimen collection device provided to patient. 
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
## 2019-12-14 NOTE — ED Notes (Signed)
Pt attempting to provide urine specimen

## 2019-12-14 NOTE — ED Triage Notes (Addendum)
Fever and cough today. Recently finished cancer treatments for lung CA. Also endorses feeling more SOB than normal

## 2019-12-15 DIAGNOSIS — Z7982 Long term (current) use of aspirin: Secondary | ICD-10-CM | POA: Diagnosis not present

## 2019-12-15 DIAGNOSIS — I472 Ventricular tachycardia: Secondary | ICD-10-CM | POA: Diagnosis present

## 2019-12-15 DIAGNOSIS — Z20822 Contact with and (suspected) exposure to covid-19: Secondary | ICD-10-CM | POA: Diagnosis present

## 2019-12-15 DIAGNOSIS — L270 Generalized skin eruption due to drugs and medicaments taken internally: Secondary | ICD-10-CM | POA: Diagnosis present

## 2019-12-15 DIAGNOSIS — Z7902 Long term (current) use of antithrombotics/antiplatelets: Secondary | ICD-10-CM | POA: Diagnosis not present

## 2019-12-15 DIAGNOSIS — H532 Diplopia: Secondary | ICD-10-CM | POA: Diagnosis present

## 2019-12-15 DIAGNOSIS — Z87891 Personal history of nicotine dependence: Secondary | ICD-10-CM | POA: Diagnosis not present

## 2019-12-15 DIAGNOSIS — J449 Chronic obstructive pulmonary disease, unspecified: Secondary | ICD-10-CM | POA: Diagnosis present

## 2019-12-15 DIAGNOSIS — D709 Neutropenia, unspecified: Secondary | ICD-10-CM | POA: Diagnosis present

## 2019-12-15 DIAGNOSIS — C3431 Malignant neoplasm of lower lobe, right bronchus or lung: Secondary | ICD-10-CM | POA: Diagnosis present

## 2019-12-15 DIAGNOSIS — Z8673 Personal history of transient ischemic attack (TIA), and cerebral infarction without residual deficits: Secondary | ICD-10-CM | POA: Diagnosis not present

## 2019-12-15 DIAGNOSIS — Z79899 Other long term (current) drug therapy: Secondary | ICD-10-CM | POA: Diagnosis not present

## 2019-12-15 DIAGNOSIS — D701 Agranulocytosis secondary to cancer chemotherapy: Secondary | ICD-10-CM | POA: Diagnosis present

## 2019-12-15 DIAGNOSIS — R5081 Fever presenting with conditions classified elsewhere: Secondary | ICD-10-CM | POA: Diagnosis present

## 2019-12-15 DIAGNOSIS — R509 Fever, unspecified: Secondary | ICD-10-CM | POA: Diagnosis present

## 2019-12-15 DIAGNOSIS — G4733 Obstructive sleep apnea (adult) (pediatric): Secondary | ICD-10-CM | POA: Diagnosis present

## 2019-12-15 DIAGNOSIS — Z66 Do not resuscitate: Secondary | ICD-10-CM | POA: Diagnosis present

## 2019-12-15 DIAGNOSIS — K2100 Gastro-esophageal reflux disease with esophagitis, without bleeding: Secondary | ICD-10-CM | POA: Diagnosis present

## 2019-12-15 DIAGNOSIS — T451X5A Adverse effect of antineoplastic and immunosuppressive drugs, initial encounter: Secondary | ICD-10-CM | POA: Diagnosis present

## 2019-12-15 DIAGNOSIS — Z9221 Personal history of antineoplastic chemotherapy: Secondary | ICD-10-CM | POA: Diagnosis not present

## 2019-12-15 DIAGNOSIS — I10 Essential (primary) hypertension: Secondary | ICD-10-CM | POA: Diagnosis present

## 2019-12-15 DIAGNOSIS — I493 Ventricular premature depolarization: Secondary | ICD-10-CM | POA: Diagnosis present

## 2019-12-15 DIAGNOSIS — F209 Schizophrenia, unspecified: Secondary | ICD-10-CM | POA: Diagnosis present

## 2019-12-15 DIAGNOSIS — Y842 Radiological procedure and radiotherapy as the cause of abnormal reaction of the patient, or of later complication, without mention of misadventure at the time of the procedure: Secondary | ICD-10-CM | POA: Diagnosis present

## 2019-12-15 LAB — CBC WITH DIFFERENTIAL/PLATELET
Abs Immature Granulocytes: 0.03 10*3/uL (ref 0.00–0.07)
Basophils Absolute: 0 10*3/uL (ref 0.0–0.1)
Basophils Relative: 1 %
Eosinophils Absolute: 0 10*3/uL (ref 0.0–0.5)
Eosinophils Relative: 2 %
HCT: 24.9 % — ABNORMAL LOW (ref 39.0–52.0)
Hemoglobin: 8.5 g/dL — ABNORMAL LOW (ref 13.0–17.0)
Immature Granulocytes: 3 %
Lymphocytes Relative: 10 %
Lymphs Abs: 0.1 10*3/uL — ABNORMAL LOW (ref 0.7–4.0)
MCH: 30.1 pg (ref 26.0–34.0)
MCHC: 34.1 g/dL (ref 30.0–36.0)
MCV: 88.3 fL (ref 80.0–100.0)
Monocytes Absolute: 0.1 10*3/uL (ref 0.1–1.0)
Monocytes Relative: 12 %
Neutro Abs: 0.8 10*3/uL — ABNORMAL LOW (ref 1.7–7.7)
Neutrophils Relative %: 72 %
Platelets: 162 10*3/uL (ref 150–400)
RBC: 2.82 MIL/uL — ABNORMAL LOW (ref 4.22–5.81)
RDW: 14 % (ref 11.5–15.5)
WBC: 1.1 10*3/uL — CL (ref 4.0–10.5)
nRBC: 0 % (ref 0.0–0.2)

## 2019-12-15 LAB — COMPREHENSIVE METABOLIC PANEL
ALT: 10 U/L (ref 0–44)
AST: 15 U/L (ref 15–41)
Albumin: 3 g/dL — ABNORMAL LOW (ref 3.5–5.0)
Alkaline Phosphatase: 40 U/L (ref 38–126)
Anion gap: 9 (ref 5–15)
BUN: 14 mg/dL (ref 8–23)
CO2: 22 mmol/L (ref 22–32)
Calcium: 8.1 mg/dL — ABNORMAL LOW (ref 8.9–10.3)
Chloride: 105 mmol/L (ref 98–111)
Creatinine, Ser: 0.81 mg/dL (ref 0.61–1.24)
GFR calc Af Amer: >60 mL/min (ref >60)
GFR calc non Af Amer: >60 mL/min (ref >60)
Glucose, Bld: 97 mg/dL (ref 70–99)
Potassium: 3.6 mmol/L (ref 3.5–5.1)
Sodium: 136 mmol/L (ref 135–145)
Total Bilirubin: 1 mg/dL (ref 0.3–1.2)
Total Protein: 5.8 g/dL — ABNORMAL LOW (ref 6.5–8.1)

## 2019-12-15 LAB — PROTIME-INR
INR: 1.1 (ref 0.8–1.2)
Prothrombin Time: 13.6 seconds (ref 11.4–15.2)

## 2019-12-15 LAB — CORTISOL-AM, BLOOD: Cortisol - AM: 16.9 ug/dL (ref 6.7–22.6)

## 2019-12-15 LAB — PROCALCITONIN: Procalcitonin: <0.1 ng/mL

## 2019-12-15 MED ORDER — TBO-FILGRASTIM 300 MCG/0.5ML ~~LOC~~ SOSY
300.0000 ug | PREFILLED_SYRINGE | Freq: Every day | SUBCUTANEOUS | Status: DC
  Administered 2019-12-15: 300 ug via SUBCUTANEOUS
  Filled 2019-12-15 (×2): qty 0.5

## 2019-12-15 NOTE — Progress Notes (Signed)
PROGRESS NOTE    [facility] [place]  VOJ:[id number] DOB: 1945-07-03 DOA: 12/14/2019 PCP: Wallie Char, FNP  Brief Narrative:  23 home dwelling male RLL adeno CA diagnosed 09/05/2018 Follows with Dr. Vashti Hey at the [facility] Dr. Lurline Hare at [facility] and previously gets his XRT at [facility] He also has a history of OSA schizophrenia stroke COPD and bipolar He quit smoking 1997 smoked for 24 years History of left carotid endarterectomy 2019 Prior stroke on Plavix Received sixth cycle of carboplatin paclitaxel last week-started feeling uncomfortable probably 8/23 2021 Went to [facility] on 11/2619 and had to get saline infusion because he said he was dehydrated Also had 1 episode or 2 episodes of diarrhea around that time  Had a fever on 8/27  100.4 and worsening cough  Went to [place] to have a brief run of possible polymorphic V. tach there he was given magnesium for this Was transferred to [facility] for admission and further management of neutropenic fever and episodic polymorphic V. tach   Assessment & Plan:   Active Problems:   Neutropenic fever ([place])   1. Neutropenic fever and overall neutropenia a. Secondary to carboplatin and paclitaxel most likely and timing coincides with the same 7 to 10 days out from the same b. Start Granix for 3 days c. Follow ANC/differential a.m. d. Continue broad-spectrum vancomycin cefepime and Flagyl and narrow depending on cultures obtained 2. Stage IIIb adenocarcinoma status post XRT on?  Carboplatin paclitaxel a. Patient gets majority of cancer care at the [place] in [facility] b. We will consult our oncologist here if there are any further issues c. We will need outpatient further management per them 3. 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 Did not eat well today b. I have offered him lidocaine viscous and he will think about it and let me know tomorrow 4. Torsades? a. 14 beats apparently on  admission at [facility] b. On telemetry today I only see occasional PVCs c. Keep on monitors today but discontinue a.m. if no further recurrence d. Check a.m. mag 5. OSA 6. Schizophrenia a. Counseled earlier in admission regarding his antipsychotic thiothixene which she has been taking since his teenage years b. Known side effects of torsades, granulocytopenia-have cut back the dose from 5 to 2 mg 3 times daily c. He will need follow-up with psychiatry as an outpatient regarding this 7. Carotid endarterectomy 2019+ prior strokes a. Continuing aspirin and Plavix 8. Prior smoker   DVT prophylaxis: SCDs at this time Code Status: DNR confirmed Family Communication: Discussed with wife at the bedside Disposition: Inpatient  Status is: Observation  The patient will require care spanning > 2 midnights and should be moved to inpatient because: Hemodynamically unstable  Dispo: The patient is from: Home              Anticipated d/c is to: Home              Anticipated d/c date is: 2 days              Patient currently is not medically stable to d/c.       Consultants:   None  Procedures: X-rays  Antimicrobials: Broad-spectrum   Subjective: Awake alert does not feel so good did not eat well wife reports he is having mainly soft foods No chest pain no chills No dark or tarry stool no bleeding from anywhere  Objective: Vitals:   12/14/19 2130  12/15/19 0217 12/15/19 0552 12/15/19 1413  BP: 98/64 121/82 97/67 120/78  Pulse: 79 91 84 85  Resp:  20 20 18   Temp: 98.6 F (37 C) (!) 101.1 F (38.4 C) 99.2 F (37.3 C) 99.2 F (37.3 C)  TempSrc: Oral Oral Oral Oral  SpO2: 96% 96% 98%   Weight:      Height:        Intake/Output Summary (Last 24 hours) at 12/15/2019 1432 Last data filed at 12/15/2019 1400 Gross per 24 hour  Intake 2230.83 ml  Output 1650 ml  Net 580.83 ml   Filed Weights   12/14/19 1046  Weight: 69.4 kg    Examination:  General exam:  Awake alert coherent kind of sleepy Respiratory system: Clear no added sound no rales Cardiovascular system: S1-S2-see above regarding monitors Gastrointestinal system: Abdomen soft no rebound no organomegaly. Central nervous system: Power intact sensory intact rest of exam deferred Extremities: No lower extremity edema Skin: Radiation burns to front of chest back of chest and area in right clavicular region again noted to be tender but nonpurulent Psychiatry: Euthymic no focal deficit  Data Reviewed: I have personally reviewed following labs and imaging studies WBC 1.1 ANC 800 Hemoglobin down from 10.1-8.5 platelet 162 Procalcitonin less than 0.10 BUN/creatinine 17/0.9-->14/0.8  Radiology Studies: DG Chest Portable 1 View  Result Date: 12/14/2019 CLINICAL DATA:  Shortness of breath.  Known lung cancer. EXAM: PORTABLE CHEST 1 VIEW COMPARISON:  CT chest dated 08/29/2019 FINDINGS: The heart size and mediastinal contours are within normal limits. Masslike consolidation in the right lung base and peripheral reticulation in the right mid lung likely represents the patient's known malignancy. There is mild left basilar reticulation. There is no left pleural effusion. There is no pneumothorax. The visualized skeletal structures are unremarkable. IMPRESSION: Masslike consolidation in the right lung base and peripheral reticulation in the right mid lung likely represents the patient's known malignancy. Mild left basilar reticulation is nonspecific and may represent atelectasis, infection, edema, or post treatment changes of the patient received radiation to this area. Electronically Signed   By: Zerita Boers M.D.   On: 12/14/2019 12:41     Scheduled Meds: . aspirin EC  81 mg Oral Daily  . clopidogrel  75 mg Oral QHS  . enoxaparin (LOVENOX) injection  40 mg Subcutaneous Q24H  . famotidine  40 mg Oral QHS  . fluticasone  1 spray Each Nare Daily  . hydrocortisone cream   Topical BID  . pantoprazole   40 mg Oral Daily  . Tbo-filgastrim (GRANIX) SQ  300 mcg Subcutaneous q1800  . thiothixene  2 mg Oral TID  Continue aspirin and Plavix Continuous Infusions: . sodium chloride 10 mL/hr at 12/14/19 1222  . ceFEPime (MAXIPIME) IV 2 g (12/15/19 1352)  . lactated ringers 75 mL/hr at 12/15/19 0837  . metronidazole 500 mg (12/15/19 1351)  . vancomycin 750 mg (12/15/19 0944)     LOS: 0 days    Time spent: [place], MD [facility] To contact the attending provider between 7A-7P or the covering provider during after hours 7P-7A, please log into the web site [email or web address] and access using universal [place] password for that web site. If you do not have the password, please call the hospital operator.  12/15/2019, 2:32 PM

## 2019-12-16 LAB — COMPREHENSIVE METABOLIC PANEL
ALT: 13 U/L (ref 0–44)
AST: 19 U/L (ref 15–41)
Albumin: 2.9 g/dL — ABNORMAL LOW (ref 3.5–5.0)
Alkaline Phosphatase: 38 U/L (ref 38–126)
Anion gap: 9 (ref 5–15)
BUN: 12 mg/dL (ref 8–23)
CO2: 22 mmol/L (ref 22–32)
Calcium: 7.9 mg/dL — ABNORMAL LOW (ref 8.9–10.3)
Chloride: 106 mmol/L (ref 98–111)
Creatinine, Ser: 0.88 mg/dL (ref 0.61–1.24)
GFR calc Af Amer: >60 mL/min (ref >60)
GFR calc non Af Amer: >60 mL/min (ref >60)
Glucose, Bld: 96 mg/dL (ref 70–99)
Potassium: 3.4 mmol/L — ABNORMAL LOW (ref 3.5–5.1)
Sodium: 137 mmol/L (ref 135–145)
Total Bilirubin: 1 mg/dL (ref 0.3–1.2)
Total Protein: 5.8 g/dL — ABNORMAL LOW (ref 6.5–8.1)

## 2019-12-16 LAB — CBC WITH DIFFERENTIAL/PLATELET
Abs Immature Granulocytes: 0.02 10*3/uL (ref 0.00–0.07)
Basophils Absolute: 0 10*3/uL (ref 0.0–0.1)
Basophils Relative: 0 %
Eosinophils Absolute: 0 10*3/uL (ref 0.0–0.5)
Eosinophils Relative: 1 %
HCT: 25.7 % — ABNORMAL LOW (ref 39.0–52.0)
Hemoglobin: 8.6 g/dL — ABNORMAL LOW (ref 13.0–17.0)
Immature Granulocytes: 1 %
Lymphocytes Relative: 7 %
Lymphs Abs: 0.2 10*3/uL — ABNORMAL LOW (ref 0.7–4.0)
MCH: 30 pg (ref 26.0–34.0)
MCHC: 33.5 g/dL (ref 30.0–36.0)
MCV: 89.5 fL (ref 80.0–100.0)
Monocytes Absolute: 0.3 10*3/uL (ref 0.1–1.0)
Monocytes Relative: 9 %
Neutro Abs: 2.6 10*3/uL (ref 1.7–7.7)
Neutrophils Relative %: 82 %
Platelets: 174 10*3/uL (ref 150–400)
RBC: 2.87 MIL/uL — ABNORMAL LOW (ref 4.22–5.81)
RDW: 14.4 % (ref 11.5–15.5)
WBC: 3.2 10*3/uL — ABNORMAL LOW (ref 4.0–10.5)
nRBC: 0 % (ref 0.0–0.2)

## 2019-12-16 LAB — MAGNESIUM: Magnesium: 1.5 mg/dL — ABNORMAL LOW (ref 1.7–2.4)

## 2019-12-16 MED ORDER — AMOXICILLIN-POT CLAVULANATE 875-125 MG PO TABS
1.0000 | ORAL_TABLET | Freq: Two times a day (BID) | ORAL | Status: DC
  Administered 2019-12-16 – 2019-12-17 (×3): 1 via ORAL
  Filled 2019-12-16 (×3): qty 1

## 2019-12-16 MED ORDER — LIDOCAINE VISCOUS HCL 2 % MT SOLN
15.0000 mL | Freq: Once | OROMUCOSAL | Status: AC
  Administered 2019-12-16: 15 mL via ORAL
  Filled 2019-12-16: qty 15

## 2019-12-16 MED ORDER — SUCRALFATE 1 G PO TABS
1.0000 g | ORAL_TABLET | Freq: Three times a day (TID) | ORAL | Status: DC
  Administered 2019-12-16 – 2019-12-17 (×4): 1 g via ORAL
  Filled 2019-12-16 (×4): qty 1

## 2019-12-16 MED ORDER — CALCIUM CARBONATE ANTACID 500 MG PO CHEW
1.0000 | CHEWABLE_TABLET | Freq: Two times a day (BID) | ORAL | Status: DC | PRN
  Administered 2019-12-16: 200 mg via ORAL
  Filled 2019-12-16 (×2): qty 1

## 2019-12-16 MED ORDER — ALUM & MAG HYDROXIDE-SIMETH 200-200-20 MG/5ML PO SUSP
30.0000 mL | Freq: Once | ORAL | Status: AC
  Administered 2019-12-16: 30 mL via ORAL
  Filled 2019-12-16: qty 30

## 2019-12-16 MED ORDER — LEVOFLOXACIN 500 MG PO TABS: 500.0000 mg | ORAL_TABLET | Freq: Every day | ORAL | Status: DC

## 2019-12-16 MED ORDER — MAGNESIUM SULFATE 2 GM/50ML IV SOLN
2.0000 g | Freq: Once | INTRAVENOUS | Status: AC
  Administered 2019-12-16: 2 g via INTRAVENOUS
  Filled 2019-12-16: qty 50

## 2019-12-16 NOTE — Progress Notes (Signed)
PROGRESS NOTE    Melvin Foley  WCH:[id number] DOB: 1945/06/04 DOA: 12/14/2019 PCP: Wallie Char, FNP  Brief Narrative:  67 home dwelling male RLL adeno CA diagnosed 09/05/2018 Follows with Dr. Vashti Hey at the [facility] Dr. Lurline Hare at [facility] and previously gets his XRT at [facility] He also has a history of OSA schizophrenia stroke COPD and bipolar He quit smoking 1997 smoked for 24 years History of left carotid endarterectomy 2019 Prior stroke on Plavix Received sixth cycle of carboplatin paclitaxel last week-started feeling uncomfortable probably 8/23 2021 Went to [facility] on 11/2619 and had to get saline infusion because he said he was dehydrated Also had 1 episode or 2 episodes of diarrhea around that time  Had a fever on 8/27  100.4 and worsening cough  Went to [place] to have a brief run of possible polymorphic V. tach there he was given magnesium for this Was transferred to [facility] for admission and further management of neutropenic fever and episodic polymorphic V. tach   Assessment & Plan:   Active Problems:   Neutropenic fever (HCC)   Neutropenia ([place])   1. 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 Not better and augmenting meds b. Cannot discharge if he is not able to keep down food and he seems uncomfortable today only taking in liquids 2. Adding GI cocktail in addition to Carafate todayNeutropenic fever and overall neutropenia a. Secondary to carboplatin and paclitaxel most likely and timing coincides with the same 7 to 10 days out from the same b. Continue Granix until 9/1 c. Follow ANC/differential a.m. d. Blood culture NGTD, urine culture never obtained-narrowed to Levaquin today and monitor temperature max curve 3. Stage IIIb adenocarcinoma status post XRT on?  Carboplatin paclitaxel a. Patient gets majority of cancer care at the [place] in [facility] b. We will consult our oncologist here if there are any  further issues c. Likely can follow-up at [facility] in the outpatient 4. Torsades? a. 14 beats apparently on admission at [facility] b. No recurrecne-d/c monitors 5. OSA 6. Schizophrenia a. Counseled earlier in admission regarding his antipsychotic thiothixene which she has been taking since his teenage years b. Known side effects of torsades, granulocytopenia-have cut back the dose from 5 to 2 mg 3 times daily c. He will need follow-up with psychiatry as an outpatient regarding this 7. Carotid endarterectomy 2019+ prior strokes a. Continuing aspirin and Plavix 8. Prior smoker   DVT prophylaxis: SCDs at this time Code Status: DNR confirmed Family Communication: Discussed with wife at the bedside Disposition: Inpatient  Status is: Inpatient  The patient will require care spanning > 2 midnights and should be moved to inpatient because: Hemodynamically unstable  Dispo: The patient is from: Home              Anticipated d/c is to: Home              Anticipated d/c date is: 2 days              Patient currently is not medically stable to d/c.       Consultants:   None  Procedures: X-rays  Antimicrobials: Broad-spectrum   Subjective:  Having intractable pain and unable to eat and this has been going on for 3 weeks to 4 weeks and it was thought by his [facility] oncologist that this was radiation esophagitis He is willing today to take GI cocktail and I  am adding Carafate He has had no discernible fever  Objective: Vitals:   12/15/19 1841 12/15/19 1850 12/15/19 1956 12/16/19 0445  BP:   105/69 98/87  Pulse:  91 91 91  Resp: (!) 21  (!) 22 20  Temp:   98.7 F (37.1 C) 99.5 F (37.5 C)  TempSrc:   Oral Oral  SpO2: 98% 96% 99% 98%  Weight:      Height:        Intake/Output Summary (Last 24 hours) at 12/16/2019 1114 Last data filed at 12/16/2019 0900 Gross per 24 hour  Intake 1448.7 ml  Output 2500 ml  Net -1051.3 ml   Filed Weights   12/14/19  1046  Weight: 69.4 kg    Examination:  Awake coherent pleasant no distress EOMI NCAT no focal deficit Abdomen soft nontender but mild discomfort behind his chest wall after eating Chest clear no added sound  Data Reviewed: I have personally reviewed following labs and imaging studies Potassium 3.4 BUN/creatinine 12/0.8 Albumin 2.9 White count up to 3.2 hemoglobin 8.6, ANC 2600 currently   Radiology Studies: DG Chest Portable 1 View  Result Date: 12/14/2019 CLINICAL DATA:  Shortness of breath.  Known lung cancer. EXAM: PORTABLE CHEST 1 VIEW COMPARISON:  CT chest dated 08/29/2019 FINDINGS: The heart size and mediastinal contours are within normal limits. Masslike consolidation in the right lung base and peripheral reticulation in the right mid lung likely represents the patient's known malignancy. There is mild left basilar reticulation. There is no left pleural effusion. There is no pneumothorax. The visualized skeletal structures are unremarkable. IMPRESSION: Masslike consolidation in the right lung base and peripheral reticulation in the right mid lung likely represents the patient's known malignancy. Mild left basilar reticulation is nonspecific and may represent atelectasis, infection, edema, or post treatment changes of the patient received radiation to this area. Electronically Signed   By: Zerita Boers M.D.   On: 12/14/2019 12:41     Scheduled Meds: . aspirin EC  81 mg Oral Daily  . clopidogrel  75 mg Oral QHS  . enoxaparin (LOVENOX) injection  40 mg Subcutaneous Q24H  . famotidine  40 mg Oral QHS  . fluticasone  1 spray Each Nare Daily  . hydrocortisone cream   Topical BID  . pantoprazole  40 mg Oral Daily  . Tbo-filgastrim (GRANIX) SQ  300 mcg Subcutaneous q1800  . thiothixene  2 mg Oral TID  Continue aspirin and Plavix Continuous Infusions: . sodium chloride 10 mL/hr at 12/14/19 1222  . ceFEPime (MAXIPIME) IV 2 g (12/16/19 0507)  . lactated ringers 75 mL/hr at 12/15/19  2237  . metronidazole 500 mg (12/16/19 0513)  . vancomycin 750 mg (12/16/19 1040)     LOS: 1 day    Time spent: [place], MD [facility] To contact the attending provider between 7A-7P or the covering provider during after hours 7P-7A, please log into the web site [email or web address] and access using universal [place] password for that web site. If you do not have the password, please call the hospital operator.  12/16/2019, 11:14 AM

## 2019-12-17 LAB — CBC WITH DIFFERENTIAL/PLATELET
Abs Immature Granulocytes: 0.27 10*3/uL — ABNORMAL HIGH (ref 0.00–0.07)
Basophils Absolute: 0 10*3/uL (ref 0.0–0.1)
Basophils Relative: 1 %
Eosinophils Absolute: 0 10*3/uL (ref 0.0–0.5)
Eosinophils Relative: 1 %
HCT: 27 % — ABNORMAL LOW (ref 39.0–52.0)
Hemoglobin: 9.3 g/dL — ABNORMAL LOW (ref 13.0–17.0)
Immature Granulocytes: 8 %
Lymphocytes Relative: 9 %
Lymphs Abs: 0.3 10*3/uL — ABNORMAL LOW (ref 0.7–4.0)
MCH: 30.6 pg (ref 26.0–34.0)
MCHC: 34.4 g/dL (ref 30.0–36.0)
MCV: 88.8 fL (ref 80.0–100.0)
Monocytes Absolute: 0.4 10*3/uL (ref 0.1–1.0)
Monocytes Relative: 11 %
Neutro Abs: 2.4 10*3/uL (ref 1.7–7.7)
Neutrophils Relative %: 70 %
Platelets: 204 10*3/uL (ref 150–400)
RBC: 3.04 MIL/uL — ABNORMAL LOW (ref 4.22–5.81)
RDW: 14.7 % (ref 11.5–15.5)
WBC: 3.4 10*3/uL — ABNORMAL LOW (ref 4.0–10.5)
nRBC: 0.6 % — ABNORMAL HIGH (ref 0.0–0.2)

## 2019-12-17 LAB — COMPREHENSIVE METABOLIC PANEL
ALT: 13 U/L (ref 0–44)
AST: 18 U/L (ref 15–41)
Albumin: 3.1 g/dL — ABNORMAL LOW (ref 3.5–5.0)
Alkaline Phosphatase: 41 U/L (ref 38–126)
Anion gap: 10 (ref 5–15)
BUN: 11 mg/dL (ref 8–23)
CO2: 23 mmol/L (ref 22–32)
Calcium: 8.4 mg/dL — ABNORMAL LOW (ref 8.9–10.3)
Chloride: 102 mmol/L (ref 98–111)
Creatinine, Ser: 0.84 mg/dL (ref 0.61–1.24)
GFR calc Af Amer: >60 mL/min (ref >60)
GFR calc non Af Amer: >60 mL/min (ref >60)
Glucose, Bld: 93 mg/dL (ref 70–99)
Potassium: 3.2 mmol/L — ABNORMAL LOW (ref 3.5–5.1)
Sodium: 135 mmol/L (ref 135–145)
Total Bilirubin: 1.1 mg/dL (ref 0.3–1.2)
Total Protein: 6.2 g/dL — ABNORMAL LOW (ref 6.5–8.1)

## 2019-12-17 MED ORDER — HYDROCORTISONE 1 % EX CREA
TOPICAL_CREAM | Freq: Two times a day (BID) | CUTANEOUS | 0 refills | Status: DC
Start: 2019-12-17 — End: 2020-02-10

## 2019-12-17 MED ORDER — AMOXICILLIN-POT CLAVULANATE 875-125 MG PO TABS: 1.0000 | ORAL_TABLET | Freq: Two times a day (BID) | ORAL | 0 refills | Status: DC

## 2019-12-17 NOTE — Discharge Summary (Signed)
Physician Discharge Summary  Melvin Foley JYN:[id number] DOB: [birth year] DOA: 12/14/2019  PCP: Wallie Char, FNP  Admit date: 12/14/2019 Discharge date: 12/17/2019  Time spent: 45 minutes  Recommendations for Outpatient Follow-up:  1. Needs screening labs in the next 1 week CBC differential as well as Chem-7 2. Recommend INR as well 3. Recommend outpatient symptomatic management for radiation esophagitis and radiation related burning in the right axilla/right clavicular region 4. Recommend further goals of care discussion as an outpatient  Discharge Diagnoses:  Active Problems:   Neutropenic fever ([place])   Neutropenia ([place])   Discharge Condition: Improved  Diet recommendation: Heart healthy  Filed Weights   12/14/19 1046  Weight: 69.4 kg    History of present illness:  63 home dwelling male RLL adeno CA diagnosed 09/05/2018 Follows with Dr. Vashti Hey at the [facility] Dr. Lurline Hare at [facility] and previously gets his XRT at [facility] He also has a history of OSA schizophrenia stroke COPD and bipolar He quit smoking 1997 smoked for 24 years History of left carotid endarterectomy 2019 Prior stroke on Plavix Receivedsixth cycle of carboplatin paclitaxel last week-started feeling uncomfortable probably 8/23 2021 Went to [facility] on 11/2619 and had to get saline infusion because he said he was dehydrated Also had 1 episode or 2 episodes of diarrhea around that time  Had a fever on 8/27 100.4 and worsening cough  Went to [place] to have a brief run of possible polymorphic V. tach there he was given magnesium for this Was transferred to [facility] for admission and further management of neutropenic fever and episodic polymorphic V. tach    Hospital Course:   1. 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 Not better and augmenting meds b. Trialed Carafate in addition to other medications and was unable to use them-currently  successfully tolerating some p.o. and will have to see what his specialist outside c. Will need outpatient management for this at [facility] 2. Neutropenic fever and overall neutropenia a. Secondary to carboplatin and paclitaxel most likely and timing coincides with the same 7 to 10 days out from the same b. Received Granix until 8/30 c. Narrowed off of broad-spectrum antibiotics given NGTD on cultures and transition to Augmentin 3. Stage IIIb adenocarcinoma status post XRT on?  Carboplatin paclitaxel a. Patient gets majority of cancer care at the [place] [facility] b. Likely can follow-up at [facility] in the outpatient 4. Torsades? a. 14 beats apparently on admission at [facility] b. No recurrecne-d/c monitors 5. OSA 6. Schizophrenia a. Counseled earlier in admission regarding his antipsychotic thiothixene which she has been taking since his teenage years b. Known side effects of torsades, granulocytopenia-have cut back the dose from 5 to 2 mg 3 times daily c. He will need follow-up with psychiatry as an outpatient regarding this 7. Carotid endarterectomy 2019+ prior strokes a. Continuing aspirin and Plavix 8. Prior smoker  Procedures:  Multiple scans (i.e. Studies not automatically included, echos, thoracentesis, etc; not x-rays)  Consultations:  None  Discharge Exam: Vitals:   12/16/19 2129 12/17/19 0518  BP: 107/65 106/74  Pulse: 84 90  Resp: 20 18  Temp: 99.2 F (37.3 C) 99.3 F (37.4 C)  SpO2: 98% 97%    General: Awake alert coherent tolerating some p.o. EOMI NCAT no focal deficit Cardiovascular: S1-S2 no murmur rub or gallop Respiratory: Clear no added sound Abdomen soft  Discharge Instructions   Discharge Instructions  Diet - low sodium heart healthy   Complete by: As directed    Discharge instructions   Complete by: As directed    Finish all antibiotics without fail as this will help with what ever might have been going on in your immune  system and will take care of the majority of bugs that could have been a source of your infection-remember that based on my discussion with you we did not find a source My recommendation is you follow-up with your regular oncologist in the outpatient setting in the next several weeks to discuss symptomatic management of your radiation esophagitis and other issues-we did trial various medications but if they gave you with side effects and did not seem to work I do not think they would benefit you longer-term Watch for significant fever and chills feels in the outpatient setting and or diarrhea as sometimes you can get side effects of treatment Best of luck please follow-up with your specialists   Increase activity slowly   Complete by: As directed      Allergies as of 12/17/2019   No Known Allergies     Medication List    TAKE these medications   albuterol 108 (90 Base) MCG/ACT inhaler Commonly known as: VENTOLIN HFA Inhale 1-2 puffs into the lungs every 6 (six) hours as needed for wheezing or shortness of breath.   amoxicillin-clavulanate 875-125 MG tablet Commonly known as: AUGMENTIN Take 1 tablet by mouth every 12 (twelve) hours.   clopidogrel 75 MG tablet Commonly known as: PLAVIX Take 1 tablet (75 mg total) by mouth at bedtime.   famotidine 40 MG tablet Commonly known as: PEPCID Take 40 mg by mouth at bedtime.   fluticasone 50 MCG/ACT nasal spray Commonly known as: FLONASE Place 2 sprays into both nostrils daily.   guaiFENesin 600 MG 12 hr tablet Commonly known as: MUCINEX Take 600 mg by mouth 2 (two) times daily.   hydrocortisone cream 1 % Apply topically 2 (two) times daily.   omeprazole 20 MG capsule Commonly known as: PRILOSEC Take 20 mg by mouth at bedtime.   sildenafil 100 MG tablet Commonly known as: VIAGRA Take 100 mg by mouth as needed for erectile dysfunction.   thiothixene 2 MG capsule Commonly known as: NAVANE Take 2 mg by mouth 3 (three) times  daily.      No Known Allergies    The results of significant diagnostics from this hospitalization (including imaging, microbiology, ancillary and laboratory) are listed below for reference.    Significant Diagnostic Studies: DG Chest Portable 1 View  Result Date: 12/14/2019 CLINICAL DATA:  Shortness of breath.  Known lung cancer. EXAM: PORTABLE CHEST 1 VIEW COMPARISON:  CT chest dated 08/29/2019 FINDINGS: The heart size and mediastinal contours are within normal limits. Masslike consolidation in the right lung base and peripheral reticulation in the right mid lung likely represents the patient's known malignancy. There is mild left basilar reticulation. There is no left pleural effusion. There is no pneumothorax. The visualized skeletal structures are unremarkable. IMPRESSION: Masslike consolidation in the right lung base and peripheral reticulation in the right mid lung likely represents the patient's known malignancy. Mild left basilar reticulation is nonspecific and may represent atelectasis, infection, edema, or post treatment changes of the patient received radiation to this area. Electronically Signed   By: Zerita Boers M.D.   On: 12/14/2019 12:41    Microbiology: Recent Results (from the past 240 hour(s))  Blood culture (routine x 2)     Status:  None (Preliminary result)   Collection Time: 12/14/19 11:12 AM   Specimen: BLOOD  Result Value Ref Range Status   Specimen Description   Final    BLOOD RIGHT ANTECUBITAL Performed at [facility], [place] [address], [place], [place] [address]    Special Requests   Final    BOTTLES DRAWN AEROBIC AND ANAEROBIC Blood Culture adequate volume Performed at [facility], [address] [place], [place] [address]    Culture   Final    NO GROWTH 3 DAYS Performed at [facility], [place] [address] [place], [place] [address]    Report Status PENDING  Incomplete  Blood culture (routine x 2)     Status: None (Preliminary  result)   Collection Time: 12/14/19 11:15 AM   Specimen: BLOOD  Result Value Ref Range Status   Specimen Description   Final    BLOOD LEFT ANTECUBITAL Performed at [facility], [place] [address], [place], [place] [address]    Special Requests   Final    BOTTLES DRAWN AEROBIC AND ANAEROBIC Blood Culture adequate volume Performed at [facility], [address] [place], [place] [address]    Culture   Final    NO GROWTH 3 DAYS Performed at [facility], [place] [address] [place], [place] [address]    Report Status PENDING  Incomplete  SARS Coronavirus 2 by RT PCR (hospital order, performed in [facility] hospital lab) Nasopharyngeal     Status: None   Collection Time: 12/14/19 11:24 AM   Specimen: Nasopharyngeal  Result Value Ref Range Status   SARS Coronavirus 2 NEGATIVE NEGATIVE Final    Comment: (NOTE) SARS-CoV-2 target nucleic acids are NOT DETECTED.  The SARS-CoV-2 RNA is generally detectable in upper and lower respiratory specimens during the acute phase of infection. The lowest concentration of SARS-CoV-2 viral copies this assay can detect is 250 copies / mL. A negative result does not preclude SARS-CoV-2 infection and should not be used as the sole basis for treatment or other patient management decisions.  A negative result may occur with improper specimen collection / handling, submission of specimen other than nasopharyngeal swab, presence of viral mutation(s) within the areas targeted by this assay, and inadequate number of viral copies (<250 copies / mL). A negative result must be combined with clinical observations, patient history, and epidemiological information.  Fact Sheet for Patients:   StrictlyIdeas.no  Fact Sheet for Healthcare Providers: BankingDealers.co.za  This test is not yet approved or  cleared by the [place] FDA and has been authorized for detection and/or diagnosis of  SARS-CoV-2 by FDA under an Emergency Use Authorization (EUA).  This EUA will remain in effect (meaning this test can be used) for the duration of the COVID-19 declaration under Section 564(b)(1) of the Act, 21 U.S.C. section 360bbb-3(b)(1), unless the authorization is terminated or revoked sooner.  Performed at [facility], [address] [place], [place] [address]      Labs: Basic Metabolic Panel: Recent Labs  Lab 12/14/19 1117 12/15/19 0538 12/16/19 0824 12/17/19 0531  NA 140 136 137 135  K 3.7 3.6 3.4* 3.2*  CL 105 105 106 102  CO2 25 22 22 23   GLUCOSE 117* 97 96 93  BUN 17 14 12 11   CREATININE 0.93 0.81 0.88 0.84  CALCIUM 8.5* 8.1* 7.9* 8.4*  MG 1.5*  --  1.5*  --    Liver Function Tests:  Recent Labs  Lab 12/14/19 1117 12/15/19 0538 12/16/19 0824 12/17/19 0531  AST 16 15 19 18   ALT 13 10 13 13   ALKPHOS 48 40 38 41  BILITOT 1.2 1.0 1.0 1.1  PROT 6.6 5.8* 5.8* 6.2*  ALBUMIN 3.4* 3.0* 2.9* 3.1*   No results for input(s): LIPASE, AMYLASE in the last 168 hours. No results for input(s): AMMONIA in the last 168 hours. CBC: Recent Labs  Lab 12/14/19 1117 12/15/19 0538 12/16/19 0824 12/17/19 0531  WBC 1.2* 1.1* 3.2* 3.4*  NEUTROABS 1.0* 0.8* 2.6 2.4  HGB 10.1* 8.5* 8.6* 9.3*  HCT 29.6* 24.9* 25.7* 27.0*  MCV 89.2 88.3 89.5 88.8  PLT 171 162 174 204   Cardiac Enzymes: No results for input(s): CKTOTAL, CKMB, CKMBINDEX, TROPONINI in the last 168 hours. BNP: BNP (last 3 results) No results for input(s): BNP in the last 8760 hours.  ProBNP (last 3 results) No results for input(s): PROBNP in the last 8760 hours.  CBG: No results for input(s): GLUCAP in the last 168 hours.     Signed:  Nita Sells MD   [facility] 12/17/2019, 9:11 AM

## 2019-12-19 LAB — CULTURE, BLOOD (ROUTINE X 2)
Culture: NO GROWTH
Culture: NO GROWTH
Special Requests: ADEQUATE
Special Requests: ADEQUATE

## 2019-12-20 ENCOUNTER — Other Ambulatory Visit: Payer: Self-pay

## 2019-12-20 ENCOUNTER — Inpatient Hospital Stay (HOSPITAL_BASED_OUTPATIENT_CLINIC_OR_DEPARTMENT_OTHER)
Admission: EM | Admit: 2019-12-20 | Discharge: 2019-12-27 | DRG: 392 | Disposition: A | Discharge status: Home Health | Payer: No Typology Code available for payment source | Attending: Internal Medicine | Admitting: Internal Medicine

## 2019-12-20 ENCOUNTER — Other Ambulatory Visit: Payer: Self-pay | Admitting: Physician Assistant

## 2019-12-20 ENCOUNTER — Encounter (HOSPITAL_BASED_OUTPATIENT_CLINIC_OR_DEPARTMENT_OTHER): Payer: Self-pay

## 2019-12-20 ENCOUNTER — Emergency Department (HOSPITAL_BASED_OUTPATIENT_CLINIC_OR_DEPARTMENT_OTHER): Payer: No Typology Code available for payment source

## 2019-12-20 DIAGNOSIS — D630 Anemia in neoplastic disease: Secondary | ICD-10-CM | POA: Diagnosis present

## 2019-12-20 DIAGNOSIS — Z8249 Family history of ischemic heart disease and other diseases of the circulatory system: Secondary | ICD-10-CM

## 2019-12-20 DIAGNOSIS — E871 Hypo-osmolality and hyponatremia: Secondary | ICD-10-CM | POA: Diagnosis present

## 2019-12-20 DIAGNOSIS — Z66 Do not resuscitate: Secondary | ICD-10-CM | POA: Diagnosis present

## 2019-12-20 DIAGNOSIS — Z20822 Contact with and (suspected) exposure to covid-19: Secondary | ICD-10-CM | POA: Diagnosis present

## 2019-12-20 DIAGNOSIS — R0602 Shortness of breath: Secondary | ICD-10-CM

## 2019-12-20 DIAGNOSIS — C3431 Malignant neoplasm of lower lobe, right bronchus or lung: Secondary | ICD-10-CM | POA: Diagnosis present

## 2019-12-20 DIAGNOSIS — E860 Dehydration: Secondary | ICD-10-CM | POA: Diagnosis present

## 2019-12-20 DIAGNOSIS — J449 Chronic obstructive pulmonary disease, unspecified: Secondary | ICD-10-CM | POA: Diagnosis present

## 2019-12-20 DIAGNOSIS — G4733 Obstructive sleep apnea (adult) (pediatric): Secondary | ICD-10-CM | POA: Diagnosis present

## 2019-12-20 DIAGNOSIS — I6529 Occlusion and stenosis of unspecified carotid artery: Secondary | ICD-10-CM | POA: Diagnosis present

## 2019-12-20 DIAGNOSIS — Z95828 Presence of other vascular implants and grafts: Secondary | ICD-10-CM

## 2019-12-20 DIAGNOSIS — K449 Diaphragmatic hernia without obstruction or gangrene: Secondary | ICD-10-CM | POA: Diagnosis present

## 2019-12-20 DIAGNOSIS — K208 Other esophagitis without bleeding: Secondary | ICD-10-CM | POA: Diagnosis not present

## 2019-12-20 DIAGNOSIS — T66XXXA Radiation sickness, unspecified, initial encounter: Secondary | ICD-10-CM

## 2019-12-20 DIAGNOSIS — E876 Hypokalemia: Secondary | ICD-10-CM | POA: Diagnosis present

## 2019-12-20 DIAGNOSIS — R5081 Fever presenting with conditions classified elsewhere: Secondary | ICD-10-CM | POA: Diagnosis present

## 2019-12-20 DIAGNOSIS — B258 Other cytomegaloviral diseases: Secondary | ICD-10-CM | POA: Diagnosis present

## 2019-12-20 DIAGNOSIS — R627 Adult failure to thrive: Secondary | ICD-10-CM

## 2019-12-20 DIAGNOSIS — R509 Fever, unspecified: Secondary | ICD-10-CM | POA: Diagnosis present

## 2019-12-20 DIAGNOSIS — C771 Secondary and unspecified malignant neoplasm of intrathoracic lymph nodes: Secondary | ICD-10-CM | POA: Diagnosis present

## 2019-12-20 DIAGNOSIS — R1310 Dysphagia, unspecified: Secondary | ICD-10-CM | POA: Diagnosis present

## 2019-12-20 DIAGNOSIS — Z87891 Personal history of nicotine dependence: Secondary | ICD-10-CM

## 2019-12-20 DIAGNOSIS — Z8673 Personal history of transient ischemic attack (TIA), and cerebral infarction without residual deficits: Secondary | ICD-10-CM

## 2019-12-20 DIAGNOSIS — R6251 Failure to thrive (child): Secondary | ICD-10-CM | POA: Diagnosis present

## 2019-12-20 DIAGNOSIS — I444 Left anterior fascicular block: Secondary | ICD-10-CM | POA: Diagnosis present

## 2019-12-20 DIAGNOSIS — I10 Essential (primary) hypertension: Secondary | ICD-10-CM | POA: Diagnosis present

## 2019-12-20 DIAGNOSIS — E785 Hyperlipidemia, unspecified: Secondary | ICD-10-CM | POA: Diagnosis present

## 2019-12-20 DIAGNOSIS — Z823 Family history of stroke: Secondary | ICD-10-CM

## 2019-12-20 DIAGNOSIS — Y842 Radiological procedure and radiotherapy as the cause of abnormal reaction of the patient, or of later complication, without mention of misadventure at the time of the procedure: Secondary | ICD-10-CM | POA: Diagnosis present

## 2019-12-20 DIAGNOSIS — Z79899 Other long term (current) drug therapy: Secondary | ICD-10-CM

## 2019-12-20 DIAGNOSIS — Z9221 Personal history of antineoplastic chemotherapy: Secondary | ICD-10-CM

## 2019-12-20 DIAGNOSIS — D509 Iron deficiency anemia, unspecified: Secondary | ICD-10-CM | POA: Diagnosis present

## 2019-12-20 DIAGNOSIS — D709 Neutropenia, unspecified: Secondary | ICD-10-CM

## 2019-12-20 DIAGNOSIS — F209 Schizophrenia, unspecified: Secondary | ICD-10-CM | POA: Diagnosis present

## 2019-12-20 DIAGNOSIS — Z7902 Long term (current) use of antithrombotics/antiplatelets: Secondary | ICD-10-CM

## 2019-12-20 DIAGNOSIS — F319 Bipolar disorder, unspecified: Secondary | ICD-10-CM | POA: Diagnosis present

## 2019-12-20 LAB — COMPREHENSIVE METABOLIC PANEL
ALT: 12 U/L (ref 0–44)
AST: 26 U/L (ref 15–41)
Albumin: 3.4 g/dL — ABNORMAL LOW (ref 3.5–5.0)
Alkaline Phosphatase: 46 U/L (ref 38–126)
Anion gap: 10 (ref 5–15)
BUN: 12 mg/dL (ref 8–23)
CO2: 25 mmol/L (ref 22–32)
Calcium: 8.3 mg/dL — ABNORMAL LOW (ref 8.9–10.3)
Chloride: 99 mmol/L (ref 98–111)
Creatinine, Ser: 1.01 mg/dL (ref 0.61–1.24)
GFR calc Af Amer: >60 mL/min (ref >60)
GFR calc non Af Amer: >60 mL/min (ref >60)
Glucose, Bld: 109 mg/dL — ABNORMAL HIGH (ref 70–99)
Potassium: 3.3 mmol/L — ABNORMAL LOW (ref 3.5–5.1)
Sodium: 134 mmol/L — ABNORMAL LOW (ref 135–145)
Total Bilirubin: 1.1 mg/dL (ref 0.3–1.2)
Total Protein: 7 g/dL (ref 6.5–8.1)

## 2019-12-20 LAB — CBC WITH DIFFERENTIAL/PLATELET
Abs Immature Granulocytes: 0.33 10*3/uL — ABNORMAL HIGH (ref 0.00–0.07)
Basophils Absolute: 0 10*3/uL (ref 0.0–0.1)
Basophils Relative: 1 %
Eosinophils Absolute: 0 10*3/uL (ref 0.0–0.5)
Eosinophils Relative: 0 %
HCT: 27.6 % — ABNORMAL LOW (ref 39.0–52.0)
Hemoglobin: 9.2 g/dL — ABNORMAL LOW (ref 13.0–17.0)
Immature Granulocytes: 10 %
Lymphocytes Relative: 14 %
Lymphs Abs: 0.5 10*3/uL — ABNORMAL LOW (ref 0.7–4.0)
MCH: 30.7 pg (ref 26.0–34.0)
MCHC: 33.3 g/dL (ref 30.0–36.0)
MCV: 92 fL (ref 80.0–100.0)
Monocytes Absolute: 0.8 10*3/uL (ref 0.1–1.0)
Monocytes Relative: 24 %
Neutro Abs: 1.7 10*3/uL (ref 1.7–7.7)
Neutrophils Relative %: 51 %
Platelets: 307 10*3/uL (ref 150–400)
RBC: 3 MIL/uL — ABNORMAL LOW (ref 4.22–5.81)
RDW: 16.3 % — ABNORMAL HIGH (ref 11.5–15.5)
Smear Review: NORMAL
WBC: 3.4 10*3/uL — ABNORMAL LOW (ref 4.0–10.5)
nRBC: 0.6 % — ABNORMAL HIGH (ref 0.0–0.2)

## 2019-12-20 LAB — URINALYSIS, ROUTINE W REFLEX MICROSCOPIC
Bilirubin Urine: NEGATIVE
Glucose, UA: NEGATIVE mg/dL
Hgb urine dipstick: NEGATIVE
Ketones, ur: 15 mg/dL — AB
Leukocytes,Ua: NEGATIVE
Nitrite: NEGATIVE
Protein, ur: NEGATIVE mg/dL
Specific Gravity, Urine: 1.02 (ref 1.005–1.030)
pH: 6 (ref 5.0–8.0)

## 2019-12-20 LAB — LACTIC ACID, PLASMA
Lactic Acid, Venous: 2.8 mmol/L (ref 0.5–1.9)
Lactic Acid, Venous: 1.7 mmol/L (ref 0.5–1.9)

## 2019-12-20 LAB — TROPONIN I (HIGH SENSITIVITY)
Troponin I (High Sensitivity): 11 ng/L (ref <18)
Troponin I (High Sensitivity): 10 ng/L

## 2019-12-20 LAB — PROTIME-INR
INR: 1.2 (ref 0.8–1.2)
Prothrombin Time: 14.4 seconds (ref 11.4–15.2)

## 2019-12-20 LAB — SARS CORONAVIRUS 2 BY RT PCR (HOSPITAL ORDER, PERFORMED IN ~~LOC~~ HOSPITAL LAB): SARS Coronavirus 2: NEGATIVE

## 2019-12-20 LAB — MAGNESIUM: Magnesium: 1.4 mg/dL — ABNORMAL LOW (ref 1.7–2.4)

## 2019-12-20 MED ORDER — SODIUM CHLORIDE 0.9 % IV BOLUS
500.0000 mL | Freq: Once | INTRAVENOUS | Status: AC
  Administered 2019-12-20: 500 mL via INTRAVENOUS

## 2019-12-20 MED ORDER — MAGNESIUM SULFATE 2 GM/50ML IV SOLN
2.0000 g | Freq: Once | INTRAVENOUS | Status: AC
  Administered 2019-12-20: 2 g via INTRAVENOUS
  Filled 2019-12-20: qty 50

## 2019-12-20 MED ORDER — IOHEXOL 350 MG/ML SOLN
100.0000 mL | Freq: Once | INTRAVENOUS | Status: AC | PRN
  Administered 2019-12-20: 100 mL via INTRAVENOUS

## 2019-12-20 MED ORDER — SODIUM CHLORIDE 0.9 % IV BOLUS: 250.0000 mL | Freq: Once | INTRAVENOUS | Status: DC

## 2019-12-20 MED ORDER — POTASSIUM CHLORIDE 10 MEQ/100ML IV SOLN
10.0000 meq | Freq: Once | INTRAVENOUS | Status: AC
  Administered 2019-12-20: 10 meq via INTRAVENOUS
  Filled 2019-12-20: qty 100

## 2019-12-20 NOTE — ED Notes (Signed)
ED Provider at bedside. 

## 2019-12-20 NOTE — ED Triage Notes (Signed)
Pt c/o ShOB that started this AM. Pt reports elevated temp at home of 99.9. Pt with history of lung CA. Pt having associated runny nose, congestion.

## 2019-12-20 NOTE — ED Provider Notes (Signed)
[facility] EMERGENCY DEPARTMENT Provider Note   CSN: [id number] Arrival date & time: 12/20/19  1323     History Chief Complaint  Patient presents with  . Shortness of Breath    Melvin Foley is a 74 y.o. male.  HPI 74 year old male with a history of lung cancer currently on chemo and radiation, awaiting immunotherapy, hypertension, bipolar 1 disorder, GERD, stroke/TIA, hyperlipidemia recently admitted to the hospital on 8/28 for sepsis and cardiac failure, discharged on 8/31.  Patient presents today complaining of chest pain, shortness of breath, runny nose and congestion.  He states that his symptoms have been ongoing for the last 3 weeks.  States he decided to come to the ER because he could not take it anymore. Reports temp of 99.  There have been no new changes or worsening of his symptoms.  He is followed by [facility] oncology. Denies any headache, dizziness, weakness, back pain, syncope, dysuria, hemoptysis    Past Medical History:  Diagnosis Date  . Anxiety    takes medication  . Arthritis   . Bipolar 1 disorder ([place])   . Bronchitis   . Cancer ([place])   . Carotid stenosis, left   . GERD (gastroesophageal reflux disease)   . H/O hiatal hernia   . Hypertension   . Shortness of breath   . Sleep apnea    2 YEARS  [place]   . Stroke [facility])     Patient Active Problem List   Diagnosis Date Noted  . Failure to thrive (0-17) 12/20/2019  . Neutropenia ([place]) 12/15/2019  . Neutropenic fever ([place]) 12/14/2019  . Carotid stenosis 05/03/2017  . Difficulty with speech   . TIA (transient ischemic attack) 04/30/2017  . Hyperlipidemia 04/30/2017  . Proximal humerus fracture 03/02/2011    Past Surgical History:  Procedure Laterality Date  . CHOLECYSTECTOMY    . ENDARTERECTOMY Left 05/03/2017   Procedure: ENDARTERECTOMY CAROTID LEFT;  Surgeon: Rosetta Posner, MD;  Location: [facility];  Service: Vascular;  Laterality: Left;  . FRACTURE SURGERY     right ankle  . head  injury    . KNEE SURGERY     x2  . ORIF SHOULDER FRACTURE  03/03/2011   Procedure: OPEN REDUCTION INTERNAL FIXATION (ORIF) SHOULDER FRACTURE;  Surgeon: Augustin Schooling;  Location: [facility];  Service: Orthopedics;  Laterality: Left;  LEFT PROXIMAL HUMERUS Open Reduction internal fixation  . SHOULDER ARTHROSCOPY WITH SUBACROMIAL DECOMPRESSION AND BICEP TENDON REPAIR  02/24/2012   Procedure: SHOULDER ARTHROSCOPY WITH SUBACROMIAL DECOMPRESSION AND BICEP TENDON REPAIR;  Surgeon: Augustin Schooling, MD;  Location: [place];  Service: Orthopedics;  Laterality: Left;  with capsular release and lysis of adhesions  . TONSILLECTOMY         Family History  Problem Relation Age of Onset  . Congestive Heart Failure Mother   . Stroke Mother   . Congestive Heart Failure Father     Social History   Tobacco Use  . Smoking status: Former Smoker    Packs/day: 1.50  . Smokeless tobacco: Never Used  . Tobacco comment: stopped smoking 20 years ago  Vaping Use  . Vaping Use: Never used  Substance Use Topics  . Alcohol use: No    Comment: past 20 years ago  . Drug use: Not Currently    Types: Marijuana, LSD    Home Medications Prior to Admission medications   Medication Sig Start Date End Date Taking? Authorizing Provider  albuterol (VENTOLIN HFA) 108 (90 Base) MCG/ACT  inhaler Inhale 1-2 puffs into the lungs every 6 (six) hours as needed for wheezing or shortness of breath.    [provider]  amoxicillin-clavulanate (AUGMENTIN) 875-125 MG tablet Take 1 tablet by mouth every 12 (twelve) hours. 12/17/19   Nita Sells, MD  clopidogrel (PLAVIX) 75 MG tablet Take 1 tablet (75 mg total) by mouth at bedtime. 05/01/17   Geradine Girt, DO  famotidine (PEPCID) 40 MG tablet Take 40 mg by mouth at bedtime.  06/30/19   [provider]  fluticasone (FLONASE) 50 MCG/ACT nasal spray Place 2 sprays into both nostrils daily.  01/08/19   [provider]  guaiFENesin (MUCINEX) 600 MG 12 hr  tablet Take 600 mg by mouth 2 (two) times daily.  08/06/19   [provider]  hydrocortisone cream 1 % Apply topically 2 (two) times daily. 12/17/19   Nita Sells, MD  omeprazole (PRILOSEC) 20 MG capsule Take 20 mg by mouth at bedtime.     [provider]  sildenafil (VIAGRA) 100 MG tablet Take 100 mg by mouth as needed for erectile dysfunction.  04/04/19   [provider]  thiothixene (NAVANE) 2 MG capsule Take 2 mg by mouth 3 (three) times daily.     [provider]    Allergies    Patient has no known allergies.  Review of Systems   Review of Systems  Constitutional: Positive for fever. Negative for chills.  HENT: Negative for ear pain and sore throat.   Eyes: Negative for pain and visual disturbance.  Respiratory: Positive for cough and shortness of breath.   Cardiovascular: Positive for chest pain. Negative for palpitations.  Gastrointestinal: Negative for abdominal pain and vomiting.  Genitourinary: Negative for dysuria and hematuria.  Musculoskeletal: Negative for arthralgias, back pain, neck pain and neck stiffness.  Skin: Negative for color change and rash.  Neurological: Negative for seizures and syncope.  All other systems reviewed and are negative.   Physical Exam Updated Vital Signs BP 111/65   Pulse 77   Temp 100 F (37.8 C) (Oral)   Resp (!) 28   Ht 5\' 8"  (1.727 m)   Wt 68.9 kg   SpO2 96%   BMI 23.11 kg/m   Physical Exam Vitals and nursing note reviewed.  Constitutional:      General: He is not in acute distress.    Appearance: He is well-developed. He is not ill-appearing, toxic-appearing or diaphoretic.     Comments: Pale appearing   HENT:     Head: Normocephalic and atraumatic.  Eyes:     Conjunctiva/sclera: Conjunctivae normal.  Cardiovascular:     Rate and Rhythm: Normal rate and regular rhythm.     Heart sounds: No murmur heard.   Pulmonary:     Effort: Pulmonary effort is normal. No respiratory  distress.     Breath sounds: Normal breath sounds. No decreased breath sounds, wheezing, rhonchi or rales.  Chest:     Chest wall: No tenderness.  Abdominal:     Palpations: Abdomen is soft.     Tenderness: There is no abdominal tenderness.  Musculoskeletal:     Cervical back: Neck supple.     Right lower leg: No tenderness. No edema.     Left lower leg: No tenderness. No edema.  Skin:    General: Skin is warm and dry.     Capillary Refill: Capillary refill takes less than 2 seconds.     Findings: No erythema or rash.  Neurological:  General: No focal deficit present.     Mental Status: He is alert and oriented to person, place, and time.  Psychiatric:        Mood and Affect: Mood normal.        Behavior: Behavior normal.     ED Results / Procedures / Treatments   Labs (all labs ordered are listed, but only abnormal results are displayed) Labs Reviewed  COMPREHENSIVE METABOLIC PANEL - Abnormal; Notable for the following components:      Result Value   Sodium 134 (*)    Potassium 3.3 (*)    Glucose, Bld 109 (*)    Calcium 8.3 (*)    Albumin 3.4 (*)    All other components within normal limits  LACTIC ACID, PLASMA - Abnormal; Notable for the following components:   Lactic Acid, Venous 2.8 (*)    All other components within normal limits  CBC WITH DIFFERENTIAL/PLATELET - Abnormal; Notable for the following components:   WBC 3.4 (*)    RBC 3.00 (*)    Hemoglobin 9.2 (*)    HCT 27.6 (*)    RDW 16.3 (*)    nRBC 0.6 (*)    Lymphs Abs 0.5 (*)    Abs Immature Granulocytes 0.33 (*)    All other components within normal limits  URINALYSIS, ROUTINE W REFLEX MICROSCOPIC - Abnormal; Notable for the following components:   Ketones, ur 15 (*)    All other components within normal limits  MAGNESIUM - Abnormal; Notable for the following components:   Magnesium 1.4 (*)    All other components within normal limits  SARS CORONAVIRUS 2 BY RT PCR (HOSPITAL ORDER, [facility])  CULTURE, BLOOD (ROUTINE X 2)  CULTURE, BLOOD (ROUTINE X 2)  URINE CULTURE  LACTIC ACID, PLASMA  PROTIME-INR  TROPONIN I (HIGH SENSITIVITY)  TROPONIN I (HIGH SENSITIVITY)    EKG EKG Interpretation  Date/Time:  Friday December 20 2019 14:49:51 EDT Ventricular Rate:  89 PR Interval:    QRS Duration: 88 QT Interval:  343 QTC Calculation: 418 R Axis:   -30 Text Interpretation: Sinus rhythm Left axis deviation Abnormal R-wave progression, early transition No significant change since last tracing Confirmed by Deno Etienne [phone]) on 12/20/2019 3:26:57 PM   Radiology CT Angio Chest PE W and/or Wo Contrast  Result Date: 12/20/2019 CLINICAL DATA:  PE suspected, high prob.  Lung cancer. EXAM: CT ANGIOGRAPHY CHEST WITH CONTRAST TECHNIQUE: Multidetector CT imaging of the chest was performed using the standard protocol during bolus administration of intravenous contrast. Multiplanar CT image reconstructions and MIPs were obtained to evaluate the vascular anatomy. CONTRAST:  141mL OMNIPAQUE IOHEXOL 350 MG/ML SOLN COMPARISON:  Radiograph earlier today.  Chest CTA 08/29/2019 FINDINGS: Cardiovascular: There are no filling defects within the pulmonary arteries to suggest pulmonary embolus. Again seen truncation of the right lower lobe pulmonary arteries in the region of masslike opacity. Aortic atherosclerosis without aneurysm or dissection. Left vertebral artery arises directly from the thoracic aorta. Heart is normal in size. No pericardial effusion. There are coronary artery calcifications. Mediastinum/Nodes: Stable 10 mm right hilar lymph node. Decreased size of lower paratracheal node measuring 9 mm, previously 12 mm. No new hilar or mediastinal adenopathy. No esophageal wall thickening. No visualized thyroid nodule. Lungs/Pleura: Decreased size of masslike consolidation in the right lower lobe with residual mass measuring approximately 3.9 x 3.3 cm, measured on series 6, image 50.  Adjacent irregular atelectasis. Small right pleural effusion and pleural thickening. The  adjacent 1 cm nodule in the medial right lung base on prior is no longer seen. Subpleural reticulation in the anterior right upper lobe series 6, image 37, with decreased soft tissue component from prior exam. Underlying emphysema and subpleural reticulation/fibrosis, unchanged. No findings of pulmonary edema. Upper Abdomen: No acute findings.  Post cholecystectomy. Musculoskeletal: There are no acute or suspicious osseous abnormalities. Bones are under mineralized. Degenerative change in the spine. Prior surgical fixation of the left proximal humerus. Review of the MIP images confirms the above findings. IMPRESSION: 1. No pulmonary embolus. 2. Decreased size of masslike consolidation in the right lower lobe with residual mass measuring approximately 3.9 x 3.3 cm. The adjacent 1 cm nodule in the medial right lung base on prior is no longer seen. Small right pleural effusion and pleural thickening. 3. Decreased soft tissue density of the subpleural reticulation in the right upper lobe from prior. 4. Slight decreased size of mediastinal lymph nodes. Aortic Atherosclerosis (ICD10-I70.0) and Emphysema (ICD10-J43.9). Electronically Signed   By: Keith Rake M.D.   On: 12/20/2019 18:13   DG Chest Port 1 View  Result Date: 12/20/2019 CLINICAL DATA:  Shortness of breath.  Lung cancer. EXAM: PORTABLE CHEST 1 VIEW COMPARISON:  Chest radiograph 12/14/2019.  CT 08/29/2019 FINDINGS: The heart size and mediastinal contours are within normal limits. Similar appearance of masslike consolidation in the right lung base, compatible with the patient's known malignancy. Similar reticulation in the left lung base. Right costophrenic opacity likely represents pleural scarring when correlating with prior chest CT. No left pleural effusion. No pneumothorax. No acute osseous abnormality. Partially imaged ORIF of the left humerus. Cholecystectomy  clips. IMPRESSION: 1. Similar appearance of masslike consolidation in the right lung base, compatible with the patient's known malignancy. 2. Similar reticulation in the left lung base, which may represent mild edema, infection, or post treatment change if the patient received radiation to this area Electronically Signed   By: Margaretha Sheffield MD   On: 12/20/2019 14:17    Procedures Procedures (including critical care time)  Medications Ordered in ED Medications  sodium chloride 0.9 % bolus 500 mL (0 mLs Intravenous Stopped 12/20/19 1625)  iohexol (OMNIPAQUE) 350 MG/ML injection 100 mL (100 mLs Intravenous Contrast Given 12/20/19 1735)  magnesium sulfate IVPB 2 g 50 mL (0 g Intravenous Stopped 12/20/19 1920)  potassium chloride 10 mEq in 100 mL IVPB (0 mEq Intravenous Stopped 12/20/19 2024)  sodium chloride 0.9 % bolus 500 mL (0 mLs Intravenous Stopped 12/20/19 2157)    ED Course  I have reviewed the triage vital signs and the nursing notes.  Pertinent labs & imaging results that were available during my care of the patient were reviewed by me and considered in my medical decision making (see chart for details).    MDM Rules/Calculators/A&P                         74 year old male with complaints of chest pain, shortness of breath that has been unchanged for the last 3 weeks Presentation, the patient is alert, oriented, nontoxic-appearing, however chronically ill-appearing.  Vitals with borderline fever of 100, oxygen saturations above 95%, overall reassuring.  No evidence of sepsis at this time. Physical exam with clear lung sounds, soft nontender abdomen  CBC with leukopenia of 3.4, largely unchanged.  Hemoglobin of 9.2, stable.  CMP with mild hyponatremia, potassium of 3.3.  Patient received IV potassium here in the ED.  Calcium of 8.3.  Normal renal  function.  Normal LFTs.  Delta troponin normal.  Initial lactate of 2.8, improved after 500 cc of fluids.  Chest x-ray without clear evidence of  pneumonia, largely unchanged from previous.  CT angio without evidence of PE, or pneumonia.  EKG unchanged from previous.  Covid negative.  Patient's wife arrived to the ED and states that the patient has not been eating or drinking secondary to the esophagitis.  He is followed by [facility] oncology, and states that they will normally go to the [place] but they are closed over the holiday weekend.  She expressed her concerns at the patient is not drinking and will require further visits to the ER for fluids and nutrition.  They do not have any GI follow-up.  Given concern for failure to thrive and malnutrition in the setting of neutropenic fever of unknown origin, consulted Dr. Darrell Jewel who will admit the patient for further evaluation and management.  We will hold antibiotics as the patient is currently afebrile.  Patient was seen and evaluated with Dr. Tyrone Nine who is agreeable to the above plan and disposition.   Final Clinical Impression(s) / ED Diagnoses Final diagnoses:  SOB (shortness of breath)  Failure to thrive in adult  Neutropenic fever ([place])    Rx / DC Orders    Garald Balding, PA-C 12/20/19 [place], [place], DO 12/20/19 2222

## 2019-12-20 NOTE — ED Notes (Signed)
Pt. Is here he reports because he is having difficulty breathing off and on with pain in his chest that is on going with his history of cancer.  Pt. Reports he is feeling dehydrated.  Pt. Also reports he has a cough on going and feels like he may be getting worse since he was last seen.

## 2019-12-20 NOTE — ED Notes (Signed)
Only able to obtain one set of blood cultures

## 2019-12-21 DIAGNOSIS — R509 Fever, unspecified: Secondary | ICD-10-CM | POA: Diagnosis not present

## 2019-12-21 DIAGNOSIS — I6529 Occlusion and stenosis of unspecified carotid artery: Secondary | ICD-10-CM | POA: Diagnosis not present

## 2019-12-21 DIAGNOSIS — E876 Hypokalemia: Secondary | ICD-10-CM | POA: Diagnosis present

## 2019-12-21 DIAGNOSIS — D630 Anemia in neoplastic disease: Secondary | ICD-10-CM | POA: Diagnosis not present

## 2019-12-21 DIAGNOSIS — C3431 Malignant neoplasm of lower lobe, right bronchus or lung: Secondary | ICD-10-CM | POA: Diagnosis not present

## 2019-12-21 DIAGNOSIS — C771 Secondary and unspecified malignant neoplasm of intrathoracic lymph nodes: Secondary | ICD-10-CM | POA: Diagnosis not present

## 2019-12-21 DIAGNOSIS — Z66 Do not resuscitate: Secondary | ICD-10-CM | POA: Diagnosis not present

## 2019-12-21 DIAGNOSIS — Z9221 Personal history of antineoplastic chemotherapy: Secondary | ICD-10-CM | POA: Diagnosis not present

## 2019-12-21 DIAGNOSIS — Z823 Family history of stroke: Secondary | ICD-10-CM | POA: Diagnosis not present

## 2019-12-21 DIAGNOSIS — F319 Bipolar disorder, unspecified: Secondary | ICD-10-CM | POA: Diagnosis not present

## 2019-12-21 DIAGNOSIS — Z20822 Contact with and (suspected) exposure to covid-19: Secondary | ICD-10-CM | POA: Diagnosis not present

## 2019-12-21 DIAGNOSIS — B258 Other cytomegaloviral diseases: Secondary | ICD-10-CM | POA: Diagnosis not present

## 2019-12-21 DIAGNOSIS — R1310 Dysphagia, unspecified: Secondary | ICD-10-CM | POA: Diagnosis present

## 2019-12-21 DIAGNOSIS — R6251 Failure to thrive (child): Secondary | ICD-10-CM | POA: Diagnosis not present

## 2019-12-21 DIAGNOSIS — T66XXXA Radiation sickness, unspecified, initial encounter: Secondary | ICD-10-CM

## 2019-12-21 DIAGNOSIS — R627 Adult failure to thrive: Secondary | ICD-10-CM | POA: Diagnosis present

## 2019-12-21 DIAGNOSIS — K208 Other esophagitis without bleeding: Secondary | ICD-10-CM | POA: Diagnosis present

## 2019-12-21 DIAGNOSIS — E860 Dehydration: Secondary | ICD-10-CM | POA: Diagnosis not present

## 2019-12-21 DIAGNOSIS — Y842 Radiological procedure and radiotherapy as the cause of abnormal reaction of the patient, or of later complication, without mention of misadventure at the time of the procedure: Secondary | ICD-10-CM | POA: Diagnosis present

## 2019-12-21 DIAGNOSIS — G4733 Obstructive sleep apnea (adult) (pediatric): Secondary | ICD-10-CM | POA: Diagnosis present

## 2019-12-21 DIAGNOSIS — Z8673 Personal history of transient ischemic attack (TIA), and cerebral infarction without residual deficits: Secondary | ICD-10-CM | POA: Diagnosis not present

## 2019-12-21 DIAGNOSIS — I10 Essential (primary) hypertension: Secondary | ICD-10-CM | POA: Diagnosis present

## 2019-12-21 DIAGNOSIS — E871 Hypo-osmolality and hyponatremia: Secondary | ICD-10-CM | POA: Diagnosis not present

## 2019-12-21 DIAGNOSIS — I444 Left anterior fascicular block: Secondary | ICD-10-CM | POA: Diagnosis present

## 2019-12-21 DIAGNOSIS — D509 Iron deficiency anemia, unspecified: Secondary | ICD-10-CM | POA: Diagnosis not present

## 2019-12-21 DIAGNOSIS — Z7902 Long term (current) use of antithrombotics/antiplatelets: Secondary | ICD-10-CM | POA: Diagnosis not present

## 2019-12-21 DIAGNOSIS — J449 Chronic obstructive pulmonary disease, unspecified: Secondary | ICD-10-CM | POA: Diagnosis present

## 2019-12-21 DIAGNOSIS — Z95828 Presence of other vascular implants and grafts: Secondary | ICD-10-CM | POA: Diagnosis not present

## 2019-12-21 DIAGNOSIS — E785 Hyperlipidemia, unspecified: Secondary | ICD-10-CM | POA: Diagnosis not present

## 2019-12-21 DIAGNOSIS — R0602 Shortness of breath: Secondary | ICD-10-CM | POA: Diagnosis present

## 2019-12-21 DIAGNOSIS — F209 Schizophrenia, unspecified: Secondary | ICD-10-CM | POA: Diagnosis present

## 2019-12-21 DIAGNOSIS — K449 Diaphragmatic hernia without obstruction or gangrene: Secondary | ICD-10-CM | POA: Diagnosis present

## 2019-12-21 LAB — BASIC METABOLIC PANEL
Anion gap: 8 (ref 5–15)
BUN: 9 mg/dL (ref 8–23)
CO2: 23 mmol/L (ref 22–32)
Calcium: 7.6 mg/dL — ABNORMAL LOW (ref 8.9–10.3)
Chloride: 102 mmol/L (ref 98–111)
Creatinine, Ser: 0.94 mg/dL (ref 0.61–1.24)
GFR calc Af Amer: >60 mL/min (ref >60)
GFR calc non Af Amer: >60 mL/min (ref >60)
Glucose, Bld: 93 mg/dL (ref 70–99)
Potassium: 3.3 mmol/L — ABNORMAL LOW (ref 3.5–5.1)
Sodium: 133 mmol/L — ABNORMAL LOW (ref 135–145)

## 2019-12-21 LAB — CBC
HCT: 22.4 % — ABNORMAL LOW (ref 39.0–52.0)
Hemoglobin: 7.3 g/dL — ABNORMAL LOW (ref 13.0–17.0)
MCH: 29.7 pg (ref 26.0–34.0)
MCHC: 32.6 g/dL (ref 30.0–36.0)
MCV: 91.1 fL (ref 80.0–100.0)
Platelets: 239 10*3/uL (ref 150–400)
RBC: 2.46 MIL/uL — ABNORMAL LOW (ref 4.22–5.81)
RDW: 16.4 % — ABNORMAL HIGH (ref 11.5–15.5)
WBC: 3 10*3/uL — ABNORMAL LOW (ref 4.0–10.5)
nRBC: 0 % (ref 0.0–0.2)

## 2019-12-21 LAB — CBC WITH DIFFERENTIAL/PLATELET
Abs Immature Granulocytes: 0 10*3/uL (ref 0.00–0.07)
Basophils Absolute: 0 10*3/uL (ref 0.0–0.1)
Basophils Relative: 0 %
Eosinophils Absolute: 0 10*3/uL (ref 0.0–0.5)
Eosinophils Relative: 0 %
HCT: 23.8 % — ABNORMAL LOW (ref 39.0–52.0)
Hemoglobin: 8 g/dL — ABNORMAL LOW (ref 13.0–17.0)
Lymphocytes Relative: 18 %
Lymphs Abs: 0.6 10*3/uL — ABNORMAL LOW (ref 0.7–4.0)
MCH: 30.7 pg (ref 26.0–34.0)
MCHC: 33.6 g/dL (ref 30.0–36.0)
MCV: 91.2 fL (ref 80.0–100.0)
Monocytes Absolute: 0.3 10*3/uL (ref 0.1–1.0)
Monocytes Relative: 8 %
Neutro Abs: 2.4 10*3/uL (ref 1.7–7.7)
Neutrophils Relative %: 74 %
Platelets: 237 10*3/uL (ref 150–400)
RBC: 2.61 MIL/uL — ABNORMAL LOW (ref 4.22–5.81)
RDW: 16.3 % — ABNORMAL HIGH (ref 11.5–15.5)
WBC: 3.3 10*3/uL — ABNORMAL LOW (ref 4.0–10.5)
nRBC: 0 % (ref 0.0–0.2)

## 2019-12-21 MED ORDER — BOOST / RESOURCE BREEZE PO LIQD CUSTOM
1.0000 | Freq: Three times a day (TID) | ORAL | Status: DC
  Administered 2019-12-21 – 2019-12-22 (×3): 1 via ORAL

## 2019-12-21 MED ORDER — ACETAMINOPHEN 325 MG PO TABS: 650.0000 mg | ORAL_TABLET | Freq: Four times a day (QID) | ORAL | Status: DC | PRN

## 2019-12-21 MED ORDER — ACETAMINOPHEN 325 MG PO TABS
650.0000 mg | ORAL_TABLET | Freq: Four times a day (QID) | ORAL | Status: DC
  Administered 2019-12-21 – 2019-12-24 (×10): 650 mg via ORAL
  Filled 2019-12-21 (×12): qty 2

## 2019-12-21 MED ORDER — FLUCONAZOLE IN SODIUM CHLORIDE 200-0.9 MG/100ML-% IV SOLN
200.0000 mg | INTRAVENOUS | Status: DC
  Administered 2019-12-22 – 2019-12-25 (×4): 200 mg via INTRAVENOUS
  Filled 2019-12-21 (×5): qty 100

## 2019-12-21 MED ORDER — OXYCODONE HCL 5 MG PO TABS
5.0000 mg | ORAL_TABLET | ORAL | Status: DC | PRN
  Administered 2019-12-23: 5 mg via ORAL
  Filled 2019-12-21 (×2): qty 1

## 2019-12-21 MED ORDER — ONDANSETRON HCL 4 MG PO TABS: 4.0000 mg | ORAL_TABLET | Freq: Four times a day (QID) | ORAL | Status: DC | PRN

## 2019-12-21 MED ORDER — POTASSIUM CHLORIDE 10 MEQ/100ML IV SOLN
10.0000 meq | INTRAVENOUS | Status: AC
  Administered 2019-12-21 (×6): 10 meq via INTRAVENOUS
  Filled 2019-12-21 (×4): qty 100

## 2019-12-21 MED ORDER — FAMOTIDINE 20 MG PO TABS: 40.0000 mg | ORAL_TABLET | Freq: Every day | ORAL | Status: DC

## 2019-12-21 MED ORDER — PANTOPRAZOLE SODIUM 40 MG IV SOLR
40.0000 mg | Freq: Two times a day (BID) | INTRAVENOUS | Status: DC
  Administered 2019-12-21 – 2019-12-25 (×9): 40 mg via INTRAVENOUS
  Filled 2019-12-21 (×9): qty 40

## 2019-12-21 MED ORDER — FLUTICASONE PROPIONATE 50 MCG/ACT NA SUSP
2.0000 | Freq: Every day | NASAL | Status: DC
  Administered 2019-12-21 – 2019-12-27 (×6): 2 via NASAL
  Filled 2019-12-21: qty 16

## 2019-12-21 MED ORDER — PANTOPRAZOLE SODIUM 40 MG PO TBEC: 40.0000 mg | DELAYED_RELEASE_TABLET | Freq: Every day | ORAL | Status: DC

## 2019-12-21 MED ORDER — ONDANSETRON HCL 4 MG/2ML IJ SOLN: 4.0000 mg | Freq: Four times a day (QID) | INTRAMUSCULAR | Status: DC | PRN

## 2019-12-21 MED ORDER — AMOXICILLIN-POT CLAVULANATE 875-125 MG PO TABS
1.0000 | ORAL_TABLET | Freq: Two times a day (BID) | ORAL | Status: DC
  Administered 2019-12-21: 1 via ORAL
  Filled 2019-12-21: qty 1

## 2019-12-21 MED ORDER — GUAIFENESIN ER 600 MG PO TB12
600.0000 mg | ORAL_TABLET | Freq: Two times a day (BID) | ORAL | Status: DC
  Administered 2019-12-21 – 2019-12-27 (×13): 600 mg via ORAL
  Filled 2019-12-21 (×13): qty 1

## 2019-12-21 MED ORDER — FLUCONAZOLE IN SODIUM CHLORIDE 400-0.9 MG/200ML-% IV SOLN
400.0000 mg | Freq: Once | INTRAVENOUS | Status: AC
  Administered 2019-12-21: 400 mg via INTRAVENOUS
  Filled 2019-12-21: qty 200

## 2019-12-21 MED ORDER — LIDOCAINE VISCOUS HCL 2 % MT SOLN
15.0000 mL | OROMUCOSAL | Status: DC | PRN
  Filled 2019-12-21: qty 15

## 2019-12-21 MED ORDER — MORPHINE SULFATE (PF) 2 MG/ML IV SOLN
2.0000 mg | INTRAVENOUS | Status: DC | PRN
  Administered 2019-12-21: 2 mg via INTRAVENOUS
  Filled 2019-12-21: qty 2

## 2019-12-21 MED ORDER — CLOPIDOGREL BISULFATE 75 MG PO TABS: 75.0000 mg | ORAL_TABLET | Freq: Every day | ORAL | Status: DC

## 2019-12-21 MED ORDER — MORPHINE SULFATE (PF) 2 MG/ML IV SOLN: 2.0000 mg | INTRAVENOUS | Status: DC | PRN

## 2019-12-21 MED ORDER — ALBUTEROL SULFATE (2.5 MG/3ML) 0.083% IN NEBU: 2.5000 mg | INHALATION_SOLUTION | Freq: Four times a day (QID) | RESPIRATORY_TRACT | Status: DC | PRN

## 2019-12-21 MED ORDER — HYDROCORTISONE 1 % EX CREA
TOPICAL_CREAM | Freq: Two times a day (BID) | CUTANEOUS | Status: DC
  Administered 2019-12-21 – 2019-12-27 (×3): 1 via TOPICAL
  Filled 2019-12-21: qty 28

## 2019-12-21 MED ORDER — ENOXAPARIN SODIUM 40 MG/0.4ML ~~LOC~~ SOLN
40.0000 mg | SUBCUTANEOUS | Status: DC
  Administered 2019-12-22 – 2019-12-24 (×3): 40 mg via SUBCUTANEOUS
  Filled 2019-12-21 (×4): qty 0.4

## 2019-12-21 MED ORDER — SUCRALFATE 1 GM/10ML PO SUSP
1.0000 g | Freq: Three times a day (TID) | ORAL | Status: DC
  Administered 2019-12-21 – 2019-12-27 (×24): 1 g via ORAL
  Filled 2019-12-21 (×25): qty 10

## 2019-12-21 MED ORDER — HYDROMORPHONE HCL 1 MG/ML IJ SOLN
1.0000 mg | INTRAMUSCULAR | Status: DC | PRN
  Administered 2019-12-22 – 2019-12-24 (×11): 1 mg via INTRAVENOUS
  Filled 2019-12-21 (×11): qty 1

## 2019-12-21 MED ORDER — LACTATED RINGERS IV SOLN: INTRAVENOUS | Status: DC

## 2019-12-21 MED ORDER — ACETAMINOPHEN 650 MG RE SUPP: 650.0000 mg | Freq: Four times a day (QID) | RECTAL | Status: DC | PRN

## 2019-12-21 MED ORDER — DEXTROSE IN LACTATED RINGERS 5 % IV SOLN: INTRAVENOUS | Status: DC

## 2019-12-21 MED ORDER — THIOTHIXENE 1 MG PO CAPS
2.0000 mg | ORAL_CAPSULE | Freq: Three times a day (TID) | ORAL | Status: DC
  Administered 2019-12-21 – 2019-12-27 (×20): 2 mg via ORAL
  Filled 2019-12-21 (×4): qty 2
  Filled 2019-12-21: qty 1
  Filled 2019-12-21 (×17): qty 2

## 2019-12-21 NOTE — Consult Note (Addendum)
Consultation  Referring Provider:  [facility]/ [facility] Primary Care Physician:  Wallie Char, FNP Primary Gastroenterologist:   unassigned.  Reason for Consultation: Severe dysphagia and odynophagia  HPI: Melvin Foley is a 74 y.o. male, who was admitted last evening through the emergency room after he presented with complaints of worsening chest pain and inability to eat or drink over the past week. Patient has history of right lower lobe lung adenocarcinoma initially diagnosed in May 2020.  He was treated at that time with chemo and radiation, does not recall having any difficulty with dysphagia at that time.  He does not think he has had prior EGD.  He has been receiving all of his care at [facility]. He had a local recurrence diagnosed within the past few months, and his been on chemotherapy and has just completed 30 rounds of radiation on 12/13/2019.  Patient says that he has been having progressive difficulty with dysphagia and odynophagia over the past 2 to 3 weeks.  Per radiation oncology notes that had him on a regimen of Pepcid and PPI as well as viscous lidocaine.  He is a difficult historian but it sounds as if he has not been able to take much at all p.o. over the past week.  It sounds as if he has been able to get some pills down but not all of them.  He says he hurts constantly whether he is trying to eat or not and points to his mid chest.  He had very recent hospitalization here 8/28 through 12/17/2019 with neutropenic fever.  When he presented last night he was still complaining of intermittent fevers and cough and had been placed on Augmentin. CT angio of the chest on admission shows decrease in size of masslike consolidation in the right lower lobe measuring 3.9 x 3.3 cm and slight decrease in mediastinal adenopathy. On admit lactate 2.8, WBC 3.4, hemoglobin 9.2/hematocrit 27.6/platelets 307. COVID-19 negative, blood cultures pending, calcium 3.3, creatinine 1.01, LFTs within  normal limits  Patient has history of sleep apnea, schizophrenia, bipolar disorder, prior CVA, right carotid endarterectomy in 2019 and bipolar disorder.  He has been maintained on Plavix.  He thinks it has been a few days since he took Plavix.   Past Medical History:  Diagnosis Date  . Anxiety    takes medication  . Arthritis   . Bipolar 1 disorder ([place])   . Bronchitis   . Cancer ([place])   . Carotid stenosis, left   . GERD (gastroesophageal reflux disease)   . H/O hiatal hernia   . Hypertension   . Shortness of breath   . Sleep apnea    2 YEARS  [place]   . Stroke [facility])     Past Surgical History:  Procedure Laterality Date  . CHOLECYSTECTOMY    . ENDARTERECTOMY Left 05/03/2017   Procedure: ENDARTERECTOMY CAROTID LEFT;  Surgeon: Rosetta Posner, MD;  Location: [place];  Service: Vascular;  Laterality: Left;  . FRACTURE SURGERY     right ankle  . head injury    . KNEE SURGERY     x2  . ORIF SHOULDER FRACTURE  03/03/2011   Procedure: OPEN REDUCTION INTERNAL FIXATION (ORIF) SHOULDER FRACTURE;  Surgeon: Augustin Schooling;  Location: [place];  Service: Orthopedics;  Laterality: Left;  LEFT PROXIMAL HUMERUS Open Reduction internal fixation  . SHOULDER ARTHROSCOPY WITH SUBACROMIAL DECOMPRESSION AND BICEP TENDON REPAIR  02/24/2012   Procedure: SHOULDER ARTHROSCOPY WITH SUBACROMIAL DECOMPRESSION AND BICEP TENDON  REPAIR;  Surgeon: Augustin Schooling, MD;  Location: [place];  Service: Orthopedics;  Laterality: Left;  with capsular release and lysis of adhesions  . TONSILLECTOMY      Prior to Admission medications   Medication Sig Start Date End Date Taking? Authorizing Provider  albuterol (VENTOLIN HFA) 108 (90 Base) MCG/ACT inhaler Inhale 1-2 puffs into the lungs every 6 (six) hours as needed for wheezing or shortness of breath.    [provider]  amoxicillin-clavulanate (AUGMENTIN) 875-125 MG tablet Take 1 tablet by mouth every 12 (twelve) hours. 12/17/19   Nita Sells, MD    clopidogrel (PLAVIX) 75 MG tablet Take 1 tablet (75 mg total) by mouth at bedtime. 05/01/17   Geradine Girt, DO  famotidine (PEPCID) 40 MG tablet Take 40 mg by mouth at bedtime.  06/30/19   [provider]  fluticasone (FLONASE) 50 MCG/ACT nasal spray Place 2 sprays into both nostrils daily.  01/08/19   [provider]  guaiFENesin (MUCINEX) 600 MG 12 hr tablet Take 600 mg by mouth 2 (two) times daily.  08/06/19   [provider]  hydrocortisone cream 1 % Apply topically 2 (two) times daily. 12/17/19   Nita Sells, MD  omeprazole (PRILOSEC) 20 MG capsule Take 20 mg by mouth at bedtime.     [provider]  sildenafil (VIAGRA) 100 MG tablet Take 100 mg by mouth as needed for erectile dysfunction.  04/04/19   [provider]  thiothixene (NAVANE) 2 MG capsule Take 2 mg by mouth 3 (three) times daily.     [provider]    Current Facility-Administered Medications  Medication Dose Route Frequency Provider Last Rate Last Admin  . acetaminophen (TYLENOL) tablet 650 mg  650 mg Oral Q6H PRN Etta Quill, DO       Or  . acetaminophen (TYLENOL) suppository 650 mg  650 mg Rectal Q6H PRN Etta Quill, DO      . albuterol (PROVENTIL) (2.5 MG/3ML) 0.083% nebulizer solution 2.5 mg  2.5 mg Inhalation Q6H PRN Etta Quill, DO      . amoxicillin-clavulanate (AUGMENTIN) 875-125 MG per tablet 1 tablet  1 tablet Oral Q12H Jennette Kettle M, DO      . clopidogrel (PLAVIX) tablet 75 mg  75 mg Oral QHS Jennette Kettle M, DO      . enoxaparin (LOVENOX) injection 40 mg  40 mg Subcutaneous Q24H Alcario Drought, Jared M, DO      . famotidine (PEPCID) tablet 40 mg  40 mg Oral QHS Jennette Kettle M, DO      . fluticasone [facility]) 50 MCG/ACT nasal spray 2 spray  2 spray Each Nare Daily Etta Quill, DO      . guaiFENesin (MUCINEX) 12 hr tablet 600 mg  600 mg Oral BID Jennette Kettle M, DO      . hydrocortisone cream 1 %   Topical BID Etta Quill, DO       . lactated ringers infusion   Intravenous Continuous Etta Quill, DO 100 mL/hr at 12/21/19 0432 New Bag at 12/21/19 0432  . lidocaine (XYLOCAINE) 2 % viscous mouth solution 15 mL  15 mL Mouth/Throat Q4H PRN Etta Quill, DO      . morphine 2 MG/ML injection 2-4 mg  2-4 mg Intravenous Q4H PRN Etta Quill, DO   2 mg at 12/21/19 0903  . ondansetron (ZOFRAN) tablet 4 mg  4 mg Oral Q6H PRN Etta Quill, DO  Or  . ondansetron (ZOFRAN) injection 4 mg  4 mg Intravenous Q6H PRN Etta Quill, DO      . pantoprazole (PROTONIX) EC tablet 40 mg  40 mg Oral QHS Jennette Kettle M, DO      . sucralfate (CARAFATE) 1 GM/10ML suspension 1 g  1 g Oral TID WC & HS Etta Quill, DO      . thiothixene (NAVANE) capsule 2 mg  2 mg Oral TID Etta Quill, DO        Allergies as of 12/20/2019  . (No Known Allergies)    Family History  Problem Relation Age of Onset  . Congestive Heart Failure Mother   . Stroke Mother   . Congestive Heart Failure Father     Social History   Socioeconomic History  . Marital status: Married    Spouse name: Not on file  . Number of children: Not on file  . Years of education: Not on file  . Highest education level: Not on file  Occupational History  . Not on file  Tobacco Use  . Smoking status: Former Smoker    Packs/day: 1.50  . Smokeless tobacco: Never Used  . Tobacco comment: stopped smoking 20 years ago  Vaping Use  . Vaping Use: Never used  Substance and Sexual Activity  . Alcohol use: No    Comment: past 20 years ago  . Drug use: Not Currently    Types: Marijuana, LSD  . Sexual activity: Not on file  Other Topics Concern  . Not on file  Social History Narrative  . Not on file   Social Determinants of Health   Financial Resource Strain:   . Difficulty of Paying Living Expenses: Not on file  Food Insecurity:   . Worried About Charity fundraiser in the Last Year: Not on file  . Ran Out of Food in the Last Year: Not on  file  Transportation Needs:   . Lack of Transportation (Medical): Not on file  . Lack of Transportation (Non-Medical): Not on file  Physical Activity:   . Days of Exercise per Week: Not on file  . Minutes of Exercise per Session: Not on file  Stress:   . Feeling of Stress : Not on file  Social Connections:   . Frequency of Communication with Friends and Family: Not on file  . Frequency of Social Gatherings with Friends and Family: Not on file  . Attends Religious Services: Not on file  . Active Member of Clubs or Organizations: Not on file  . Attends Archivist Meetings: Not on file  . Marital Status: Not on file  Intimate Partner Violence:   . Fear of Current or Ex-Partner: Not on file  . Emotionally Abused: Not on file  . Physically Abused: Not on file  . Sexually Abused: Not on file    Review of Systems: Pertinent positive and negative review of systems were noted in the above HPI section.  All other review of systems was otherwise negative.  Physical Exam: Vital signs in last 24 hours: Temp:  [99.7 F (37.6 C)-100.1 F (37.8 C)] 99.7 F (37.6 C) (09/04 0351) Pulse Rate:  [60-111] 77 (09/04 0351) Resp:  [9-28] 16 (09/04 0351) BP: (95-147)/(53-112) 95/53 (09/04 0351) SpO2:  [91 %-100 %] 97 % (09/04 0351) Weight:  [68.9 kg] 68.9 kg (09/03 1348)   General:   Alert,  Well-developed, older somewhat chronically ill-appearing white male, pleasant and cooperative in NAD.  Uncomfortable  and irritable Head:  Normocephalic and atraumatic. Eyes:  Sclera clear, no icterus.   Conjunctiva pink. Ears:  Normal auditory acuity. Nose:  No deformity, discharge,  or lesions. Mouth:  No deformity or lesions.   Neck:  Supple; no masses or thyromegaly. Lungs:  Clear throughout to auscultation.   No wheezes, crackles, or rhonchi.  Heart:  Regular rate and rhythm; no murmurs, clicks, rubs,  or gallops. Abdomen:  Soft,nontender, BS active,nonpalp mass or hsm.   Rectal:  Deferred    Msk:  Symmetrical without gross deformities. . Pulses:  Normal pulses noted. Extremities:  Without clubbing or edema. Neurologic:  Alert and  oriented x4;  grossly normal neurologically. Skin:  Intact without significant lesions or rashes.. Psych:  Alert and cooperative. Normal mood and affect.  Intake/Output from previous day: 09/03 0701 - 09/04 0700 In: 1202 [P.O.:200; IV Piggyback:1002] Out: -  Intake/Output this shift: Total I/O In: -  Out: 200 [Urine:200]  Lab Results: Recent Labs    12/20/19 1406 12/21/19 0747  WBC 3.4* 3.0*  HGB 9.2* 7.3*  HCT 27.6* 22.4*  PLT 307 239   BMET Recent Labs    12/20/19 1406 12/21/19 0747  NA 134* 133*  K 3.3* 3.3*  CL 99 102  CO2 25 23  GLUCOSE 109* 93  BUN 12 9  CREATININE 1.01 0.94  CALCIUM 8.3* 7.6*   LFT Recent Labs    12/20/19 1406  PROT 7.0  ALBUMIN 3.4*  AST 26  ALT 12  ALKPHOS 46  BILITOT 1.1   PT/INR Recent Labs    12/20/19 1406  LABPROT 14.4  INR 1.2     IMPRESSION:  #3 74 year old white male with recurrence of right lower lobe lung adenocarcinoma, metastatic with mediastinal adenopathy who has been undergoing immunotherapy and radiation through [facility]. He just completed 30 sessions of radiation on 12/13/2019. Admitted last night with complaints of worsening dysphagia and odynophagia with inability to take p.o.'s over the past several days.  CT angio of the chest last evening does not show any abnormality of the esophagus.  There is decrease in size of the masslike consolidation in the right lower lobe with residual mass measuring 4 x 3.3 cm, and slight decrease in mediastinal adenopathy.  He certainly has acute radiation-induced esophagitis.  #2 fevers, cough, elevated lactate-rule out superimposed pneumonia, bacteremia  #3 very recent admission here with neutropenic fever discharged 12/17/2019  #4 COPD #5  Sleep apnea #6  Prior history of CVA and right carotid endarterectomy maintained on  chronic Plavix #7  Schizophrenia and bipolar disorder  Plan:  No plan for EGD immediately as it is unlikely to change initial management Clear liquid diet and supplements as tolerated IV PPI twice daily Carafate suspension 4 times daily Viscous lidocaine 5 cc every 3 hours as needed Please offer IV analgesic, may benefit from scheduled dosing over the next few days Hold Plavix Hopefully symptoms will improve enough to allow him to at least take liquids and supplements though this may take several days.    We will follow with you  Amy EsterwoodPA-C  12/21/2019, 9:04 AM      Attending Physician Note   I have taken a history, examined the patient and reviewed the chart. I agree with the Advanced Practitioner's note, impression and recommendations.  Dysphagia and odynophagia from radiation esophagitis in a patient undergoing immunotherapy and radiation therapy through [facility]. No esophageal abnormalities noted on chest CTA.   EGD is unlikely to change mgmt at this  time  Treat for radiation esophagitis, reflux esophagitis and if GI symptoms fails to improve will proceed with EGD  Hold Plavix for now in case EGD with biopsies is needed.  Pantoprazole 40 mg bid Carafate suspension 1 g qid Viscous lidocaine q3h prn Pain mgmt Discussed benefits to his overall health care to coordinate hospital care where his oncologist has privileges   Lucio Edward, MD [facility]enter[facility]

## 2019-12-21 NOTE — H&P (Signed)
History and Physical    Melvin Foley ACZ:[id number] DOB: [birth year] DOA: 12/20/2019  PCP: Wallie Char, FNP  Patient coming from: Home  I have personally briefly reviewed patient's old medical records in [place]  Chief Complaint: CP, pain swallowing  HPI: Melvin Foley is a 74 y.o. male with medical history significant of NSCLC (adenocarcinoma of RLL of lung, currently on chemo and radiation), HTN, OSA, schizophrenia, COPD, BPD, L CEA 2019, prior stroke on plavix.  Pt recently admitted to hospital from 8/28 to 8/31 with neutropenic fever, cough.  Also had radiation esophagitis symptoms at that time.  Neutropenia improved with granix, pt discharged on augmentin, also on carafate to try and tolerate some POs.  Since discharge has had some fevers at home, ongoing cough, but biggest complaint remains CP after swallowing.  Even drinking liquids makes things significantly worse.  Came to ER because he couldn't tolerate this anymore.   ED Course: WBC 3.4k stable from discharge.  Lactate 2.8, improved to 1.7 after 1L bolus.  Pt transferred for admission.   Review of Systems: As per HPI, otherwise all review of systems negative.  Past Medical History:  Diagnosis Date  . Anxiety    takes medication  . Arthritis   . Bipolar 1 disorder ([place])   . Bronchitis   . Cancer ([place])   . Carotid stenosis, left   . GERD (gastroesophageal reflux disease)   . H/O hiatal hernia   . Hypertension   . Shortness of breath   . Sleep apnea    2 YEARS  [place]   . Stroke [facility])     Past Surgical History:  Procedure Laterality Date  . CHOLECYSTECTOMY    . ENDARTERECTOMY Left 05/03/2017   Procedure: ENDARTERECTOMY CAROTID LEFT;  Surgeon: Rosetta Posner, MD;  Location: [place];  Service: Vascular;  Laterality: Left;  . FRACTURE SURGERY     right ankle  . head injury    . KNEE SURGERY     x2  . ORIF SHOULDER FRACTURE  03/03/2011   Procedure: OPEN REDUCTION INTERNAL  FIXATION (ORIF) SHOULDER FRACTURE;  Surgeon: Augustin Schooling;  Location: [place];  Service: Orthopedics;  Laterality: Left;  LEFT PROXIMAL HUMERUS Open Reduction internal fixation  . SHOULDER ARTHROSCOPY WITH SUBACROMIAL DECOMPRESSION AND BICEP TENDON REPAIR  02/24/2012   Procedure: SHOULDER ARTHROSCOPY WITH SUBACROMIAL DECOMPRESSION AND BICEP TENDON REPAIR;  Surgeon: Augustin Schooling, MD;  Location: [place];  Service: Orthopedics;  Laterality: Left;  with capsular release and lysis of adhesions  . TONSILLECTOMY       reports that he has quit smoking. He smoked 1.50 packs per day. He has never used smokeless tobacco. He reports previous drug use. Drugs: Marijuana and LSD. He reports that he does not drink alcohol.  No Known Allergies  Family History  Problem Relation Age of Onset  . Congestive Heart Failure Mother   . Stroke Mother   . Congestive Heart Failure Father      Prior to Admission medications   Medication Sig Start Date End Date Taking? Authorizing Provider  albuterol (VENTOLIN HFA) 108 (90 Base) MCG/ACT inhaler Inhale 1-2 puffs into the lungs every 6 (six) hours as needed for wheezing or shortness of breath.    [provider]  amoxicillin-clavulanate (AUGMENTIN) 875-125 MG tablet Take 1 tablet by mouth every 12 (twelve) hours. 12/17/19   Nita Sells, MD  clopidogrel (PLAVIX) 75 MG tablet Take 1 tablet (75 mg total) by mouth at  bedtime. 05/01/17   Geradine Girt, DO  famotidine (PEPCID) 40 MG tablet Take 40 mg by mouth at bedtime.  06/30/19   [provider]  fluticasone (FLONASE) 50 MCG/ACT nasal spray Place 2 sprays into both nostrils daily.  01/08/19   [provider]  guaiFENesin (MUCINEX) 600 MG 12 hr tablet Take 600 mg by mouth 2 (two) times daily.  08/06/19   [provider]  hydrocortisone cream 1 % Apply topically 2 (two) times daily. 12/17/19   Nita Sells, MD  omeprazole (PRILOSEC) 20 MG capsule Take 20 mg by mouth at  bedtime.     [provider]  sildenafil (VIAGRA) 100 MG tablet Take 100 mg by mouth as needed for erectile dysfunction.  04/04/19   [provider]  thiothixene (NAVANE) 2 MG capsule Take 2 mg by mouth 3 (three) times daily.     [provider]    Physical Exam: Vitals:   12/21/19 0230 12/21/19 0245 12/21/19 0300 12/21/19 0351  BP: 109/72  111/69 (!) 95/53  Pulse: 79 79 81 77  Resp:    16  Temp: 100.1 F (37.8 C)   99.7 F (37.6 C)  TempSrc: Oral   Oral  SpO2: 91% 92% 91% 97%  Weight:      Height:        Constitutional: NAD, calm, comfortable Eyes: PERRL, lids and conjunctivae normal ENMT: Mucous membranes are moist. Posterior pharynx clear of any exudate or lesions.Normal dentition.  Neck: normal, supple, no masses, no thyromegaly Respiratory: clear to auscultation bilaterally, no wheezing, no crackles. Normal respiratory effort. No accessory muscle use.  Cardiovascular: Regular rate and rhythm, no murmurs / rubs / gallops. No extremity edema. 2+ pedal pulses. No carotid bruits.  Abdomen: no tenderness, no masses palpated. No hepatosplenomegaly. Bowel sounds positive.  Musculoskeletal: no clubbing / cyanosis. No joint deformity upper and lower extremities. Good ROM, no contractures. Normal muscle tone.  Skin: no rashes, lesions, ulcers. No induration Neurologic: CN 2-12 grossly intact. Sensation intact, DTR normal. Strength 5/5 in all 4.  Psychiatric: Normal judgment and insight. Alert and oriented x 3. Normal mood.    Labs on Admission: I have personally reviewed following labs and imaging studies  CBC: Recent Labs  Lab 12/14/19 1117 12/15/19 0538 12/16/19 0824 12/17/19 0531 12/20/19 1406  WBC 1.2* 1.1* 3.2* 3.4* 3.4*  NEUTROABS 1.0* 0.8* 2.6 2.4 1.7  HGB 10.1* 8.5* 8.6* 9.3* 9.2*  HCT 29.6* 24.9* 25.7* 27.0* 27.6*  MCV 89.2 88.3 89.5 88.8 92.0  PLT 171 162 174 204 956   Basic Metabolic Panel: Recent Labs  Lab 12/14/19 1117  12/15/19 0538 12/16/19 0824 12/17/19 0531 12/20/19 1406 12/20/19 1433  NA 140 136 137 135 134*  --   K 3.7 3.6 3.4* 3.2* 3.3*  --   CL 105 105 106 102 99  --   CO2 25 22 22 23 25   --   GLUCOSE 117* 97 96 93 109*  --   BUN 17 14 12 11 12   --   CREATININE 0.93 0.81 0.88 0.84 1.01  --   CALCIUM 8.5* 8.1* 7.9* 8.4* 8.3*  --   MG 1.5*  --  1.5*  --   --  1.4*   GFR: Estimated Creatinine Clearance: 62.1 mL/min (by C-G formula based on SCr of 1.01 mg/dL). Liver Function Tests: Recent Labs  Lab 12/14/19 1117 12/15/19 0538 12/16/19 0824 12/17/19 0531 12/20/19 1406  AST 16 15 19 18 26   ALT 13 10  13 13 12   ALKPHOS 48 40 38 41 46  BILITOT 1.2 1.0 1.0 1.1 1.1  PROT 6.6 5.8* 5.8* 6.2* 7.0  ALBUMIN 3.4* 3.0* 2.9* 3.1* 3.4*   No results for input(s): LIPASE, AMYLASE in the last 168 hours. No results for input(s): AMMONIA in the last 168 hours. Coagulation Profile: Recent Labs  Lab 12/15/19 0538 12/20/19 1406  INR 1.1 1.2   Cardiac Enzymes: No results for input(s): CKTOTAL, CKMB, CKMBINDEX, TROPONINI in the last 168 hours. BNP (last 3 results) No results for input(s): PROBNP in the last 8760 hours. HbA1C: No results for input(s): HGBA1C in the last 72 hours. CBG: No results for input(s): GLUCAP in the last 168 hours. Lipid Profile: No results for input(s): CHOL, HDL, LDLCALC, TRIG, CHOLHDL, LDLDIRECT in the last 72 hours. Thyroid Function Tests: No results for input(s): TSH, T4TOTAL, FREET4, T3FREE, THYROIDAB in the last 72 hours. Anemia Panel: No results for input(s): VITAMINB12, FOLATE, FERRITIN, TIBC, IRON, RETICCTPCT in the last 72 hours. Urine analysis:    Component Value Date/Time   COLORURINE YELLOW 12/20/2019 1406   APPEARANCEUR CLEAR 12/20/2019 1406   LABSPEC 1.020 12/20/2019 1406   PHURINE 6.0 12/20/2019 1406   GLUCOSEU NEGATIVE 12/20/2019 1406   HGBUR NEGATIVE 12/20/2019 1406   BILIRUBINUR NEGATIVE 12/20/2019 1406   KETONESUR 15 (A) 12/20/2019 1406    PROTEINUR NEGATIVE 12/20/2019 1406   NITRITE NEGATIVE 12/20/2019 1406   LEUKOCYTESUR NEGATIVE 12/20/2019 1406    Radiological Exams on Admission: CT Angio Chest PE W and/or Wo Contrast  Result Date: 12/20/2019 CLINICAL DATA:  PE suspected, high prob.  Lung cancer. EXAM: CT ANGIOGRAPHY CHEST WITH CONTRAST TECHNIQUE: Multidetector CT imaging of the chest was performed using the standard protocol during bolus administration of intravenous contrast. Multiplanar CT image reconstructions and MIPs were obtained to evaluate the vascular anatomy. CONTRAST:  135mL OMNIPAQUE IOHEXOL 350 MG/ML SOLN COMPARISON:  Radiograph earlier today.  Chest CTA 08/29/2019 FINDINGS: Cardiovascular: There are no filling defects within the pulmonary arteries to suggest pulmonary embolus. Again seen truncation of the right lower lobe pulmonary arteries in the region of masslike opacity. Aortic atherosclerosis without aneurysm or dissection. Left vertebral artery arises directly from the thoracic aorta. Heart is normal in size. No pericardial effusion. There are coronary artery calcifications. Mediastinum/Nodes: Stable 10 mm right hilar lymph node. Decreased size of lower paratracheal node measuring 9 mm, previously 12 mm. No new hilar or mediastinal adenopathy. No esophageal wall thickening. No visualized thyroid nodule. Lungs/Pleura: Decreased size of masslike consolidation in the right lower lobe with residual mass measuring approximately 3.9 x 3.3 cm, measured on series 6, image 50. Adjacent irregular atelectasis. Small right pleural effusion and pleural thickening. The adjacent 1 cm nodule in the medial right lung base on prior is no longer seen. Subpleural reticulation in the anterior right upper lobe series 6, image 37, with decreased soft tissue component from prior exam. Underlying emphysema and subpleural reticulation/fibrosis, unchanged. No findings of pulmonary edema. Upper Abdomen: No acute findings.  Post cholecystectomy.  Musculoskeletal: There are no acute or suspicious osseous abnormalities. Bones are under mineralized. Degenerative change in the spine. Prior surgical fixation of the left proximal humerus. Review of the MIP images confirms the above findings. IMPRESSION: 1. No pulmonary embolus. 2. Decreased size of masslike consolidation in the right lower lobe with residual mass measuring approximately 3.9 x 3.3 cm. The adjacent 1 cm nodule in the medial right lung base on prior is no longer seen. Small right pleural effusion and  pleural thickening. 3. Decreased soft tissue density of the subpleural reticulation in the right upper lobe from prior. 4. Slight decreased size of mediastinal lymph nodes. Aortic Atherosclerosis (ICD10-I70.0) and Emphysema (ICD10-J43.9). Electronically Signed   By: Keith Rake M.D.   On: 12/20/2019 18:13   DG Chest Port 1 View  Result Date: 12/20/2019 CLINICAL DATA:  Shortness of breath.  Lung cancer. EXAM: PORTABLE CHEST 1 VIEW COMPARISON:  Chest radiograph 12/14/2019.  CT 08/29/2019 FINDINGS: The heart size and mediastinal contours are within normal limits. Similar appearance of masslike consolidation in the right lung base, compatible with the patient's known malignancy. Similar reticulation in the left lung base. Right costophrenic opacity likely represents pleural scarring when correlating with prior chest CT. No left pleural effusion. No pneumothorax. No acute osseous abnormality. Partially imaged ORIF of the left humerus. Cholecystectomy clips. IMPRESSION: 1. Similar appearance of masslike consolidation in the right lung base, compatible with the patient's known malignancy. 2. Similar reticulation in the left lung base, which may represent mild edema, infection, or post treatment change if the patient received radiation to this area Electronically Signed   By: Margaretha Sheffield MD   On: 12/20/2019 14:17    EKG: Independently reviewed.  Assessment/Plan Principal Problem:   Radiation  esophagitis Active Problems:   Carotid stenosis   Fever   Primary adenocarcinoma of lower lobe of right lung (HCC)   Bipolar disorder ([place])    1. Radiation esophagitis - 1. Continue carafate and viscous lidocaine for the moment 2. Clear liquids if he can tolerate at all 3. Morphine PRN pain for now, may be that we have to go to opiates to control pain with eating? 4. Getting GI consult for AM, sent secure message to Dr. Fuller Plan.  Will see if they have anything to offer. 5. Obviously the last resort (and one that patient is understandably not keen on) would be PEG tube. 2. Fever - 1. Continuing augmentin for the moment 2. BCx pending 3. No source identified 4. Defer to day team if they want to escalate ABx or not. 5. Neutropenia from last admit remains resolved 3. NSCLC RLL - 1. Currently on chemo and radiation 2. The one bright spot in all this, as I told patient, is that the CT scan clearly shows the tumor has shrunk, presumably in response to therapy. 1. Metastatic adenopathy also reduced 4. BPD / schizophrenia - 1. Cont thiothixene at the 2mg  TID dose that it was reduced to last admit (dose reduced due to causing granulocytopenia). 5. Carotid stenosis - 1. S/p CEA 2019 2. Cont plavix  DVT prophylaxis: Lovenox Code Status: DNR Family Communication: No family in room Disposition Plan: Home after pt able to tolerate POs Consults called: Message sent to Dr. Fuller Plan for GI consult in AM. Admission status: Admit to inpatient  Severity of Illness: The appropriate patient status for this patient is INPATIENT. Inpatient status is judged to be reasonable and necessary in order to provide the required intensity of service to ensure the patient's safety. The patient's presenting symptoms, physical exam findings, and initial radiographic and laboratory data in the context of their chronic comorbidities is felt to place them at high risk for further clinical deterioration. Furthermore, it is  not anticipated that the patient will be medically stable for discharge from the hospital within 2 midnights of admission. The following factors support the patient status of inpatient.   IP status as pt unable to tolerate POs at this time due to radiation esophagitis.   *  I certify that at the point of admission it is my clinical judgment that the patient will require inpatient hospital care spanning beyond 2 midnights from the point of admission due to high intensity of service, high risk for further deterioration and high frequency of surveillance required.*    Jaber Dunlow M. DO [facility]  How to contact the [facility] Attending or Consulting provider [place] or covering provider during after hours [place], for this patient?  1. Check the care team in [facility] and look for a) attending/consulting [facility] provider listed and b) the [facility] team listed 2. Log into [email or web address]  Amion Physician Scheduling and messaging for groups and whole hospitals  On call and physician scheduling software for group practices, residents, hospitalists and other medical providers for call, clinic, rotation and shift schedules. OnCall [employer] is a hospital-wide system for scheduling doctors and paging doctors on call. EasyPlot is for scientific plotting and data analysis.  [email or web address]  and use [place]'s universal password to access. If you do not have the password, please contact the hospital operator.  3. Locate the [facility] provider you are looking for under [facility] and page to a number that you can be directly reached. 4. If you still have difficulty reaching the provider, please page the [facility] (Director on Call) for the Hospitalists listed on amion for assistance.  12/21/2019, 5:12 AM

## 2019-12-21 NOTE — Progress Notes (Addendum)
PROGRESS NOTE    LETICIA MCDIARMID  XQJ:[id number] DOB: 11-Jan-1946 DOA: 12/20/2019 PCP: Wallie Char, FNP    Brief Narrative:  Patient was admitted to the hospital working diagnosis of radiation esophagitis.  74 year old male with past medical history for adenocarcinoma of the right lower lung, currently on chemotherapy and radiation therapy. He also has hypertension, COPD, schizophrenia and obstructive sleep apnea.  He had recent hospitalization 8/28-8/31st for neutropenic fever.  Patient received Granix with improvement of leukopenia and he was discharged on Augmentin.  At home patient developed persistent fever, ongoing cough and persistent difficulty swallowing due to severe odynophagia.  On his initial physical examination blood pressure 109/72, 95/53, heart rate 77, respiratory rate 16, temperature 38.8 C, oxygen saturation 91%, her lungs are clear to auscultation bilaterally, heart S1-S2, present rhythmic, soft abdomen, no lower extremity edema. Sodium 134, potassium 3.3, chloride 99, bicarb 25, glucose 109, BUN 12, creatinine 1.0, magnesium 1.4, AST 26, ALT 12, lactic acid 2.8, white count 3.4, hemoglobin 9.2, hematocrit 27.6, platelets 307.  SARS COVID-19 negative.  Urinalysis specific gravity 1.020, negative leukocytes negative nitrates. CT chest negative for pulmonary embolism, persistent right lower lobe mass, 3.9 x 3.3 cm.  Chest radiograph with right lower lobe mass.  EKG 89 bpm, left axis deviation with left anterior fascicular block, sinus rhythm, no ST segment or T wave changes.  Assessment & Plan:   Principal Problem:   Radiation esophagitis Active Problems:   Carotid stenosis   Fever   Primary adenocarcinoma of lower lobe of right lung (HCC)   Bipolar disorder ([place])   1. Radiation esophagitis. Patient continue with severe odynophagia, not able to tolerate clear liquids. He completed radiation therapy 7 days ago.   Will continue medical therapy with antiacid therapy  with sucralfate, pantoprazole and lidocaine viscus.  Change morphine to hydromorphone for better pain control and will change acetaminophen to scheduled. Add oxycodone as needed for moderate pain.   Add fluconazole in case of candida esophagitis, empiric treatment. Continue IV fluids with dextrose and balanced electrolyte solutions.   Continue with clear liquid diet. If medical management fails, patient will need diagnostic endoscopy per GI recommendations.   2. Recovering neutropenic fever. Patient has remained afebrile, wbc at 3,0 has been stable.  Will discontinue antibiotic therapy for now and will continue close monitoring.   3. Dehydration with hypokalemia/ hyponatremia/ hypomagensemia. Continue IV fluids with dextrose and balanced electrolyte solutions at 75 ml per H. Add Kcl IV 60 meq and follow up renal function and electrolytes in am, including Mg.   4. 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 Patient follows up at [facility], old records personally reviewed.  Consult nutrition.   Hgb is 7,3 and Hct at 22.4. Will continue close follow up, plan for PRBC transfusion when Hgb less than 7 or if signs of acute bleeding.   5. Schizophrenia. Stable with no decompensation, continue with Thiotixene.   Patient continue to be at high risk for worsening esophagitis and dehydration   Status is: Inpatient  Remains inpatient appropriate because:IV treatments appropriate due to intensity of illness or inability to take PO   Dispo: The patient is from: Home              Anticipated d/c is to: Home              Anticipated d/c date is: 3 days              Patient currently  is not medically stable to d/c.   DVT prophylaxis: Enoxaparin  Code Status:    dnr   Family Communication:  I spoke with patient's wife  at the bedside, we talked in detail about patient's condition, plan of care and prognosis and all questions were addressed.     Consultants:     GI   Subjective: Patient continue to have significant pain when swallowing, no nausea or vomiting, continue to feel very weak and deconditioned.   Objective: Vitals:   12/21/19 0245 12/21/19 0300 12/21/19 0351 12/21/19 0939  BP:  111/69 (!) 95/53 (!) 144/89  Pulse: 79 81 77 76  Resp:   16 16  Temp:   99.7 F (37.6 C) 98.6 F (37 C)  TempSrc:   Oral Oral  SpO2: 92% 91% 97% 98%  Weight:      Height:        Intake/Output Summary (Last 24 hours) at 12/21/2019 1053 Last data filed at 12/21/2019 0900 Gross per 24 hour  Intake 1352.01 ml  Output 200 ml  Net 1152.01 ml   Filed Weights   12/20/19 1348  Weight: 68.9 kg    Examination:   General: Not in pain or dyspnea, deconditioned  Neurology: Awake and alert, non focal  E ENT: mild pallor, no icterus, oral mucosa sry Cardiovascular: No JVD. S1-S2 present, rhythmic, no gallops, rubs, or murmurs. No lower extremity edema. Pulmonary: positive breath sounds bilaterally, adequate air movement, no wheezing, rhonchi or rales. Gastrointestinal. Abdomen soft and non tender Skin. No rashes Musculoskeletal: no joint deformities     Data Reviewed: I have personally reviewed following labs and imaging studies  CBC: Recent Labs  Lab 12/14/19 1117 12/14/19 1117 12/15/19 0538 12/16/19 0824 12/17/19 0531 12/20/19 1406 12/21/19 0747  WBC 1.2*   < > 1.1* 3.2* 3.4* 3.4* 3.0*  NEUTROABS 1.0*  --  0.8* 2.6 2.4 1.7  --   HGB 10.1*   < > 8.5* 8.6* 9.3* 9.2* 7.3*  HCT 29.6*   < > 24.9* 25.7* 27.0* 27.6* 22.4*  MCV 89.2   < > 88.3 89.5 88.8 92.0 91.1  PLT 171   < > 162 174 204 307 239   < > = values in this interval not displayed.   Basic Metabolic Panel: Recent Labs  Lab 12/14/19 1117 12/14/19 1117 12/15/19 0538 12/16/19 0824 12/17/19 0531 12/20/19 1406 12/20/19 1433 12/21/19 0747  NA 140   < > 136 137 135 134*  --  133*  K 3.7   < > 3.6 3.4* 3.2* 3.3*  --  3.3*  CL 105   < > 105 106 102 99  --  102  CO2 25   < > 22 22  23 25   --  23  GLUCOSE 117*   < > 97 96 93 109*  --  93  BUN 17   < > 14 12 11 12   --  9  CREATININE 0.93   < > 0.81 0.88 0.84 1.01  --  0.94  CALCIUM 8.5*   < > 8.1* 7.9* 8.4* 8.3*  --  7.6*  MG 1.5*  --   --  1.5*  --   --  1.4*  --    < > = values in this interval not displayed.   GFR: Estimated Creatinine Clearance: 66.7 mL/min (by C-G formula based on SCr of 0.94 mg/dL). Liver Function Tests: Recent Labs  Lab 12/14/19 1117 12/15/19 0538 12/16/19 0824 12/17/19 0531 12/20/19 1406  AST 16  15 19 18 26   ALT 13 10 13 13 12   ALKPHOS 48 40 38 41 46  BILITOT 1.2 1.0 1.0 1.1 1.1  PROT 6.6 5.8* 5.8* 6.2* 7.0  ALBUMIN 3.4* 3.0* 2.9* 3.1* 3.4*   No results for input(s): LIPASE, AMYLASE in the last 168 hours. No results for input(s): AMMONIA in the last 168 hours. Coagulation Profile: Recent Labs  Lab 12/15/19 0538 12/20/19 1406  INR 1.1 1.2   Cardiac Enzymes: No results for input(s): CKTOTAL, CKMB, CKMBINDEX, TROPONINI in the last 168 hours. BNP (last 3 results) No results for input(s): PROBNP in the last 8760 hours. HbA1C: No results for input(s): HGBA1C in the last 72 hours. CBG: No results for input(s): GLUCAP in the last 168 hours. Lipid Profile: No results for input(s): CHOL, HDL, LDLCALC, TRIG, CHOLHDL, LDLDIRECT in the last 72 hours. Thyroid Function Tests: No results for input(s): TSH, T4TOTAL, FREET4, T3FREE, THYROIDAB in the last 72 hours. Anemia Panel: No results for input(s): VITAMINB12, FOLATE, FERRITIN, TIBC, IRON, RETICCTPCT in the last 72 hours.    Radiology Studies: I have reviewed all of the imaging during this hospital visit personally     Scheduled Meds: . amoxicillin-clavulanate  1 tablet Oral Q12H  . enoxaparin (LOVENOX) injection  40 mg Subcutaneous Q24H  . famotidine  40 mg Oral QHS  . feeding supplement  1 Container Oral TID BM  . fluticasone  2 spray Each Nare Daily  . guaiFENesin  600 mg Oral BID  . hydrocortisone cream   Topical BID    . pantoprazole (PROTONIX) IV  40 mg Intravenous Q12H  . sucralfate  1 g Oral TID WC & HS  . thiothixene  2 mg Oral TID   Continuous Infusions: . lactated ringers 100 mL/hr at 12/21/19 0432     LOS: 0 days        Olivette Beckmann Gerome Apley, MD

## 2019-12-22 LAB — BASIC METABOLIC PANEL
Anion gap: 8 (ref 5–15)
BUN: 5 mg/dL — ABNORMAL LOW (ref 8–23)
CO2: 25 mmol/L (ref 22–32)
Calcium: 8.1 mg/dL — ABNORMAL LOW (ref 8.9–10.3)
Chloride: 106 mmol/L (ref 98–111)
Creatinine, Ser: 0.83 mg/dL (ref 0.61–1.24)
GFR calc Af Amer: >60 mL/min (ref >60)
GFR calc non Af Amer: >60 mL/min (ref >60)
Glucose, Bld: 107 mg/dL — ABNORMAL HIGH (ref 70–99)
Potassium: 3.5 mmol/L (ref 3.5–5.1)
Sodium: 139 mmol/L (ref 135–145)

## 2019-12-22 LAB — MAGNESIUM: Magnesium: 1.5 mg/dL — ABNORMAL LOW (ref 1.7–2.4)

## 2019-12-22 LAB — URINE CULTURE: Culture: NO GROWTH

## 2019-12-22 MED ORDER — MAGNESIUM SULFATE 2 GM/50ML IV SOLN
2.0000 g | Freq: Once | INTRAVENOUS | Status: AC
  Administered 2019-12-22: 2 g via INTRAVENOUS
  Filled 2019-12-22: qty 50

## 2019-12-22 NOTE — Progress Notes (Addendum)
Patient ID: Melvin Foley, male   DOB: 1945-09-14, 74 y.o.   MRN: [id number]    Progress Note   Subjective  Day # 2 CC: severe dysphagia and odynophagia - s/p radiation for lung CA - completed 12/13/19  Patient says he feels a bit better today, has been able to tolerate some clear liquids.  Pain about the same, he says that morphine somehow increased his chest discomfort and he did not want to stay on that medication.  He has not been using the viscous lidocaine regularly because he felt that it did not improve his situation while he was on it at home.    Objective   Vital signs in last 24 hours: Temp:  [98.2 F (36.8 C)-98.6 F (37 C)] 98.2 F (36.8 C) (09/05 0415) Pulse Rate:  [73-76] 75 (09/05 0415) Resp:  [16-17] 17 (09/05 0415) BP: (90-144)/(58-89) 100/75 (09/05 0415) SpO2:  [98 %-99 %] 98 % (09/05 0415)   General:  White male  in NAD Heart:  Regular rate and rhythm; no murmurs Lungs: Respirations even and unlabored, lungs CTA bilaterally Abdomen:  Soft, nontender and nondistended. Normal bowel sounds. Extremities:  Without edema. Neurologic:  Alert and oriented,  grossly normal neurologically. Psych:  Cooperative. Normal mood and affect.  Intake/Output from previous day: 09/04 0701 - 09/05 0700 In: 1337.1 [P.O.:750; IV Piggyback:587.1] Out: 880 [Urine:880] Intake/Output this shift: No intake/output data recorded.  Lab Results: Recent Labs    12/20/19 1406 12/21/19 0747 12/21/19 1457  WBC 3.4* 3.0* 3.3*  HGB 9.2* 7.3* 8.0*  HCT 27.6* 22.4* 23.8*  PLT 307 239 237   BMET Recent Labs    12/20/19 1406 12/21/19 0747 12/22/19 0203  NA 134* 133* 139  K 3.3* 3.3* 3.5  CL 99 102 106  CO2 25 23 25   GLUCOSE 109* 93 107*  BUN 12 9 5*  CREATININE 1.01 0.94 0.83  CALCIUM 8.3* 7.6* 8.1*   LFT Recent Labs    12/20/19 1406  PROT 7.0  ALBUMIN 3.4*  AST 26  ALT 12  ALKPHOS 46  BILITOT 1.1   PT/INR Recent Labs    12/20/19 1406  LABPROT 14.4  INR  1.2    Studies/Results: CT Angio Chest PE W and/or Wo Contrast  Result Date: 12/20/2019 CLINICAL DATA:  PE suspected, high prob.  Lung cancer. EXAM: CT ANGIOGRAPHY CHEST WITH CONTRAST TECHNIQUE: Multidetector CT imaging of the chest was performed using the standard protocol during bolus administration of intravenous contrast. Multiplanar CT image reconstructions and MIPs were obtained to evaluate the vascular anatomy. CONTRAST:  11mL OMNIPAQUE IOHEXOL 350 MG/ML SOLN COMPARISON:  Radiograph earlier today.  Chest CTA 08/29/2019 FINDINGS: Cardiovascular: There are no filling defects within the pulmonary arteries to suggest pulmonary embolus. Again seen truncation of the right lower lobe pulmonary arteries in the region of masslike opacity. Aortic atherosclerosis without aneurysm or dissection. Left vertebral artery arises directly from the thoracic aorta. Heart is normal in size. No pericardial effusion. There are coronary artery calcifications. Mediastinum/Nodes: Stable 10 mm right hilar lymph node. Decreased size of lower paratracheal node measuring 9 mm, previously 12 mm. No new hilar or mediastinal adenopathy. No esophageal wall thickening. No visualized thyroid nodule. Lungs/Pleura: Decreased size of masslike consolidation in the right lower lobe with residual mass measuring approximately 3.9 x 3.3 cm, measured on series 6, image 50. Adjacent irregular atelectasis. Small right pleural effusion and pleural thickening. The adjacent 1 cm nodule in the medial right lung base on prior is  no longer seen. Subpleural reticulation in the anterior right upper lobe series 6, image 37, with decreased soft tissue component from prior exam. Underlying emphysema and subpleural reticulation/fibrosis, unchanged. No findings of pulmonary edema. Upper Abdomen: No acute findings.  Post cholecystectomy. Musculoskeletal: There are no acute or suspicious osseous abnormalities. Bones are under mineralized. Degenerative change in  the spine. Prior surgical fixation of the left proximal humerus. Review of the MIP images confirms the above findings. IMPRESSION: 1. No pulmonary embolus. 2. Decreased size of masslike consolidation in the right lower lobe with residual mass measuring approximately 3.9 x 3.3 cm. The adjacent 1 cm nodule in the medial right lung base on prior is no longer seen. Small right pleural effusion and pleural thickening. 3. Decreased soft tissue density of the subpleural reticulation in the right upper lobe from prior. 4. Slight decreased size of mediastinal lymph nodes. Aortic Atherosclerosis (ICD10-I70.0) and Emphysema (ICD10-J43.9). Electronically Signed   By: Keith Rake M.D.   On: 12/20/2019 18:13   DG Chest Port 1 View  Result Date: 12/20/2019 CLINICAL DATA:  Shortness of breath.  Lung cancer. EXAM: PORTABLE CHEST 1 VIEW COMPARISON:  Chest radiograph 12/14/2019.  CT 08/29/2019 FINDINGS: The heart size and mediastinal contours are within normal limits. Similar appearance of masslike consolidation in the right lung base, compatible with the patient's known malignancy. Similar reticulation in the left lung base. Right costophrenic opacity likely represents pleural scarring when correlating with prior chest CT. No left pleural effusion. No pneumothorax. No acute osseous abnormality. Partially imaged ORIF of the left humerus. Cholecystectomy clips. IMPRESSION: 1. Similar appearance of masslike consolidation in the right lung base, compatible with the patient's known malignancy. 2. Similar reticulation in the left lung base, which may represent mild edema, infection, or post treatment change if the patient received radiation to this area Electronically Signed   By: Margaretha Sheffield MD   On: 12/20/2019 14:17       Assessment / Plan:    #74 74 year old white male admitted with severe dysphagia and odynophagia with inability to take p.o.'s.  This is in the setting of just having completed radiation for recurrent  adenocarcinoma of the lung.  Patient completed radiation 12/13/2019.  Progressive dysphagia and odynophagia over the past few weeks secondary to acute radiation esophagitis.  He is taking some clear liquids, and willing to try full liquids. Continue Carafate suspension 4 times daily Encourage patient to continue to use viscous lidocaine 30 minutes prior to meals as this will increase his ability to take p.o.'s. We discussed goal of being able to tolerate at least full liquids and supplements prior to discharge Continue IV PPI twice daily Oral oxycodone, has been added to his regimen, he feels that he can swallow tablets. Fluconazole added by hospitalist.  I am hopeful that he will be able to tolerate enough p.o.'s to avoid NG tube feedings or G-tube feeding.  #2 recent admission for neutropenic fever #3 electrolyte derangement-correcting #4 right lower lobe adenocarcinoma stage IIIb-oncologic and radiation therapy care has been through [facility].    LOS: 1 day   Amy Esterwood PA-C 12/22/2019, 8:37 AM     Attending Physician Note   I have taken an interval history, reviewed the chart and examined the patient. I agree with the Advanced Practitioner's note, impression and recommendations.   Dysphagia, odynophagia from radiation esophagitis is improving on current medication regimen. Covering for possible reflux esophagitis and candida esophagitis as well. Continue all current medications.   Advance to full  liquids with Ensure or similar supplements between meals. Can discharge patient on this diet if he tolerates. Advised to sit fully upright for all oral intake. We should be able to avoid NG tube feedings or G-tube feedings.  Lucio Edward, MD [facility]

## 2019-12-22 NOTE — Progress Notes (Signed)
Patient ID: Melvin Foley, male   DOB: 1945-09-25, 74 y.o.   MRN: [id number]  PROGRESS NOTE    Melvin Foley  Melvin Foley DOB: 1945/07/07 DOA: 12/20/2019 PCP: Wallie Char, FNP    Brief Narrative:  Melvin Foley is a 74 y.o. male with medical history significant of NSCLC (adenocarcinoma of RLL of lung, currently on chemo and radiation), HTN, OSA, schizophrenia, COPD, BPD, L CEA 2019, prior stroke on plavix.  Pt recently admitted to hospital from 8/28 to 8/31 with neutropenic fever, cough.  Also had radiation esophagitis symptoms at that time.  Neutropenia improved with granix, pt discharged on augmentin, also on carafate to try and tolerate some POs.  Since discharge has had some fevers at home, ongoing cough, but biggest complaint remains CP after swallowing.  Even drinking liquids makes things significantly worse.  Came to ER because he couldn't tolerate this anymore.   Assessment & Plan:   Principal Problem:   Radiation esophagitis Active Problems:   Carotid stenosis   Fever   Primary adenocarcinoma of lower lobe of right lung (HCC)   Bipolar disorder ([place])   1. Radiation esophagitis. Patient continue with severe odynophagia, not able to tolerate clear liquids. He completed radiation therapy 7 days ago.   Medical therapy with sucralfate, pantoprazole and lidocaine viscus.  Hydromorphone and scheduled Tylenol. Add oxycodone as needed for moderate pain.   Add fluconazole empiric treatment.   Continue IV fluids with dextrose and balanced electrolyte solutions.   Continue with clear liquid diet.  GI on board  2. Recovering neutropenic fever. Patient has remained afebrile, wbc at 3,0 has been stable.   3. Dehydration with hypokalemia/ hyponatremia/ hypomagensemia. Continue IV fluids with dextrose  Trend electrolytes  replete as needed  4. 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 Patient follows up at  [facility], old records personally reviewed.  Consult nutrition.   Hgb is 7,3 and Hct at 22.4. Will continue close follow up, plan for PRBC transfusion when Hgb less than 7 or if signs of acute bleeding.   5. Schizophrenia. Stable with no decompensation, continue with Thiotixene.   6. Carotid stenosis. Status post carotid endarterectomy in 2019  Continue Plavix  DVT prophylaxis: Lovenox SQ Code Status: DNR  Family Communication: Patient at bedside Disposition Plan: Home  Patient remains inpatient due to inability to tolerate p.o. and need for IV fluid hydration as well as electrolyte repletion.   Consultants:   Gastroenterology  Procedures:  None  Antimicrobials: Anti-infectives (From admission, onward)   Start     Dose/Rate Route Frequency Ordered Stop   12/22/19 1300  fluconazole (DIFLUCAN) IVPB 200 mg        200 mg 100 mL/hr over 60 Minutes Intravenous Every 24 hours 12/21/19 1218     12/21/19 1300  fluconazole (DIFLUCAN) IVPB 400 mg        400 mg 100 mL/hr over 120 Minutes Intravenous  Once 12/21/19 1218 12/21/19 1559   12/21/19 1000  amoxicillin-clavulanate (AUGMENTIN) 875-125 MG per tablet 1 tablet  Status:  Discontinued        1 tablet Oral Every 12 hours 12/21/19 0415 12/21/19 1218       Subjective: Reports ongoing pain with swallowing even liquids. Feels better with IV pain control.  Objective: Vitals:   12/21/19 1947 12/22/19 0415 12/22/19 1323 12/22/19 1346  BP: (!) 90/58 100/75 (!) 88/63 92/60  Pulse: 73 75 74   Resp: 17 17 16    Temp:  98.4 F (36.9 C) 98.2 F (36.8 C) 99.1 F (37.3 C)   TempSrc: Oral Oral Axillary   SpO2: 98% 98% 96%   Weight:      Height:        Intake/Output Summary (Last 24 hours) at 12/22/2019 1704 Last data filed at 12/22/2019 1325 Gross per 24 hour  Intake 568.03 ml  Output 1280 ml  Net -711.97 ml   Filed Weights   12/20/19 1348  Weight: 68.9 kg    Examination:  General exam: Appears calm and comfortable,  thin Respiratory system: Clear to auscultation. Respiratory effort normal. Cardiovascular system: S1 & S2 heard, RRR.  Gastrointestinal system: Abdomen is nondistended, soft and nontender.  Central nervous system: Alert and oriented. No focal neurological deficits. Extremities: Symmetric  Skin: No rashes Psychiatry: Judgement and insight appear normal. Mood & affect appropriate.     Data Reviewed: I have personally reviewed following labs and imaging studies  CBC: Recent Labs  Lab 12/16/19 0824 12/17/19 0531 12/20/19 1406 12/21/19 0747 12/21/19 1457  WBC 3.2* 3.4* 3.4* 3.0* 3.3*  NEUTROABS 2.6 2.4 1.7  --  2.4  HGB 8.6* 9.3* 9.2* 7.3* 8.0*  HCT 25.7* 27.0* 27.6* 22.4* 23.8*  MCV 89.5 88.8 92.0 91.1 91.2  PLT 174 204 307 239 170   Basic Metabolic Panel: Recent Labs  Lab 12/16/19 0824 12/17/19 0531 12/20/19 1406 12/20/19 1433 12/21/19 0747 12/22/19 0203  NA 137 135 134*  --  133* 139  K 3.4* 3.2* 3.3*  --  3.3* 3.5  CL 106 102 99  --  102 106  CO2 22 23 25   --  23 25  GLUCOSE 96 93 109*  --  93 107*  BUN 12 11 12   --  9 5*  CREATININE 0.88 0.84 1.01  --  0.94 0.83  CALCIUM 7.9* 8.4* 8.3*  --  7.6* 8.1*  MG 1.5*  --   --  1.4*  --  1.5*   GFR: Estimated Creatinine Clearance: 75.5 mL/min (by C-G formula based on SCr of 0.83 mg/dL). Liver Function Tests: Recent Labs  Lab 12/16/19 0824 12/17/19 0531 12/20/19 1406  AST 19 18 26   ALT 13 13 12   ALKPHOS 38 41 46  BILITOT 1.0 1.1 1.1  PROT 5.8* 6.2* 7.0  ALBUMIN 2.9* 3.1* 3.4*   Coagulation Profile: Recent Labs  Lab 12/20/19 1406  INR 1.2   Sepsis Labs: Recent Labs  Lab 12/20/19 1406 12/20/19 1546  LATICACIDVEN 2.8* 1.7    Recent Results (from the past 240 hour(s))  Blood culture (routine x 2)     Status: None   Collection Time: 12/14/19 11:12 AM   Specimen: BLOOD  Result Value Ref Range Status   Specimen Description   Final    BLOOD RIGHT ANTECUBITAL Performed at [facility], [place] [address], [place], [place] [address]    Special Requests   Final    BOTTLES DRAWN AEROBIC AND ANAEROBIC Blood Culture adequate volume Performed at [facility], [address] [place], [place] [address]    Culture   Final    NO GROWTH 5 DAYS Performed at [facility], [place] [address] [place], [place] [address]    Report Status 12/19/2019 FINAL  Final  Blood culture (routine x 2)     Status: None   Collection Time: 12/14/19 11:15 AM   Specimen: BLOOD  Result Value Ref Range Status   Specimen Description   Final  BLOOD LEFT ANTECUBITAL Performed at [facility], [place] [address] [place], [place] [address]    Special Requests   Final    BOTTLES DRAWN AEROBIC AND ANAEROBIC Blood Culture adequate volume Performed at [facility], [address] [place], [place] [address]    Culture   Final    NO GROWTH 5 DAYS Performed at [facility], [place] [address], [place], [place] [address]    Report Status 12/19/2019 FINAL  Final  SARS Coronavirus 2 by RT PCR (hospital order, performed in [facility] hospital lab) Nasopharyngeal     Status: None   Collection Time: 12/14/19 11:24 AM   Specimen: Nasopharyngeal  Result Value Ref Range Status   SARS Coronavirus 2 NEGATIVE NEGATIVE Final    Comment: (NOTE) SARS-CoV-2 target nucleic acids are NOT DETECTED.  The SARS-CoV-2 RNA is generally detectable in upper and lower respiratory specimens during the acute phase of infection. The lowest concentration of SARS-CoV-2 viral copies this assay can detect is 250 copies / mL. A negative result does not preclude SARS-CoV-2 infection and should not be used as the sole basis for treatment or other patient management decisions.  A negative result may occur with improper specimen collection / handling, submission of specimen other than nasopharyngeal swab, presence of viral mutation(s) within the areas targeted by this assay, and inadequate number of viral copies (<250  copies / mL). A negative result must be combined with clinical observations, patient history, and epidemiological information.  Fact Sheet for Patients:   StrictlyIdeas.no  Fact Sheet for Healthcare Providers: BankingDealers.co.za  This test is not yet approved or  cleared by the [place] FDA and has been authorized for detection and/or diagnosis of SARS-CoV-2 by FDA under an Emergency Use Authorization (EUA).  This EUA will remain in effect (meaning this test can be used) for the duration of the COVID-19 declaration under Section 564(b)(1) of the Act, 21 U.S.C. section 360bbb-3(b)(1), unless the authorization is terminated or revoked sooner.  Performed at [facility], [address] [place], [place] [address]   SARS Coronavirus 2 by RT PCR (hospital order, performed in [facility] hospital lab) Nasopharyngeal Nasopharyngeal Swab     Status: None   Collection Time: 12/20/19  2:06 PM   Specimen: Nasopharyngeal Swab  Result Value Ref Range Status   SARS Coronavirus 2 NEGATIVE NEGATIVE Final    Comment: (NOTE) SARS-CoV-2 target nucleic acids are NOT DETECTED.  The SARS-CoV-2 RNA is generally detectable in upper and lower respiratory specimens during the acute phase of infection. The lowest concentration of SARS-CoV-2 viral copies this assay can detect is 250 copies / mL. A negative result does not preclude SARS-CoV-2 infection and should not be used as the sole basis for treatment or other patient management decisions.  A negative result may occur with improper specimen collection / handling, submission of specimen other than nasopharyngeal swab, presence of viral mutation(s) within the areas targeted by this assay, and inadequate number of viral copies (<250 copies / mL). A negative result must be combined with clinical observations, patient history, and epidemiological information.  Fact Sheet for Patients:    StrictlyIdeas.no  Fact Sheet for Healthcare Providers: BankingDealers.co.za  This test is not yet approved or  cleared by the [place] FDA and has been authorized for detection and/or diagnosis of SARS-CoV-2 by FDA under an Emergency Use Authorization (EUA).  This EUA will remain in effect (meaning this test can be used) for  the duration of the COVID-19 declaration under Section 564(b)(1) of the Act, 21 U.S.C. section 360bbb-3(b)(1), unless the authorization is terminated or revoked sooner.  Performed at [facility], [address] [place], [place] [address]   Culture, blood (Routine x 2)     Status: None (Preliminary result)   Collection Time: 12/20/19  2:27 PM   Specimen: BLOOD LEFT FOREARM  Result Value Ref Range Status   Specimen Description   Final    BLOOD LEFT FOREARM Performed at [facility], [address] [place], [place] [address]    Special Requests   Final    BOTTLES DRAWN AEROBIC AND ANAEROBIC Blood Culture adequate volume Performed at [facility], [address] [place], [place] [address]    Culture   Final    NO GROWTH 2 DAYS Performed at [facility], [place] [address], [place], [place] [address]    Report Status PENDING  Incomplete  Urine culture     Status: None   Collection Time: 12/20/19  9:24 PM   Specimen: Urine, Random  Result Value Ref Range Status   Specimen Description   Final    URINE, RANDOM Performed at [facility], [address] [place], [place] [address]    Special Requests   Final    NONE Performed at [facility], [address] [place], [place] [address]    Culture   Final    NO GROWTH Performed at [facility], [place] [address], [place], [place] [address]    Report Status 12/22/2019 FINAL  Final      Radiology Studies: CT Angio Chest PE W and/or Wo Contrast  Result Date: 12/20/2019 CLINICAL DATA:  PE  suspected, high prob.  Lung cancer. EXAM: CT ANGIOGRAPHY CHEST WITH CONTRAST TECHNIQUE: Multidetector CT imaging of the chest was performed using the standard protocol during bolus administration of intravenous contrast. Multiplanar CT image reconstructions and MIPs were obtained to evaluate the vascular anatomy. CONTRAST:  179mL OMNIPAQUE IOHEXOL 350 MG/ML SOLN COMPARISON:  Radiograph earlier today.  Chest CTA 08/29/2019 FINDINGS: Cardiovascular: There are no filling defects within the pulmonary arteries to suggest pulmonary embolus. Again seen truncation of the right lower lobe pulmonary arteries in the region of masslike opacity. Aortic atherosclerosis without aneurysm or dissection. Left vertebral artery arises directly from the thoracic aorta. Heart is normal in size. No pericardial effusion. There are coronary artery calcifications. Mediastinum/Nodes: Stable 10 mm right hilar lymph node. Decreased size of lower paratracheal node measuring 9 mm, previously 12 mm. No new hilar or mediastinal adenopathy. No esophageal wall thickening. No visualized thyroid nodule. Lungs/Pleura: Decreased size of masslike consolidation in the right lower lobe with residual mass measuring approximately 3.9 x 3.3 cm, measured on series 6, image 50. Adjacent irregular atelectasis. Small right pleural effusion and pleural thickening. The adjacent 1 cm nodule in the medial right lung base on prior is no longer seen. Subpleural reticulation in the anterior right upper lobe series 6, image 37, with decreased soft tissue component from prior exam. Underlying emphysema and subpleural reticulation/fibrosis, unchanged. No findings of pulmonary edema. Upper Abdomen: No acute findings.  Post cholecystectomy. Musculoskeletal: There are no acute or suspicious osseous abnormalities. Bones are under mineralized. Degenerative change in the spine. Prior surgical fixation of the left proximal humerus. Review of the MIP images confirms the above  findings. IMPRESSION: 1. No pulmonary embolus. 2. Decreased size of masslike  consolidation in the right lower lobe with residual mass measuring approximately 3.9 x 3.3 cm. The adjacent 1 cm nodule in the medial right lung base on prior is no longer seen. Small right pleural effusion and pleural thickening. 3. Decreased soft tissue density of the subpleural reticulation in the right upper lobe from prior. 4. Slight decreased size of mediastinal lymph nodes. Aortic Atherosclerosis (ICD10-I70.0) and Emphysema (ICD10-J43.9). Electronically Signed   By: Keith Rake M.D.   On: 12/20/2019 18:13     Scheduled Meds: . acetaminophen  650 mg Oral Q6H  . enoxaparin (LOVENOX) injection  40 mg Subcutaneous Q24H  . feeding supplement  1 Container Oral TID BM  . fluticasone  2 spray Each Nare Daily  . guaiFENesin  600 mg Oral BID  . hydrocortisone cream   Topical BID  . pantoprazole (PROTONIX) IV  40 mg Intravenous Q12H  . sucralfate  1 g Oral TID WC & HS  . thiothixene  2 mg Oral TID   Continuous Infusions: . dextrose 5% lactated ringers 75 mL/hr at 12/22/19 1439  . fluconazole (DIFLUCAN) IV 200 mg (12/22/19 1300)     LOS: 1 day    Donnamae Jude, MD 12/22/2019 5:04 PM [phone] [facility] If 7PM-7AM, please contact night-coverage 12/22/2019, 5:04 PM

## 2019-12-22 NOTE — Plan of Care (Signed)

## 2019-12-23 LAB — CBC
HCT: 22.7 % — ABNORMAL LOW (ref 39.0–52.0)
Hemoglobin: 7.7 g/dL — ABNORMAL LOW (ref 13.0–17.0)
MCH: 31.6 pg (ref 26.0–34.0)
MCHC: 33.9 g/dL (ref 30.0–36.0)
MCV: 93 fL (ref 80.0–100.0)
Platelets: 284 10*3/uL (ref 150–400)
RBC: 2.44 MIL/uL — ABNORMAL LOW (ref 4.22–5.81)
RDW: 17.3 % — ABNORMAL HIGH (ref 11.5–15.5)
WBC: 3.5 10*3/uL — ABNORMAL LOW (ref 4.0–10.5)
nRBC: 0 % (ref 0.0–0.2)

## 2019-12-23 LAB — COMPREHENSIVE METABOLIC PANEL
ALT: 15 U/L (ref 0–44)
AST: 41 U/L (ref 15–41)
Albumin: 2.3 g/dL — ABNORMAL LOW (ref 3.5–5.0)
Alkaline Phosphatase: 38 U/L (ref 38–126)
Anion gap: 8 (ref 5–15)
BUN: <5 mg/dL — ABNORMAL LOW (ref 8–23)
CO2: 24 mmol/L (ref 22–32)
Calcium: 8 mg/dL — ABNORMAL LOW (ref 8.9–10.3)
Chloride: 104 mmol/L (ref 98–111)
Creatinine, Ser: 0.9 mg/dL (ref 0.61–1.24)
GFR calc Af Amer: >60 mL/min (ref >60)
GFR calc non Af Amer: >60 mL/min (ref >60)
Glucose, Bld: 124 mg/dL — ABNORMAL HIGH (ref 70–99)
Potassium: 3 mmol/L — ABNORMAL LOW (ref 3.5–5.1)
Sodium: 136 mmol/L (ref 135–145)
Total Bilirubin: 0.9 mg/dL (ref 0.3–1.2)
Total Protein: 5.2 g/dL — ABNORMAL LOW (ref 6.5–8.1)

## 2019-12-23 LAB — MAGNESIUM: Magnesium: 1.9 mg/dL (ref 1.7–2.4)

## 2019-12-23 MED ORDER — POTASSIUM CHLORIDE CRYS ER 10 MEQ PO TBCR
10.0000 meq | EXTENDED_RELEASE_TABLET | Freq: Three times a day (TID) | ORAL | Status: DC
  Administered 2019-12-23 – 2019-12-24 (×3): 10 meq via ORAL
  Filled 2019-12-23 (×3): qty 1

## 2019-12-23 NOTE — Progress Notes (Signed)
Patient ID: Melvin Foley, male   DOB: 1946/01/31, 74 y.o.   MRN: [id number]  PROGRESS NOTE    Melvin Foley  XTK:[id number] DOB: [birth year] DOA: 12/20/2019 PCP: Wallie Char, FNP    Brief Narrative:  Melvin Foley a 73 y.o.malewith medical history significant ofNSCLC (adenocarcinoma of RLL of lung, currently on chemo and radiation), HTN, OSA, schizophrenia, COPD, BPD, L CEA 2019, prior stroke on plavix.  Pt recently admitted to hospital from 8/28 to 8/31 with neutropenic fever, cough. Also had radiation esophagitis symptoms at that time.  Neutropenia improved with granix, pt discharged on augmentin, also on carafate to try and tolerate some POs.  Since discharge has had some fevers at home, ongoing cough, but biggest complaint remains CP after swallowing. Even drinking liquids makes things significantly worse. Came to ER because he couldn't tolerate this anymore.    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 Radiation esophagitis. Patient continue with severe odynophagia, not able to tolerate clear liquids. He completed radiation therapy 9 days ago.   Medical therapy with sucralfate, pantoprazole and lidocaine viscus.  Hydromorphone and scheduled Tylenol. Add oxycodone as needed for moderate pain.   Add fluconazole empiric treatment.   Continue IV fluids with dextrose and balanced electrolyte solutions.   Continue with clear liquid diet..may advance to full liquids but no further  GI on board  2. Recovering neutropenic fever. Patient has remained afebrile, wbc at 3.5 has been stable.   3. Dehydration with hypokalemia/ hyponatremia/ hypomagensemia. Continue IV fluids with dextrose  Trend electrolytes  replete as needed  4. Right lower lobe lung adenocarcinoma stage IIIB/  chronic anemia of malignancy. Patient follows up at [facility], old records personally reviewed.  Consult nutrition.  Hgb is 7.3 and Hct at 22.4. Will continue close follow up, plan for PRBC transfusion when Hgb less than 7 or if signs of acute bleeding.  5. Schizophrenia. Stable with no decompensation, continue with Thiotixene.  6. Carotid stenosis. Status post carotid endarterectomy in 2019  Continue Plavix   DVT prophylaxis: Lovenox SQ Code Status: DNR  Family Communication: Patient at bedside Disposition Plan: Home   Patient remains inpatient due to ongoing needs for IV hydration and inability to keep himself hydrated due to his ongoing esophagitis.   Consultants:   GI  Nutrition  Procedures:  None  Antimicrobials: Anti-infectives (From admission, onward)   Start     Dose/Rate Route Frequency Ordered Stop   12/22/19 1300  fluconazole (DIFLUCAN) IVPB 200 mg        200 mg 100 mL/hr over 60 Minutes Intravenous Every 24 hours 12/21/19 1218     12/21/19 1300  fluconazole (DIFLUCAN) IVPB 400 mg        400 mg 100 mL/hr over 120 Minutes Intravenous  Once 12/21/19 1218 12/21/19 1559   12/21/19 1000  amoxicillin-clavulanate (AUGMENTIN) 875-125 MG per tablet 1 tablet  Status:  Discontinued        1 tablet Oral Every 12 hours 12/21/19 0415 12/21/19 1218       Subjective: Reports pain is slightly improved today they are working on timing of his viscous lidocaine and his morphine at the time of his tray.  GI has recommended he continue on clear to full liquids.  Objective: Vitals:   12/22/19 1346 12/22/19 1953 12/23/19 9924  12/23/19 1242  BP: 92/60 (!) 88/62 92/61 95/60   Pulse:  66 68 80  Resp:  18 18 18   Temp:  98.4 F (36.9 C) 98.2 F (36.8 C) 98.6 F (37 C)  TempSrc:  Oral Oral Oral  SpO2:  96% 97% 100%  Weight:      Height:        Intake/Output Summary (Last 24 hours) at 12/23/2019 1313 Last data filed at 12/23/2019 1121 Gross per 24 hour  Intake  2806.09 ml  Output 400 ml  Net 2406.09 ml   Filed Weights   12/20/19 1348  Weight: 68.9 kg    Examination:  General exam: Appears calm and comfortable, thin Respiratory system: Clear to auscultation. Respiratory effort normal. Cardiovascular system: S1 & S2 heard, RRR.  Gastrointestinal system: Abdomen is nondistended, soft and nontender.  Extremities: Symmetric  Skin: No rashes Psychiatry: Judgement and insight appear normal. Mood & affect appropriate.     Data Reviewed: I have personally reviewed following labs and imaging studies  CBC: Recent Labs  Lab 12/17/19 0531 12/20/19 1406 12/21/19 0747 12/21/19 1457 12/23/19 0119  WBC 3.4* 3.4* 3.0* 3.3* 3.5*  NEUTROABS 2.4 1.7  --  2.4  --   HGB 9.3* 9.2* 7.3* 8.0* 7.7*  HCT 27.0* 27.6* 22.4* 23.8* 22.7*  MCV 88.8 92.0 91.1 91.2 93.0  PLT 204 307 239 237 935   Basic Metabolic Panel: Recent Labs  Lab 12/17/19 0531 12/20/19 1406 12/20/19 1433 12/21/19 0747 12/22/19 0203 12/23/19 0119  NA 135 134*  --  133* 139 136  K 3.2* 3.3*  --  3.3* 3.5 3.0*  CL 102 99  --  102 106 104  CO2 23 25  --  23 25 24   GLUCOSE 93 109*  --  93 107* 124*  BUN 11 12  --  9 5* <5*  CREATININE 0.84 1.01  --  0.94 0.83 0.90  CALCIUM 8.4* 8.3*  --  7.6* 8.1* 8.0*  MG  --   --  1.4*  --  1.5* 1.9   GFR: Estimated Creatinine Clearance: 69.7 mL/min (by C-G formula based on SCr of 0.9 mg/dL). Liver Function Tests: Recent Labs  Lab 12/17/19 0531 12/20/19 1406 12/23/19 0119  AST 18 26 41  ALT 13 12 15   ALKPHOS 41 46 38  BILITOT 1.1 1.1 0.9  PROT 6.2* 7.0 5.2*  ALBUMIN 3.1* 3.4* 2.3*   Coagulation Profile: Recent Labs  Lab 12/20/19 1406  INR 1.2   Sepsis Labs: Recent Labs  Lab 12/20/19 1406 12/20/19 1546  LATICACIDVEN 2.8* 1.7    Recent Results (from the past 240 hour(s))  Blood culture (routine x 2)     Status: None   Collection Time: 12/14/19 11:12 AM   Specimen: BLOOD  Result Value Ref Range Status   Specimen  Description   Final    BLOOD RIGHT ANTECUBITAL Performed at [facility], [place] [address] [place], [place] [address]    Special Requests   Final    BOTTLES DRAWN AEROBIC AND ANAEROBIC Blood Culture adequate volume Performed at [facility], [address] [place], [place] [address]    Culture   Final    NO GROWTH 5 DAYS Performed at [facility], [place] [address], [place], [place] [address]    Report Status 12/19/2019 FINAL  Final  Blood culture (routine x 2)     Status: None   Collection Time: 12/14/19 11:15 AM   Specimen: BLOOD  Result Value Ref Range Status   Specimen Description   Final    BLOOD LEFT ANTECUBITAL Performed at [facility], [place] [address], [place], [place] [address]    Special Requests   Final    BOTTLES DRAWN AEROBIC AND ANAEROBIC Blood Culture adequate volume Performed at [facility], [address] [place], [place] [address]    Culture   Final    NO GROWTH 5 DAYS Performed at [facility], [place] [address], [place], [place] [address]    Report Status 12/19/2019 FINAL  Final  SARS Coronavirus 2 by RT PCR (hospital order, performed in [facility] hospital lab) Nasopharyngeal     Status: None   Collection Time: 12/14/19 11:24 AM   Specimen: Nasopharyngeal  Result Value Ref Range Status   SARS Coronavirus 2 NEGATIVE NEGATIVE Final    Comment: (NOTE) SARS-CoV-2 target nucleic acids are NOT DETECTED.  The SARS-CoV-2 RNA is generally detectable in upper and lower respiratory specimens during the acute phase of infection. The lowest concentration of SARS-CoV-2 viral copies this assay can detect is 250 copies / mL. A negative result does not preclude SARS-CoV-2 infection and should not be used as the sole basis for treatment or other patient management decisions.  A negative result may occur with improper specimen collection / handling, submission of specimen other than nasopharyngeal swab, presence of viral  mutation(s) within the areas targeted by this assay, and inadequate number of viral copies (<250 copies / mL). A negative result must be combined with clinical observations, patient history, and epidemiological information.  Fact Sheet for Patients:   StrictlyIdeas.no  Fact Sheet for Healthcare Providers: BankingDealers.co.za  This test is not yet approved or  cleared by the [place] FDA and has been authorized for detection and/or diagnosis of SARS-CoV-2 by FDA under an Emergency Use Authorization (EUA).  This EUA will remain in effect (meaning this test can be used) for the duration of the COVID-19 declaration under Section 564(b)(1) of the Act, 21 U.S.C. section 360bbb-3(b)(1), unless the authorization is terminated or revoked sooner.  Performed at [facility], [address] [place], [place] [address]   SARS Coronavirus 2 by RT PCR (hospital order, performed in [facility] hospital lab) Nasopharyngeal Nasopharyngeal Swab     Status: None   Collection Time: 12/20/19  2:06 PM   Specimen: Nasopharyngeal Swab  Result Value Ref Range Status   SARS Coronavirus 2 NEGATIVE NEGATIVE Final    Comment: (NOTE) SARS-CoV-2 target nucleic acids are NOT DETECTED.  The SARS-CoV-2 RNA is generally detectable in upper and lower respiratory specimens during the acute phase of infection. The lowest concentration of SARS-CoV-2 viral copies this assay can detect is 250 copies / mL. A negative result does not preclude SARS-CoV-2 infection and should not be used as the sole basis for treatment or other patient management decisions.  A negative result may occur with improper specimen collection / handling, submission of specimen other than nasopharyngeal swab, presence of viral mutation(s) within the areas targeted by this assay, and inadequate number of viral copies (<250 copies / mL). A negative result must be combined with  clinical observations, patient history, and epidemiological information.  Fact Sheet for Patients:   StrictlyIdeas.no  Fact Sheet for Healthcare Providers: BankingDealers.co.za  This test is not yet approved or  cleared by the [place] FDA and has been authorized for detection and/or diagnosis of SARS-CoV-2 by FDA under an Emergency Use Authorization (  EUA).  This EUA will remain in effect (meaning this test can be used) for the duration of the COVID-19 declaration under Section 564(b)(1) of the Act, 21 U.S.C. section 360bbb-3(b)(1), unless the authorization is terminated or revoked sooner.  Performed at [facility], [address] [place], [place] [address]   Culture, blood (Routine x 2)     Status: None (Preliminary result)   Collection Time: 12/20/19  2:27 PM   Specimen: BLOOD LEFT FOREARM  Result Value Ref Range Status   Specimen Description   Final    BLOOD LEFT FOREARM Performed at [facility], [address] [place], [place] [address]    Special Requests   Final    BOTTLES DRAWN AEROBIC AND ANAEROBIC Blood Culture adequate volume Performed at [facility], [address] [place], [place] [address]    Culture   Final    NO GROWTH 3 DAYS Performed at [facility], [place] [address], [place], [place] [address]    Report Status PENDING  Incomplete  Urine culture     Status: None   Collection Time: 12/20/19  9:24 PM   Specimen: Urine, Random  Result Value Ref Range Status   Specimen Description   Final    URINE, RANDOM Performed at [facility], [address] [place], [place] [address]    Special Requests   Final    NONE Performed at [facility], [address] [place], [place] [address]    Culture   Final    NO GROWTH Performed at [facility], [place] [address], [place], [place] [address]    Report Status 12/22/2019 FINAL  Final       Radiology Studies: No results found.   Scheduled Meds: . acetaminophen  650 mg Oral Q6H  . enoxaparin (LOVENOX) injection  40 mg Subcutaneous Q24H  . feeding supplement  1 Container Oral TID BM  . fluticasone  2 spray Each Nare Daily  . guaiFENesin  600 mg Oral BID  . hydrocortisone cream   Topical BID  . pantoprazole (PROTONIX) IV  40 mg Intravenous Q12H  . sucralfate  1 g Oral TID WC & HS  . thiothixene  2 mg Oral TID   Continuous Infusions: . dextrose 5% lactated ringers 75 mL/hr at 12/23/19 1238  . fluconazole (DIFLUCAN) IV 200 mg (12/22/19 1300)     LOS: 2 days    Donnamae Jude, MD 12/23/2019 1:13 PM [phone] [facility] If 7PM-7AM, please contact night-coverage 12/23/2019, 1:13 PM

## 2019-12-23 NOTE — Progress Notes (Addendum)
    Progress Note   Subjective  Pt is tolerating some clear liquids and some full liquids, slightly improved   Objective  Vital signs in last 24 hours: Temp:  [98.2 F (36.8 C)-99.1 F (37.3 C)] 98.2 F (36.8 C) (09/06 0428) Pulse Rate:  [66-74] 68 (09/06 0428) Resp:  [16-18] 18 (09/06 0428) BP: (88-92)/(60-63) 92/61 (09/06 0428) SpO2:  [96 %-97 %] 97 % (09/06 0428) Last BM Date: 12/22/19  General: Alert, well-developed, in NAD Heart:  Regular rate and rhythm; no murmurs Chest: Clear to ascultation bilaterally Abdomen:  Soft, nontender and nondistended. Normal bowel sounds, without guarding, and without rebound.   Extremities:  Without edema. Neurologic:  Alert and  oriented x4; grossly normal neurologically. Psych:  Alert and cooperative. Normal mood and affect.  Intake/Output from previous day: 09/05 0701 - 09/06 0700 In: 2506.1 [P.O.:390; I.V.:2016.1; IV Piggyback:100] Out: 600 [Urine:600] Intake/Output this shift: No intake/output data recorded.  Lab Results: Recent Labs    12/21/19 0747 12/21/19 1457 12/23/19 0119  WBC 3.0* 3.3* 3.5*  HGB 7.3* 8.0* 7.7*  HCT 22.4* 23.8* 22.7*  PLT 239 237 284   BMET Recent Labs    12/21/19 0747 12/22/19 0203 12/23/19 0119  NA 133* 139 136  K 3.3* 3.5 3.0*  CL 102 106 104  CO2 23 25 24   GLUCOSE 93 107* 124*  BUN 9 5* <5*  CREATININE 0.94 0.83 0.90  CALCIUM 7.6* 8.1* 8.0*   LFT Recent Labs    12/23/19 0119  PROT 5.2*  ALBUMIN 2.3*  AST 41  ALT 15  ALKPHOS 38  BILITOT 0.9   PT/INR Recent Labs    12/20/19 1406  LABPROT 14.4  INR 1.2      Assessment & Recommendations   1. Severe dysphagia / odynophagia d/t radiation esophagitis which has slightly improved. Do not advance diet beyond full liquids. He probably will be on full liquids with nutrition supplements for several days to a few weeks. All meals fully upright in sitting position. Adjust timing of prn viscous lidocaine to 10 minutes before meals.  Up in chair and ambulate. Nutrition consult. Continue current medications.   2. Hypokalemia. Supplement IV as potasium is an esophageal irritant.    Dr. Rush Landmark is covering the [facility] GI inpatient service starting tomorrow.    LOS: 2 days   Norberto Sorenson T. Fuller Plan MD 12/23/2019, 9:46 AM

## 2019-12-24 ENCOUNTER — Encounter (HOSPITAL_COMMUNITY): Payer: Self-pay | Admitting: Family Medicine

## 2019-12-24 DIAGNOSIS — Z7902 Long term (current) use of antithrombotics/antiplatelets: Secondary | ICD-10-CM

## 2019-12-24 LAB — CBC
HCT: 22.4 % — ABNORMAL LOW (ref 39.0–52.0)
Hemoglobin: 7.2 g/dL — ABNORMAL LOW (ref 13.0–17.0)
MCH: 29.6 pg (ref 26.0–34.0)
MCHC: 32.1 g/dL (ref 30.0–36.0)
MCV: 92.2 fL (ref 80.0–100.0)
Platelets: 273 10*3/uL (ref 150–400)
RBC: 2.43 MIL/uL — ABNORMAL LOW (ref 4.22–5.81)
RDW: 17.2 % — ABNORMAL HIGH (ref 11.5–15.5)
WBC: 4 10*3/uL (ref 4.0–10.5)
nRBC: 0 % (ref 0.0–0.2)

## 2019-12-24 LAB — COMPREHENSIVE METABOLIC PANEL
ALT: 15 U/L (ref 0–44)
AST: 36 U/L (ref 15–41)
Albumin: 2.3 g/dL — ABNORMAL LOW (ref 3.5–5.0)
Alkaline Phosphatase: 36 U/L — ABNORMAL LOW (ref 38–126)
Anion gap: 7 (ref 5–15)
BUN: <5 mg/dL — ABNORMAL LOW (ref 8–23)
CO2: 24 mmol/L (ref 22–32)
Calcium: 7.9 mg/dL — ABNORMAL LOW (ref 8.9–10.3)
Chloride: 104 mmol/L (ref 98–111)
Creatinine, Ser: 0.88 mg/dL (ref 0.61–1.24)
GFR calc Af Amer: >60 mL/min (ref >60)
GFR calc non Af Amer: >60 mL/min (ref >60)
Glucose, Bld: 111 mg/dL — ABNORMAL HIGH (ref 70–99)
Potassium: 3.4 mmol/L — ABNORMAL LOW (ref 3.5–5.1)
Sodium: 135 mmol/L (ref 135–145)
Total Bilirubin: 1.1 mg/dL (ref 0.3–1.2)
Total Protein: 5 g/dL — ABNORMAL LOW (ref 6.5–8.1)

## 2019-12-24 LAB — FERRITIN: Ferritin: 661 ng/mL — ABNORMAL HIGH (ref 24–336)

## 2019-12-24 LAB — IRON AND TIBC
Iron: 26 ug/dL — ABNORMAL LOW (ref 45–182)
Saturation Ratios: 16 % — ABNORMAL LOW (ref 17.9–39.5)
TIBC: 161 ug/dL — ABNORMAL LOW (ref 250–450)
UIBC: 135 ug/dL

## 2019-12-24 LAB — FOLATE: Folate: 6.7 ng/mL (ref >5.9)

## 2019-12-24 LAB — MAGNESIUM: Magnesium: 1.3 mg/dL — ABNORMAL LOW (ref 1.7–2.4)

## 2019-12-24 LAB — VITAMIN B12: Vitamin B-12: 805 pg/mL (ref 180–914)

## 2019-12-24 MED ORDER — SODIUM CHLORIDE 0.9 % IV SOLN
510.0000 mg | Freq: Once | INTRAVENOUS | Status: AC
  Administered 2019-12-24: 510 mg via INTRAVENOUS
  Filled 2019-12-24: qty 17

## 2019-12-24 MED ORDER — ADULT MULTIVITAMIN W/MINERALS CH
1.0000 | ORAL_TABLET | Freq: Every day | ORAL | Status: DC
  Administered 2019-12-24 – 2019-12-27 (×4): 1 via ORAL
  Filled 2019-12-24 (×4): qty 1

## 2019-12-24 MED ORDER — HYDROCODONE-ACETAMINOPHEN 10-325 MG PO TABS
1.0000 | ORAL_TABLET | ORAL | Status: DC | PRN
  Administered 2019-12-24 – 2019-12-27 (×6): 1 via ORAL
  Filled 2019-12-24 (×9): qty 1

## 2019-12-24 MED ORDER — OXYCODONE HCL 5 MG PO TABS: 10.0000 mg | ORAL_TABLET | ORAL | Status: DC | PRN

## 2019-12-24 MED ORDER — LIDOCAINE VISCOUS HCL 2 % MT SOLN
15.0000 mL | Freq: Three times a day (TID) | OROMUCOSAL | Status: DC
  Administered 2019-12-24 – 2019-12-27 (×12): 15 mL via OROMUCOSAL
  Filled 2019-12-24 (×16): qty 15

## 2019-12-24 MED ORDER — MAGNESIUM SULFATE 2 GM/50ML IV SOLN
2.0000 g | Freq: Once | INTRAVENOUS | Status: AC
  Administered 2019-12-24: 2 g via INTRAVENOUS
  Filled 2019-12-24: qty 50

## 2019-12-24 MED ORDER — ENOXAPARIN SODIUM 40 MG/0.4ML ~~LOC~~ SOLN
40.0000 mg | SUBCUTANEOUS | Status: DC
  Administered 2019-12-26 – 2019-12-27 (×2): 40 mg via SUBCUTANEOUS
  Filled 2019-12-24 (×2): qty 0.4

## 2019-12-24 MED ORDER — ACETAMINOPHEN 325 MG PO TABS
650.0000 mg | ORAL_TABLET | Freq: Four times a day (QID) | ORAL | Status: DC | PRN
  Administered 2019-12-24: 650 mg via ORAL
  Filled 2019-12-24: qty 2

## 2019-12-24 MED ORDER — ENSURE ENLIVE PO LIQD
237.0000 mL | Freq: Three times a day (TID) | ORAL | Status: DC
  Administered 2019-12-24 – 2019-12-27 (×5): 237 mL via ORAL

## 2019-12-24 MED ORDER — POTASSIUM CHLORIDE 10 MEQ/100ML IV SOLN
10.0000 meq | INTRAVENOUS | Status: AC
  Administered 2019-12-24 (×4): 10 meq via INTRAVENOUS
  Filled 2019-12-24 (×4): qty 100

## 2019-12-24 MED ORDER — HYDROMORPHONE HCL 1 MG/ML IJ SOLN
1.0000 mg | Freq: Three times a day (TID) | INTRAMUSCULAR | Status: DC | PRN
  Administered 2019-12-24 – 2019-12-25 (×3): 1 mg via INTRAVENOUS
  Filled 2019-12-24 (×3): qty 1

## 2019-12-24 MED ORDER — HYDROCODONE-ACETAMINOPHEN 10-325 MG PO TABS
1.0000 | ORAL_TABLET | ORAL | Status: DC | PRN
  Administered 2019-12-24: 1 via ORAL
  Filled 2019-12-24: qty 1

## 2019-12-24 NOTE — Evaluation (Signed)
Physical Therapy Evaluation Patient Details Name: Melvin Foley MRN: [id number] DOB: 07/16/1945 Today's Date: 12/24/2019   History of Present Illness  Pt is a 74 y/o male admitted secondary to increased SOB and severe dysphagia and odynophagia. 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 PMH includes lung cancer, HTN, COPD, and schizophrenia.   Clinical Impression  Pt admitted secondary to problem above with deficits below. Presenting with increased fatigue and weakness. Only agreeable to ambulating within the room. Slightly impulsive with mobility tasks. Feel pt would benefit from HHPT, however, pt currently refusing. Will continue to follow acutely.     Follow Up Recommendations Other (comment);Supervision for mobility/OOB (pt refusing HHPT )    Equipment Recommendations  Rolling walker with 5" wheels    Recommendations for Other Services       Precautions / Restrictions Precautions Precautions: Fall Restrictions Weight Bearing Restrictions: No      Mobility  Bed Mobility Overal bed mobility: Needs Assistance Bed Mobility: Supine to Sit;Sit to Supine     Supine to sit: Supervision Sit to supine: Supervision   General bed mobility comments: Supervision for safety and line management.   Transfers Overall transfer level: Needs assistance Equipment used: Rolling walker (2 wheeled) Transfers: Sit to/from Stand Sit to Stand: Min assist         General transfer comment: Min A for steadying assist to stand.   Ambulation/Gait Ambulation/Gait assistance: Min guard Gait Distance (Feet): 20 Feet Assistive device: Rolling walker (2 wheeled) Gait Pattern/deviations: Step-through pattern;Decreased stride length;Trunk flexed Gait velocity: Decreased   General Gait Details: Cues for safe use of RW. Min guard for safety. Slightly impulsive and requiring safety cues throughout.   Stairs            Wheelchair Mobility    Modified Rankin (Stroke Patients Only)        Balance Overall balance assessment: Needs assistance Sitting-balance support: Feet supported;No upper extremity supported Sitting balance-Leahy Scale: Fair     Standing balance support: Bilateral upper extremity supported;During functional activity Standing balance-Leahy Scale: Poor Standing balance comment: Reliant on BUE support                              Pertinent Vitals/Pain Pain Assessment: No/denies pain    Home Living Family/patient expects to be discharged to:: Private residence Living Arrangements: Spouse/significant other Available Help at Discharge: Family;Available PRN/intermittently Type of Home: House Home Access: Level entry     Home Layout: One level Home Equipment: None      Prior Function Level of Independence: Independent               Hand Dominance   Dominant Hand: Right    Extremity/Trunk Assessment   Upper Extremity Assessment Upper Extremity Assessment: Defer to OT evaluation    Lower Extremity Assessment Lower Extremity Assessment: Generalized weakness    Cervical / Trunk Assessment Cervical / Trunk Assessment: Normal  Communication   Communication: No difficulties  Cognition Arousal/Alertness: Awake/alert Behavior During Therapy: Impulsive Overall Cognitive Status: No family/caregiver present to determine baseline cognitive functioning                                 General Comments: Pt slightly impulsive throughout. Seemed to be annoyed by PT presence. Unsure of baseline.       General Comments      Exercises     Assessment/Plan  PT Assessment Patient needs continued PT services  PT Problem List Decreased strength;Decreased balance;Decreased mobility;Decreased activity tolerance;Decreased knowledge of use of DME;Decreased knowledge of precautions;Decreased safety awareness       PT Treatment Interventions Gait training;DME instruction;Therapeutic activities;Functional mobility  training;Balance training;Therapeutic exercise;Patient/family education    PT Goals (Current goals can be found in the Care Plan section)  Acute Rehab PT Goals Patient Stated Goal: "get my morphine"  PT Goal Formulation: With patient Time For Goal Achievement: 01/07/20 Potential to Achieve Goals: Fair    Frequency Min 3X/week   Barriers to discharge        Co-evaluation               AM-PAC PT "6 Clicks" Mobility  Outcome Measure Help needed turning from your back to your side while in a flat bed without using bedrails?: None Help needed moving from lying on your back to sitting on the side of a flat bed without using bedrails?: None Help needed moving to and from a bed to a chair (including a wheelchair)?: A Little Help needed standing up from a chair using your arms (e.g., wheelchair or bedside chair)?: A Little Help needed to walk in hospital room?: A Little Help needed climbing 3-5 steps with a railing? : A Lot 6 Click Score: 19    End of Session   Activity Tolerance: Patient limited by fatigue Patient left: in bed;with call bell/phone within reach Nurse Communication: Mobility status PT Visit Diagnosis: Unsteadiness on feet (R26.81);Muscle weakness (generalized) (M62.81)    Time: 7225-7505 PT Time Calculation (min) (ACUTE ONLY): 10 min   Charges:   PT Evaluation $PT Eval Moderate Complexity: 1 Mod          Reuel Derby, PT, DPT  Acute Rehabilitation Services  Pager: [phone] Office: [phone]   Rudean Hitt 12/24/2019, 6:16 PM

## 2019-12-24 NOTE — Progress Notes (Signed)
See                                                             Daily Rounding Note  12/24/2019, 10:29 AM  LOS: 3 days   SUBJECTIVE:   Chief complaint:   odynophagia   Still has some discomfort w swallow but improved.  Tolerating FLs.  Intake variable from zero to 75%.     OBJECTIVE:         Vital signs in last 24 hours:    Temp:  [98.3 F (36.8 C)-98.6 F (37 C)] 98.5 F (36.9 C) (09/07 0359) Pulse Rate:  [64-80] 68 (09/07 0359) Resp:  [16-18] 17 (09/07 0359) BP: (90-125)/(54-76) 90/54 (09/07 0359) SpO2:  [99 %-100 %] 99 % (09/07 0359) Last BM Date: 12/22/19 Filed Weights   12/20/19 1348  Weight: 68.9 kg   General: looks unwell.  Comfortable, pleasant   Heart: RRR Chest: no labored breathing.  hoarse Abdomen: NT, soft, active BS  Extremities: no CCE Derm: radiation associated hyperpigmentation w central target on upper right back Neuro/Psych:  Oriented x 3.  Alert.  Cooperative.  Unsteady on feet.    Intake/Output from previous day: 09/06 0701 - 09/07 0700 In: 750 [P.O.:750] Out: 600 [Urine:600]  Intake/Output this shift: Total I/O In: 200 [P.O.:200] Out: -   Lab Results: Recent Labs    12/21/19 1457 12/23/19 0119 12/24/19 0107  WBC 3.3* 3.5* 4.0  HGB 8.0* 7.7* 7.2*  HCT 23.8* 22.7* 22.4*  PLT 237 284 273   BMET Recent Labs    12/22/19 0203 12/23/19 0119 12/24/19 0107  NA 139 136 135  K 3.5 3.0* 3.4*  CL 106 104 104  CO2 25 24 24   GLUCOSE 107* 124* 111*  BUN 5* <5* <5*  CREATININE 0.83 0.90 0.88  CALCIUM 8.1* 8.0* 7.9*   LFT Recent Labs    12/23/19 0119 12/24/19 0107  PROT 5.2* 5.0*  ALBUMIN 2.3* 2.3*  AST 41 36  ALT 15 15  ALKPHOS 38 36*  BILITOT 0.9 1.1   Scheduled Meds: . acetaminophen  650 mg Oral Q6H  . enoxaparin (LOVENOX) injection  40 mg Subcutaneous Q24H  . feeding supplement  1 Container Oral TID BM  . fluticasone  2 spray Each Nare Daily  . guaiFENesin  600 mg Oral BID  . hydrocortisone cream   Topical BID  .  pantoprazole (PROTONIX) IV  40 mg Intravenous Q12H  . sucralfate  1 g Oral TID WC & HS  . thiothixene  2 mg Oral TID   Continuous Infusions: . dextrose 5% lactated ringers 75 mL/hr at 12/24/19 0246  . fluconazole (DIFLUCAN) IV 200 mg (12/23/19 1515)  . magnesium sulfate bolus IVPB    . potassium chloride     PRN Meds:.albuterol, HYDROmorphone (DILAUDID) injection, lidocaine, ondansetron **OR** ondansetron (ZOFRAN) IV, oxyCODONE   ASSESMENT:   *   Severe abdominal/dysphagia due to radiation esophagitis.  *   RLL lung CA dx 08/2018.  Treated with radiation, chemo.  Had recurrence and chemo, radiation reinitiated.  Just completed 30 rounds of radiation 12/13/2019. 12/20/2019 CT angio of chest shows decreased size of mass and right upper lobe density as well as mediastinal lymph nodes.  *   Chronic Plavix (hx CVA, carotic stenosis/endarterectomy), on hold.  PLAN   *   Supportive care w full liquid diet, IV Protonix bid, Carafate, Diflucan (empiric per hospitalist), viscous lidocaine 10 mins AC.    *   Does he still need Diflucan (day 4) and if so for how long?   *    Switched viscous lidocaine to, at bedtime rather than prn as had been ordered.  However this was in use PTA along w Carafate and pt had little benefit.    *   hold tmrw AM lovenox to allow for EGD in mid AM.      Azucena Freed  12/24/2019, 10:29 AM Phone [phone]

## 2019-12-24 NOTE — Progress Notes (Signed)
Initial Nutrition Assessment  RD working remotely.  DOCUMENTATION CODES:   Not applicable  INTERVENTION:   -D/c Boost Breeze po TID, each supplement provides 250 kcal and 9 grams of protein -MVI with minerals daily -Ensure Enlive po TID, each supplement provides 350 kcal and 20 grams of protein -Magic cup TID with meals, each supplement provides 290 kcal and 9 grams of protein -Hormel Shake TID with meals, each supplement provides 520 kcals and 22 grams protein  NUTRITION DIAGNOSIS:   Inadequate oral intake related to dysphagia (painful swallowing) as evidenced by per patient/family report.  GOAL:   Patient will meet greater than or equal to 90% of their needs  MONITOR:   PO intake, Supplement acceptance, Diet advancement, Labs, Weight trends, Skin, I & O's  REASON FOR ASSESSMENT:   Consult Assessment of nutrition requirement/status, Poor PO  ASSESSMENT:   Melvin Foley is a 74 y.o. male with medical history significant of NSCLC (adenocarcinoma of RLL of lung, currently on chemo and radiation), HTN, OSA, schizophrenia, COPD, BPD, L CEA 2019, prior stroke on plavix.  Pt admitted with radiation esophagitis.   9/4- advanced to clear liquids 9/5- advanced to full liquids  Reviewed I/O's: +150 ml x 24 hours and +3.8 L since admission  UOP: 600 ml x 24 hours  Attempted to speak with pt via call to hospital room phone, however, no answer.   Per H&P, pt unable to eat or drink anything for 1 week PTA. No meal completion data currently available to assess at this time. Plan for full liquid diet with no plans for advancement at this time. Noted has Boost Breeze supplements ordered, however, is refusing. Given limitations of full liquid diet, pt will need to be heavily reliant on supplements in order to consistently meet nutritional goals.  Per GI notes, pt adamantly does not want a PEG tube currently. Plan for EGD tomorrow.   Reviewed wt hx; pt has experienced a 7.3%  wt loss over the past 3 months. While this is not significant for time frame, it is concerning given decreased oral intake and multiple co-morbidities. Pt with at high risk for malnutrition, however, unable to identify at this time.  Labs reviewed: Na: 133, K: 3.3, Mg: 1.5.   Diet Order:   Diet Order            Diet full liquid Room service appropriate? Yes with Assist; Fluid consistency: Thin  Diet effective now                 EDUCATION NEEDS:   No education needs have been identified at this time  Skin:  Skin Assessment: Reviewed RN Assessment  Last BM:  12/22/19  Height:   Ht Readings from Last 1 Encounters:  12/20/19 5\' 8"  (1.727 m)    Weight:   Wt Readings from Last 1 Encounters:  12/20/19 68.9 kg    Ideal Body Weight:  70 kg  BMI:  Body mass index is 23.11 kg/m.  Estimated Nutritional Needs:   Kcal:  2050-2250  Protein:  105-120 grams  Fluid:  > 2 L    Loistine Chance, RD, LDN, [place] Registered Dietitian II Certified Diabetes Care and Education Specialist Please refer to [facility] for RD and/or RD on-call/weekend/after hours pager

## 2019-12-24 NOTE — Anesthesia Preprocedure Evaluation (Addendum)
Anesthesia Evaluation  Patient identified by MRN, date of birth, ID band Patient awake    Reviewed: Allergy & Precautions, NPO status , Patient's Chart, lab work & pertinent test results  Airway Mallampati: II  TM Distance: >3 FB Neck ROM: Full    Dental no notable dental hx. (+) Upper Dentures, Dental Advisory Given   Pulmonary sleep apnea , former smoker,    Pulmonary exam normal breath sounds clear to auscultation       Cardiovascular hypertension, Pt. on medications Normal cardiovascular exam Rhythm:Regular Rate:Normal     Neuro/Psych Schizophrenia CVA, No Residual Symptoms    GI/Hepatic Neg liver ROS, hiatal hernia, GERD  ,  Endo/Other  negative endocrine ROS  Renal/GU negative Renal ROS     Musculoskeletal  (+) Arthritis ,   Abdominal   Peds  Hematology negative hematology ROS (+)   Anesthesia Other Findings   Reproductive/Obstetrics                            Anesthesia Physical Anesthesia Plan  ASA: III  Anesthesia Plan: MAC   Post-op Pain Management:    Induction:   PONV Risk Score and Plan: 2 and Treatment may vary due to age or medical condition, Ondansetron and Dexamethasone  Airway Management Planned: Natural Airway  Additional Equipment: None  Intra-op Plan:   Post-operative Plan: Extubation in OR  Informed Consent: I have reviewed the patients History and Physical, chart, labs and discussed the procedure including the risks, benefits and alternatives for the proposed anesthesia with the patient or authorized representative who has indicated his/her understanding and acceptance.     Dental advisory given  Plan Discussed with: CRNA  Anesthesia Plan Comments:        Anesthesia Quick Evaluation

## 2019-12-24 NOTE — Progress Notes (Signed)
PROGRESS NOTE    Melvin Foley  IRW:[id number] DOB: [birth year] DOA: 12/20/2019 PCP: Wallie Char, FNP    Brief Narrative:  Patient was admitted to the hospital working diagnosis of radiation esophagitis.  74 year old male with past medical history for adenocarcinoma of the right lower lung, currently on chemotherapy and radiation therapy. He also has hypertension, COPD, schizophrenia and obstructive sleep apnea.  He had recent hospitalization 8/28-8/31st for neutropenic fever.  Patient received Granix with improvement of leukopenia and he was discharged on Augmentin.  At home patient developed persistent fever, ongoing cough and persistent difficulty swallowing due to severe odynophagia.  On his initial physical examination blood pressure 109/72, 95/53, heart rate 77, respiratory rate 16, temperature 38.8 C, oxygen saturation 91%, her lungs are clear to auscultation bilaterally, heart S1-S2, present rhythmic, soft abdomen, no lower extremity edema. Sodium 134, potassium 3.3, chloride 99, bicarb 25, glucose 109, BUN 12, creatinine 1.0, magnesium 1.4, AST 26, ALT 12, lactic acid 2.8, white count 3.4, hemoglobin 9.2, hematocrit 27.6, platelets 307.  SARS COVID-19 negative.  Urinalysis specific gravity 1.020, negative leukocytes negative nitrates. CT chest negative for pulmonary embolism, persistent right lower lobe mass, 3.9 x 3.3 cm.  Chest radiograph with right lower lobe mass.  EKG 89 bpm, left axis deviation with left anterior fascicular block, sinus rhythm, no ST segment or T wave changes.  Patient has been placed on antiacid therapy with good toleration. Continue slow to respond, he will need to continue full liquid diet after his discharge, no plans for endoscopy during this hospitalization.   Continue to have pain and using IV hydromorphone.    Assessment & Plan:   Principal Problem:   Radiation esophagitis Active Problems:   Carotid stenosis   Fever   Primary adenocarcinoma  of lower lobe of right lung (HCC)   Bipolar disorder ([place])   1. Radiation esophagitis. He completed radiation therapy 7 days prior hospitalization. Slowly improving but continue to have pain requiring IV hydromorphone, used 5 times yesterday.   Medical therapy with antiacid / analgesics therapy: sucralfate, pantoprazole and lidocaine viscus.  He has not used any oral oxycodone, will increase dose to 10 mg as needed and will discontinue IV hydromorphone. Continue with scheduled acetaminophen.   Continue with fluconazole for suspected candida esophagitis.   Patient will need to keep full liquid diet for several weeks, will hold on IV fluids for now. Continue with nutritional supplementation. Consult PT and OT in preparation for possible dc home in am. Ok to discontinue cardiac monitor, out of bed to chair tid with meals.    2. Recovering neutropenic fever. Clinically resolved. Wbc is 4,0 and patient has been afebrile.   3. Dehydration with hypokalemia/ hyponatremia/ hypomagensemia. Stable renal function with serum cr at 0,88, continue to have hypokalemia 3,4 and hypomagnesemia 1,3.  Continue electrolyte correction with 40 meq Kcl and 2 g mag sulfate. Follow up on electrolytes in am. Hold on IV fluids for now and continue with full liquid diet.    4. 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 Iron deficiency. Patient follows up at [facility], old records personally reviewed. Iron 26, TIBC 161, Transferrin saturation 16, Ferritin 66.   Add one dose of IV iron. Hgb continue to be stable at 7,2 and Hct at 22,4.   5. Schizophrenia. No decompensation, on Thiotixene.    Status is: Inpatient  Remains inpatient appropriate because:IV treatments appropriate due to intensity of illness or inability to take PO and  Inpatient level of care appropriate due to severity of illness   Dispo: The patient is from: Home              Anticipated d/c is to: Home               Anticipated d/c date is: 1 day              Patient currently is not medically stable to d/c. possible dc in am, if continue good toleration of full liquids and better pain control.    DVT prophylaxis: scd  Enoxaparin  Code Status:    dnr   Family Communication:  No family at the bedside       Consultants:   GI      Subjective: Patient tolerating well full liquids but continue to use IV hydromorphone, no nausea or vomiting, continue to be very weak and deconditioned.   Objective: Vitals:   12/23/19 1242 12/23/19 1456 12/23/19 2027 12/24/19 0359  BP: 95/60 106/76 125/70 (!) 90/54  Pulse: 80 64 80 68  Resp: 18 16 16 17   Temp: 98.6 F (37 C) 98.3 F (36.8 C) 98.3 F (36.8 C) 98.5 F (36.9 C)  TempSrc: Oral Oral Oral Oral  SpO2: 100% 99% 99% 99%  Weight:      Height:        Intake/Output Summary (Last 24 hours) at 12/24/2019 1009 Last data filed at 12/24/2019 0938 Gross per 24 hour  Intake 830 ml  Output 400 ml  Net 430 ml   Filed Weights   12/20/19 1348  Weight: 68.9 kg    Examination:   General: Not in pain or dyspnea. Deconditioned  Neurology: Awake and alert, non focal  E ENT: mild pallor, no icterus, oral mucosa moist Cardiovascular: No JVD. S1-S2 present, rhythmic, no gallops, rubs, or murmurs. No lower extremity edema. Pulmonary: positive breath sounds bilaterally, adequate air movement, no wheezing, rhonchi or rales. Gastrointestinal. Abdomen soft and non tender Skin. No rashes Musculoskeletal: no joint deformities     Data Reviewed: I have personally reviewed following labs and imaging studies  CBC: Recent Labs  Lab 12/20/19 1406 12/21/19 0747 12/21/19 1457 12/23/19 0119 12/24/19 0107  WBC 3.4* 3.0* 3.3* 3.5* 4.0  NEUTROABS 1.7  --  2.4  --   --   HGB 9.2* 7.3* 8.0* 7.7* 7.2*  HCT 27.6* 22.4* 23.8* 22.7* 22.4*  MCV 92.0 91.1 91.2 93.0 92.2  PLT 307 239 237 284 259   Basic Metabolic Panel: Recent Labs  Lab 12/20/19 1406  12/20/19 1433 12/21/19 0747 12/22/19 0203 12/23/19 0119 12/24/19 0107  NA 134*  --  133* 139 136 135  K 3.3*  --  3.3* 3.5 3.0* 3.4*  CL 99  --  102 106 104 104  CO2 25  --  23 25 24 24   GLUCOSE 109*  --  93 107* 124* 111*  BUN 12  --  9 5* <5* <5*  CREATININE 1.01  --  0.94 0.83 0.90 0.88  CALCIUM 8.3*  --  7.6* 8.1* 8.0* 7.9*  MG  --  1.4*  --  1.5* 1.9 1.3*   GFR: Estimated Creatinine Clearance: 71.3 mL/min (by C-G formula based on SCr of 0.88 mg/dL). Liver Function Tests: Recent Labs  Lab 12/20/19 1406 12/23/19 0119 12/24/19 0107  AST 26 41 36  ALT 12 15 15   ALKPHOS 46 38 36*  BILITOT 1.1 0.9 1.1  PROT 7.0 5.2* 5.0*  ALBUMIN 3.4* 2.3* 2.3*  No results for input(s): LIPASE, AMYLASE in the last 168 hours. No results for input(s): AMMONIA in the last 168 hours. Coagulation Profile: Recent Labs  Lab 12/20/19 1406  INR 1.2   Cardiac Enzymes: No results for input(s): CKTOTAL, CKMB, CKMBINDEX, TROPONINI in the last 168 hours. BNP (last 3 results) No results for input(s): PROBNP in the last 8760 hours. HbA1C: No results for input(s): HGBA1C in the last 72 hours. CBG: No results for input(s): GLUCAP in the last 168 hours. Lipid Profile: No results for input(s): CHOL, HDL, LDLCALC, TRIG, CHOLHDL, LDLDIRECT in the last 72 hours. Thyroid Function Tests: No results for input(s): TSH, T4TOTAL, FREET4, T3FREE, THYROIDAB in the last 72 hours. Anemia Panel: No results for input(s): VITAMINB12, FOLATE, FERRITIN, TIBC, IRON, RETICCTPCT in the last 72 hours.    Radiology Studies: I have reviewed all of the imaging during this hospital visit personally     Scheduled Meds: . acetaminophen  650 mg Oral Q6H  . enoxaparin (LOVENOX) injection  40 mg Subcutaneous Q24H  . feeding supplement  1 Container Oral TID BM  . fluticasone  2 spray Each Nare Daily  . guaiFENesin  600 mg Oral BID  . hydrocortisone cream   Topical BID  . pantoprazole (PROTONIX) IV  40 mg Intravenous  Q12H  . potassium chloride  10 mEq Oral TID  . sucralfate  1 g Oral TID WC & HS  . thiothixene  2 mg Oral TID   Continuous Infusions: . dextrose 5% lactated ringers 75 mL/hr at 12/24/19 0246  . fluconazole (DIFLUCAN) IV 200 mg (12/23/19 1515)     LOS: 3 days        Chandni Gagan Gerome Apley, MD

## 2019-12-25 ENCOUNTER — Other Ambulatory Visit: Payer: Self-pay

## 2019-12-25 ENCOUNTER — Inpatient Hospital Stay (HOSPITAL_COMMUNITY): Payer: No Typology Code available for payment source | Admitting: Anesthesiology

## 2019-12-25 ENCOUNTER — Encounter (HOSPITAL_COMMUNITY)
Admission: EM | Disposition: A | Discharge status: Home Health | Payer: Self-pay | Source: Home / Self Care | Attending: Internal Medicine

## 2019-12-25 ENCOUNTER — Encounter (HOSPITAL_COMMUNITY): Payer: Self-pay | Admitting: Family Medicine

## 2019-12-25 DIAGNOSIS — C3431 Malignant neoplasm of lower lobe, right bronchus or lung: Secondary | ICD-10-CM

## 2019-12-25 HISTORY — PX: BIOPSY: SHX5522

## 2019-12-25 HISTORY — PX: ESOPHAGOGASTRODUODENOSCOPY (EGD) WITH PROPOFOL: SHX5813

## 2019-12-25 LAB — CULTURE, BLOOD (ROUTINE X 2)
Culture: NO GROWTH
Special Requests: ADEQUATE

## 2019-12-25 LAB — BASIC METABOLIC PANEL
Anion gap: 9 (ref 5–15)
BUN: <5 mg/dL — ABNORMAL LOW (ref 8–23)
CO2: 25 mmol/L (ref 22–32)
Calcium: 8.4 mg/dL — ABNORMAL LOW (ref 8.9–10.3)
Chloride: 102 mmol/L (ref 98–111)
Creatinine, Ser: 0.93 mg/dL (ref 0.61–1.24)
GFR calc Af Amer: >60 mL/min (ref >60)
GFR calc non Af Amer: >60 mL/min (ref >60)
Glucose, Bld: 93 mg/dL (ref 70–99)
Potassium: 3.7 mmol/L (ref 3.5–5.1)
Sodium: 136 mmol/L (ref 135–145)

## 2019-12-25 LAB — MAGNESIUM: Magnesium: 1.7 mg/dL (ref 1.7–2.4)

## 2019-12-25 SURGERY — ESOPHAGOGASTRODUODENOSCOPY (EGD) WITH PROPOFOL
Anesthesia: Monitor Anesthesia Care

## 2019-12-25 MED ORDER — MAGNESIUM SULFATE 2 GM/50ML IV SOLN
2.0000 g | Freq: Once | INTRAVENOUS | Status: AC
  Administered 2019-12-25: 2 g via INTRAVENOUS
  Filled 2019-12-25: qty 50

## 2019-12-25 MED ORDER — PHENYLEPHRINE 40 MCG/ML (10ML) SYRINGE FOR IV PUSH (FOR BLOOD PRESSURE SUPPORT)
PREFILLED_SYRINGE | INTRAVENOUS | Status: DC | PRN
  Administered 2019-12-25: 80 ug via INTRAVENOUS

## 2019-12-25 MED ORDER — POTASSIUM CHLORIDE 10 MEQ/100ML IV SOLN
10.0000 meq | INTRAVENOUS | Status: AC
  Administered 2019-12-25 (×2): 10 meq via INTRAVENOUS
  Filled 2019-12-25 (×4): qty 100

## 2019-12-25 MED ORDER — ACETAMINOPHEN 650 MG RE SUPP: 650.0000 mg | RECTAL | Status: DC | PRN

## 2019-12-25 MED ORDER — SUCRALFATE 1 GM/10ML PO SUSP: 1.0000 g | Freq: Three times a day (TID) | ORAL | 0 refills | Status: DC

## 2019-12-25 MED ORDER — PANTOPRAZOLE SODIUM 40 MG PO PACK
40.0000 mg | PACK | Freq: Two times a day (BID) | ORAL | Status: DC
  Administered 2019-12-25 – 2019-12-27 (×5): 40 mg via ORAL
  Filled 2019-12-25 (×6): qty 20

## 2019-12-25 MED ORDER — PROPOFOL 10 MG/ML IV BOLUS
INTRAVENOUS | Status: DC | PRN
  Administered 2019-12-25: 100 mg via INTRAVENOUS
  Administered 2019-12-25: 30 mg via INTRAVENOUS
  Administered 2019-12-25: 40 mg via INTRAVENOUS

## 2019-12-25 MED ORDER — ACETAMINOPHEN 325 MG PO TABS: 650.0000 mg | ORAL_TABLET | Freq: Four times a day (QID) | ORAL | Status: DC | PRN

## 2019-12-25 MED ORDER — ACETAMINOPHEN 325 MG PO TABS
650.0000 mg | ORAL_TABLET | Freq: Four times a day (QID) | ORAL | Status: DC | PRN
  Administered 2019-12-25: 650 mg via ORAL
  Filled 2019-12-25: qty 2

## 2019-12-25 MED ORDER — LIDOCAINE 2% (20 MG/ML) 5 ML SYRINGE
INTRAMUSCULAR | Status: DC | PRN
  Administered 2019-12-25: 60 mg via INTRAVENOUS

## 2019-12-25 MED ORDER — LACTATED RINGERS IV SOLN: INTRAVENOUS | Status: DC | PRN

## 2019-12-25 MED ORDER — POTASSIUM CHLORIDE 10 MEQ/100ML IV SOLN
10.0000 meq | INTRAVENOUS | Status: AC
  Administered 2019-12-25 (×2): 10 meq via INTRAVENOUS

## 2019-12-25 SURGICAL SUPPLY — 15 items

## 2019-12-25 NOTE — Progress Notes (Signed)
Nutrition Follow-up  DOCUMENTATION CODES:   Not applicable  INTERVENTION:   -Continue Ensure Enlive po TID, each supplement provides 350 kcal and 20 grams of protein -Continue Magic cup TID with meals, each supplement provides 290 kcal and 9 grams of protein -Continue Hormel Shake TID with meals, each supplement provides 520 kcals and 22 grams protein -Continue MVI with minerals daily  NUTRITION DIAGNOSIS:   Inadequate oral intake related to dysphagia (painful swallowing) as evidenced by per patient/family report.  Ongoing  GOAL:   Patient will meet greater than or equal to 90% of their needs  Progressing   MONITOR:   PO intake, Supplement acceptance, Diet advancement, Labs, Weight trends, Skin, I & O's  REASON FOR ASSESSMENT:   Consult Assessment of nutrition requirement/status, Poor PO  ASSESSMENT:   Melvin Foley is a 74 y.o. male with medical history significant of NSCLC (adenocarcinoma of RLL of lung, currently on chemo and radiation), HTN, OSA, schizophrenia, COPD, BPD, L CEA 2019, prior stroke on plavix.  9/4- advanced to clear liquids 9/5- advanced to full liquids 9/8- s/p EGD- revealed radiation esophagitis (biospied); salmon-colored mucosa suspicious fore Barrett's esophagus (biospied); hiatal hernia; stomach biopsied  Reviewed I/O's: +1.1 L x 24 hours and +4.9 L since admission  UOP: 452 ml x 24 hours  Case discussed with RN, who reports pt has been resumed on full liquids and worked with PT earlier today (however, pt does not recall working with them).   Pt very sleepy at time of visit, but woke up for RD questioning. Pt shares she has experienced decreased oral intake due to painful swallowing. He feels a little better after EGD and reports pain has improved. Pt still experiences painful swallowing, but feels like the Magic Mouthwash and lidocaine help, but "it only lasts a few seconds". He has been able to consume some fruit juice and about 2/3 of  Ensure today.   Reviewed wt hx; pt has experienced a 7.3% wt loss over the past 3 months. While this is not significant for time frame, it is concerning given decreased oral intake and multiple co-morbidities. Per pt, he has lost about 25# over the past 1.5 years, which he reports has all been intentional.   Pt still resistant about feeding tube. Discussed importance of good meal and supplement intake to help support healing.   Labs reviewed.   NUTRITION - FOCUSED PHYSICAL EXAM:    Most Recent Value  Orbital Region No depletion  Upper Arm Region No depletion  Thoracic and Lumbar Region No depletion  Buccal Region No depletion  Temple Region No depletion  Clavicle and Acromion Bone Region Mild depletion  Scapular Bone Region No depletion  Dorsal Hand No depletion  Patellar Region No depletion  Anterior Thigh Region No depletion  Posterior Calf Region No depletion  Edema (RD Assessment) None  Hair Reviewed  Eyes Reviewed  Mouth Reviewed  Skin Reviewed  Nails Reviewed       Diet Order:   Diet Order            Diet full liquid Room service appropriate? Yes; Fluid consistency: Thin  Diet effective now                 EDUCATION NEEDS:   No education needs have been identified at this time  Skin:  Skin Assessment: Reviewed RN Assessment  Last BM:  12/22/19  Height:   Ht Readings from Last 1 Encounters:  12/20/19 5\' 8"  (1.727 m)  Weight:   Wt Readings from Last 1 Encounters:  12/20/19 68.9 kg    Ideal Body Weight:  70 kg  BMI:  Body mass index is 23.11 kg/m.  Estimated Nutritional Needs:   Kcal:  2050-2250  Protein:  105-120 grams  Fluid:  > 2 L    Loistine Chance, RD, LDN, [place] Registered Dietitian II Certified Diabetes Care and Education Specialist Please refer to [facility] for RD and/or RD on-call/weekend/after hours pager

## 2019-12-25 NOTE — Progress Notes (Signed)
PROGRESS NOTE    Melvin Foley  HDQ:[id number] DOB: Apr 26, 1945 DOA: 12/20/2019 PCP: Wallie Char, FNP    Brief Narrative:  Patient was admitted to the hospital working diagnosis of radiation esophagitis.  74 year old male with past medical history for adenocarcinoma of the right lower lung, currently on chemotherapyandradiation therapy. He also hashypertension, COPD, schizophrenia and obstructivesleep apnea. He had recent hospitalization 8/28-8/31st for neutropenic fever. Patient received Granix with improvement of leukopenia and he was discharged on Augmentin. At home patient developed persistent fever, ongoing cough and persistent difficulty swallowing due to severe odynophagia.On his initial physical examination blood pressure 109/72, 95/53, heart rate 77, respiratory rate 16, temperature 38.8 C, oxygen saturation 91%,her lungs are clear to auscultation bilaterally, heart S1-S2, present rhythmic, soft abdomen, no lower extremity edema. Sodium 134, potassium 3.3, chloride 99, bicarb 25, glucose 109, BUN 12, creatinine 1.0, magnesium 1.4, AST 26, ALT 12, lactic acid 2.8, white count 3.4, hemoglobin 9.2, hematocrit 27.6, platelets 307.SARS COVID-19 negative. Urinalysis specific gravity 1.020,negative leukocytes negative nitrates. CT chest negative for pulmonary embolism, persistent right lower lobe mass, 3.9 x 3.3 cm. Chest radiograph with right lower lobe mass. EKG 89 bpm, left axis deviation with left anterior fascicular block, sinus rhythm, no ST segment or T wave changes.  Patient has been placed on antiacid therapy with good toleration. Continue slow to respond, he will need to continue full liquid diet after his discharge.  Continue to have pain and using IV hydromorphone. patient underwent upper endoscopy today, showing LA grade D circumferential radiation esophagitis with no bleeding. Short segment of suspected Barrett's esophagus.     Assessment & Plan:     Principal Problem:   Radiation esophagitis Active Problems:   Carotid stenosis   Fever   Primary adenocarcinoma of lower lobe of right lung (HCC)   Bipolar disorder ([place])   1. Radiation esophagitis. He completed radiation therapy 7 days prior hospitalization.  EGD today confirmed radiation esophagitis.   Recommendations for aggressive medical therapy with antiacid: sucralfate, pantoprazole (change from IV to liquid formulation) and lidocaine viscus.  Continue pain control with hydrocodone and as needed hydromorphone (reduced frequency) with good toleration.   Will complete 7 days of fluconazole for suspected candida esophagitis.   Continue with full liquid diet, if good toleration will plan for possible dc home in am.    2. Recovering neutropenic fever. patient has been afebrile and neutropenia has resolved.   3. Dehydration with hypokalemia/ hyponatremia/ hypomagensemia. Stable renal function with serum cr at 0,93, continue to have low K at 3,7 and Mg at 1,7, will continue K correction with Kcl IV 40 meq and will add 2 g Mag sulfate.   Follow up on renal function and electrolytes in am. Continue with full liquid diet.   4. 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 Iron deficiency. Patient follows up at [facility], old records personally reviewed. Sp one dose of IV iron.  Follow as outpatient.  5. Schizophrenia. No acute decompensation, on Thiotixene     Status is: Inpatient  Remains inpatient appropriate because:IV treatments appropriate due to intensity of illness or inability to take PO and Inpatient level of care appropriate due to severity of illness   Dispo: The patient is from: Home              Anticipated d/c is to: Home              Anticipated d/c date is: 1 day  Patient currently is not medically stable to d/c.   DVT prophylaxis: Enoxaparin   Code Status:   dnr   Family Communication:  Unable to reach  his wife over the phone, I left a message.     Nutrition Status: Nutrition Problem: Inadequate oral intake Etiology: dysphagia (painful swallowing) Signs/Symptoms: per patient/family report Interventions: Ensure Enlive (each supplement provides 350kcal and 20 grams of protein), MVI, Hormel Shake, Magic cup     Consultants:   GI   Procedures:   EGD    Subjective: Patient is feeling better, his chest pain is improved with analgesics, by the time of my visit he has not eat yet. No nausea or vomiting, today.   Objective: Vitals:   12/25/19 1150 12/25/19 1200 12/25/19 1210 12/25/19 1225  BP: 119/72 124/80 129/78 113/80  Pulse: 88 88 91 88  Resp: (!) 29 15 18 17   Temp:    98.3 F (36.8 C)  TempSrc:      SpO2: 96% 96% 97% 90%  Weight:      Height:        Intake/Output Summary (Last 24 hours) at 12/25/2019 1338 Last data filed at 12/25/2019 1139 Gross per 24 hour  Intake 1594 ml  Output 851 ml  Net 743 ml   Filed Weights   12/20/19 1348  Weight: 68.9 kg    Examination:   General: Not in pain or dyspnea. Deconditioned  Neurology: Awake and alert, non focal  E ENT: mild  pallor, no icterus, oral mucosa moist Cardiovascular: No JVD. S1-S2 present, rhythmic, no gallops, rubs, or murmurs. No lower extremity edema. Pulmonary: positive breath sounds bilaterally, adequate air movement, no wheezing, rhonchi or rales. Gastrointestinal. Abdomen soft and non tender Skin. No rashes Musculoskeletal: no joint deformities     Data Reviewed: I have personally reviewed following labs and imaging studies  CBC: Recent Labs  Lab 12/20/19 1406 12/21/19 0747 12/21/19 1457 12/23/19 0119 12/24/19 0107  WBC 3.4* 3.0* 3.3* 3.5* 4.0  NEUTROABS 1.7  --  2.4  --   --   HGB 9.2* 7.3* 8.0* 7.7* 7.2*  HCT 27.6* 22.4* 23.8* 22.7* 22.4*  MCV 92.0 91.1 91.2 93.0 92.2  PLT 307 239 237 284 450   Basic Metabolic Panel: Recent Labs  Lab 12/20/19 1406 12/20/19 1433 12/21/19 0747  12/22/19 0203 12/23/19 0119 12/24/19 0107 12/25/19 0427  NA   < >  --  133* 139 136 135 136  K   < >  --  3.3* 3.5 3.0* 3.4* 3.7  CL   < >  --  102 106 104 104 102  CO2   < >  --  23 25 24 24 25   GLUCOSE   < >  --  93 107* 124* 111* 93  BUN   < >  --  9 5* <5* <5* <5*  CREATININE   < >  --  0.94 0.83 0.90 0.88 0.93  CALCIUM   < >  --  7.6* 8.1* 8.0* 7.9* 8.4*  MG  --  1.4*  --  1.5* 1.9 1.3* 1.7   < > = values in this interval not displayed.   GFR: Estimated Creatinine Clearance: 67.4 mL/min (by C-G formula based on SCr of 0.93 mg/dL). Liver Function Tests: Recent Labs  Lab 12/20/19 1406 12/23/19 0119 12/24/19 0107  AST 26 41 36  ALT 12 15 15   ALKPHOS 46 38 36*  BILITOT 1.1 0.9 1.1  PROT 7.0 5.2* 5.0*  ALBUMIN 3.4* 2.3*  2.3*   No results for input(s): LIPASE, AMYLASE in the last 168 hours. No results for input(s): AMMONIA in the last 168 hours. Coagulation Profile: Recent Labs  Lab 12/20/19 1406  INR 1.2   Cardiac Enzymes: No results for input(s): CKTOTAL, CKMB, CKMBINDEX, TROPONINI in the last 168 hours. BNP (last 3 results) No results for input(s): PROBNP in the last 8760 hours. HbA1C: No results for input(s): HGBA1C in the last 72 hours. CBG: No results for input(s): GLUCAP in the last 168 hours. Lipid Profile: No results for input(s): CHOL, HDL, LDLCALC, TRIG, CHOLHDL, LDLDIRECT in the last 72 hours. Thyroid Function Tests: No results for input(s): TSH, T4TOTAL, FREET4, T3FREE, THYROIDAB in the last 72 hours. Anemia Panel: Recent Labs    12/24/19 0846  VITAMINB12 805  FOLATE 6.7  FERRITIN 661*  TIBC 161*  IRON 26*      Radiology Studies: I have reviewed all of the imaging during this hospital visit personally     Scheduled Meds: . [START ON 12/26/2019] enoxaparin (LOVENOX) injection  40 mg Subcutaneous Q24H  . feeding supplement (ENSURE ENLIVE)  237 mL Oral TID BM  . fluticasone  2 spray Each Nare Daily  . guaiFENesin  600 mg Oral BID  .  hydrocortisone cream   Topical BID  . lidocaine  15 mL Mouth/Throat TID AC & HS  . multivitamin with minerals  1 tablet Oral Daily  . pantoprazole (PROTONIX) IV  40 mg Intravenous Q12H  . sucralfate  1 g Oral TID WC & HS  . thiothixene  2 mg Oral TID   Continuous Infusions: . fluconazole (DIFLUCAN) IV 200 mg (12/25/19 1235)     LOS: 4 days        Zamier Eggebrecht Gerome Apley, MD

## 2019-12-25 NOTE — Progress Notes (Signed)
Physical Therapy Treatment Patient Details Name: Melvin Foley MRN: [id number] DOB: 04/22/1945 Today's Date: 12/25/2019    History of Present Illness Pt is a 74 y/o male admitted secondary to increased SOB and severe dysphagia and odynophagia. 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 S/p EGD 9/8. PMH includes lung cancer, HTN, COPD, schizophrenia. Of note, recent hospitalization 8/28-8/31/21 for neutropenic fever.   PT Comments    Pt limited by fatigue and decreased activity tolerance this session. Mobilizing well with RW at supervision-level, declining further mobility secondary to pain. Educ re: importance of mobility, BLE therex. Will continue to follow acutely.    Follow Up Recommendations  No PT follow up;Supervision for mobility/OOB (declined HHPT)     Equipment Recommendations  Rolling walker with 5" wheels    Recommendations for Other Services       Precautions / Restrictions Precautions Precautions: Fall Restrictions Weight Bearing Restrictions: No    Mobility  Bed Mobility Overal bed mobility: Modified Independent Bed Mobility: Supine to Sit;Sit to Supine              Transfers Overall transfer level: Needs assistance Equipment used: Rolling walker (2 wheeled) Transfers: Sit to/from Stand Sit to Stand: Supervision         General transfer comment: Able to stand from bed and low toilet height to RW, reliant on momentum; supervision for safety  Ambulation/Gait Ambulation/Gait assistance: Supervision Gait Distance (Feet): 36 Feet Assistive device: Rolling walker (2 wheeled) Gait Pattern/deviations: Step-through pattern;Decreased stride length;Trunk flexed Gait velocity: Decreased   General Gait Details: Only agreeable to ambulation to/from bathroom; mostly steady gait with RW at [place] Mobility    Modified Rankin (Stroke Patients Only)       Balance Overall balance assessment: Needs  assistance Sitting-balance support: Feet supported;No upper extremity supported Sitting balance-Leahy Scale: Good     Standing balance support: Bilateral upper extremity supported;During functional activity Standing balance-Leahy Scale: Fair Standing balance comment: Can static stand without UE support; dynamic stability improved with RW                            Cognition Arousal/Alertness: Awake/alert Behavior During Therapy: Flat affect Overall Cognitive Status: Within Functional Limits for tasks assessed                                 General Comments: WFL for simple tasks, although flat affect and not much interested in participating; endorses fatigue      Exercises General Exercises - Lower Extremity Heel Slides: AROM;Both;Supine Hip ABduction/ADduction: AROM;Both;Supine Straight Leg Raises: AROM;Both;Supine    General Comments General comments (skin integrity, edema, etc.): Discussed recomendation for RW for initial home use, pt in agreement      Pertinent Vitals/Pain Pain Assessment: Faces Faces Pain Scale: Hurts little more Pain Location: Abdomen Pain Descriptors / Indicators: Discomfort Pain Intervention(s): Monitored during session    Home Living                      Prior Function            PT Goals (current goals can now be found in the care plan section) Progress towards PT goals: Progressing toward goals    Frequency    Min 3X/week      PT Plan Current plan remains  appropriate    Co-evaluation              AM-PAC PT "6 Clicks" Mobility   Outcome Measure  Help needed turning from your back to your side while in a flat bed without using bedrails?: None Help needed moving from lying on your back to sitting on the side of a flat bed without using bedrails?: None Help needed moving to and from a bed to a chair (including a wheelchair)?: None Help needed standing up from a chair using your arms (e.g.,  wheelchair or bedside chair)?: None Help needed to walk in hospital room?: A Little Help needed climbing 3-5 steps with a railing? : A Little 6 Click Score: 22    End of Session   Activity Tolerance: Patient limited by fatigue Patient left: in bed;with call bell/phone within reach;with bed alarm set Nurse Communication: Mobility status PT Visit Diagnosis: Unsteadiness on feet (R26.81);Muscle weakness (generalized) (M62.81)     Time: 1464-3142 PT Time Calculation (min) (ACUTE ONLY): 14 min  Charges:  $Therapeutic Exercise: 8-22 mins                    Mabeline Caras, PT, DPT Acute Rehabilitation Services  Pager [phone] Office [place] 12/25/2019, 5:17 PM

## 2019-12-25 NOTE — Progress Notes (Signed)
OT Cancellation Note  Patient Details Name: JMARI PELC MRN: [id number] DOB: 18-Apr-1946   Cancelled Treatment:    Reason Eval/Treat Not Completed: Patient at procedure or test/ unavailable (Endoscopy)  Hercules Hasler D Amiliah Campisi 12/25/2019, 11:19 AM  12/25/2019  Denice Paradise, OTR/L  Acute Rehabilitation Services  Office:  [phone]

## 2019-12-25 NOTE — Anesthesia Postprocedure Evaluation (Signed)
Anesthesia Post Note  Patient: Melvin Foley  Procedure(s) Performed: ESOPHAGOGASTRODUODENOSCOPY (EGD) WITH PROPOFOL (N/A ) BIOPSY     Patient location during evaluation: Endoscopy Anesthesia Type: MAC Level of consciousness: awake and alert Pain management: pain level controlled Vital Signs Assessment: post-procedure vital signs reviewed and stable Respiratory status: spontaneous breathing, nonlabored ventilation, respiratory function stable and patient connected to nasal cannula oxygen Cardiovascular status: blood pressure returned to baseline and stable Postop Assessment: no apparent nausea or vomiting Anesthetic complications: no   No complications documented.  Last Vitals:  Vitals:   12/25/19 1225 12/25/19 1525  BP: 113/80 102/73  Pulse: 88 81  Resp: 17   Temp: 36.8 C 36.9 C  SpO2: 90% 96%    Last Pain:  Vitals:   12/25/19 1530  TempSrc:   PainSc: 0-No pain                 Barnet Glasgow

## 2019-12-25 NOTE — Op Note (Signed)
[facility] Patient Name: Melvin Foley Procedure Date : 12/25/2019 MRN: [id number] Attending MD: Justice Britain , MD Date of Birth: [birth year] CSN: [id number] Age: 74 Admit Type: Inpatient Procedure:                Upper GI endoscopy Indications:              Dysphagia, Odynophagia, History of Radiation                            Treatment concern for Acute Radiation Esophagitis Providers:                Justice Britain, MD, Clyde Lundborg, RN, Elspeth Cho Tech., Technician, Vania Rea, CRNA Referring MD:             [facility] Medicines:                Monitored Anesthesia Care Complications:            No immediate complications. Estimated Blood Loss:     Estimated blood loss was minimal. Procedure:                Pre-Anesthesia Assessment:                           - Prior to the procedure, a History and Physical                            was performed, and patient medications and                            allergies were reviewed. The patient's tolerance of                            previous anesthesia was also reviewed. The risks                            and benefits of the procedure and the sedation                            options and risks were discussed with the patient.                            All questions were answered, and informed consent                            was obtained. Prior Anticoagulants: The patient has                            taken Lovenox (enoxaparin), last dose was 2 days                            prior to procedure. ASA Grade Assessment: III - A  patient with severe systemic disease. After                            reviewing the risks and benefits, the patient was                            deemed in satisfactory condition to undergo the                            procedure.                           After obtaining informed consent, the endoscope was                             passed under direct vision. Throughout the                            procedure, the patient's blood pressure, pulse, and                            oxygen saturations were monitored continuously. The                            GIF-H190 ([id number] Olympus gastroscope was                            introduced through the mouth, and advanced to the                            second part of duodenum. The upper GI endoscopy was                            accomplished without difficulty. The patient                            tolerated the procedure. Scope In: Scope Out: Findings:      LA Grade D (one or more mucosal breaks involving at least 75% of       esophageal circumference) esophagitis with no bleeding was found 29 to       33 cm from the incisors. This was biopsied with a cold forceps for       histology.      Two tongues of salmon-colored mucosa were present from 36 to 38 cm. No       other visible abnormalities were present. Biopsies were taken with a       cold forceps for histology to rule out Barrett's.      No other gross mucosal lesions were noted in the entire esophagus.      A 2 cm hiatal hernia was found. The proximal extent of the gastric folds       (end of tubular esophagus) was 38 cm from the incisors. The hiatal       narrowing was 40 cm from the incisors. The Z-line was 38 cm from the       incisors.  Localized mildly erythematous mucosa without bleeding was found in the       gastric antrum.      No other gross lesions were noted in the entire examined stomach.       Biopsies were taken with a cold forceps for histology and Helicobacter       pylori testing.      No gross lesions were noted in the duodenal bulb, in the first portion       of the duodenum and in the second portion of the duodenum. Impression:               - LA Grade D circumferential likely radiation                            esophagitis with no bleeding. Biopsied.                            - Salmon-colored mucosa suspicious for                            short-segment Barrett's esophagus. Biopsied.                           - No other gross lesions in esophagus.                           - 2 cm hiatal hernia.                           - Erythematous mucosa in the antrum. No other gross                            lesions in the stomach. Biopsied.                           - No gross lesions in the duodenal bulb, in the                            first portion of the duodenum and in the second                            portion of the duodenum. Recommendation:           - The patient will be observed post-procedure,                            until all discharge criteria are met.                           - Return patient to hospital ward for ongoing care.                           - Full liquid diet.                           - Observe patient's clinical course.                           -  Continue IV Protonix BID until patient able to                            take Pills and then can transition to PO PPI BID 40                            mg and maintain for at least next 94-month.                           - Continue Liquid Lidocaine Swallow with each meal                            and at bedtime.                           - Continue Carafate 1 g TID AC and QHS.                           - Consider if unable to tolerate Pill pain                            medications conversion to Liquid oral pain                            medications.                           - Await pathology results.                           - May complete a 7-day coure of Fluconazole                            (nothing found today but could have been partially                            treated).                           - Advancement of diet based on overall Nutritional                            caloric counts and intake.                           - If not meeting  criteria for nutritional needs,                            then Cortrak placement can be considered. I think                            this should be entertained before placement of a                            PEG  tube, because hopefully with more time the                            region will heal. Certainly, PEG tube placement                            could be considered by our Interventional Radiology                            colleagues, but hopefully this can be held off on                            (patient does not want as well if possible).                           - No further therapies are available from a GI                            standpoint (he is maximized on all available                            therapies currently). GI Inpatient will signoff but                            please call with questions.                           - Usually would recommend a repeat EGD in 50-month                            to re-evaluate healing of esophagitis, and also to                            reconfirm if Barrett's esophagus is found. The                            patient may have this done locally with [place]                            or with Dr. SFuller Planor myself at that time should                            [facility] referral be necessary. Procedure Code(s):        --- Professional ---                           [phone] Esophagogastroduodenoscopy, flexible,                            transoral; with biopsy, single or multiple Diagnosis Code(s):        --- Professional ---  T66.XXXA, Radiation sickness, unspecified, initial                            encounter                           K20.80, Other esophagitis without bleeding                           K22.8, Other specified diseases of esophagus                           K44.9, Diaphragmatic hernia without obstruction or                            gangrene                            K31.89, Other diseases of stomach and duodenum                           R13.10, Dysphagia, unspecified CPT copyright 2019 American [employer] Association. All rights reserved. The codes documented in this report are preliminary and upon coder review may  be revised to meet current compliance requirements. Justice Britain, MD 12/25/2019 11:51:48 AM Number of Addenda: 0

## 2019-12-25 NOTE — Transfer of Care (Signed)
Immediate Anesthesia Transfer of Care Note  Patient: Kongmeng Santoro Ems  Procedure(s) Performed: ESOPHAGOGASTRODUODENOSCOPY (EGD) WITH PROPOFOL (N/A ) BIOPSY  Patient Location: Endoscopy Unit  Anesthesia Type:MAC  Level of Consciousness: drowsy  Airway & Oxygen Therapy: Patient Spontanous Breathing and Patient connected to nasal cannula oxygen  Post-op Assessment: Report given to RN and Post -op Vital signs reviewed and stable  Post vital signs: Reviewed and stable  Last Vitals:  Vitals Value Taken Time  BP 125/79 12/25/19 1138  Temp    Pulse 87 12/25/19 1139  Resp 32 12/25/19 1139  SpO2 99 % 12/25/19 1139  Vitals shown include unvalidated device data.  Last Pain:  Vitals:   12/25/19 1035  TempSrc: Oral  PainSc: 4       Patients Stated Pain Goal: 0 (17/71/16 5790)  Complications: No complications documented.

## 2019-12-25 NOTE — Progress Notes (Signed)
PT Cancellation Note  Patient Details Name: Melvin Foley MRN: [id number] DOB: Apr 26, 1945   Cancelled Treatment:    Reason Eval/Treat Not Completed: Patient at procedure or test/unavailable (endoscopy). Will follow-up for PT treatment as schedule permits.  Mabeline Caras, PT, DPT Acute Rehabilitation Services  Pager [phone] Office [place] 12/25/2019, 10:01 AM

## 2019-12-25 NOTE — Progress Notes (Signed)
   12/25/19 1815  Assess: MEWS Score  Temp (!) 100.7 F (38.2 C)  BP 121/74  Pulse Rate 98  Resp (!) 28  Level of Consciousness Responds to Voice  SpO2 94 %  O2 Device Room Air  Assess: MEWS Score  MEWS Temp 1  MEWS Systolic 0  MEWS Pulse 0  MEWS RR 2  MEWS LOC 1  MEWS Score 4  MEWS Score Color Red  Assess: if the MEWS score is Yellow or Red  Were vital signs taken at a resting state? Yes  Focused Assessment No change from prior assessment  Early Detection of Sepsis Score *See Row Information* Low  MEWS guidelines implemented *See Row Information* Yes  Treat  MEWS Interventions Escalated (See documentation below)  Pain Scale 0-10  Pain Score 0  Take Vital Signs  Increase Vital Sign Frequency  Red: Q 1hr X 4 then Q 4hr X 4, if remains red, continue Q 4hrs  Escalate  MEWS: Escalate Red: discuss with charge nurse/RN and provider, consider discussing with RRT  Notify: Charge Nurse/RN  Name of Charge Nurse/RN Notified self  Date Charge Nurse/RN Notified 12/25/19  Time Charge Nurse/RN Notified 0240  Notify: Provider  Provider Name/Title Dr. Cathlean Sauer  Date Provider Notified 12/25/19  Time Provider Notified 9735  Notification Type Page  Document  Patient Outcome Stabilized after interventions

## 2019-12-25 NOTE — Anesthesia Procedure Notes (Signed)
Procedure Name: MAC Date/Time: 12/25/2019 11:25 AM Performed by: Orlie Dakin, CRNA Pre-anesthesia Checklist: Patient identified, Emergency Drugs available, Suction available and Patient being monitored Patient Re-evaluated:Patient Re-evaluated prior to induction Oxygen Delivery Method: Nasal cannula Preoxygenation: Pre-oxygenation with 100% oxygen Induction Type: IV induction Placement Confirmation: positive ETCO2

## 2019-12-25 NOTE — Care Management (Addendum)
Entered benefit check for Sucralfate ( carafate) 1 gram/10 ml suspension, TID and at bedtime for  120 doses.  [employer] covers patient medications . VA would have to approve prescriptions. NCM will call [facility].   Called [place], spoke to [place] . Patient's [facility] P[facility] is Dr Helene Kelp. Explained patient needing prescription for Carafate to be covered. Diane instructed NCM to fax clinicals and prescription to Dr Helene Kelp at [phone]. Same done.   Magdalen Spatz RN

## 2019-12-26 ENCOUNTER — Encounter (HOSPITAL_COMMUNITY): Payer: Self-pay | Admitting: Gastroenterology

## 2019-12-26 LAB — BASIC METABOLIC PANEL
Anion gap: 9 (ref 5–15)
BUN: <5 mg/dL — ABNORMAL LOW (ref 8–23)
CO2: 24 mmol/L (ref 22–32)
Calcium: 8.2 mg/dL — ABNORMAL LOW (ref 8.9–10.3)
Chloride: 103 mmol/L (ref 98–111)
Creatinine, Ser: 0.91 mg/dL (ref 0.61–1.24)
GFR calc Af Amer: >60 mL/min (ref >60)
GFR calc non Af Amer: >60 mL/min (ref >60)
Glucose, Bld: 102 mg/dL — ABNORMAL HIGH (ref 70–99)
Potassium: 3.9 mmol/L (ref 3.5–5.1)
Sodium: 136 mmol/L (ref 135–145)

## 2019-12-26 MED ORDER — FLUCONAZOLE 100 MG PO TABS
200.0000 mg | ORAL_TABLET | Freq: Every day | ORAL | Status: AC
  Administered 2019-12-26 – 2019-12-27 (×2): 200 mg via ORAL
  Filled 2019-12-26 (×2): qty 2

## 2019-12-26 NOTE — Progress Notes (Signed)
PROGRESS NOTE    Melvin Foley  IRS:[id number] DOB: [birth year] DOA: 12/20/2019 PCP: Wallie Char, FNP    Brief Narrative:  Patient was admitted to the hospital working diagnosis of radiation esophagitis.  74 year old male with past medical history for adenocarcinoma of the right lower lung, currently on chemotherapyandradiation therapy. He also hashypertension, COPD, schizophrenia and obstructivesleep apnea. He had recent hospitalization 8/28-8/31st for neutropenic fever. Patient received Granix with improvement of leukopenia and he was discharged on Augmentin. At home patient developed persistent fever, ongoing cough and persistent difficulty swallowing due to severe odynophagia.On his initial physical examination blood pressure 109/72, 95/53, heart rate 77, respiratory rate 16, temperature 38.8 C, oxygen saturation 91%,her lungs are clear to auscultation bilaterally, heart S1-S2, present rhythmic, soft abdomen, no lower extremity edema. Sodium 134, potassium 3.3, chloride 99, bicarb 25, glucose 109, BUN 12, creatinine 1.0, magnesium 1.4, AST 26, ALT 12, lactic acid 2.8, white count 3.4, hemoglobin 9.2, hematocrit 27.6, platelets 307.SARS COVID-19 negative. Urinalysis specific gravity 1.020,negative leukocytes negative nitrates. CT chest negative for pulmonary embolism, persistent right lower lobe mass, 3.9 x 3.3 cm. Chest radiograph with right lower lobe mass. EKG 89 bpm, left axis deviation with left anterior fascicular block, sinus rhythm, no ST segment or T wave changes.  Patient has been placed on antiacid therapy with good toleration. Continue slow to respond, he will need to continue full liquid diet after his discharge.  Continue to have pain and using IV hydromorphone.patient underwent upper endoscopy today, showing LA grade D circumferential radiation esophagitis with no bleeding. 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 Radiation esophagitis.Hecompleted radiation therapy 7 daysprior hospitalization.  EGD with positive esophagitis, healing.    Continue with sucralfate, pantoprazole and lidocaine viscus. Pain has improved, with less need for IV prn hydromorphone. This am patient is requesting to eat soft diet. Social work consulted in order to continue sucralfate as outpatient.   Will trial soft diet if pain reoccurs, will change to full liquids. Continue pain control with hydrocodone and as needed hydromorphone. Plan to complete 7 days of fluconazole, will change to oral formulation.   Follow up with GI as outpatient.    2. Recovering neutropenic fever.clinically resolved  3. Dehydration with hypokalemia/ hyponatremia/ hypomagensemia. Improved electrolytes with Na at 136, K at 3,9 and serum bicarbonate at 24.   Trial of soft diet today. Continue to hold on IV fluids.   4. 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 Iron deficiency.Patient follows up at [facility], old records personally reviewed. Sp one dose of IV iron.   Plan to follow with oncology as outpatient.   5. Schizophrenia.OnThiotixene    Status is: Inpatient  Remains inpatient appropriate because:Inpatient level of care appropriate due to severity of illness   Dispo: The patient is from: Home              Anticipated d/c is to: Home              Anticipated d/c date is: 1 day              Patient currently is not medically stable to d/c.   DVT prophylaxis: Enoxaparin   Code Status:    dnr   Family Communication:  Unable to reach his wife over the phone, I left a massage.      Nutrition Status: Nutrition Problem: Inadequate oral intake Etiology: dysphagia (painful swallowing) Signs/Symptoms: per  patient/family report Interventions: Ensure Enlive (each supplement provides 350kcal and 20 grams of protein), MVI, Hormel Shake, Magic cup     Skin Documentation:     Consultants:   GI   Procedures:   Endoscopy    Subjective: Patient is feeling better, his chest pain and odynophagia have improved, no nausea or vomiting, no dyspnea. This am is out of bed to chair and requesting soft diet.   Objective: Vitals:   12/26/19 0240 12/26/19 0636 12/26/19 0749 12/26/19 0800  BP: 122/85 112/70  105/68  Pulse: 77 89 80   Resp: 16 16    Temp: 98.2 F (36.8 C) 99.6 F (37.6 C)  98.2 F (36.8 C)  TempSrc: Oral Oral  Axillary  SpO2: 91% 96% 95%   Weight:      Height:        Intake/Output Summary (Last 24 hours) at 12/26/2019 1006 Last data filed at 12/26/2019 0636 Gross per 24 hour  Intake 420 ml  Output 1750 ml  Net -1330 ml   Filed Weights   12/20/19 1348  Weight: 68.9 kg    Examination:   General: Not in pain or dyspnea. Deconditioned  Neurology: Awake and alert, non focal  E ENT: mild pallor, no icterus, oral mucosa moist Cardiovascular: No JVD. S1-S2 present, rhythmic, no gallops, rubs, or murmurs. No lower extremity edema. Pulmonary: positive breath sounds bilaterally, adequate air movement, no wheezing, rhonchi or rales. Gastrointestinal. Abdomen soft and non tender Skin. No rashes Musculoskeletal: no joint deformities     Data Reviewed: I have personally reviewed following labs and imaging studies  CBC: Recent Labs  Lab 12/20/19 1406 12/21/19 0747 12/21/19 1457 12/23/19 0119 12/24/19 0107  WBC 3.4* 3.0* 3.3* 3.5* 4.0  NEUTROABS 1.7  --  2.4  --   --   HGB 9.2* 7.3* 8.0* 7.7* 7.2*  HCT 27.6* 22.4* 23.8* 22.7* 22.4*  MCV 92.0 91.1 91.2 93.0 92.2  PLT 307 239 237 284 867   Basic Metabolic Panel: Recent Labs  Lab 12/20/19 1433 12/21/19 0747 12/22/19 0203 12/23/19 0119 12/24/19 0107 12/25/19 0427 12/26/19 0604  NA  --    < > 139 136 135 136  136  K  --    < > 3.5 3.0* 3.4* 3.7 3.9  CL  --    < > 106 104 104 102 103  CO2  --    < > 25 24 24 25 24   GLUCOSE  --    < > 107* 124* 111* 93 102*  BUN  --    < > 5* <5* <5* <5* <5*  CREATININE  --    < > 0.83 0.90 0.88 0.93 0.91  CALCIUM  --    < > 8.1* 8.0* 7.9* 8.4* 8.2*  MG 1.4*  --  1.5* 1.9 1.3* 1.7  --    < > = values in this interval not displayed.   GFR: Estimated Creatinine Clearance: 68.9 mL/min (by C-G formula based on SCr of 0.91 mg/dL). Liver Function Tests: Recent Labs  Lab 12/20/19 1406 12/23/19 0119 12/24/19 0107  AST 26 41 36  ALT 12 15 15   ALKPHOS 46 38 36*  BILITOT 1.1 0.9 1.1  PROT 7.0 5.2* 5.0*  ALBUMIN 3.4* 2.3* 2.3*   No results for input(s): LIPASE, AMYLASE in the last  168 hours. No results for input(s): AMMONIA in the last 168 hours. Coagulation Profile: Recent Labs  Lab 12/20/19 1406  INR 1.2   Cardiac Enzymes: No results for input(s): CKTOTAL, CKMB, CKMBINDEX, TROPONINI in the last 168 hours. BNP (last 3 results) No results for input(s): PROBNP in the last 8760 hours. HbA1C: No results for input(s): HGBA1C in the last 72 hours. CBG: No results for input(s): GLUCAP in the last 168 hours. Lipid Profile: No results for input(s): CHOL, HDL, LDLCALC, TRIG, CHOLHDL, LDLDIRECT in the last 72 hours. Thyroid Function Tests: No results for input(s): TSH, T4TOTAL, FREET4, T3FREE, THYROIDAB in the last 72 hours. Anemia Panel: Recent Labs    12/24/19 0846  VITAMINB12 805  FOLATE 6.7  FERRITIN 661*  TIBC 161*  IRON 26*      Radiology Studies: I have reviewed all of the imaging during this hospital visit personally     Scheduled Meds: . enoxaparin (LOVENOX) injection  40 mg Subcutaneous Q24H  . feeding supplement (ENSURE ENLIVE)  237 mL Oral TID BM  . fluticasone  2 spray Each Nare Daily  . guaiFENesin  600 mg Oral BID  . hydrocortisone cream   Topical BID  . lidocaine  15 mL Mouth/Throat TID AC & HS  . multivitamin with minerals   1 tablet Oral Daily  . pantoprazole sodium  40 mg Oral BID  . sucralfate  1 g Oral TID WC & HS  . thiothixene  2 mg Oral TID   Continuous Infusions: . fluconazole (DIFLUCAN) IV Stopped (12/25/19 1335)     LOS: 5 days        Ciarra Braddy Gerome Apley, MD

## 2019-12-26 NOTE — Evaluation (Signed)
Occupational Therapy Evaluation Patient Details Name: Melvin Foley MRN: [id number] DOB: 25-Apr-1945 Today's Date: 12/26/2019    History of Present Illness Pt is a 74 y/o male admitted secondary to increased SOB and severe dysphagia and odynophagia. 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 S/p EGD 9/8. PMH includes lung cancer, HTN, COPD, schizophrenia. Of note, recent hospitalization 8/28-8/31/21 for neutropenic fever.   Clinical Impression   Patient presents with generalized weakness, declines to dynamic stand balance, and declines to activity tolerance compared to stated prior level of function.  Before admitting to the hospital he was independent with all care and mobility.  He has not driven in about a month.  Currently he is needing a little bit of help with basic mobility, primarily due to the IV, and condom cath; and the patient needs some assistance with LB self care, due to the deficits listed above.  OT will follow in the acute setting to improve functional status.  The patient is not interested in [facility] services, OT will monitor for changes, and adjust recommendations appropriately.       Follow Up Recommendations  No OT follow up    Equipment Recommendations       Recommendations for Other Services       Precautions / Restrictions Precautions Precautions: Fall Precaution Comments: Burn to R shulder blade Restrictions Weight Bearing Restrictions: No      Mobility Bed Mobility   Bed Mobility: Rolling;Sidelying to Sit Rolling: Modified independent (Device/Increase time) Sidelying to sit: Supervision       General bed mobility comments: Supervision for safety and line management.   Transfers Overall transfer level: Needs assistance Equipment used: None Transfers: Stand Pivot Transfers Sit to Stand: Supervision Stand pivot transfers: Min guard            Balance   Sitting-balance support: Feet supported Sitting balance-Leahy Scale: Good     Standing  balance support: No upper extremity supported Standing balance-Leahy Scale: Fair                             ADL either performed or assessed with clinical judgement   ADL Overall ADL's : Needs assistance/impaired Eating/Feeding: Independent;Sitting       Upper Body Bathing: Set up;Sitting   Lower Body Bathing: Minimal assistance   Upper Body Dressing : Set up Upper Body Dressing Details (indicate cue type and reason): R arm buttoned after donning - IV Lower Body Dressing: Minimal assistance   Toilet Transfer: Min guard   Toileting- Clothing Manipulation and Hygiene: Min guard       Functional mobility during ADLs: Min guard       Vision Baseline Vision/History: Wears glasses Wears Glasses: Reading only;Distance only Patient Visual Report: No change from baseline Vision Assessment?: No apparent visual deficits     Perception     Praxis      Pertinent Vitals/Pain Faces Pain Scale: Hurts a little bit Pain Location: mid chest Pain Descriptors / Indicators: Nagging Pain Intervention(s): Monitored during session     Hand Dominance Right   Extremity/Trunk Assessment Upper Extremity Assessment Upper Extremity Assessment: LUE deficits/detail LUE Deficits / Details: decreased forward flexion to R shoulder - chronic LUE Sensation: WNL LUE Coordination: WNL   Lower Extremity Assessment Lower Extremity Assessment: Defer to PT evaluation   Cervical / Trunk Assessment Cervical / Trunk Assessment: Normal   Communication Communication Communication: No difficulties   Cognition Arousal/Alertness: Awake/alert Behavior During Therapy: Flat affect Overall Cognitive  Status: Within Functional Limits for tasks assessed                                 General Comments: patient did demo increased participation - wanted to wipe off today.   General Comments       Exercises     Shoulder Instructions      Home Living Family/patient expects  to be discharged to:: Private residence Living Arrangements: Spouse/significant other Available Help at Discharge: Family;Available PRN/intermittently Type of Home: House Home Access: Level entry     Home Layout: One level     Bathroom Shower/Tub: Occupational psychologist: Handicapped height Bathroom Accessibility: Yes How Accessible: Accessible via walker Home Equipment: Shower seat          Prior Functioning/Environment Level of Independence: Independent                 OT Problem List: Decreased strength;Decreased activity tolerance;Impaired balance (sitting and/or standing);Decreased safety awareness;Pain      OT Treatment/Interventions: Self-care/ADL training;Therapeutic activities;Energy conservation    OT Goals(Current goals can be found in the care plan section) Acute Rehab OT Goals Patient Stated Goal: hopefully I can go home soon OT Goal Formulation: With patient Time For Goal Achievement: 01/03/20 Potential to Achieve Goals: Good ADL Goals Pt Will Perform Grooming: standing;sitting;with modified independence Pt Will Perform Lower Body Bathing: with modified independence;sit to/from stand Pt Will Perform Lower Body Dressing: with modified independence;sit to/from stand Pt Will Transfer to Toilet: with modified independence;ambulating Pt Will Perform Toileting - Clothing Manipulation and hygiene: Independently;sit to/from stand  OT Frequency: Min 1X/week   Barriers to D/C: Other (comment)  medical status       Co-evaluation              AM-PAC OT "6 Clicks" Daily Activity     Outcome Measure Help from another person eating meals?: None Help from another person taking care of personal grooming?: A Little Help from another person toileting, which includes using toliet, bedpan, or urinal?: A Little Help from another person bathing (including washing, rinsing, drying)?: A Little Help from another person to put on and taking off regular  upper body clothing?: A Little Help from another person to put on and taking off regular lower body clothing?: A Little 6 Click Score: 19   End of Session Equipment Utilized During Treatment: Gait belt Nurse Communication: Other (comment) (position of patient)  Activity Tolerance: Patient tolerated treatment well Patient left: in chair;with call bell/phone within reach;with chair alarm set;with nursing/sitter in room  OT Visit Diagnosis: Unsteadiness on feet (R26.81);Muscle weakness (generalized) (M62.81);Pain Pain - part of body:  (throat)                Time: 5462-7035 OT Time Calculation (min): 39 min Charges:  OT General Charges $OT Visit: 1 Visit OT Evaluation $OT Eval Moderate Complexity: 1 Mod OT Treatments $Self Care/Home Management : 23-37 mins  12/26/2019  Rich, OTR/L  Acute Rehabilitation Services  Office:  [place] 12/26/2019, 9:03 AM

## 2019-12-27 LAB — SURGICAL PATHOLOGY

## 2019-12-27 MED ORDER — HYDROCODONE-ACETAMINOPHEN 10-325 MG PO TABS
1.0000 | ORAL_TABLET | Freq: Four times a day (QID) | ORAL | 0 refills | Status: DC | PRN
Start: 2019-12-27 — End: 2020-02-10

## 2019-12-27 MED ORDER — LIDOCAINE VISCOUS HCL 2 % MT SOLN: 15.0000 mL | Freq: Three times a day (TID) | OROMUCOSAL | 0 refills | Status: AC

## 2019-12-27 MED ORDER — PANTOPRAZOLE SODIUM 40 MG PO PACK: 40.0000 mg | PACK | Freq: Two times a day (BID) | ORAL | 0 refills | Status: DC

## 2019-12-27 MED ORDER — VALACYCLOVIR 50 MG/ML ORAL SUSPENSION: 900.0000 mg | Freq: Two times a day (BID) | ORAL | Status: DC

## 2019-12-27 MED ORDER — ADULT MULTIVITAMIN W/MINERALS CH: 1.0000 | ORAL_TABLET | Freq: Every day | ORAL | 0 refills | Status: AC

## 2019-12-27 MED ORDER — VALGANCICLOVIR HCL 50 MG/ML PO SOLR: 900.0000 mg | Freq: Two times a day (BID) | ORAL | 0 refills | Status: AC

## 2019-12-27 MED ORDER — VALGANCICLOVIR HCL 50 MG/ML PO SOLR
900.0000 mg | Freq: Two times a day (BID) | ORAL | Status: DC
  Administered 2019-12-27: 900 mg via ORAL
  Filled 2019-12-27 (×2): qty 18

## 2019-12-27 MED ORDER — ENSURE ENLIVE PO LIQD: 237.0000 mL | Freq: Three times a day (TID) | ORAL | 0 refills | Status: AC

## 2019-12-27 NOTE — Discharge Summary (Signed)
Physician Discharge Summary  Melvin Foley ZOX:[id number] DOB: Sep 03, 1945 DOA: 12/20/2019  PCP: Wallie Char, FNP  Admit date: 12/20/2019 Discharge date: 12/27/2019  Admitted From: Home  Disposition:  Home   Recommendations for Outpatient Follow-up and new medication changes:  1. Follow up with Christel Mormon E FNP in 7 days.  2. Continue antiacid therapy with pantoprazole, sucralfate and lidocaine viscus.  3. Patient continue with soft diet for now.  4. He has been placed on empiric treatment of CMV esophagitis with Valganciclovir 900 mg po bid for 21 days.   Home Health: yes   Equipment/Devices: na   Discharge Condition: stable  CODE STATUS: dnr   Diet recommendation: soft diet.   Brief/Interim Summary: Patient was admitted to the hospital working diagnosis of radiation esophagitis.  74 year old male with past medical history for adenocarcinoma of the right lower lung, currently on chemotherapyandradiation therapy. He also hashypertension, COPD, schizophrenia and obstructivesleep apnea. He had recent hospitalization 8/28-8/31 for neutropenic fever. Patient received Granix with improvement of leukopenia and he was discharged on Augmentin. At home patient developed persistent fever, ongoing cough and persistent difficulty swallowing due to severe odynophagia.On his initial physical examination blood pressure 109/72, 95/53, heart rate 77, respiratory rate 16, temperature 38.8 C, oxygen saturation 91%,her lungs were clear to auscultation bilaterally, heart S1-S2, present rhythmic, soft abdomen, no lower extremity edema. Sodium 134, potassium 3.3, chloride 99, bicarb 25, glucose 109, BUN 12, creatinine 1.0, magnesium 1.4, AST 26, ALT 12, lactic acid 2.8, white count 3.4, hemoglobin 9.2, hematocrit 27.6, platelets 307.SARS COVID-19 negative. Urinalysis specific gravity 1.020,negative leukocytes negative nitrates. CT chest negative for pulmonary embolism, persistent right  lower lobe mass, 3.9 x 3.3 cm. Chest radiograph with right lower lobe mass. EKG 89 bpm, left axis deviation with left anterior fascicular block, sinus rhythm, no ST segment or T wave changes.  Patient has been placed on antiacid therapy with good toleration, very slow recovery.   Because persistent pain and uncontrolled pain,patient underwent upper endoscopy  showing LA grade D circumferential radiation esophagitis with no bleeding. Short segment of suspected Barrett's esophagus.   Biopsy pending but preliminary report showing possible CMV infection.    1.  Radiation esophagitis, CMV esophagitis.  Patient was admitted to the medical ward, his symptoms were slowly to respond to aggressive antiacid therapy, he had persistent odynophagia. Further work-up with endoscopy did show signs of radiation esophagitis, circumferential lesion, possible Barrett's esophagus.  Apparently lesions seem to be healing.  Patient did receive empiric therapy for Candida esophagitis with fluconazole.  Final biopsy report still pending but preliminary has suspicion for CMV infection.  Patient symptoms improved, his diet was advanced from full liquids to soft with good toleration.  Patient will continue antiacid therapy at home along with lidocaine viscous.  Empiric therapy for CMV esophagitis will be given, valganciclovir 900 mg twice daily for 21 days.  Continue pain control with hydrocodone as needed.  2.  Recovering neutropenic fever.  No further fever spikes, his neutropenia resolved.  His discharge white cell count is 4.0, hemoglobin 7.2, hematocrit 32.4 and platelets 273.  3.  Dehydration with hypokalemia, hyponatremia and hypomagnesemia.  Patient received intravenous fluids with good toleration, his electrolytes were corrected. At discharge sodium 136, potassium 3.9, chloride 103, bicarb 24, glucose 102, BUN less than 5 and serum creatinine 0.91.  4.  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  Iron stores revealed serum iron of 26,  TIBC 161, transferrin saturation 16, ferritin 661. Patient received 1 dose of IV iron.  He will follow-up with oncology as an outpatient.  5.  Schizophrenia.  No decompensation, continue thiothixene.   Discharge Diagnoses:  Principal Problem:   Radiation esophagitis Active Problems:   Carotid stenosis   Fever   Primary adenocarcinoma of lower lobe of right lung (HCC)   Bipolar disorder ([place])    Discharge Instructions   Allergies as of 12/27/2019   No Known Allergies     Medication List    STOP taking these medications   amoxicillin-clavulanate 875-125 MG tablet Commonly known as: AUGMENTIN   famotidine 40 MG tablet Commonly known as: PEPCID   omeprazole 20 MG capsule Commonly known as: PRILOSEC     TAKE these medications   acetaminophen 500 MG tablet Commonly known as: TYLENOL Take 1,000 mg by mouth every 6 (six) hours as needed for mild pain.   albuterol 108 (90 Base) MCG/ACT inhaler Commonly known as: VENTOLIN HFA Inhale 1-2 puffs into the lungs every 6 (six) hours as needed for wheezing or shortness of breath.   clopidogrel 75 MG tablet Commonly known as: PLAVIX Take 1 tablet (75 mg total) by mouth at bedtime.   feeding supplement (ENSURE ENLIVE) Liqd Take 237 mLs by mouth 3 (three) times daily between meals.   fluticasone 50 MCG/ACT nasal spray Commonly known as: FLONASE Place 2 sprays into both nostrils daily.   guaiFENesin 600 MG 12 hr tablet Commonly known as: MUCINEX Take 600 mg by mouth 2 (two) times daily.   HYDROcodone-acetaminophen 10-325 MG tablet Commonly known as: NORCO Take 1 tablet by mouth every 6 (six) hours as needed for moderate pain or severe pain.   hydrocortisone cream 1 % Apply topically 2 (two) times daily.   lidocaine 2 % solution Commonly known as: XYLOCAINE Use as directed 15 mLs in the mouth or  throat 4 (four) times daily -  before meals and at bedtime.   multivitamin with minerals Tabs tablet Take 1 tablet by mouth daily. Start taking on: December 28, 2019   pantoprazole sodium 40 mg/20 mL Pack Commonly known as: PROTONIX Take 20 mLs (40 mg total) by mouth 2 (two) times daily.   sildenafil 100 MG tablet Commonly known as: VIAGRA Take 100 mg by mouth as needed for erectile dysfunction.   sucralfate 1 GM/10ML suspension Commonly known as: CARAFATE Take 10 mLs (1 g total) by mouth 4 (four) times daily -  with meals and at bedtime. What changed: when to take this   thiothixene 2 MG capsule Commonly known as: NAVANE Take 2 mg by mouth 3 (three) times daily.   valganciclovir 50 MG/ML Solr Commonly known as: VALCYTE Take 18 mLs (900 mg total) by mouth 2 (two) times daily for 21 days.            Durable Medical Equipment  (From admission, onward)         Start     Ordered   12/27/19 1528  For home use only DME Walker rolling  Once       Question Answer Comment  Walker: With 5 Inch Wheels   Patient needs a walker to treat with the following condition Weakness      12/27/19 1528          No Known Allergies  Consultations:  GI    Procedures/Studies: CT Angio Chest PE W and/or Wo Contrast  Result Date: 12/20/2019 CLINICAL DATA:  PE suspected, high prob.  Lung cancer. EXAM: CT ANGIOGRAPHY CHEST WITH CONTRAST TECHNIQUE: Multidetector CT imaging of the chest was performed using the standard protocol during bolus administration of intravenous contrast. Multiplanar CT image reconstructions and MIPs were obtained to evaluate the vascular anatomy. CONTRAST:  170mL OMNIPAQUE IOHEXOL 350 MG/ML SOLN COMPARISON:  Radiograph earlier today.  Chest CTA 08/29/2019 FINDINGS: Cardiovascular: There are no filling defects within the pulmonary arteries to suggest pulmonary embolus. Again seen truncation of the right lower lobe pulmonary arteries in the region of masslike  opacity. Aortic atherosclerosis without aneurysm or dissection. Left vertebral artery arises directly from the thoracic aorta. Heart is normal in size. No pericardial effusion. There are coronary artery calcifications. Mediastinum/Nodes: Stable 10 mm right hilar lymph node. Decreased size of lower paratracheal node measuring 9 mm, previously 12 mm. No new hilar or mediastinal adenopathy. No esophageal wall thickening. No visualized thyroid nodule. Lungs/Pleura: Decreased size of masslike consolidation in the right lower lobe with residual mass measuring approximately 3.9 x 3.3 cm, measured on series 6, image 50. Adjacent irregular atelectasis. Small right pleural effusion and pleural thickening. The adjacent 1 cm nodule in the medial right lung base on prior is no longer seen. Subpleural reticulation in the anterior right upper lobe series 6, image 37, with decreased soft tissue component from prior exam. Underlying emphysema and subpleural reticulation/fibrosis, unchanged. No findings of pulmonary edema. Upper Abdomen: No acute findings.  Post cholecystectomy. Musculoskeletal: There are no acute or suspicious osseous abnormalities. Bones are under mineralized. Degenerative change in the spine. Prior surgical fixation of the left proximal humerus. Review of the MIP images confirms the above findings. IMPRESSION: 1. No pulmonary embolus. 2. Decreased size of masslike consolidation in the right lower lobe with residual mass measuring approximately 3.9 x 3.3 cm. The adjacent 1 cm nodule in the medial right lung base on prior is no longer seen. Small right pleural effusion and pleural thickening. 3. Decreased soft tissue density of the subpleural reticulation in the right upper lobe from prior. 4. Slight decreased size of mediastinal lymph nodes. Aortic Atherosclerosis (ICD10-I70.0) and Emphysema (ICD10-J43.9). Electronically Signed   By: Keith Rake M.D.   On: 12/20/2019 18:13   DG Chest Port 1 View  Result  Date: 12/20/2019 CLINICAL DATA:  Shortness of breath.  Lung cancer. EXAM: PORTABLE CHEST 1 VIEW COMPARISON:  Chest radiograph 12/14/2019.  CT 08/29/2019 FINDINGS: The heart size and mediastinal contours are within normal limits. Similar appearance of masslike consolidation in the right lung base, compatible with the patient's known malignancy. Similar reticulation in the left lung base. Right costophrenic opacity likely represents pleural scarring when correlating with prior chest CT. No left pleural effusion. No pneumothorax. No acute osseous abnormality. Partially imaged ORIF of the left humerus. Cholecystectomy clips. IMPRESSION: 1. Similar appearance of masslike consolidation in the right lung base, compatible with the patient's known malignancy. 2. Similar reticulation in the left lung base, which may represent mild edema, infection, or post treatment change if the patient received radiation to this area Electronically Signed   By: Margaretha [place] MD   On: 12/20/2019 14:17   DG Chest Portable 1 View  Result Date: 12/14/2019 CLINICAL DATA:  Shortness of breath.  Known lung cancer. EXAM: PORTABLE CHEST 1 VIEW COMPARISON:  CT chest dated 08/29/2019 FINDINGS: The heart size and mediastinal contours are within normal limits. Masslike consolidation in the right lung base and peripheral reticulation in the right mid lung likely represents the patient's known malignancy. There is mild left basilar reticulation. There  is no left pleural effusion. There is no pneumothorax. The visualized skeletal structures are unremarkable. IMPRESSION: Masslike consolidation in the right lung base and peripheral reticulation in the right mid lung likely represents the patient's known malignancy. Mild left basilar reticulation is nonspecific and may represent atelectasis, infection, edema, or post treatment changes of the patient received radiation to this area. Electronically Signed   By: Zerita Boers M.D.   On: 12/14/2019 12:41       Procedures: EGD   Subjective: Patient is feeling better, he is tolerating soft diet with no nausea or vomiting, his chest pain and odynophagia have improved. His wife is at the bedside.    Discharge Exam: Vitals:   12/26/19 2159 12/27/19 0513  BP: 103/73 118/78  Pulse: 95 83  Resp: 16 16  Temp: 98.7 F (37.1 C) 97.9 F (36.6 C)  SpO2: 92% 95%   Vitals:   12/26/19 1427 12/26/19 1757 12/26/19 2159 12/27/19 0513  BP: 128/90 109/81 103/73 118/78  Pulse: 81 81 95 83  Resp: 20 18 16 16   Temp: 98 F (36.7 C) 98.6 F (37 C) 98.7 F (37.1 C) 97.9 F (36.6 C)  TempSrc:   Oral Oral  SpO2: 98% 95% 92% 95%  Weight:      Height:        General: Not in pain or dyspnea  Neurology: Awake and alert, non focal  E ENT: mild pallor, no icterus, oral mucosa moist Cardiovascular: No JVD. S1-S2 present, rhythmic, no gallops, rubs, or murmurs. No lower extremity edema. Pulmonary: positive breath sounds bilaterally, adequate air movement, no wheezing, rhonchi or rales. Gastrointestinal. Abdomen soft and non tender Musculoskeletal: no joint deformities   The results of significant diagnostics from this hospitalization (including imaging, microbiology, ancillary and laboratory) are listed below for reference.     Microbiology: Recent Results (from the past 240 hour(s))  SARS Coronavirus 2 by RT PCR (hospital order, performed in [facility] hospital lab) Nasopharyngeal Nasopharyngeal Swab     Status: None   Collection Time: 12/20/19  2:06 PM   Specimen: Nasopharyngeal Swab  Result Value Ref Range Status   SARS Coronavirus 2 NEGATIVE NEGATIVE Final    Comment: (NOTE) SARS-CoV-2 target nucleic acids are NOT DETECTED.  The SARS-CoV-2 RNA is generally detectable in upper and lower respiratory specimens during the acute phase of infection. The lowest concentration of SARS-CoV-2 viral copies this assay can detect is 250 copies / mL. A negative result does not preclude SARS-CoV-2  infection and should not be used as the sole basis for treatment or other patient management decisions.  A negative result may occur with improper specimen collection / handling, submission of specimen other than nasopharyngeal swab, presence of viral mutation(s) within the areas targeted by this assay, and inadequate number of viral copies (<250 copies / mL). A negative result must be combined with clinical observations, patient history, and epidemiological information.  Fact Sheet for Patients:   StrictlyIdeas.no  Fact Sheet for Healthcare Providers: BankingDealers.co.za  This test is not yet approved or  cleared by the [place] FDA and has been authorized for detection and/or diagnosis of SARS-CoV-2 by FDA under an Emergency Use Authorization (EUA).  This EUA will remain in effect (meaning this test can be used) for the duration of the COVID-19 declaration under Section 564(b)(1) of the Act, 21 U.S.C. section 360bbb-3(b)(1), unless the authorization is terminated or revoked sooner.  Performed at [facility], [address], [place], [place] [address]  Culture, blood (Routine x 2)     Status: None   Collection Time: 12/20/19  2:27 PM   Specimen: BLOOD LEFT FOREARM  Result Value Ref Range Status   Specimen Description   Final    BLOOD LEFT FOREARM Performed at [facility], [address] [place], [place] [address]    Special Requests   Final    BOTTLES DRAWN AEROBIC AND ANAEROBIC Blood Culture adequate volume Performed at [facility], [address] [place], [place] [address]    Culture   Final    NO GROWTH 5 DAYS Performed at [facility], [place] [address] [place], [place] [address]    Report Status 12/25/2019 FINAL  Final  Urine culture     Status: None   Collection Time: 12/20/19  9:24 PM   Specimen: Urine, Random  Result Value Ref Range Status   Specimen Description    Final    URINE, RANDOM Performed at [facility], [address] [place], [place] [address]    Special Requests   Final    NONE Performed at [facility], [address] [place], [place] [address]    Culture   Final    NO GROWTH Performed at [facility], [place] [address], [place], [place] [address]    Report Status 12/22/2019 FINAL  Final     Labs: BNP (last 3 results) No results for input(s): BNP in the last 8760 hours. Basic Metabolic Panel: Recent Labs  Lab 12/20/19 1433 12/21/19 0747 12/22/19 0203 12/23/19 0119 12/24/19 0107 12/25/19 0427 12/26/19 0604  NA  --    < > 139 136 135 136 136  K  --    < > 3.5 3.0* 3.4* 3.7 3.9  CL  --    < > 106 104 104 102 103  CO2  --    < > 25 24 24 25 24   GLUCOSE  --    < > 107* 124* 111* 93 102*  BUN  --    < > 5* <5* <5* <5* <5*  CREATININE  --    < > 0.83 0.90 0.88 0.93 0.91  CALCIUM  --    < > 8.1* 8.0* 7.9* 8.4* 8.2*  MG 1.4*  --  1.5* 1.9 1.3* 1.7  --    < > = values in this interval not displayed.   Liver Function Tests: Recent Labs  Lab 12/20/19 1406 12/23/19 0119 12/24/19 0107  AST 26 41 36  ALT 12 15 15   ALKPHOS 46 38 36*  BILITOT 1.1 0.9 1.1  PROT 7.0 5.2* 5.0*  ALBUMIN 3.4* 2.3* 2.3*   No results for input(s): LIPASE, AMYLASE in the last 168 hours. No results for input(s): AMMONIA in the last 168 hours. CBC: Recent Labs  Lab 12/20/19 1406 12/21/19 0747 12/21/19 1457 12/23/19 0119 12/24/19 0107  WBC 3.4* 3.0* 3.3* 3.5* 4.0  NEUTROABS 1.7  --  2.4  --   --   HGB 9.2* 7.3* 8.0* 7.7* 7.2*  HCT 27.6* 22.4* 23.8* 22.7* 22.4*  MCV 92.0 91.1 91.2 93.0 92.2  PLT 307 239 237 284 273   Cardiac Enzymes: No results for input(s): CKTOTAL, CKMB, CKMBINDEX, TROPONINI in the last 168 hours. BNP: Invalid input(s): POCBNP CBG: No results for input(s): GLUCAP in the last 168 hours. D-Dimer No results for input(s): DDIMER in the last 72 hours. Hgb A1c No results for input(s): HGBA1C  in the last 72 hours. Lipid Profile No results for input(s): CHOL, HDL, LDLCALC, TRIG, CHOLHDL, LDLDIRECT in the last 72 hours. Thyroid function studies No results for input(s): TSH, T4TOTAL, T3FREE, THYROIDAB in the last 72 hours.  Invalid input(s): FREET3 Anemia work up No results for input(s): VITAMINB12, FOLATE, FERRITIN, TIBC, IRON, RETICCTPCT in the last 72 hours. Urinalysis    Component Value Date/Time   COLORURINE YELLOW 12/20/2019 1406   APPEARANCEUR CLEAR 12/20/2019 1406   LABSPEC 1.020 12/20/2019 1406   PHURINE 6.0 12/20/2019 1406   GLUCOSEU NEGATIVE 12/20/2019 1406   HGBUR NEGATIVE 12/20/2019 1406   BILIRUBINUR NEGATIVE 12/20/2019 1406   KETONESUR 15 (A) 12/20/2019 1406   PROTEINUR NEGATIVE 12/20/2019 1406   NITRITE NEGATIVE 12/20/2019 1406   LEUKOCYTESUR NEGATIVE 12/20/2019 1406   Sepsis Labs Invalid input(s): PROCALCITONIN,  WBC,  LACTICIDVEN Microbiology Recent Results (from the past 240 hour(s))  SARS Coronavirus 2 by RT PCR (hospital order, performed in [facility] hospital lab) Nasopharyngeal Nasopharyngeal Swab     Status: None   Collection Time: 12/20/19  2:06 PM   Specimen: Nasopharyngeal Swab  Result Value Ref Range Status   SARS Coronavirus 2 NEGATIVE NEGATIVE Final    Comment: (NOTE) SARS-CoV-2 target nucleic acids are NOT DETECTED.  The SARS-CoV-2 RNA is generally detectable in upper and lower respiratory specimens during the acute phase of infection. The lowest concentration of SARS-CoV-2 viral copies this assay can detect is 250 copies / mL. A negative result does not preclude SARS-CoV-2 infection and should not be used as the sole basis for treatment or other patient management decisions.  A negative result may occur with improper specimen collection / handling, submission of specimen other than nasopharyngeal swab, presence of viral mutation(s) within the areas targeted by this assay, and inadequate number of viral copies (<250 copies / mL). A  negative result must be combined with clinical observations, patient history, and epidemiological information.  Fact Sheet for Patients:   StrictlyIdeas.no  Fact Sheet for Healthcare Providers: BankingDealers.co.za  This test is not yet approved or  cleared by the [place] FDA and has been authorized for detection and/or diagnosis of SARS-CoV-2 by FDA under an Emergency Use Authorization (EUA).  This EUA will remain in effect (meaning this test can be used) for the duration of the COVID-19 declaration under Section 564(b)(1) of the Act, 21 U.S.C. section 360bbb-3(b)(1), unless the authorization is terminated or revoked sooner.  Performed at [facility], [address] [place], [place] [address]   Culture, blood (Routine x 2)     Status: None   Collection Time: 12/20/19  2:27 PM   Specimen: BLOOD LEFT FOREARM  Result Value Ref Range Status   Specimen Description   Final    BLOOD LEFT FOREARM Performed at [facility], [address] [place], [place] [address]    Special Requests   Final    BOTTLES DRAWN AEROBIC AND ANAEROBIC Blood Culture adequate volume Performed at [facility], [address] [place], [place] [address]    Culture   Final    NO GROWTH 5 DAYS Performed at [facility], [place] [address], [place], [place] [address]    Report Status 12/25/2019 FINAL  Final  Urine culture     Status: None   Collection Time: 12/20/19  9:24 PM   Specimen: Urine, Random  Result Value Ref Range Status   Specimen Description   Final    URINE,  RANDOM Performed at [facility], [address], [place], [place] [address]    Special Requests   Final    NONE Performed at [facility], [address] [place], [place] [address]    Culture   Final    NO GROWTH Performed at [facility], [place] [address], [place], [place] [address]    Report Status 12/22/2019 FINAL   Final     Time coordinating discharge: 45 minutes  SIGNED:   Tawni Millers, MD  [facility] 12/27/2019, 12:43 PM

## 2019-12-27 NOTE — Progress Notes (Signed)
Actual time 2130:  Wife of pt had called back and the cost of the Valcyte was $5000 at the [facility] and they did not even have it. She called the nurse with the [facility] and she was told that the [facility] [place]  and [facility] to get it. I called the Pharmacist here and spoke with him and there are really no other options but for the pt to go to the [facility] on Monday about the med.  I called the wife, we discussed the options and she is going to follow up with the [facility] on Monday about the med.  Wife, [name] is at [phone].

## 2019-12-27 NOTE — Progress Notes (Signed)
Brief GI progress note  Received a call from pathology about the esophagus biopsies potentially showing changes that could be related to CMV.  The endoscopic evaluation was not consistent with what normal CMV esophagitis looks like but still concerning enough from pathology perspective that consideration of treatment should be made. I discussed this with the primary medical service and they will discuss potential empiric treatment while awaiting further stains that pathology states should be back by Friday 9/10. GI awaits the final pathology.  Justice Britain, MD [facility]oscopy Office # [phone]

## 2019-12-27 NOTE — Progress Notes (Signed)
Physical Therapy Treatment Patient Details Name: Melvin Foley MRN: [id number] DOB: Oct 29, 1945 Today's Date: 12/27/2019    History of Present Illness Pt is a 74 y/o male admitted secondary to increased SOB and severe dysphagia and odynophagia. 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 S/p EGD 9/8. PMH includes lung cancer, HTN, COPD, schizophrenia. Of note, recent hospitalization 8/28-8/31/21 for neutropenic fever.    PT Comments    Pt with slow progression towards goals. Remains limited secondary to fatigue. Refusing to use RW and notable unsteadiness without use; required min guard A throughout. Educated about walking program and HEP. Current recommendations appropriate. Will continue to follow acutely.     Follow Up Recommendations  No PT follow up;Supervision for mobility/OOB (refusing HHPT )     Equipment Recommendations  Rolling walker with 5" wheels    Recommendations for Other Services       Precautions / Restrictions Precautions Precautions: Fall Restrictions Weight Bearing Restrictions: No    Mobility  Bed Mobility Overal bed mobility: Modified Independent                Transfers Overall transfer level: Needs assistance Equipment used: None Transfers: Sit to/from Stand Sit to Stand: Min guard         General transfer comment: Min guard for safety to stand.   Ambulation/Gait Ambulation/Gait assistance: Min guard Gait Distance (Feet): 15 Feet Assistive device: None Gait Pattern/deviations: Step-through pattern;Decreased stride length Gait velocity: Decreased   General Gait Details: Pt refusing to use RW this session and only agreeable to ambulate around the bed to the chair. Min guard for steadying assist.    Stairs             Wheelchair Mobility    Modified Rankin (Stroke Patients Only)       Balance Overall balance assessment: Needs assistance Sitting-balance support: Feet supported Sitting balance-Leahy Scale: Good      Standing balance support: No upper extremity supported Standing balance-Leahy Scale: Fair Standing balance comment: Can static stand without UE support; dynamic stability improved with RW                            Cognition Arousal/Alertness: Awake/alert Behavior During Therapy: Flat affect Overall Cognitive Status: No family/caregiver present to determine baseline cognitive functioning                                 General Comments: Remains slightly impulsive with decreased safety awareness.       Exercises      General Comments General comments (skin integrity, edema, etc.): Educated about generalized walking program and HEP for home      Pertinent Vitals/Pain Pain Assessment: No/denies pain    Home Living                      Prior Function            PT Goals (current goals can now be found in the care plan section) Acute Rehab PT Goals Patient Stated Goal: to go home PT Goal Formulation: With patient Time For Goal Achievement: 01/07/20 Potential to Achieve Goals: Fair Progress towards PT goals: Progressing toward goals    Frequency    Min 3X/week      PT Plan Current plan remains appropriate    Co-evaluation              AM-PAC  PT "6 Clicks" Mobility   Outcome Measure  Help needed turning from your back to your side while in a flat bed without using bedrails?: None Help needed moving from lying on your back to sitting on the side of a flat bed without using bedrails?: None Help needed moving to and from a bed to a chair (including a wheelchair)?: A Little Help needed standing up from a chair using your arms (e.g., wheelchair or bedside chair)?: A Little Help needed to walk in hospital room?: A Little Help needed climbing 3-5 steps with a railing? : A Little 6 Click Score: 20    End of Session   Activity Tolerance: Patient limited by fatigue Patient left: in chair;with call bell/phone within reach;with  nursing/sitter in room Nurse Communication: Mobility status;Other (comment) (pt requesting his medication before he eats) PT Visit Diagnosis: Unsteadiness on feet (R26.81);Muscle weakness (generalized) (M62.81)     Time: 1791-5056 PT Time Calculation (min) (ACUTE ONLY): 10 min  Charges:  $Gait Training: 8-22 mins                     Lou Miner, DPT  Acute Rehabilitation Services  Pager: [phone] Office: [phone]    Melvin Foley 12/27/2019, 3:52 PM

## 2019-12-27 NOTE — Progress Notes (Signed)
Pt ready for discharge to home with wife. Pt request equipment through the [place] services, explained the notes to wife. DC instructions given and explained.

## 2019-12-27 NOTE — TOC Progression Note (Signed)
Transition of Care [facility]) - Progression Note    Patient Details  Name: Melvin Foley MRN: [id number] Date of Birth: 1945/06/21  Transition of [facility]) CM/SW Contact  Jacalyn Lefevre Edson Snowball, RN Phone Number: 12/27/2019, 3:18 PM  Clinical Narrative:     Patient agreeable to home health services and walker. However, patient wants home health and walker through the [place]. NCM explained NCM will have to fax and call orders to [facility] PCP for approval and then [place] will arrange both. Provided medicare.gov list of home health agencies. Patient has no preference.  Called [place], spoke to [place] . [facility] Dr Helene Kelp.  Diane instructed NCM to fax clinicals and orders to Dr Helene Kelp at [phone]. Same done   Expected Discharge Plan: [place] Barriers to Discharge: No Barriers Identified  Expected Discharge Plan and Services Expected Discharge Plan: [place]   Discharge Planning Services: CM Consult Post Acute Care Choice: Home Health, Durable Medical Equipment Living arrangements for the past 2 months: Single Family Home Expected Discharge Date: 12/27/19                       Representative spoke with at DME Agency: see note         Representative spoke with at [place]: see note   Social Determinants of Health (SDOH) Interventions    Readmission Risk Interventions No flowsheet data found.

## 2020-01-03 ENCOUNTER — Encounter: Payer: Self-pay | Admitting: Gastroenterology

## 2020-02-03 ENCOUNTER — Emergency Department (HOSPITAL_BASED_OUTPATIENT_CLINIC_OR_DEPARTMENT_OTHER)
Admission: EM | Admit: 2020-02-03 | Discharge: 2020-02-03 | Disposition: A | Discharge status: Home / Self Care | Payer: No Typology Code available for payment source | Attending: Emergency Medicine | Admitting: Emergency Medicine

## 2020-02-03 ENCOUNTER — Other Ambulatory Visit: Payer: Self-pay

## 2020-02-03 ENCOUNTER — Emergency Department (HOSPITAL_BASED_OUTPATIENT_CLINIC_OR_DEPARTMENT_OTHER): Payer: No Typology Code available for payment source

## 2020-02-03 ENCOUNTER — Encounter (HOSPITAL_BASED_OUTPATIENT_CLINIC_OR_DEPARTMENT_OTHER): Payer: Self-pay | Admitting: *Deleted

## 2020-02-03 DIAGNOSIS — J189 Pneumonia, unspecified organism: Secondary | ICD-10-CM | POA: Diagnosis not present

## 2020-02-03 DIAGNOSIS — Z87891 Personal history of nicotine dependence: Secondary | ICD-10-CM | POA: Insufficient documentation

## 2020-02-03 DIAGNOSIS — J449 Chronic obstructive pulmonary disease, unspecified: Secondary | ICD-10-CM | POA: Insufficient documentation

## 2020-02-03 DIAGNOSIS — I10 Essential (primary) hypertension: Secondary | ICD-10-CM | POA: Diagnosis not present

## 2020-02-03 DIAGNOSIS — E871 Hypo-osmolality and hyponatremia: Secondary | ICD-10-CM | POA: Diagnosis not present

## 2020-02-03 DIAGNOSIS — Z20822 Contact with and (suspected) exposure to covid-19: Secondary | ICD-10-CM | POA: Insufficient documentation

## 2020-02-03 DIAGNOSIS — R031 Nonspecific low blood-pressure reading: Secondary | ICD-10-CM | POA: Diagnosis present

## 2020-02-03 DIAGNOSIS — Z85118 Personal history of other malignant neoplasm of bronchus and lung: Secondary | ICD-10-CM | POA: Diagnosis not present

## 2020-02-03 DIAGNOSIS — Z7902 Long term (current) use of antithrombotics/antiplatelets: Secondary | ICD-10-CM | POA: Insufficient documentation

## 2020-02-03 LAB — BASIC METABOLIC PANEL
Anion gap: 9 (ref 5–15)
BUN: 22 mg/dL (ref 8–23)
CO2: 24 mmol/L (ref 22–32)
Calcium: 8.3 mg/dL — ABNORMAL LOW (ref 8.9–10.3)
Chloride: 94 mmol/L — ABNORMAL LOW (ref 98–111)
Creatinine, Ser: 1.14 mg/dL (ref 0.61–1.24)
GFR, Estimated: >60 mL/min (ref >60)
Glucose, Bld: 110 mg/dL — ABNORMAL HIGH (ref 70–99)
Potassium: 3.6 mmol/L (ref 3.5–5.1)
Sodium: 127 mmol/L — ABNORMAL LOW (ref 135–145)

## 2020-02-03 LAB — CBC WITH DIFFERENTIAL/PLATELET
Abs Immature Granulocytes: 0.03 10*3/uL (ref 0.00–0.07)
Basophils Absolute: 0 10*3/uL (ref 0.0–0.1)
Basophils Relative: 0 %
Eosinophils Absolute: 0.2 10*3/uL (ref 0.0–0.5)
Eosinophils Relative: 5 %
HCT: 35.1 % — ABNORMAL LOW (ref 39.0–52.0)
Hemoglobin: 11.5 g/dL — ABNORMAL LOW (ref 13.0–17.0)
Immature Granulocytes: 1 %
Lymphocytes Relative: 14 %
Lymphs Abs: 0.6 10*3/uL — ABNORMAL LOW (ref 0.7–4.0)
MCH: 32.3 pg (ref 26.0–34.0)
MCHC: 32.8 g/dL (ref 30.0–36.0)
MCV: 98.6 fL (ref 80.0–100.0)
Monocytes Absolute: 1 10*3/uL (ref 0.1–1.0)
Monocytes Relative: 22 %
Neutro Abs: 2.7 10*3/uL (ref 1.7–7.7)
Neutrophils Relative %: 58 %
Platelets: 169 10*3/uL (ref 150–400)
RBC: 3.56 MIL/uL — ABNORMAL LOW (ref 4.22–5.81)
RDW: 14.5 % (ref 11.5–15.5)
WBC: 4.6 10*3/uL (ref 4.0–10.5)
nRBC: 0 % (ref 0.0–0.2)

## 2020-02-03 LAB — LACTIC ACID, PLASMA: Lactic Acid, Venous: 1.8 mmol/L (ref 0.5–1.9)

## 2020-02-03 LAB — RESPIRATORY PANEL BY RT PCR (FLU A&B, COVID)
Influenza A by PCR: NEGATIVE
Influenza B by PCR: NEGATIVE
SARS Coronavirus 2 by RT PCR: NEGATIVE

## 2020-02-03 LAB — TROPONIN I (HIGH SENSITIVITY): Troponin I (High Sensitivity): 9 ng/L (ref <18)

## 2020-02-03 MED ORDER — DOXYCYCLINE HYCLATE 100 MG PO CAPS: 100.0000 mg | ORAL_CAPSULE | Freq: Two times a day (BID) | ORAL | 0 refills | Status: DC

## 2020-02-03 MED ORDER — SODIUM CHLORIDE 0.9 % IV BOLUS
1000.0000 mL | Freq: Once | INTRAVENOUS | Status: AC
  Administered 2020-02-03: 1000 mL via INTRAVENOUS

## 2020-02-03 MED ORDER — AMOXICILLIN-POT CLAVULANATE 875-125 MG PO TABS: 1.0000 | ORAL_TABLET | Freq: Two times a day (BID) | ORAL | 0 refills | Status: DC

## 2020-02-03 NOTE — ED Notes (Signed)
Ambulated on r/a HR 86, SPO2 98-99%, no DOE noted.

## 2020-02-03 NOTE — Discharge Instructions (Signed)
Please take antibiotic as prescribed for your suspected pneumonia.  Please get a follow-up appointment with your primary doctor ideally to be seen in the next 24 to 48 hours for close recheck.  You should have both your blood work including your electrolytes and a chest x-ray repeated.  Your oncologist or primary doctor can help arrange this.

## 2020-02-03 NOTE — ED Triage Notes (Signed)
Hypotension. States he has cancer and has to check his BP 3 times a day. No symptoms. States he feels wobbly. No pain.

## 2020-02-03 NOTE — ED Provider Notes (Signed)
[place] EMERGENCY DEPARTMENT Provider Note   CSN: [id number] Arrival date & time: 02/03/20  1612     History Chief Complaint  Patient presents with  . Hypotension    Melvin Foley is a 74 y.o. male.  74 year old male with past medical history for adenocarcinoma of the right lower lung, recently on chemotherapyandradiation therapy but not currently on any chemotherapy agents. He also hashypertension, COPD, schizophrenia and obstructivesleep apnea.  Patient reports that he had a low blood pressure recording at home.  Reports that he checks his vital signs 3 times daily, normally his blood pressure runs in the 100s to 90s however he had one recording that was in the 36U systolic.  Reports that as he rechecked it it came back up.  He reports that he has had absolutely no medical symptoms today, specifically denies any lightheadedness, chest pain, difficulty in breathing, fevers, nausea or vomiting.  He denies any recent medication changes, reports that he is not currently on chemotherapy, reports he has plans to follow-up with his oncologist soon for another CT scan to evaluate disease after he had completed recent course of chemotherapy.   HPI     Past Medical History:  Diagnosis Date  . Anxiety    takes medication  . Arthritis   . Bipolar 1 disorder ([place])   . Bronchitis   . Cancer ([place])   . Carotid stenosis, left   . GERD (gastroesophageal reflux disease)   . H/O hiatal hernia   . Hypertension   . Shortness of breath   . Sleep apnea    2 YEARS  [place]   . Stroke [facility])     Patient Active Problem List   Diagnosis Date Noted  . 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 Fever 12/21/2019  . Primary adenocarcinoma of lower lobe of right lung ([place]) 12/21/2019  . Bipolar disorder ([place]) 12/21/2019  . Neutropenia ([place]) 12/15/2019  . Neutropenic fever ([place]) 12/14/2019  . Carotid stenosis 05/03/2017  . Difficulty with speech   . TIA (transient ischemic  attack) 04/30/2017  . Hyperlipidemia 04/30/2017  . Proximal humerus fracture 03/02/2011    Past Surgical History:  Procedure Laterality Date  . BIOPSY  12/25/2019   Procedure: BIOPSY;  Surgeon: Rush Landmark Telford Nab., MD;  Location: [place];  Service: Gastroenterology;;  . CHOLECYSTECTOMY    . ENDARTERECTOMY Left 05/03/2017   Procedure: ENDARTERECTOMY CAROTID LEFT;  Surgeon: Rosetta Posner, MD;  Location: [facility] OR;  Service: Vascular;  Laterality: Left;  . ESOPHAGOGASTRODUODENOSCOPY (EGD) WITH PROPOFOL N/A 12/25/2019   Procedure: ESOPHAGOGASTRODUODENOSCOPY (EGD) WITH PROPOFOL;  Surgeon: Irving Copas., MD;  Location: [place];  Service: Gastroenterology;  Laterality: N/A;  . FRACTURE SURGERY     right ankle  . head injury    . KNEE SURGERY     x2  . ORIF SHOULDER FRACTURE  03/03/2011   Procedure: OPEN REDUCTION INTERNAL FIXATION (ORIF) SHOULDER FRACTURE;  Surgeon: Augustin Schooling;  Location: [place];  Service: Orthopedics;  Laterality: Left;  LEFT PROXIMAL HUMERUS Open Reduction internal fixation  . SHOULDER ARTHROSCOPY WITH SUBACROMIAL DECOMPRESSION AND BICEP TENDON REPAIR  02/24/2012   Procedure: SHOULDER ARTHROSCOPY WITH SUBACROMIAL DECOMPRESSION AND BICEP TENDON REPAIR;  Surgeon: Augustin Schooling, MD;  Location: [place];  Service: Orthopedics;  Laterality: Left;  with capsular release and lysis of adhesions  . TONSILLECTOMY         Family History  Problem Relation Age of Onset  . Congestive Heart Failure Mother   .  Stroke Mother   . Congestive Heart Failure Father     Social History   Tobacco Use  . Smoking status: Former Smoker    Packs/day: 1.50  . Smokeless tobacco: Never Used  . Tobacco comment: stopped smoking 20 years ago  Vaping Use  . Vaping Use: Never used  Substance Use Topics  . Alcohol use: No    Comment: past 20 years ago  . Drug use: Not Currently    Types: Marijuana, LSD    Home Medications Prior to Admission medications   Medication Sig  Start Date End Date Taking? Authorizing Provider  acetaminophen (TYLENOL) 500 MG tablet Take 1,000 mg by mouth every 6 (six) hours as needed for mild pain.    [provider]  albuterol (VENTOLIN HFA) 108 (90 Base) MCG/ACT inhaler Inhale 1-2 puffs into the lungs every 6 (six) hours as needed for wheezing or shortness of breath.    [provider]  amoxicillin-clavulanate (AUGMENTIN) 875-125 MG tablet Take 1 tablet by mouth every 12 (twelve) hours for 7 days. 02/03/20 02/10/20  Lucrezia Starch, MD  clopidogrel (PLAVIX) 75 MG tablet Take 1 tablet (75 mg total) by mouth at bedtime. 05/01/17   Geradine Girt, DO  doxycycline (VIBRAMYCIN) 100 MG capsule Take 1 capsule (100 mg total) by mouth 2 (two) times daily for 7 days. 02/03/20 02/10/20  Lucrezia Starch, MD  fluticasone (FLONASE) 50 MCG/ACT nasal spray Place 2 sprays into both nostrils daily.  01/08/19   [provider]  guaiFENesin (MUCINEX) 600 MG 12 hr tablet Take 600 mg by mouth 2 (two) times daily.  08/06/19   [provider]  HYDROcodone-acetaminophen (NORCO) 10-325 MG tablet Take 1 tablet by mouth every 6 (six) hours as needed for moderate pain or severe pain. 12/27/19   Arrien, Jimmy Picket, MD  hydrocortisone cream 1 % Apply topically 2 (two) times daily. 12/17/19   Nita Sells, MD  pantoprazole sodium (PROTONIX) 40 mg/20 mL PACK Take 20 mLs (40 mg total) by mouth 2 (two) times daily. 12/27/19 01/26/20  Arrien, Jimmy Picket, MD  sildenafil (VIAGRA) 100 MG tablet Take 100 mg by mouth as needed for erectile dysfunction.  04/04/19   [provider]  sucralfate (CARAFATE) 1 GM/10ML suspension Take 10 mLs (1 g total) by mouth 4 (four) times daily -  with meals and at bedtime. 12/25/19 01/24/20  Arrien, Jimmy Picket, MD  thiothixene (NAVANE) 2 MG capsule Take 2 mg by mouth 3 (three) times daily.     [provider]    Allergies    Patient has no known allergies.  Review of  Systems   Review of Systems  Constitutional: Negative for chills and fever.  HENT: Negative for ear pain and sore throat.   Eyes: Negative for pain and visual disturbance.  Respiratory: Negative for cough and shortness of breath.   Cardiovascular: Negative for chest pain and palpitations.  Gastrointestinal: Negative for abdominal pain and vomiting.  Genitourinary: Negative for dysuria and hematuria.  Musculoskeletal: Negative for arthralgias and back pain.  Skin: Negative for color change and rash.  Neurological: Negative for seizures and syncope.  All other systems reviewed and are negative.   Physical Exam Updated Vital Signs BP 115/85   Pulse 81   Temp 97.7 F (36.5 C) (Oral)   Resp 17   Ht 5\' 8"  (1.727 m)   Wt 69.7 kg   SpO2 97%   BMI 23.35 kg/m   Physical Exam Vitals and nursing note  reviewed.  Constitutional:      Appearance: He is well-developed.  HENT:     Head: Normocephalic and atraumatic.  Eyes:     Conjunctiva/sclera: Conjunctivae normal.  Cardiovascular:     Rate and Rhythm: Normal rate and regular rhythm.     Pulses: Normal pulses.     Heart sounds: No murmur heard.   Pulmonary:     Effort: Pulmonary effort is normal. No respiratory distress.     Breath sounds: Normal breath sounds.  Abdominal:     Palpations: Abdomen is soft.     Tenderness: There is no abdominal tenderness.  Musculoskeletal:     Cervical back: Neck supple.  Skin:    General: Skin is warm and dry.     Capillary Refill: Capillary refill takes less than 2 seconds.  Neurological:     General: No focal deficit present.     Mental Status: He is alert and oriented to person, place, and time.  Psychiatric:        Mood and Affect: Mood normal.        Behavior: Behavior normal.     ED Results / Procedures / Treatments   Labs (all labs ordered are listed, but only abnormal results are displayed) Labs Reviewed  CBC WITH DIFFERENTIAL/PLATELET - Abnormal; Notable for the following  components:      Result Value   RBC 3.56 (*)    Hemoglobin 11.5 (*)    HCT 35.1 (*)    Lymphs Abs 0.6 (*)    All other components within normal limits  BASIC METABOLIC PANEL - Abnormal; Notable for the following components:   Sodium 127 (*)    Chloride 94 (*)    Glucose, Bld 110 (*)    Calcium 8.3 (*)    All other components within normal limits  RESPIRATORY PANEL BY RT PCR (FLU A&B, COVID)  LACTIC ACID, PLASMA  TROPONIN I (HIGH SENSITIVITY)    EKG EKG Interpretation  Date/Time:  Monday February 03 2020 16:38:49 EDT Ventricular Rate:  83 PR Interval:    QRS Duration: 89 QT Interval:  370 QTC Calculation: 435 R Axis:   -80 Text Interpretation: Sinus rhythm Left anterior fascicular block Low voltage, extremity leads Consider right ventricular hypertrophy Probable lateral infarct, old Confirmed by Madalyn Rob [phone]) on 02/03/2020 6:03:57 PM Also confirmed by Madalyn Rob [phone]), editor Hattie Perch ([id number])  on 02/04/2020 2:39:35 PM   Radiology DG Chest 2 View  Result Date: 02/03/2020 CLINICAL DATA:  Hypotension, shortness of breath EXAM: CHEST - 2 VIEW COMPARISON:  12/20/2019 FINDINGS: Patchy airspace disease in the lower lobes concerning for pneumonia. Heart is normal size. No effusions or pneumothorax. No acute bony abnormality. IMPRESSION: Patchy bilateral lower lobe airspace opacities concerning for pneumonia. Electronically Signed   By: Rolm Baptise M.D.   On: 02/03/2020 17:42    Procedures Procedures (including critical care time)  Medications Ordered in ED Medications  sodium chloride 0.9 % bolus 1,000 mL (0 mLs Intravenous Stopped 02/03/20 1848)    ED Course  I have reviewed the triage vital signs and the nursing notes.  Pertinent labs & imaging results that were available during my care of the patient were reviewed by me and considered in my medical decision making (see chart for details).    MDM Rules/Calculators/A&P                           74 year old male history of lung cancer, had  recently been on chemotherapy but not currently on any chemotherapy agents presented to ER with report of low blood pressure at home.  He had no associated symptoms, no symptoms in ER.  His vital signs in ER noted mildly soft blood pressure but normal MAP.  He was provided gentle fluids and his blood pressure maintained in the 562Z systolic.  Due to his reported episode of hypotension, obtained EKG, basic labs and chest x-ray.  His BMP was notable for mild hyponatremia, reviewed his chest x-ray, per my read and radiology read, he has patchy infiltrates concerning for possible pneumonia.  Due to the hyponatremia and pneumonia, offered admission for closer observation and treatment however patient adamant that he be discharged home, I reviewed the risks and benefits of discharge versus admission.  Given his overall well appearance and lack of symptoms, believe this is a reasonable option but I stressed very specific return precautions and need for follow-up with his primary doctor and oncologist.  Instructed patient to have repeat blood work and a repeat chest x-ray, ideally within the next few days.    After the discussed management above, the patient was determined to be safe for discharge.  The patient was in agreement with this plan and all questions regarding their care were answered.  ED return precautions were discussed and the patient will return to the ED with any significant worsening of condition.   Final Clinical Impression(s) / ED Diagnoses Final diagnoses:  Community acquired pneumonia, unspecified laterality  Hyponatremia    Rx / DC Orders ED Discharge Orders         Ordered    amoxicillin-clavulanate (AUGMENTIN) 875-125 MG tablet  Every 12 hours        02/03/20 2007    doxycycline (VIBRAMYCIN) 100 MG capsule  2 times daily        02/03/20 2007           Lucrezia Starch, MD 02/04/20 [phone]

## 2020-02-07 ENCOUNTER — Other Ambulatory Visit (HOSPITAL_BASED_OUTPATIENT_CLINIC_OR_DEPARTMENT_OTHER): Payer: Self-pay

## 2020-02-07 ENCOUNTER — Other Ambulatory Visit: Payer: Self-pay

## 2020-02-07 ENCOUNTER — Emergency Department (HOSPITAL_BASED_OUTPATIENT_CLINIC_OR_DEPARTMENT_OTHER): Payer: No Typology Code available for payment source

## 2020-02-07 ENCOUNTER — Inpatient Hospital Stay (HOSPITAL_BASED_OUTPATIENT_CLINIC_OR_DEPARTMENT_OTHER)
Admission: EM | Admit: 2020-02-07 | Discharge: 2020-02-10 | DRG: 177 | Disposition: A | Discharge status: Home / Self Care | Payer: No Typology Code available for payment source | Attending: Internal Medicine | Admitting: Internal Medicine

## 2020-02-07 ENCOUNTER — Encounter (HOSPITAL_BASED_OUTPATIENT_CLINIC_OR_DEPARTMENT_OTHER): Payer: Self-pay | Admitting: Emergency Medicine

## 2020-02-07 DIAGNOSIS — F419 Anxiety disorder, unspecified: Secondary | ICD-10-CM | POA: Diagnosis present

## 2020-02-07 DIAGNOSIS — K2270 Barrett's esophagus without dysplasia: Secondary | ICD-10-CM | POA: Diagnosis present

## 2020-02-07 DIAGNOSIS — Z823 Family history of stroke: Secondary | ICD-10-CM

## 2020-02-07 DIAGNOSIS — Z66 Do not resuscitate: Secondary | ICD-10-CM | POA: Diagnosis present

## 2020-02-07 DIAGNOSIS — R0602 Shortness of breath: Secondary | ICD-10-CM

## 2020-02-07 DIAGNOSIS — Z8249 Family history of ischemic heart disease and other diseases of the circulatory system: Secondary | ICD-10-CM

## 2020-02-07 DIAGNOSIS — Z79899 Other long term (current) drug therapy: Secondary | ICD-10-CM

## 2020-02-07 DIAGNOSIS — Z923 Personal history of irradiation: Secondary | ICD-10-CM

## 2020-02-07 DIAGNOSIS — J440 Chronic obstructive pulmonary disease with acute lower respiratory infection: Secondary | ICD-10-CM | POA: Diagnosis present

## 2020-02-07 DIAGNOSIS — K208 Other esophagitis without bleeding: Secondary | ICD-10-CM | POA: Diagnosis present

## 2020-02-07 DIAGNOSIS — J449 Chronic obstructive pulmonary disease, unspecified: Secondary | ICD-10-CM | POA: Diagnosis present

## 2020-02-07 DIAGNOSIS — J1282 Pneumonia due to coronavirus disease 2019: Secondary | ICD-10-CM | POA: Diagnosis present

## 2020-02-07 DIAGNOSIS — Z9049 Acquired absence of other specified parts of digestive tract: Secondary | ICD-10-CM

## 2020-02-07 DIAGNOSIS — C3431 Malignant neoplasm of lower lobe, right bronchus or lung: Secondary | ICD-10-CM | POA: Diagnosis present

## 2020-02-07 DIAGNOSIS — Z87891 Personal history of nicotine dependence: Secondary | ICD-10-CM

## 2020-02-07 DIAGNOSIS — I5032 Chronic diastolic (congestive) heart failure: Secondary | ICD-10-CM | POA: Diagnosis present

## 2020-02-07 DIAGNOSIS — D72819 Decreased white blood cell count, unspecified: Secondary | ICD-10-CM | POA: Diagnosis present

## 2020-02-07 DIAGNOSIS — Z888 Allergy status to other drugs, medicaments and biological substances status: Secondary | ICD-10-CM

## 2020-02-07 DIAGNOSIS — F209 Schizophrenia, unspecified: Secondary | ICD-10-CM | POA: Diagnosis present

## 2020-02-07 DIAGNOSIS — R079 Chest pain, unspecified: Secondary | ICD-10-CM

## 2020-02-07 DIAGNOSIS — R0902 Hypoxemia: Secondary | ICD-10-CM

## 2020-02-07 DIAGNOSIS — U071 COVID-19: Principal | ICD-10-CM | POA: Diagnosis present

## 2020-02-07 DIAGNOSIS — Z8673 Personal history of transient ischemic attack (TIA), and cerebral infarction without residual deficits: Secondary | ICD-10-CM

## 2020-02-07 DIAGNOSIS — J069 Acute upper respiratory infection, unspecified: Secondary | ICD-10-CM | POA: Diagnosis present

## 2020-02-07 DIAGNOSIS — Z7902 Long term (current) use of antithrombotics/antiplatelets: Secondary | ICD-10-CM

## 2020-02-07 DIAGNOSIS — K219 Gastro-esophageal reflux disease without esophagitis: Secondary | ICD-10-CM | POA: Diagnosis present

## 2020-02-07 DIAGNOSIS — I110 Hypertensive heart disease with heart failure: Secondary | ICD-10-CM | POA: Diagnosis present

## 2020-02-07 LAB — CBC
HCT: 36.3 % — ABNORMAL LOW (ref 39.0–52.0)
Hemoglobin: 12.3 g/dL — ABNORMAL LOW (ref 13.0–17.0)
MCH: 33.2 pg (ref 26.0–34.0)
MCHC: 33.9 g/dL (ref 30.0–36.0)
MCV: 98.1 fL (ref 80.0–100.0)
Platelets: 165 10*3/uL (ref 150–400)
RBC: 3.7 MIL/uL — ABNORMAL LOW (ref 4.22–5.81)
RDW: 14.1 % (ref 11.5–15.5)
WBC: 4.1 10*3/uL (ref 4.0–10.5)
nRBC: 0 % (ref 0.0–0.2)

## 2020-02-07 LAB — TROPONIN I (HIGH SENSITIVITY): Troponin I (High Sensitivity): 11 ng/L (ref <18)

## 2020-02-07 LAB — DIFFERENTIAL
Abs Immature Granulocytes: 0.02 10*3/uL (ref 0.00–0.07)
Basophils Absolute: 0 10*3/uL (ref 0.0–0.1)
Basophils Relative: 0 %
Eosinophils Absolute: 0.2 10*3/uL (ref 0.0–0.5)
Eosinophils Relative: 4 %
Immature Granulocytes: 1 %
Lymphocytes Relative: 11 %
Lymphs Abs: 0.5 10*3/uL — ABNORMAL LOW (ref 0.7–4.0)
Monocytes Absolute: 0.5 10*3/uL (ref 0.1–1.0)
Monocytes Relative: 12 %
Neutro Abs: 2.9 10*3/uL (ref 1.7–7.7)
Neutrophils Relative %: 72 %

## 2020-02-07 LAB — D-DIMER, QUANTITATIVE: D-Dimer, Quant: 2.45 ug/mL-FEU — ABNORMAL HIGH (ref 0.00–0.50)

## 2020-02-07 LAB — COMPREHENSIVE METABOLIC PANEL
ALT: 16 U/L (ref 0–44)
AST: 32 U/L (ref 15–41)
Albumin: 3.2 g/dL — ABNORMAL LOW (ref 3.5–5.0)
Alkaline Phosphatase: 66 U/L (ref 38–126)
Anion gap: 11 (ref 5–15)
BUN: 26 mg/dL — ABNORMAL HIGH (ref 8–23)
CO2: 21 mmol/L — ABNORMAL LOW (ref 22–32)
Calcium: 8.4 mg/dL — ABNORMAL LOW (ref 8.9–10.3)
Chloride: 103 mmol/L (ref 98–111)
Creatinine, Ser: 1.1 mg/dL (ref 0.61–1.24)
GFR, Estimated: >60 mL/min (ref >60)
Glucose, Bld: 108 mg/dL — ABNORMAL HIGH (ref 70–99)
Potassium: 4 mmol/L (ref 3.5–5.1)
Sodium: 135 mmol/L (ref 135–145)
Total Bilirubin: 0.6 mg/dL (ref 0.3–1.2)
Total Protein: 7.2 g/dL (ref 6.5–8.1)

## 2020-02-07 LAB — LACTIC ACID, PLASMA: Lactic Acid, Venous: 1.3 mmol/L (ref 0.5–1.9)

## 2020-02-07 LAB — RESPIRATORY PANEL BY RT PCR (FLU A&B, COVID)
Influenza A by PCR: NEGATIVE
Influenza B by PCR: NEGATIVE
SARS Coronavirus 2 by RT PCR: POSITIVE — AB

## 2020-02-07 MED ORDER — IOHEXOL 350 MG/ML SOLN
100.0000 mL | Freq: Once | INTRAVENOUS | Status: AC | PRN
  Administered 2020-02-07: 100 mL via INTRAVENOUS

## 2020-02-07 MED ORDER — METHYLPREDNISOLONE SODIUM SUCC 125 MG IJ SOLR
1.0000 mg/kg | Freq: Two times a day (BID) | INTRAMUSCULAR | Status: AC
  Administered 2020-02-07 – 2020-02-10 (×6): 70 mg via INTRAVENOUS
  Filled 2020-02-07 (×6): qty 2

## 2020-02-07 MED ORDER — SODIUM CHLORIDE 0.9 % IV SOLN
100.0000 mg | Freq: Once | INTRAVENOUS | Status: AC
  Administered 2020-02-08: 100 mg via INTRAVENOUS
  Filled 2020-02-07: qty 20

## 2020-02-07 MED ORDER — SODIUM CHLORIDE 0.9 % IV SOLN: 100.0000 mg | Freq: Every day | INTRAVENOUS | Status: DC

## 2020-02-07 MED ORDER — SODIUM CHLORIDE 0.9 % IV SOLN
200.0000 mg | Freq: Once | INTRAVENOUS | Status: DC
  Filled 2020-02-07: qty 40

## 2020-02-07 MED ORDER — SODIUM CHLORIDE 0.9 % IV BOLUS
1000.0000 mL | Freq: Once | INTRAVENOUS | Status: AC
  Administered 2020-02-07: 1000 mL via INTRAVENOUS

## 2020-02-07 MED ORDER — SODIUM CHLORIDE 0.9 % IV SOLN
100.0000 mg | Freq: Every day | INTRAVENOUS | Status: DC
  Administered 2020-02-08 – 2020-02-10 (×3): 100 mg via INTRAVENOUS
  Filled 2020-02-07 (×3): qty 20

## 2020-02-07 MED ORDER — SODIUM CHLORIDE 0.9 % IV SOLN
100.0000 mg | Freq: Once | INTRAVENOUS | Status: AC
  Administered 2020-02-07: 100 mg via INTRAVENOUS
  Filled 2020-02-07: qty 20

## 2020-02-07 MED ORDER — PREDNISONE 20 MG PO TABS: 50.0000 mg | ORAL_TABLET | Freq: Every day | ORAL | Status: DC

## 2020-02-07 NOTE — ED Triage Notes (Addendum)
Cough , congestion and sob  Not feeling good , seen several days ago for same dx with pneumonia has hx of cancer lung  Wife was dx with covid last week and pt had neg test last week

## 2020-02-07 NOTE — ED Notes (Addendum)
Currently taking Cough medication and omeprazole, no HTN meds. Last Immunotherapy 3 weeks ago at [facility]

## 2020-02-07 NOTE — ED Notes (Addendum)
Pt started Doxycycline 100 mg and Amoxicillin 125mg  1 tab day for 7 days twice daily since Tuesday for PNA with same symptoms as today. Sent her by home health nurse for Fever and hypotension 99/70 and HR 116. BP now 110/82 HR 106

## 2020-02-07 NOTE — ED Notes (Signed)
Patient transported to CT 

## 2020-02-07 NOTE — ED Provider Notes (Signed)
[place] EMERGENCY DEPARTMENT Provider Note   CSN: [id number] Arrival date & time: 02/07/20  1655     History Chief Complaint  Patient presents with  . Chest Pain  . Nasal Congestion  . Shortness of Breath    Melvin Foley is a 74 y.o. male.  The history is provided by the patient, a relative and medical records. No language interpreter was used.  Shortness of Breath Severity:  Severe Onset quality:  Gradual Duration:  5 days Timing:  Constant Progression:  Worsening Chronicity:  New Context: URI   Relieved by:  Nothing Worsened by:  Coughing Ineffective treatments:  None tried Associated symptoms: chest pain, cough, fever and sputum production   Associated symptoms: no abdominal pain, no headaches, no neck pain, no rash, no vomiting and no wheezing   Risk factors: hx of cancer        Past Medical History:  Diagnosis Date  . Anxiety    takes medication  . Arthritis   . Bipolar 1 disorder ([place])   . Bronchitis   . Cancer ([place])   . Carotid stenosis, left   . GERD (gastroesophageal reflux disease)   . H/O hiatal hernia   . Hypertension   . Shortness of breath   . Sleep apnea    2 YEARS  [place]   . Stroke [facility])     Patient Active Problem List   Diagnosis Date Noted  . 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 Fever 12/21/2019  . 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 Bipolar disorder ([place]) 12/21/2019  . Neutropenia ([place]) 12/15/2019  . Neutropenic fever ([place]) 12/14/2019  . Carotid stenosis 05/03/2017  . Difficulty with speech   . TIA (transient ischemic attack) 04/30/2017  . Hyperlipidemia 04/30/2017  . Proximal humerus fracture 03/02/2011    Past Surgical History:  Procedure Laterality Date  . BIOPSY  12/25/2019   Procedure: BIOPSY;  Surgeon: Rush Landmark Telford Nab., MD;  Location: [place];  Service: Gastroenterology;;  . CHOLECYSTECTOMY    . ENDARTERECTOMY Left 05/03/2017   Procedure:  ENDARTERECTOMY CAROTID LEFT;  Surgeon: Rosetta Posner, MD;  [facility];  Service: Vascular;  Laterality: Left;  . ESOPHAGOGASTRODUODENOSCOPY (EGD) WITH PROPOFOL N/A 12/25/2019   Procedure: ESOPHAGOGASTRODUODENOSCOPY (EGD) WITH PROPOFOL;  Surgeon: Irving Copas., MD;  Location: [place];  Service: Gastroenterology;  Laterality: N/A;  . FRACTURE SURGERY     right ankle  . head injury    . KNEE SURGERY     x2  . ORIF SHOULDER FRACTURE  03/03/2011   Procedure: OPEN REDUCTION INTERNAL FIXATION (ORIF) SHOULDER FRACTURE;  Surgeon: Augustin Schooling;  Location: [place];  Service: Orthopedics;  Laterality: Left;  LEFT PROXIMAL HUMERUS Open Reduction internal fixation  . SHOULDER ARTHROSCOPY WITH SUBACROMIAL DECOMPRESSION AND BICEP TENDON REPAIR  02/24/2012   Procedure: SHOULDER ARTHROSCOPY WITH SUBACROMIAL DECOMPRESSION AND BICEP TENDON REPAIR;  Surgeon: Augustin Schooling, MD;  Location: [place];  Service: Orthopedics;  Laterality: Left;  with capsular release and lysis of adhesions  . TONSILLECTOMY         Family History  Problem Relation Age of Onset  . Congestive Heart Failure Mother   . Stroke Mother   . Congestive Heart Failure Father     Social History   Tobacco Use  . Smoking status: Former Smoker    Packs/day: 1.50  . Smokeless tobacco: Never Used  . Tobacco comment: stopped smoking 20 years ago  Vaping Use  .  Vaping Use: Never used  Substance Use Topics  . Alcohol use: No    Comment: past 20 years ago  . Drug use: Not Currently    Types: Marijuana, LSD    Home Medications Prior to Admission medications   Medication Sig Start Date End Date Taking? Authorizing Provider  acetaminophen (TYLENOL) 500 MG tablet Take 1,000 mg by mouth every 6 (six) hours as needed for mild pain.    [provider]  albuterol (VENTOLIN HFA) 108 (90 Base) MCG/ACT inhaler Inhale 1-2 puffs into the lungs every 6 (six) hours as needed for wheezing or shortness of breath.    [provider]  amoxicillin-clavulanate (AUGMENTIN) 875-125 MG tablet Take 1 tablet by mouth every 12 (twelve) hours for 7 days. 02/03/20 02/10/20  Lucrezia Starch, MD  clopidogrel (PLAVIX) 75 MG tablet Take 1 tablet (75 mg total) by mouth at bedtime. 05/01/17   Geradine Girt, DO  doxycycline (VIBRAMYCIN) 100 MG capsule Take 1 capsule (100 mg total) by mouth 2 (two) times daily for 7 days. 02/03/20 02/10/20  Lucrezia Starch, MD  fluticasone (FLONASE) 50 MCG/ACT nasal spray Place 2 sprays into both nostrils daily.  01/08/19   [provider]  guaiFENesin (MUCINEX) 600 MG 12 hr tablet Take 600 mg by mouth 2 (two) times daily.  08/06/19   [provider]  HYDROcodone-acetaminophen (NORCO) 10-325 MG tablet Take 1 tablet by mouth every 6 (six) hours as needed for moderate pain or severe pain. 12/27/19   Arrien, Jimmy Picket, MD  hydrocortisone cream 1 % Apply topically 2 (two) times daily. 12/17/19   Nita Sells, MD  pantoprazole sodium (PROTONIX) 40 mg/20 mL PACK Take 20 mLs (40 mg total) by mouth 2 (two) times daily. 12/27/19 01/26/20  Arrien, Jimmy Picket, MD  sildenafil (VIAGRA) 100 MG tablet Take 100 mg by mouth as needed for erectile dysfunction.  04/04/19   [provider]  sucralfate (CARAFATE) 1 GM/10ML suspension Take 10 mLs (1 g total) by mouth 4 (four) times daily -  with meals and at bedtime. 12/25/19 01/24/20  Arrien, Jimmy Picket, MD  thiothixene (NAVANE) 2 MG capsule Take 2 mg by mouth 3 (three) times daily.     [provider]    Allergies    Patient has no known allergies.  Review of Systems   Review of Systems  Constitutional: Positive for chills, fatigue and fever.  HENT: Positive for congestion.   Eyes: Negative for visual disturbance.  Respiratory: Positive for cough, sputum production, chest tightness and shortness of breath. Negative for wheezing.   Cardiovascular: Positive for chest pain.  Gastrointestinal: Positive for  diarrhea. Negative for abdominal distention, abdominal pain, constipation, nausea and vomiting.  Genitourinary: Negative for flank pain and genital sores.  Musculoskeletal: Negative for back pain, neck pain and neck stiffness.  Skin: Negative for rash and wound.  Neurological: Positive for light-headedness. Negative for dizziness, weakness and headaches.  Psychiatric/Behavioral: Negative for agitation and confusion.  All other systems reviewed and are negative.   Physical Exam Updated Vital Signs BP 99/71   Pulse (!) 103   Temp (!) 100.8 F (38.2 C) (Oral)   Resp (!) 24   SpO2 94%   Physical Exam Vitals and nursing note reviewed.  Constitutional:      General: He is not in acute distress.    Appearance: He is well-developed. He is ill-appearing. He is not toxic-appearing or diaphoretic.  HENT:     Head: Normocephalic and atraumatic.  Eyes:     Conjunctiva/sclera: Conjunctivae normal.     Pupils: Pupils are equal, round, and reactive to light.  Cardiovascular:     Rate and Rhythm: Regular rhythm. Tachycardia present.     Heart sounds: Normal heart sounds. No murmur heard.   Pulmonary:     Effort: Pulmonary effort is normal. Tachypnea present. No respiratory distress.     Breath sounds: Rhonchi present. No decreased breath sounds, wheezing or rales.  Chest:     Chest wall: No tenderness.  Abdominal:     Palpations: Abdomen is soft.     Tenderness: There is no abdominal tenderness.  Musculoskeletal:     Cervical back: Neck supple.     Right lower leg: No tenderness. No edema.     Left lower leg: No tenderness. No edema.  Skin:    General: Skin is warm and dry.     Capillary Refill: Capillary refill takes less than 2 seconds.     Findings: No erythema.  Neurological:     Mental Status: He is alert.  Psychiatric:        Mood and Affect: Mood normal.     ED Results / Procedures / Treatments   Labs (all labs ordered are listed, but only abnormal results are  displayed) Labs Reviewed  RESPIRATORY PANEL BY RT PCR (FLU A&B, COVID) - Abnormal; Notable for the following components:      Result Value   SARS Coronavirus 2 by RT PCR POSITIVE (*)    All other components within normal limits  CBC - Abnormal; Notable for the following components:   RBC 3.70 (*)    Hemoglobin 12.3 (*)    HCT 36.3 (*)    All other components within normal limits  COMPREHENSIVE METABOLIC PANEL - Abnormal; Notable for the following components:   CO2 21 (*)    Glucose, Bld 108 (*)    BUN 26 (*)    Calcium 8.4 (*)    Albumin 3.2 (*)    All other components within normal limits  DIFFERENTIAL - Abnormal; Notable for the following components:   Lymphs Abs 0.5 (*)    All other components within normal limits  D-DIMER, QUANTITATIVE (NOT AT [facility]) - Abnormal; Notable for the following components:   D-Dimer, Quant 2.45 (*)    All other components within normal limits  CULTURE, BLOOD (ROUTINE X 2)  CULTURE, BLOOD (ROUTINE X 2)  LACTIC ACID, PLASMA  LACTIC ACID, PLASMA  CBC WITH DIFFERENTIAL/PLATELET  PROCALCITONIN  TROPONIN I (HIGH SENSITIVITY)  TROPONIN I (HIGH SENSITIVITY)    EKG None  Radiology CT Angio Chest PE W and/or Wo Contrast  Result Date: 02/07/2020 CLINICAL DATA:  Positive D-dimer, shortness of breath, tachycardia. EXAM: CT ANGIOGRAPHY CHEST WITH CONTRAST TECHNIQUE: Multidetector CT imaging of the chest was performed using the standard protocol during bolus administration of intravenous contrast. Multiplanar CT image reconstructions and MIPs were obtained to evaluate the vascular anatomy. CONTRAST:  121mL OMNIPAQUE IOHEXOL 350 MG/ML SOLN COMPARISON:  12/20/2019 FINDINGS: Cardiovascular: No filling defects in the pulmonary arteries to suggest pulmonary emboli. Heart is normal size. Aortic and coronary artery calcifications. No aneurysm. Mediastinum/Nodes: Stable borderline sized right hilar lymph node. Scattered small and borderline sized mediastinal lymph  nodes, stable. Trachea and esophagus are unremarkable. Thyroid unremarkable. Lungs/Pleura: Trace right pleural effusion. Masslike consolidation in the right lower lobe is again noted and is stable since prior study. New rounded masslike area of consolidation in the right upper lobe measuring 2.5 cm. Increasing  airspace disease posteriorly and peripherally in the right upper lobe and superior segment of the right lower lobe. Increasing ground-glass airspace opacity posteriorly in the superior segment of the left lower lobe. Paraseptal emphysema. Peripheral reticulation in the lungs bilaterally compatible with fibrosis. None none Upper Abdomen: Imaging into the upper abdomen demonstrates no acute findings. Musculoskeletal: Chest wall soft tissues are unremarkable. No acute bony abnormality. Review of the MIP images confirms the above findings. IMPRESSION: Rounded area of masslike consolidation in the right lower lobe with cavitation again noted, unchanged. New airspace disease seen in the right upper lobe and right lower lobe as well as superior segment of the left lower lobe. These areas are concerning for pneumonia. Areas of peripheral interstitial prominence and fibrosis bilaterally. Right hilar and borderline mediastinal lymph nodes are stable. Coronary artery disease. Aortic Atherosclerosis (ICD10-I70.0). Electronically Signed   By: Rolm Baptise M.D.   On: 02/07/2020 20:24   DG Chest Portable 1 View  Result Date: 02/07/2020 CLINICAL DATA:  Shortness of breath, cough, congestion EXAM: PORTABLE CHEST 1 VIEW COMPARISON:  02/03/2020 FINDINGS: Airspace disease in the right mid and lower lung again noted, most confluent at the right lung base compatible with pneumonia. Heart is normal size. Left lung clear. Previously seen patchy opacities at the left base have resolved. No effusions or acute bony abnormality. IMPRESSION: Continued patchy right mid and lower lung airspace opacities concerning for pneumonia.  Clearing of the left basilar opacities since prior study. Electronically Signed   By: Rolm Baptise M.D.   On: 02/07/2020 18:51    Procedures Procedures (including critical care time)  CRITICAL CARE Performed by: Gwenyth Allegra Wanetta Funderburke Total critical care time: 35 minutes Critical care time was exclusive of separately billable procedures and treating other patients. Critical care was necessary to treat or prevent imminent or life-threatening deterioration. Critical care was time spent personally by me on the following activities: development of treatment plan with patient and/or surrogate as well as nursing, discussions with consultants, evaluation of patient's response to treatment, examination of patient, obtaining history from patient or surrogate, ordering and performing treatments and interventions, ordering and review of laboratory studies, ordering and review of radiographic studies, pulse oximetry and re-evaluation of patient's condition.  Melvin Foley was evaluated in Emergency Department on 02/07/2020 for the symptoms described in the history of present illness. He was evaluated in the context of the global COVID-19 pandemic, which necessitated consideration that the patient might be at risk for infection with the SARS-CoV-2 virus that causes COVID-19. Institutional protocols and algorithms that pertain to the evaluation of patients at risk for COVID-19 are in a state of rapid change based on information released by regulatory bodies including the CDC and federal and state organizations. These policies and algorithms were followed during the patient's care in the ED.   Medications Ordered in ED Medications  methylPREDNISolone sodium succinate (SOLU-MEDROL) 125 mg/2 mL injection 70 mg (has no administration in time range)    Followed by  predniSONE (DELTASONE) tablet 50 mg (has no administration in time range)  remdesivir 100 mg in sodium chloride 0.9 % 100 mL IVPB (has no  administration in time range)    Followed by  remdesivir 100 mg in sodium chloride 0.9 % 100 mL IVPB (has no administration in time range)    Followed by  remdesivir 100 mg in sodium chloride 0.9 % 100 mL IVPB (has no administration in time range)  sodium chloride 0.9 % bolus 1,000 mL (0 mLs Intravenous  Stopped 02/07/20 2038)  iohexol (OMNIPAQUE) 350 MG/ML injection 100 mL (100 mLs Intravenous Contrast Given 02/07/20 2001)    ED Course  I have reviewed the triage vital signs and the nursing notes.  Pertinent labs & imaging results that were available during my care of the patient were reviewed by me and considered in my medical decision making (see chart for details).    MDM Rules/Calculators/A&P                          Melvin Foley is a 74 y.o. male with a past medical history significant for prior TIA and stroke, hyperlipidemia, bipolar disorder, hypertension, GERD, COPD, carotid disease, lung cancer status post completion of radiation and chemotherapy now on immunotherapy with recent diagnosis of pneumonia several days ago who presents with worsened chest pain, shortness of breath, productive cough, fevers, chills, malaise, fatigue, and diarrhea.  Patient reports that he was seen several days ago and was told he had pneumonia.  At that time, they were concerned he needed admission for IV antibiotics however patient refused and went home with doxycycline and Augmentin.  He reports he has been taking them but symptoms have only continued to worsen.  Reports he is having chest pain and shortness of breath that is very pleuritic.  He is across his entire chest.  He denies any history of DVT or PE but does have active cancer.  He reports that a home health nurse came to see him today and found his oxygen saturation to 84 so they sent him in for evaluation.  He reports that his wife was diagnosed with Covid last week but when he was tested several days ago was negative.  He reports he has not  been vaccinated.  On arrival, patient is tachycardic, tachypneic, febrile, and has soft blood pressures in the 51V systolic.  Oxygen saturation has been 90 and 94% on room air during my initial evaluation.  On exam, patient has rhonchi and coarse breath sounds in all lung fields.  Lateral chest was tender to palpation bilaterally.  Anterior chest was nontender.  No murmur.  Abdomen nontender.  Patient is tachypneic but otherwise resting.  Clinical I am concerned about either Covid versus worsening pneumonia failing outpatient antibiotic treatment.  With his documented hypoxia at home, we will ambulate him to see if he gets hypoxic here.  Patient initially is hesitant to admission however he agreed to let us get imaging and labs, repeat Covid test, and further work-up before having the discussion of admission.  He will be given fluids for tachycardia and soft pressures.  He also reports he had diarrhea as this may lead to dehydration.  Anticipate admission after work-up.  CT PE study was ordered after dimer was positive.  No evidence of pulmonary embolism.  Covid test was positive, suspect Covid causing the hypoxia, shortness of breath, chest discomfort, and soft blood pressures.  He was given more fluids with some improvement into the 90s of his blood pressure.  Due to the failure of outpatient management with hypoxia at home, chest pain shortness of breath and hypotension here, we do not feel safe with him going home.  As he is getting immunotherapy for his cancer and now has Covid, will admit for further management.  Hospitalist will order steroids and remdesivir and patient be admitted for further management of Covid.   Final Clinical Impression(s) / ED Diagnoses Final diagnoses:  COVID-19  Hypoxia  Chest  pain, unspecified type  Shortness of breath     Clinical Impression: 1. COVID-19   2. Hypoxia   3. Chest pain, unspecified type   4. Shortness of breath     Disposition:  Admit  This note was prepared with assistance of Dragon voice recognition software. Occasional wrong-word or sound-a-like substitutions may have occurred due to the inherent limitations of voice recognition software.     Corney Knighton, Gwenyth Allegra, MD 02/07/20 [phone]

## 2020-02-08 ENCOUNTER — Encounter (HOSPITAL_COMMUNITY): Payer: Self-pay | Admitting: Internal Medicine

## 2020-02-08 ENCOUNTER — Other Ambulatory Visit: Payer: Self-pay

## 2020-02-08 ENCOUNTER — Telehealth (HOSPITAL_COMMUNITY): Payer: Self-pay | Admitting: Emergency Medicine

## 2020-02-08 DIAGNOSIS — U071 COVID-19: Secondary | ICD-10-CM | POA: Diagnosis present

## 2020-02-08 DIAGNOSIS — Z66 Do not resuscitate: Secondary | ICD-10-CM | POA: Diagnosis present

## 2020-02-08 DIAGNOSIS — C3431 Malignant neoplasm of lower lobe, right bronchus or lung: Secondary | ICD-10-CM

## 2020-02-08 DIAGNOSIS — Z8249 Family history of ischemic heart disease and other diseases of the circulatory system: Secondary | ICD-10-CM | POA: Diagnosis not present

## 2020-02-08 DIAGNOSIS — Z87891 Personal history of nicotine dependence: Secondary | ICD-10-CM | POA: Diagnosis not present

## 2020-02-08 DIAGNOSIS — Z923 Personal history of irradiation: Secondary | ICD-10-CM | POA: Diagnosis not present

## 2020-02-08 DIAGNOSIS — I110 Hypertensive heart disease with heart failure: Secondary | ICD-10-CM | POA: Diagnosis present

## 2020-02-08 DIAGNOSIS — Z7902 Long term (current) use of antithrombotics/antiplatelets: Secondary | ICD-10-CM | POA: Diagnosis not present

## 2020-02-08 DIAGNOSIS — R7989 Other specified abnormal findings of blood chemistry: Secondary | ICD-10-CM | POA: Diagnosis not present

## 2020-02-08 DIAGNOSIS — I5032 Chronic diastolic (congestive) heart failure: Secondary | ICD-10-CM | POA: Diagnosis present

## 2020-02-08 DIAGNOSIS — Z9049 Acquired absence of other specified parts of digestive tract: Secondary | ICD-10-CM | POA: Diagnosis not present

## 2020-02-08 DIAGNOSIS — F209 Schizophrenia, unspecified: Secondary | ICD-10-CM | POA: Diagnosis present

## 2020-02-08 DIAGNOSIS — D72819 Decreased white blood cell count, unspecified: Secondary | ICD-10-CM | POA: Diagnosis present

## 2020-02-08 DIAGNOSIS — R0902 Hypoxemia: Secondary | ICD-10-CM | POA: Diagnosis present

## 2020-02-08 DIAGNOSIS — J069 Acute upper respiratory infection, unspecified: Secondary | ICD-10-CM

## 2020-02-08 DIAGNOSIS — K208 Other esophagitis without bleeding: Secondary | ICD-10-CM | POA: Diagnosis present

## 2020-02-08 DIAGNOSIS — K219 Gastro-esophageal reflux disease without esophagitis: Secondary | ICD-10-CM | POA: Diagnosis present

## 2020-02-08 DIAGNOSIS — J449 Chronic obstructive pulmonary disease, unspecified: Secondary | ICD-10-CM

## 2020-02-08 DIAGNOSIS — J1282 Pneumonia due to coronavirus disease 2019: Secondary | ICD-10-CM | POA: Diagnosis present

## 2020-02-08 DIAGNOSIS — K2270 Barrett's esophagus without dysplasia: Secondary | ICD-10-CM | POA: Diagnosis present

## 2020-02-08 DIAGNOSIS — Z79899 Other long term (current) drug therapy: Secondary | ICD-10-CM | POA: Diagnosis not present

## 2020-02-08 DIAGNOSIS — Z888 Allergy status to other drugs, medicaments and biological substances status: Secondary | ICD-10-CM | POA: Diagnosis not present

## 2020-02-08 DIAGNOSIS — Z8673 Personal history of transient ischemic attack (TIA), and cerebral infarction without residual deficits: Secondary | ICD-10-CM | POA: Diagnosis not present

## 2020-02-08 DIAGNOSIS — J440 Chronic obstructive pulmonary disease with acute lower respiratory infection: Secondary | ICD-10-CM | POA: Diagnosis present

## 2020-02-08 DIAGNOSIS — Z823 Family history of stroke: Secondary | ICD-10-CM | POA: Diagnosis not present

## 2020-02-08 DIAGNOSIS — F419 Anxiety disorder, unspecified: Secondary | ICD-10-CM | POA: Diagnosis present

## 2020-02-08 DIAGNOSIS — T66XXXA Radiation sickness, unspecified, initial encounter: Secondary | ICD-10-CM

## 2020-02-08 LAB — CBC WITH DIFFERENTIAL/PLATELET
Abs Immature Granulocytes: 0 10*3/uL (ref 0.00–0.07)
Basophils Absolute: 0 10*3/uL (ref 0.0–0.1)
Basophils Relative: 0 %
Eosinophils Absolute: 0 10*3/uL (ref 0.0–0.5)
Eosinophils Relative: 0 %
HCT: 34.9 % — ABNORMAL LOW (ref 39.0–52.0)
Hemoglobin: 11.6 g/dL — ABNORMAL LOW (ref 13.0–17.0)
Immature Granulocytes: 0 %
Lymphocytes Relative: 23 %
Lymphs Abs: 0.3 10*3/uL — ABNORMAL LOW (ref 0.7–4.0)
MCH: 32.4 pg (ref 26.0–34.0)
MCHC: 33.2 g/dL (ref 30.0–36.0)
MCV: 97.5 fL (ref 80.0–100.0)
Monocytes Absolute: 0.1 10*3/uL (ref 0.1–1.0)
Monocytes Relative: 7 %
Neutro Abs: 0.8 10*3/uL — ABNORMAL LOW (ref 1.7–7.7)
Neutrophils Relative %: 70 %
Platelets: 159 10*3/uL (ref 150–400)
RBC: 3.58 MIL/uL — ABNORMAL LOW (ref 4.22–5.81)
RDW: 13.8 % (ref 11.5–15.5)
WBC: 1.2 10*3/uL — CL (ref 4.0–10.5)
nRBC: 0 % (ref 0.0–0.2)

## 2020-02-08 LAB — COMPREHENSIVE METABOLIC PANEL
ALT: 16 U/L (ref 0–44)
AST: 29 U/L (ref 15–41)
Albumin: 2.7 g/dL — ABNORMAL LOW (ref 3.5–5.0)
Alkaline Phosphatase: 61 U/L (ref 38–126)
Anion gap: 10 (ref 5–15)
BUN: 17 mg/dL (ref 8–23)
CO2: 21 mmol/L — ABNORMAL LOW (ref 22–32)
Calcium: 8.4 mg/dL — ABNORMAL LOW (ref 8.9–10.3)
Chloride: 105 mmol/L (ref 98–111)
Creatinine, Ser: 0.8 mg/dL (ref 0.61–1.24)
GFR, Estimated: >60 mL/min (ref >60)
Glucose, Bld: 152 mg/dL — ABNORMAL HIGH (ref 70–99)
Potassium: 4 mmol/L (ref 3.5–5.1)
Sodium: 136 mmol/L (ref 135–145)
Total Bilirubin: 0.7 mg/dL (ref 0.3–1.2)
Total Protein: 6.7 g/dL (ref 6.5–8.1)

## 2020-02-08 LAB — D-DIMER, QUANTITATIVE: D-Dimer, Quant: 1.87 ug/mL-FEU — ABNORMAL HIGH (ref 0.00–0.50)

## 2020-02-08 LAB — MAGNESIUM: Magnesium: 1.5 mg/dL — ABNORMAL LOW (ref 1.7–2.4)

## 2020-02-08 LAB — C-REACTIVE PROTEIN: CRP: 5.6 mg/dL — ABNORMAL HIGH (ref <1.0)

## 2020-02-08 LAB — PROCALCITONIN: Procalcitonin: 0.21 ng/mL

## 2020-02-08 MED ORDER — ENSURE ENLIVE PO LIQD
237.0000 mL | Freq: Two times a day (BID) | ORAL | Status: DC
  Administered 2020-02-09 – 2020-02-10 (×3): 237 mL via ORAL
  Filled 2020-02-08 (×4): qty 237

## 2020-02-08 MED ORDER — HYDROCODONE-ACETAMINOPHEN 10-325 MG PO TABS: 1.0000 | ORAL_TABLET | Freq: Four times a day (QID) | ORAL | Status: DC | PRN

## 2020-02-08 MED ORDER — PANTOPRAZOLE SODIUM 40 MG PO TBEC
40.0000 mg | DELAYED_RELEASE_TABLET | Freq: Two times a day (BID) | ORAL | Status: DC
  Administered 2020-02-08 – 2020-02-10 (×5): 40 mg via ORAL
  Filled 2020-02-08 (×5): qty 1

## 2020-02-08 MED ORDER — GUAIFENESIN-DM 100-10 MG/5ML PO SYRP
10.0000 mL | ORAL_SOLUTION | ORAL | Status: DC | PRN
  Administered 2020-02-09: 10 mL via ORAL
  Filled 2020-02-08: qty 10

## 2020-02-08 MED ORDER — LACTATED RINGERS IV SOLN: INTRAVENOUS | Status: AC

## 2020-02-08 MED ORDER — ONDANSETRON HCL 4 MG/2ML IJ SOLN: 4.0000 mg | Freq: Four times a day (QID) | INTRAMUSCULAR | Status: DC | PRN

## 2020-02-08 MED ORDER — ONDANSETRON HCL 4 MG PO TABS: 4.0000 mg | ORAL_TABLET | Freq: Four times a day (QID) | ORAL | Status: DC | PRN

## 2020-02-08 MED ORDER — FLUTICASONE PROPIONATE 50 MCG/ACT NA SUSP
2.0000 | Freq: Every day | NASAL | Status: DC
  Administered 2020-02-08 – 2020-02-10 (×3): 2 via NASAL
  Filled 2020-02-08 (×2): qty 16

## 2020-02-08 MED ORDER — ASCORBIC ACID 500 MG PO TABS
500.0000 mg | ORAL_TABLET | Freq: Every day | ORAL | Status: DC
  Administered 2020-02-08 – 2020-02-10 (×3): 500 mg via ORAL
  Filled 2020-02-08 (×3): qty 1

## 2020-02-08 MED ORDER — ACETAMINOPHEN 325 MG PO TABS
650.0000 mg | ORAL_TABLET | Freq: Four times a day (QID) | ORAL | Status: DC | PRN
  Administered 2020-02-09: 650 mg via ORAL
  Filled 2020-02-08: qty 2

## 2020-02-08 MED ORDER — GUAIFENESIN ER 600 MG PO TB12
600.0000 mg | ORAL_TABLET | Freq: Two times a day (BID) | ORAL | Status: DC
  Administered 2020-02-08 – 2020-02-10 (×5): 600 mg via ORAL
  Filled 2020-02-08 (×5): qty 1

## 2020-02-08 MED ORDER — ALBUTEROL SULFATE HFA 108 (90 BASE) MCG/ACT IN AERS
2.0000 | INHALATION_SPRAY | RESPIRATORY_TRACT | Status: DC | PRN
  Administered 2020-02-08 – 2020-02-09 (×3): 2 via RESPIRATORY_TRACT
  Filled 2020-02-08: qty 6.7

## 2020-02-08 MED ORDER — CLOPIDOGREL BISULFATE 75 MG PO TABS
75.0000 mg | ORAL_TABLET | Freq: Every day | ORAL | Status: DC
  Administered 2020-02-08 – 2020-02-09 (×2): 75 mg via ORAL
  Filled 2020-02-08 (×2): qty 1

## 2020-02-08 MED ORDER — ZINC SULFATE 220 (50 ZN) MG PO CAPS
220.0000 mg | ORAL_CAPSULE | Freq: Every day | ORAL | Status: DC
  Administered 2020-02-08 – 2020-02-10 (×3): 220 mg via ORAL
  Filled 2020-02-08 (×3): qty 1

## 2020-02-08 MED ORDER — THIOTHIXENE 1 MG PO CAPS
2.0000 mg | ORAL_CAPSULE | Freq: Three times a day (TID) | ORAL | Status: DC
  Administered 2020-02-08 – 2020-02-10 (×7): 2 mg via ORAL
  Filled 2020-02-08 (×3): qty 2
  Filled 2020-02-08: qty 1
  Filled 2020-02-08 (×7): qty 2

## 2020-02-08 MED ORDER — POLYETHYLENE GLYCOL 3350 17 G PO PACK: 17.0000 g | PACK | Freq: Every day | ORAL | Status: DC | PRN

## 2020-02-08 MED ORDER — SODIUM CHLORIDE 0.9 % IV SOLN
INTRAVENOUS | Status: DC | PRN
  Administered 2020-02-08: 1000 mL via INTRAVENOUS

## 2020-02-08 MED ORDER — ENOXAPARIN SODIUM 40 MG/0.4ML ~~LOC~~ SOLN
40.0000 mg | Freq: Every day | SUBCUTANEOUS | Status: DC
  Administered 2020-02-08 – 2020-02-09 (×2): 40 mg via SUBCUTANEOUS
  Filled 2020-02-08 (×2): qty 0.4

## 2020-02-08 NOTE — Progress Notes (Signed)
Initial Nutrition Assessment  INTERVENTION:   -Ensure Enlive po BID, each supplement provides 350 kcal and 20 grams of protein -Magic cup BID with meals, each supplement provides 290 kcal and 9 grams of protein -Hormel Shake BID with meals, each supplement provides 520 kcals and 22 grams protein   NUTRITION DIAGNOSIS:   Increased nutrient needs related to cancer and cancer related treatments (COVID-19 infection) as evidenced by estimated needs.  GOAL:   Patient will meet greater than or equal to 90% of their needs  MONITOR:   PO intake, Supplement acceptance, Labs, Weight trends, I & O's  REASON FOR ASSESSMENT:   Malnutrition Screening Tool    ASSESSMENT:   74 year old male with past medical history of stage IIIB adenocarcinoma of the right lung, chronic diastolic congestive heart failure, obstructive sleep apnea, schizophrenia, COPD and radiation esophagitis who presents to [facility] as a transfer from [facility] after the patient presented with complaints of shortness of breath.  Patient had been positive for COVID-19 since 10/22. Pt with history of lung cancer, has completed chemotherapy and radiation. Main nutrition impact symptom from COVID-19 has been diarrhea. Pt currently consuming 0% of meals. Will order protein supplements given increased needs from cancer treatments and now active COVID-19 infection.  Per weight records, pt has lost 15 lbs since 5/13 (9% wt loss x 5.5 months, significant for time frame).  Medications: Vitamin C, Zinc sulfate, Lactated ringers Labs reviewed: Low Mg  NUTRITION - FOCUSED PHYSICAL EXAM:  Unable to complete  Diet Order:   Diet Order            Diet Heart Room service appropriate? Yes; Fluid consistency: Thin  Diet effective now                 EDUCATION NEEDS:   No education needs have been identified at this time  Skin:  Skin Assessment: Reviewed RN Assessment  Last BM:  10/22  Height:   Ht  Readings from Last 1 Encounters:  02/08/20 5\' 8"  (1.727 m)    Weight:   Wt Readings from Last 1 Encounters:  02/08/20 67.1 kg   BMI:  Body mass index is 22.5 kg/m.  Estimated Nutritional Needs:   Kcal:  2100-2300  Protein:  110-125g  Fluid:  2L/day  Clayton Bibles, MS, RD, LDN Inpatient Clinical Dietitian Contact information available via Amion

## 2020-02-08 NOTE — Progress Notes (Signed)
CRITICAL VALUE ALERT  Critical Value:  Wbc 1.2  Date & Time Notied:  02/08/20 at 20   Provider Notified: Dr. Sloan Leiter notified

## 2020-02-08 NOTE — Progress Notes (Addendum)
PROGRESS NOTE                                                                                                                                                                                                             Patient Demographics:    Melvin Foley, is a 74 y.o. male, DOB - 04/26/1945, DEY:[id number]  Outpatient Primary MD for the patient is Melvin Char, FNP   Admit date - 02/07/2020   LOS - 0  Chief Complaint  Patient presents with  . Chest Pain  . Nasal Congestion  . Shortness of Breath       Brief Narrative: Patient is a 74 y.o. male with PMHx of stage IIIb adenocarcinoma-finished radiation approximately 6 weeks back (follows at [facility])-wife with recent Covid 19 infection-presented with cough, fever, exertional dyspnea for 3-4 days.  Found to have COVID-19 pneumonia and admitted to the hospitalist service.  COVID-19 vaccinated status: Unvaccinated (counseled-still skeptical about the need of vaccination-claims ENTIRE family not vaccinated!)  Significant Events: 10/18>> evaluated at [facility] for hypotension/possible pneumonia-refused admission-Covid 19 negative 10/22>> presented to [facility] with fever/cough/exertional dyspnea-COVID-19 positive-pneumonia on CT chest.    Significant studies: 10/22>>Chest x-ray: Patchy right mid/lower lung opacities concerning for pneumonia 10/22>> CTA chest: New airspace disease in the right upper/right lower lobe/left lower lobe-rounded area of masslike consolidation in the right lower lobe with cavitation again noted.  COVID-19 medications: Steroids: 10/22>> Remdesivir: 10/22>>  Antibiotics: None  Microbiology data: 10/22 >>blood culture: No growth  Procedures: None  Consults: None  DVT prophylaxis: enoxaparin (LOVENOX) injection 40 mg Start: 02/08/20 1000    Subjective:    Melvin Foley today feels essentially unchanged-stable at  rest-complains of some cough.  On room air this morning.  Acknowledges shortness of breath with ambulation.   Assessment  & Plan :   Covid 19 Viral pneumonia: Appears stable-not hypoxic at rest-but does acknowledge exertional dyspnea-very early into COVID-19 illness-and at high risk to progress to severe disease given his medical comorbidities.  Continue steroid/Remdesivir-watch closely-if he deteriorates-he has consented to the use of Actemra or baricitinib.  Note-rationale/risk/benefits of Actemra/baricitinib discussed-he consents to the use of these agents if his clinical condition worsens with severe hypoxemia.  He has no history of TB, hepatitis B or diverticulitis.  Fever: afebrile O2 requirements:  SpO2: 95 %  COVID-19 Labs: Recent Labs    02/07/20 1859 02/08/20 1200  DDIMER 2.45* 1.87*  CRP  --  5.6*    No results found for: BNP  Recent Labs  Lab 02/07/20 2338  PROCALCITON 0.21    Lab Results  Component Value Date   SARSCOV2NAA POSITIVE (A) 02/07/2020   SARSCOV2NAA NEGATIVE 02/03/2020   [place] NEGATIVE 12/20/2019   [place] NEGATIVE 12/14/2019     Prone/Incentive Spirometry: encouraged  incentive spirometry use 3-4/hour.  Leukopenia: Secondary to COVID-19-watch closely-repeat CBC in a.m.  COPD: No evidence of flare-continue bronchodilators  Chronic diastolic heart failure: Euvolemic-follow volume status closely.  History of prior CVA/carotid endarterectomy: Continue Plavix.  Stage IIIb adenocarcinoma of the lung: Claims recently completed RTX at [facility] 6 weeks back-before that he was on chemo at the [facility] hospital.  CT chest continues to show right lower lobe mass.  Follow with oncology post discharge.  Schizophrenia: Appears stable-continue thiothixene   ABG: No results found for: PHART, PCO2ART, PO2ART, HCO3, TCO2, ACIDBASEDEF, O2SAT  Vent Settings: N/A    Condition -Guarded  Family Communication  : Left voicemail for  spouse-Melvin Foley-[phone]-on 10/23  Code Status :  DNR  Diet :  Diet Order            Diet Heart Room service appropriate? Yes; Fluid consistency: Thin  Diet effective now                  Disposition Plan  :   Status is: Inpatient  Remains inpatient appropriate because:Inpatient level of care appropriate due to severity of illness   Dispo: The patient is from: Home              Anticipated d/c is to: Home              Anticipated d/c date is: > 3 days              Patient currently is not medically stable to d/c.   Barriers to discharge: Hypoxia requiring O2 supplementation/complete 5 days of IV Remdesivir  Antimicorbials  :    Anti-infectives (From admission, onward)   Start     Dose/Rate Route Frequency Ordered Stop   02/08/20 1000  remdesivir 100 mg in sodium chloride 0.9 % 100 mL IVPB  Status:  Discontinued       "Followed by" Linked Group Details   100 mg 200 mL/hr over 30 Minutes Intravenous Daily 02/07/20 2232 02/07/20 2252   02/08/20 1000  remdesivir 100 mg in sodium chloride 0.9 % 100 mL IVPB       "Followed by" Linked Group Details   100 mg 200 mL/hr over 30 Minutes Intravenous Daily 02/07/20 2252 02/12/20 0959   02/07/20 2330  remdesivir 100 mg in sodium chloride 0.9 % 100 mL IVPB       "Followed by" Linked Group Details   100 mg 200 mL/hr over 30 Minutes Intravenous  Once 02/07/20 2252 02/08/20 0107   02/07/20 2300  remdesivir 100 mg in sodium chloride 0.9 % 100 mL IVPB       "Followed by" Linked Group Details   100 mg 200 mL/hr over 30 Minutes Intravenous  Once 02/07/20 2252 02/08/20 0015   02/07/20 2245  remdesivir 200 mg in sodium chloride 0.9% 250 mL IVPB  Status:  Discontinued       "Followed by" Linked Group Details   200 mg 580 mL/hr over 30 Minutes Intravenous Once 02/07/20 2232 02/07/20 2252  Inpatient Medications  Scheduled Meds: . vitamin C  500 mg Oral Daily  . clopidogrel  75 mg Oral QHS  . enoxaparin (LOVENOX) injection  40 mg  Subcutaneous Daily  . fluticasone  2 spray Each Nare Daily  . guaiFENesin  600 mg Oral BID  . methylPREDNISolone (SOLU-MEDROL) injection  1 mg/kg Intravenous Q12H   Followed by  . [START ON 02/11/2020] predniSONE  50 mg Oral Daily  . pantoprazole  40 mg Oral BID  . thiothixene  2 mg Oral TID  . zinc sulfate  220 mg Oral Daily   Continuous Infusions: . sodium chloride 1,000 mL (02/08/20 0036)  . lactated ringers 100 mL/hr at 02/08/20 0756  . remdesivir 100 mg in NS 100 mL 100 mg (02/08/20 0811)   PRN Meds:.sodium chloride, acetaminophen, albuterol, guaiFENesin-dextromethorphan, HYDROcodone-acetaminophen, ondansetron **OR** ondansetron (ZOFRAN) IV, polyethylene glycol   Time Spent in minutes  25  See all Orders from today for further details   Oren Binet M.D on 02/08/2020 at 1:18 PM  To page go to [email or web address] - use universal password  [facility] -  Office  [phone]    Objective:   Vitals:   02/08/20 0315 02/08/20 0410 02/08/20 0505 02/08/20 1217  BP: 129/82  101/72 103/76  Pulse: 81  76 73  Resp: 18  20 18   Temp:  98.6 F (37 C) 98 F (36.7 C) (!) 97.4 F (36.3 C)  TempSrc:  Oral Oral Oral  SpO2: 94%  93% 95%  Weight:   67.1 kg   Height:   5\' 8"  (1.727 m)     Wt Readings from Last 3 Encounters:  02/08/20 67.1 kg  02/03/20 69.7 kg  12/20/19 68.9 kg     Intake/Output Summary (Last 24 hours) at 02/08/2020 1318 Last data filed at 02/08/2020 1113 Gross per 24 hour  Intake 200 ml  Output 250 ml  Net -50 ml     Physical Exam Gen Exam:Alert awake-not in any distress HEENT:atraumatic, normocephalic Chest: B/L clear to auscultation anteriorly CVS:S1S2 regular Abdomen:soft non tender, non distended Extremities:no edema Neurology: Non focal Skin: no rash   Data Review:    CBC Recent Labs  Lab 02/03/20 1712 02/07/20 1859 02/08/20 1200  WBC 4.6 4.1 1.2*  HGB 11.5* 12.3* 11.6*  HCT 35.1* 36.3* 34.9*  PLT 169 165 159  MCV 98.6 98.1  97.5  MCH 32.3 33.2 32.4  MCHC 32.8 33.9 33.2  RDW 14.5 14.1 13.8  LYMPHSABS 0.6* 0.5* 0.3*  MONOABS 1.0 0.5 0.1  EOSABS 0.2 0.2 0.0  BASOSABS 0.0 0.0 0.0    Chemistries  Recent Labs  Lab 02/03/20 1712 02/07/20 1859 02/08/20 1200  NA 127* 135 136  K 3.6 4.0 4.0  CL 94* 103 105  CO2 24 21* 21*  GLUCOSE 110* 108* 152*  BUN 22 26* 17  CREATININE 1.14 1.10 0.80  CALCIUM 8.3* 8.4* 8.4*  MG  --   --  1.5*  AST  --  32 29  ALT  --  16 16  ALKPHOS  --  66 61  BILITOT  --  0.6 0.7   ------------------------------------------------------------------------------------------------------------------ No results for input(s): CHOL, HDL, LDLCALC, TRIG, CHOLHDL, LDLDIRECT in the last 72 hours.  Lab Results  Component Value Date   HGBA1C 5.1 05/01/2017   ------------------------------------------------------------------------------------------------------------------ No results for input(s): TSH, T4TOTAL, T3FREE, THYROIDAB in the last 72 hours.  Invalid input(s): FREET3 ------------------------------------------------------------------------------------------------------------------ No results for input(s): VITAMINB12, FOLATE, FERRITIN, TIBC, IRON, RETICCTPCT in the last 72 hours.  Coagulation profile No results for input(s): INR, PROTIME in the last 168 hours.  Recent Labs    02/07/20 1859 02/08/20 1200  DDIMER 2.45* 1.87*    Cardiac Enzymes No results for input(s): CKMB, TROPONINI, MYOGLOBIN in the last 168 hours.  Invalid input(s): CK ------------------------------------------------------------------------------------------------------------------ No results found for: BNP  Micro Results Recent Results (from the past 240 hour(s))  Respiratory Panel by RT PCR (Flu A&B, Covid) - Nasopharyngeal Swab     Status: None   Collection Time: 02/03/20  6:08 PM   Specimen: Nasopharyngeal Swab  Result Value Ref Range Status   SARS Coronavirus 2 by RT PCR NEGATIVE NEGATIVE Final     Comment: (NOTE) SARS-CoV-2 target nucleic acids are NOT DETECTED.  The SARS-CoV-2 RNA is generally detectable in upper respiratoy specimens during the acute phase of infection. The lowest concentration of SARS-CoV-2 viral copies this assay can detect is 131 copies/mL. A negative result does not preclude SARS-Cov-2 infection and should not be used as the sole basis for treatment or other patient management decisions. A negative result may occur with  improper specimen collection/handling, submission of specimen other than nasopharyngeal swab, presence of viral mutation(s) within the areas targeted by this assay, and inadequate number of viral copies (<131 copies/mL). A negative result must be combined with clinical observations, patient history, and epidemiological information. The expected result is Negative.  Fact Sheet for Patients:  PinkCheek.be  Fact Sheet for Healthcare Providers:  GravelBags.it  This test is no t yet approved or cleared by the [place] FDA and  has been authorized for detection and/or diagnosis of SARS-CoV-2 by FDA under an Emergency Use Authorization (EUA). This EUA will remain  in effect (meaning this test can be used) for the duration of the COVID-19 declaration under Section 564(b)(1) of the Act, 21 U.S.C. section 360bbb-3(b)(1), unless the authorization is terminated or revoked sooner.     Influenza A by PCR NEGATIVE NEGATIVE Final   Influenza B by PCR NEGATIVE NEGATIVE Final    Comment: (NOTE) The Xpert Xpress SARS-CoV-2/FLU/RSV assay is intended as an aid in  the diagnosis of influenza from Nasopharyngeal swab specimens and  should not be used as a sole basis for treatment. Nasal washings and  aspirates are unacceptable for Xpert Xpress SARS-CoV-2/FLU/RSV  testing.  Fact Sheet for Patients: PinkCheek.be  Fact Sheet for Healthcare  Providers: GravelBags.it  This test is not yet approved or cleared by the [place] FDA and  has been authorized for detection and/or diagnosis of SARS-CoV-2 by  FDA under an Emergency Use Authorization (EUA). This EUA will remain  in effect (meaning this test can be used) for the duration of the  Covid-19 declaration under Section 564(b)(1) of the Act, 21  U.S.C. section 360bbb-3(b)(1), unless the authorization is  terminated or revoked. Performed at [facility], [address] [place], [place] [address]   Respiratory Panel by RT PCR (Flu A&B, Covid) - Nasopharyngeal Swab     Status: Abnormal   Collection Time: 02/07/20  6:59 PM   Specimen: Nasopharyngeal Swab  Result Value Ref Range Status   SARS Coronavirus 2 by RT PCR POSITIVE (A) NEGATIVE Final    Comment: RESULT CALLED TO, READ BACK BY AND VERIFIED WITH: NEAL KELLIE RN AT 2040 ON 02/07/20 BY I.[email or web address] (NOTE) SARS-CoV-2 target nucleic acids are DETECTED.  SARS-CoV-2 RNA is generally detectable in upper respiratory specimens  during the acute phase of infection. Positive results are indicative of the presence of the identified  virus, but do not rule out bacterial infection or co-infection with other pathogens not detected by the test. Clinical correlation with patient history and other diagnostic information is necessary to determine patient infection status. The expected result is Negative.  Fact Sheet for Patients:  PinkCheek.be  Fact Sheet for Healthcare Providers: GravelBags.it  This test is not yet approved or cleared by the [place] FDA and  has been authorized for detection and/or diagnosis of SARS-CoV-2 by FDA under an Emergency Use Authorization (EUA).  This EUA will remain in effect (meaning this t est can be used) for the duration of  the COVID-19 declaration under Section 564(b)(1) of the Act, 21 U.S.C.  section 360bbb-3(b)(1), unless the authorization is terminated or revoked sooner.      Influenza A by PCR NEGATIVE NEGATIVE Final   Influenza B by PCR NEGATIVE NEGATIVE Final    Comment: (NOTE) The Xpert Xpress SARS-CoV-2/FLU/RSV assay is intended as an aid in  the diagnosis of influenza from Nasopharyngeal swab specimens and  should not be used as a sole basis for treatment. Nasal washings and  aspirates are unacceptable for Xpert Xpress SARS-CoV-2/FLU/RSV  testing.  Fact Sheet for Patients: PinkCheek.be  Fact Sheet for Healthcare Providers: GravelBags.it  This test is not yet approved or cleared by the [place] FDA and  has been authorized for detection and/or diagnosis of SARS-CoV-2 by  FDA under an Emergency Use Authorization (EUA). This EUA will remain  in effect (meaning this test can be used) for the duration of the  Covid-19 declaration under Section 564(b)(1) of the Act, 21  U.S.C. section 360bbb-3(b)(1), unless the authorization is  terminated or revoked. Performed at [facility], [address] [place], [place] [address]   Culture, blood (routine x 2)     Status: None (Preliminary result)   Collection Time: 02/07/20  7:00 PM   Specimen: BLOOD  Result Value Ref Range Status   Specimen Description   Final    BLOOD RIGHT ANTECUBITAL Performed at [facility], [address] [place], [place] [address]    Special Requests   Final    BOTTLES DRAWN AEROBIC AND ANAEROBIC Blood Culture adequate volume Performed at [facility], [address] [place], [place] [address]    Culture   Final    NO GROWTH < 12 HOURS Performed at [facility], [place] [address], [place], [place] [address]    Report Status PENDING  Incomplete  Culture, blood (routine x 2)     Status: None (Preliminary result)   Collection Time: 02/07/20  7:00 PM   Specimen: BLOOD  Result Value Ref Range  Status   Specimen Description   Final    BLOOD BLOOD LEFT FOREARM Performed at [facility], [address] [place], [place] [address]    Special Requests   Final    BOTTLES DRAWN AEROBIC AND ANAEROBIC Blood Culture adequate volume Performed at [facility], [address] [place], [place] [address]    Culture   Final    NO GROWTH < 12 HOURS Performed at [facility], [place] [address], [place], [place] [address]    Report Status PENDING  Incomplete    Radiology Reports DG Chest 2 View  Result Date: 02/03/2020 CLINICAL DATA:  Hypotension, shortness of breath EXAM: CHEST - 2 VIEW COMPARISON:  12/20/2019 FINDINGS: Patchy airspace disease in the lower lobes concerning for pneumonia. Heart  is normal size. No effusions or pneumothorax. No acute bony abnormality. IMPRESSION: Patchy bilateral lower lobe airspace opacities concerning for pneumonia. Electronically Signed   By: Rolm Baptise M.D.   On: 02/03/2020 17:42   CT Angio Chest PE W and/or Wo Contrast  Result Date: 02/07/2020 CLINICAL DATA:  Positive D-dimer, shortness of breath, tachycardia. EXAM: CT ANGIOGRAPHY CHEST WITH CONTRAST TECHNIQUE: Multidetector CT imaging of the chest was performed using the standard protocol during bolus administration of intravenous contrast. Multiplanar CT image reconstructions and MIPs were obtained to evaluate the vascular anatomy. CONTRAST:  182mL OMNIPAQUE IOHEXOL 350 MG/ML SOLN COMPARISON:  12/20/2019 FINDINGS: Cardiovascular: No filling defects in the pulmonary arteries to suggest pulmonary emboli. Heart is normal size. Aortic and coronary artery calcifications. No aneurysm. Mediastinum/Nodes: Stable borderline sized right hilar lymph node. Scattered small and borderline sized mediastinal lymph nodes, stable. Trachea and esophagus are unremarkable. Thyroid unremarkable. Lungs/Pleura: Trace right pleural effusion. Masslike consolidation in the right lower lobe is again noted and  is stable since prior study. New rounded masslike area of consolidation in the right upper lobe measuring 2.5 cm. Increasing airspace disease posteriorly and peripherally in the right upper lobe and superior segment of the right lower lobe. Increasing ground-glass airspace opacity posteriorly in the superior segment of the left lower lobe. Paraseptal emphysema. Peripheral reticulation in the lungs bilaterally compatible with fibrosis. None none Upper Abdomen: Imaging into the upper abdomen demonstrates no acute findings. Musculoskeletal: Chest wall soft tissues are unremarkable. No acute bony abnormality. Review of the MIP images confirms the above findings. IMPRESSION: Rounded area of masslike consolidation in the right lower lobe with cavitation again noted, unchanged. New airspace disease seen in the right upper lobe and right lower lobe as well as superior segment of the left lower lobe. These areas are concerning for pneumonia. Areas of peripheral interstitial prominence and fibrosis bilaterally. Right hilar and borderline mediastinal lymph nodes are stable. Coronary artery disease. Aortic Atherosclerosis (ICD10-I70.0). Electronically Signed   By: Rolm Baptise M.D.   On: 02/07/2020 20:24   DG Chest Portable 1 View  Result Date: 02/07/2020 CLINICAL DATA:  Shortness of breath, cough, congestion EXAM: PORTABLE CHEST 1 VIEW COMPARISON:  02/03/2020 FINDINGS: Airspace disease in the right mid and lower lung again noted, most confluent at the right lung base compatible with pneumonia. Heart is normal size. Left lung clear. Previously seen patchy opacities at the left base have resolved. No effusions or acute bony abnormality. IMPRESSION: Continued patchy right mid and lower lung airspace opacities concerning for pneumonia. Clearing of the left basilar opacities since prior study. Electronically Signed   By: Rolm Baptise M.D.   On: 02/07/2020 18:51

## 2020-02-08 NOTE — Progress Notes (Signed)
Returned phone call to Jolene Provost, @ [phone], pts wife for an update. No answer, left VM for pt wife to call back

## 2020-02-08 NOTE — H&P (Signed)
History and Physical    Melvin Foley JME:[id number] DOB: [birth year] DOA: 02/07/2020  PCP: Wallie Char, FNP  Patient coming from: Home - [facility]   Chief Complaint:  Chief Complaint  Patient presents with  . Chest Pain  . Nasal Congestion  . 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  Patient explains that for approximately the past 3 to 4 days he has been experiencing progressively worsening shortness of breath.  Initially the shortness of breath was mild in intensity but progressively became more and more severe.  Shortness of breath is worse with exertion and improved with rest.  Shortness breath is associated with nonproductive cough and frequent bouts of fever.  Of note, patient presented to [place] emergency department on 10/18 (around the time his symptoms began) with complaints of low blood pressure.  At that time, patient was found to be COVID-19 negative.  Patient was felt to be suffering from a bacterial pneumonia and was discharged home on a regimen of doxycycline and Augmentin.  Because of patient's progressively worsening symptoms the patient eventually presented to [place] again.  Upon evaluation this time was found to be positive for COVID-19.  CT angiogram of the chest was performed due to a D-dimer of 2.45 which revealed patient's known right lower lobe mass but in addition to that patient had developed new right upper lobe, right lower lobe and left lower lobe consolidations.  Because of patient's known history of lung malignancy and concern for mild hypoxia emergency department provider felt it would be  best that the patient be hospitalized for his COVID-19 infection.  Patient initiated on intravenous remdesivir and steroid therapy in the hospitalist group was then called to assess the patient for admission to the hospital.  Review of Systems:   ROS  Past Medical History:  Diagnosis Date  . Anxiety    takes medication  . Arthritis   . Bipolar 1 disorder ([place])   . Bronchitis   . Cancer ([place])   . Carotid stenosis, left   . GERD (gastroesophageal reflux disease)   . H/O hiatal hernia   . Hypertension   . Shortness of breath   . Sleep apnea    2 YEARS  [place]   . Stroke [facility])     Past Surgical History:  Procedure Laterality Date  . BIOPSY  12/25/2019   Procedure: BIOPSY;  Surgeon: Rush Landmark Telford Nab., MD;  Location: [place];  Service: Gastroenterology;;  . CHOLECYSTECTOMY    . ENDARTERECTOMY Left 05/03/2017   Procedure: ENDARTERECTOMY CAROTID LEFT;  Surgeon: Rosetta Posner, MD;  Location: [facility] OR;  Service: Vascular;  Laterality: Left;  . ESOPHAGOGASTRODUODENOSCOPY (EGD) WITH PROPOFOL N/A 12/25/2019   Procedure: ESOPHAGOGASTRODUODENOSCOPY (EGD) WITH PROPOFOL;  Surgeon: Irving Copas., MD;  Location: [place];  Service: Gastroenterology;  Laterality: N/A;  . FRACTURE SURGERY     right ankle  . head injury    . KNEE SURGERY     x2  . ORIF SHOULDER FRACTURE  03/03/2011   Procedure: OPEN REDUCTION INTERNAL FIXATION (ORIF) SHOULDER FRACTURE;  Surgeon: Augustin Schooling;  Location: [place];  Service: Orthopedics;  Laterality:  Left;  LEFT PROXIMAL HUMERUS Open Reduction internal fixation  . SHOULDER ARTHROSCOPY WITH SUBACROMIAL DECOMPRESSION AND BICEP TENDON REPAIR  02/24/2012   Procedure: SHOULDER ARTHROSCOPY WITH SUBACROMIAL DECOMPRESSION AND BICEP TENDON REPAIR;  Surgeon: Augustin Schooling, MD;  Location: [place];  Service: Orthopedics;  Laterality: Left;  with capsular release and lysis of adhesions  . TONSILLECTOMY       reports that he has quit smoking. He smoked  1.50 packs per day. He has never used smokeless tobacco. He reports previous drug use. Drugs: Marijuana and LSD. He reports that he does not drink alcohol.  No Known Allergies  Family History  Problem Relation Age of Onset  . Congestive Heart Failure Mother   . Stroke Mother   . Congestive Heart Failure Father      Prior to Admission medications   Medication Sig Start Date End Date Taking? Authorizing Provider  acetaminophen (TYLENOL) 500 MG tablet Take 1,000 mg by mouth every 6 (six) hours as needed for mild pain.    [provider]  albuterol (VENTOLIN HFA) 108 (90 Base) MCG/ACT inhaler Inhale 1-2 puffs into the lungs every 6 (six) hours as needed for wheezing or shortness of breath.    [provider]  amoxicillin-clavulanate (AUGMENTIN) 875-125 MG tablet Take 1 tablet by mouth every 12 (twelve) hours for 7 days. 02/03/20 02/10/20  Lucrezia Starch, MD  clopidogrel (PLAVIX) 75 MG tablet Take 1 tablet (75 mg total) by mouth at bedtime. 05/01/17   Geradine Girt, DO  doxycycline (VIBRAMYCIN) 100 MG capsule Take 1 capsule (100 mg total) by mouth 2 (two) times daily for 7 days. 02/03/20 02/10/20  Lucrezia Starch, MD  fluticasone (FLONASE) 50 MCG/ACT nasal spray Place 2 sprays into both nostrils daily.  01/08/19   [provider]  guaiFENesin (MUCINEX) 600 MG 12 hr tablet Take 600 mg by mouth 2 (two) times daily.  08/06/19   [provider]  HYDROcodone-acetaminophen (NORCO) 10-325 MG tablet Take 1 tablet by mouth every 6 (six) hours as needed for moderate pain or severe pain. 12/27/19   Arrien, Jimmy Picket, MD  hydrocortisone cream 1 % Apply topically 2 (two) times daily. 12/17/19   Nita Sells, MD  pantoprazole sodium (PROTONIX) 40 mg/20 mL PACK Take 20 mLs (40 mg total) by mouth 2 (two) times daily. 12/27/19 01/26/20  Arrien, Jimmy Picket, MD  sildenafil (VIAGRA) 100 MG tablet Take 100 mg by mouth as needed for erectile dysfunction.   04/04/19   [provider]  sucralfate (CARAFATE) 1 GM/10ML suspension Take 10 mLs (1 g total) by mouth 4 (four) times daily -  with meals and at bedtime. 12/25/19 01/24/20  Arrien, Jimmy Picket, MD  thiothixene (NAVANE) 2 MG capsule Take 2 mg by mouth 3 (three) times daily.     [provider]    Physical Exam: Vitals:   02/08/20 0200 02/08/20 0315 02/08/20 0410 02/08/20 0505  BP: 120/80 129/82  101/72  Pulse: 80 81  76  Resp: 18 18  20   Temp:   98.6 F (37 C) 98 F (36.7 C)  TempSrc:   Oral Oral  SpO2: 96% 94%  93%  Weight:    67.1 kg  Height:    5\' 8"  (1.727 m)    Constitutional: Acute alert and oriented x3, no associated distress.   Skin: no rashes, no lesions, slightly poor skin turgor noted. Eyes: Pupils are equally reactive to light.  No evidence of scleral icterus or conjunctival pallor.  ENMT: Dry mucous membranes noted.  Posterior pharynx clear of any exudate or lesions.   Neck: normal, supple, no masses, no thyromegaly.  No evidence of jugular venous distension.   Respiratory: Scattered rhonchi bilaterally with bibasilar rales.  No evidence of wheezing. No accessory muscle use.  Cardiovascular: Regular rate and rhythm, no murmurs / rubs / gallops. No extremity edema. 2+ pedal pulses. No carotid bruits.  Chest:   Nontender without crepitus or deformity.   Back:   Nontender without crepitus or deformity. Abdomen: Abdomen is soft and nontender.  No evidence of intra-abdominal masses.  Positive bowel sounds noted in all quadrants.   Musculoskeletal: No joint deformity upper and lower extremities. Good ROM, no contractures. Normal muscle tone.  Neurologic: CN 2-12 grossly intact. Sensation intact.  Patient moving all 4 extremities spontaneously.  Patient is following all commands.  Patient is responsive to verbal stimuli.   Psychiatric: Patient exhibits normal mood with appropriate affect.  Patient seems to possess insight as to their current situation.      Labs on Admission: I have personally reviewed following labs and imaging studies -   CBC: Recent Labs  Lab 02/03/20 1712 02/07/20 1859  WBC 4.6 4.1  NEUTROABS 2.7 2.9  HGB 11.5* 12.3*  HCT 35.1* 36.3*  MCV 98.6 98.1  PLT 169 287   Basic Metabolic Panel: Recent Labs  Lab 02/03/20 1712 02/07/20 1859  NA 127* 135  K 3.6 4.0  CL 94* 103  CO2 24 21*  GLUCOSE 110* 108*  BUN 22 26*  CREATININE 1.14 1.10  CALCIUM 8.3* 8.4*   GFR: Estimated Creatinine Clearance: 55.9 mL/min (by C-G formula based on SCr of 1.1 mg/dL). Liver Function Tests: Recent Labs  Lab 02/07/20 1859  AST 32  ALT 16  ALKPHOS 66  BILITOT 0.6  PROT 7.2  ALBUMIN 3.2*   No results for input(s): LIPASE, AMYLASE in the last 168 hours. No results for input(s): AMMONIA in the last 168 hours. Coagulation Profile: No results for input(s): INR, PROTIME in the last 168 hours. Cardiac Enzymes: No results for input(s): CKTOTAL, CKMB, CKMBINDEX, TROPONINI in the last 168 hours. BNP (last 3 results) No results for input(s): PROBNP in the last 8760 hours. HbA1C: No results for input(s): HGBA1C in the last 72 hours. CBG: No results for input(s): GLUCAP in the last 168 hours. Lipid Profile: No results for input(s): CHOL, HDL, LDLCALC, TRIG, CHOLHDL, LDLDIRECT in the last 72 hours. Thyroid Function Tests: No results for input(s): TSH, T4TOTAL, FREET4, T3FREE, THYROIDAB in the last 72 hours. Anemia Panel: No results for input(s): VITAMINB12, FOLATE, FERRITIN, TIBC, IRON, RETICCTPCT in the last 72 hours. Urine analysis:    Component Value Date/Time   COLORURINE YELLOW 12/20/2019 1406   APPEARANCEUR CLEAR 12/20/2019 1406   LABSPEC 1.020 12/20/2019 1406   PHURINE 6.0 12/20/2019 1406   GLUCOSEU NEGATIVE 12/20/2019 1406   HGBUR NEGATIVE 12/20/2019 1406   BILIRUBINUR NEGATIVE 12/20/2019 1406   KETONESUR 15 (A) 12/20/2019 1406   PROTEINUR NEGATIVE 12/20/2019 1406   NITRITE NEGATIVE 12/20/2019 1406    LEUKOCYTESUR NEGATIVE 12/20/2019 1406    Radiological Exams on Admission - Personally Reviewed: CT Angio Chest PE W and/or Wo Contrast  Result Date: 02/07/2020 CLINICAL DATA:  Positive D-dimer, shortness of breath, tachycardia. EXAM: CT ANGIOGRAPHY CHEST WITH CONTRAST TECHNIQUE: Multidetector CT imaging of the chest was performed using the standard protocol during bolus administration of intravenous contrast. Multiplanar CT image reconstructions and MIPs were obtained to evaluate the vascular anatomy. CONTRAST:  157mL OMNIPAQUE IOHEXOL 350 MG/ML SOLN COMPARISON:  12/20/2019 FINDINGS: Cardiovascular: No filling defects in the pulmonary arteries to suggest pulmonary emboli. Heart is normal size. Aortic and coronary artery calcifications. No aneurysm. Mediastinum/Nodes: Stable borderline sized right hilar lymph node. Scattered small and borderline sized mediastinal lymph nodes, stable. Trachea and esophagus are unremarkable. Thyroid unremarkable. Lungs/Pleura: Trace right pleural effusion. Masslike consolidation in the right lower lobe is again noted and is stable since prior study. New rounded masslike area of consolidation in the right upper lobe measuring 2.5 cm. Increasing airspace disease posteriorly and peripherally in the right upper lobe and superior segment of the right lower lobe. Increasing ground-glass airspace opacity posteriorly in the superior segment of the left lower lobe. Paraseptal emphysema. Peripheral reticulation in the lungs bilaterally compatible with fibrosis. None none Upper Abdomen: Imaging into the upper abdomen demonstrates no acute findings. Musculoskeletal: Chest wall soft tissues are unremarkable. No acute bony abnormality. Review of the MIP images confirms the above findings. IMPRESSION: Rounded area of masslike consolidation in the right lower lobe with cavitation again noted, unchanged. New airspace disease seen in the right upper lobe and right lower lobe as well as superior  segment of the left lower lobe. These areas are concerning for pneumonia. Areas of peripheral interstitial prominence and fibrosis bilaterally. Right hilar and borderline mediastinal lymph nodes are stable. Coronary artery disease. Aortic Atherosclerosis (ICD10-I70.0). Electronically Signed   By: Rolm Baptise M.D.   On: 02/07/2020 20:24   DG Chest Portable 1 View  Result Date: 02/07/2020 CLINICAL DATA:  Shortness of breath, cough, congestion EXAM: PORTABLE CHEST 1 VIEW COMPARISON:  02/03/2020 FINDINGS: Airspace disease in the right mid and lower lung again noted, most confluent at the right lung base compatible with pneumonia. Heart is normal size. Left lung clear. Previously seen patchy opacities at the left base have resolved. No effusions or acute bony abnormality. IMPRESSION: Continued patchy right mid and lower lung airspace opacities concerning for pneumonia. Clearing of the left basilar opacities since prior study. Electronically Signed   By: Rolm Baptise M.D.   On: 02/07/2020 18:51     Assessment/Plan Active Problems:   Acute respiratory disease due to COVID-19 virus   Patient presenting with several day history of progressively worsening shortness of breath cough and recurrent fevers despite course of morning antibiotic therapy.  Patient exhibiting mild hypoxia upon evaluation in the emergency department and upon arrival to the COVID-19 unit  Considering patient's known history of lung malignancy, patient is at high risk of rapid clinical decompensation.  Patient was already initiated on intravenous remdesivir and steroid therapy at [facility] which will be continuing at this time  Procalcitonin essentially normal and therefore antibacterials is not necessary.  Providing patient with as needed bronchodilator therapy, as needed antitussives, zinc and vitamin C  Monitoring closely clinical and symptomatic improvement.    Radiation-induced esophagitis   Recent  diagnosis of radiation-induced esophagitis during hospitalization in September complicated by Barrett's esophagus and CMV esophagitis  Patient now clinically improved  Continuing Protonix twice daily    Schizophrenia ([place])   Continue home regimen of psychotropic medications    Chronic diastolic CHF (congestive heart failure) (HCC)   No clinical evidence of cardiogenic volume overload    COPD (chronic obstructive pulmonary disease) (H[facility])   No clinical evidence of COPD exacerbation at this time  .  Bronchodilator therapy for shortness of breath and wheezing    Primary adenocarcinoma of lower lobe of right lung ([place])  Right lung mass noted on CT of the chest consistent with patient's known history of lung malignancy  Outpatient follow-up  Code Status:  DNR Family Communication: deferred   Status is: Inpatient  Remains inpatient appropriate because:IV treatments appropriate due to intensity of illness or inability to take PO and Inpatient level of care appropriate due to severity of illness   Dispo: The patient is from: Home              Anticipated d/c is to: Home              Anticipated d/c date is: 3 days              Patient currently is not medically stable to d/c.        Vernelle Emerald MD [facility] Pager [phone]  If 7PM-7AM, please contact night-coverage [email or web address] Use universal [place] password for that web site. If you do not have the password, please call the hospital operator.  02/08/2020, 6:37 AM

## 2020-02-08 NOTE — Plan of Care (Signed)

## 2020-02-09 DIAGNOSIS — R0902 Hypoxemia: Secondary | ICD-10-CM

## 2020-02-09 DIAGNOSIS — U071 COVID-19: Secondary | ICD-10-CM | POA: Diagnosis not present

## 2020-02-09 LAB — MAGNESIUM: Magnesium: 1.7 mg/dL (ref 1.7–2.4)

## 2020-02-09 LAB — CBC WITH DIFFERENTIAL/PLATELET
Abs Immature Granulocytes: 0.01 10*3/uL (ref 0.00–0.07)
Basophils Absolute: 0 10*3/uL (ref 0.0–0.1)
Basophils Relative: 0 %
Eosinophils Absolute: 0 10*3/uL (ref 0.0–0.5)
Eosinophils Relative: 0 %
HCT: 35.3 % — ABNORMAL LOW (ref 39.0–52.0)
Hemoglobin: 12 g/dL — ABNORMAL LOW (ref 13.0–17.0)
Immature Granulocytes: 0 %
Lymphocytes Relative: 11 %
Lymphs Abs: 0.4 10*3/uL — ABNORMAL LOW (ref 0.7–4.0)
MCH: 33.1 pg (ref 26.0–34.0)
MCHC: 34 g/dL (ref 30.0–36.0)
MCV: 97.5 fL (ref 80.0–100.0)
Monocytes Absolute: 0.3 10*3/uL (ref 0.1–1.0)
Monocytes Relative: 8 %
Neutro Abs: 3 10*3/uL (ref 1.7–7.7)
Neutrophils Relative %: 81 %
Platelets: 178 10*3/uL (ref 150–400)
RBC: 3.62 MIL/uL — ABNORMAL LOW (ref 4.22–5.81)
RDW: 13.8 % (ref 11.5–15.5)
WBC: 3.6 10*3/uL — ABNORMAL LOW (ref 4.0–10.5)
nRBC: 0 % (ref 0.0–0.2)

## 2020-02-09 LAB — COMPREHENSIVE METABOLIC PANEL
ALT: 14 U/L (ref 0–44)
AST: 30 U/L (ref 15–41)
Albumin: 2.7 g/dL — ABNORMAL LOW (ref 3.5–5.0)
Alkaline Phosphatase: 59 U/L (ref 38–126)
Anion gap: 10 (ref 5–15)
BUN: 20 mg/dL (ref 8–23)
CO2: 22 mmol/L (ref 22–32)
Calcium: 8.7 mg/dL — ABNORMAL LOW (ref 8.9–10.3)
Chloride: 105 mmol/L (ref 98–111)
Creatinine, Ser: 0.87 mg/dL (ref 0.61–1.24)
GFR, Estimated: >60 mL/min (ref >60)
Glucose, Bld: 149 mg/dL — ABNORMAL HIGH (ref 70–99)
Potassium: 4.1 mmol/L (ref 3.5–5.1)
Sodium: 137 mmol/L (ref 135–145)
Total Bilirubin: 0.6 mg/dL (ref 0.3–1.2)
Total Protein: 6.6 g/dL (ref 6.5–8.1)

## 2020-02-09 LAB — C-REACTIVE PROTEIN: CRP: 3.7 mg/dL — ABNORMAL HIGH (ref <1.0)

## 2020-02-09 LAB — D-DIMER, QUANTITATIVE: D-Dimer, Quant: 1.86 ug/mL-FEU — ABNORMAL HIGH (ref 0.00–0.50)

## 2020-02-09 MED ORDER — ENOXAPARIN SODIUM 40 MG/0.4ML ~~LOC~~ SOLN
40.0000 mg | Freq: Two times a day (BID) | SUBCUTANEOUS | Status: DC
  Administered 2020-02-09 – 2020-02-10 (×2): 40 mg via SUBCUTANEOUS
  Filled 2020-02-09 (×2): qty 0.4

## 2020-02-09 NOTE — Progress Notes (Signed)
PROGRESS NOTE                                                                                                                                                                                                             Patient Demographics:    Melvin Foley, is a 74 y.o. male, DOB - Aug 31, 1945, PIR:[id number]  Outpatient Primary MD for the patient is Wallie Char, FNP   Admit date - 02/07/2020   LOS - 1  Chief Complaint  Patient presents with  . Chest Pain  . Nasal Congestion  . Shortness of Breath       Brief Narrative: Patient is a 74 y.o. male with PMHx of stage IIIb adenocarcinoma-finished radiation approximately 6 weeks back (follows at [facility])-wife with recent Covid 19 infection-presented with cough, fever, exertional dyspnea for 3-4 days.  Found to have COVID-19 pneumonia and admitted to the hospitalist service.  COVID-19 vaccinated status: Unvaccinated (counseled-still skeptical about the need of vaccination-claims ENTIRE family not vaccinated!)  Significant Events: 10/18>> evaluated at [facility] for hypotension/possible pneumonia-refused admission-Covid 19 negative 10/22>> presented to [facility] with fever/cough/exertional dyspnea-COVID-19 positive-pneumonia on CT chest.    Significant studies: 10/22>>Chest x-ray: Patchy right mid/lower lung opacities concerning for pneumonia 10/22>> CTA chest: New airspace disease in the right upper/right lower lobe/left lower lobe-rounded area of masslike consolidation in the right lower lobe with cavitation again noted. Leg ultrasound.  COVID-19 medications: Steroids: 10/22>> Remdesivir: 10/22>>  Antibiotics: None  Microbiology data: 10/22 >>blood culture: No growth  Procedures: None  Consults: None  DVT prophylaxis: enoxaparin (LOVENOX) injection 40 mg Start: 02/08/20 1000    Subjective:   Patient in bed, appears comfortable, denies any  headache, no fever, no chest pain or pressure, no shortness of breath , no abdominal pain. No focal weakness.   Assessment  & Plan :   Covid 19 Viral pneumonia: Appears stable-not hypoxic at rest-but does acknowledge exertional dyspnea-very early into COVID-19 illness-and at high risk to progress to severe disease given his medical comorbidities.  Continue steroid/Remdesivir-watch closely-if he deteriorates-he has consented to the use of Actemra or baricitinib.  Note-rationale/risk/benefits of Actemra/baricitinib discussed-he consents to the use of these agents if his clinical condition worsens with severe hypoxemia.  He has no history of TB, hepatitis B or diverticulitis.  Fever: afebrile O2 requirements:  SpO2: 91 %  Recent Labs  Lab 02/03/20 1712 02/03/20 1808 02/07/20 1757 02/07/20 1859 02/07/20 2338 02/08/20 1200 02/09/20 0349  WBC 4.6  --   --  4.1  --  1.2* 3.6*  HGB 11.5*  --   --  12.3*  --  11.6* 12.0*  HCT 35.1*  --   --  36.3*  --  34.9* 35.3*  PLT 169  --   --  165  --  159 178  CRP  --   --   --   --   --  5.6* 3.7*  DDIMER  --   --   --  2.45*  --  1.87* 1.86*  PROCALCITON  --   --   --   --  0.21  --   --   AST  --   --   --  32  --  29 30  ALT  --   --   --  16  --  16 14  ALKPHOS  --   --   --  66  --  61 59  BILITOT  --   --   --  0.6  --  0.7 0.6  ALBUMIN  --   --   --  3.2*  --  2.7* 2.7*  LATICACIDVEN 1.8  --  1.3  --   --   --   --   SARSCOV2NAA  --  NEGATIVE  --  POSITIVE*  --   --   --      '  Prone/Incentive Spirometry: encouraged  incentive spirometry use 3-4/hour.  Leukopenia: Secondary to COVID-19-watch closely-repeat CBC in a.m.  COPD: No evidence of flare-continue bronchodilators  Chronic diastolic heart failure: Euvolemic-follow volume status closely.  History of prior CVA/carotid endarterectomy: Continue Plavix.  Stage IIIb adenocarcinoma of the lung: Claims recently completed RTX at [facility] 6 weeks back-before that  he was on chemo at the [facility] hospital.  CT chest continues to show right lower lobe mass.  Follow with oncology post discharge.  Schizophrenia: Appears stable-continue thiothixene  High D-dimer due to inflammation.  CTA negative, check leg ultrasound and go up on Lovenox dose.      Condition -Guarded  Family Communication  : Previous MD left voicemail for spouse-Ruth-[phone]-on 10/23  Code Status :  DNR  Diet :  Diet Order            Diet Heart Room service appropriate? Yes; Fluid consistency: Thin  Diet effective now                  Disposition Plan  :   Status is: Inpatient  Remains inpatient appropriate because:Inpatient level of care appropriate due to severity of illness   Dispo: The patient is from: Home              Anticipated d/c is to: Home              Anticipated d/c date is: > 3 days              Patient currently is not medically stable to d/c.   Barriers to discharge: Hypoxia requiring O2 supplementation/complete 5 days of IV Remdesivir  Antimicorbials  :    Anti-infectives (From admission, onward)   Start     Dose/Rate Route Frequency Ordered Stop   02/08/20 1000  remdesivir 100 mg in sodium chloride 0.9 % 100 mL IVPB  Status:  Discontinued       "Followed by" Linked  Group Details   100 mg 200 mL/hr over 30 Minutes Intravenous Daily 02/07/20 2232 02/07/20 2252   02/08/20 1000  remdesivir 100 mg in sodium chloride 0.9 % 100 mL IVPB       "Followed by" Linked Group Details   100 mg 200 mL/hr over 30 Minutes Intravenous Daily 02/07/20 2252 02/12/20 0959   02/07/20 2330  remdesivir 100 mg in sodium chloride 0.9 % 100 mL IVPB       "Followed by" Linked Group Details   100 mg 200 mL/hr over 30 Minutes Intravenous  Once 02/07/20 2252 02/08/20 0107   02/07/20 2300  remdesivir 100 mg in sodium chloride 0.9 % 100 mL IVPB       "Followed by" Linked Group Details   100 mg 200 mL/hr over 30 Minutes Intravenous  Once 02/07/20 2252 02/08/20 0015    02/07/20 2245  remdesivir 200 mg in sodium chloride 0.9% 250 mL IVPB  Status:  Discontinued       "Followed by" Linked Group Details   200 mg 580 mL/hr over 30 Minutes Intravenous Once 02/07/20 2232 02/07/20 2252      Inpatient Medications  Scheduled Meds: . vitamin C  500 mg Oral Daily  . clopidogrel  75 mg Oral QHS  . enoxaparin (LOVENOX) injection  40 mg Subcutaneous Daily  . feeding supplement  237 mL Oral BID BM  . fluticasone  2 spray Each Nare Daily  . guaiFENesin  600 mg Oral BID  . methylPREDNISolone (SOLU-MEDROL) injection  1 mg/kg Intravenous Q12H   Followed by  . [START ON 02/11/2020] predniSONE  50 mg Oral Daily  . pantoprazole  40 mg Oral BID  . thiothixene  2 mg Oral TID  . zinc sulfate  220 mg Oral Daily   Continuous Infusions: . sodium chloride 1,000 mL (02/08/20 0036)  . remdesivir 100 mg in NS 100 mL 100 mg (02/09/20 1020)   PRN Meds:.sodium chloride, acetaminophen, albuterol, guaiFENesin-dextromethorphan, HYDROcodone-acetaminophen, ondansetron **OR** ondansetron (ZOFRAN) IV, polyethylene glycol   Time Spent in minutes  25  See all Orders from today for further details   Lala Lund M.D on 02/09/2020 at 12:06 PM  To page go to [email or web address] - use universal password  [facility] -  Office  [phone]    Objective:   Vitals:   02/08/20 1613 02/08/20 1639 02/08/20 1956 02/09/20 0514  BP:  101/69 92/74 117/64  Pulse:  76 82 75  Resp:  16 16 20   Temp:  98 F (36.7 C) 97.6 F (36.4 C) 98 F (36.7 C)  TempSrc:  Oral Oral Oral  SpO2: 98% 94% 91% 91%  Weight:      Height:        Wt Readings from Last 3 Encounters:  02/08/20 67.1 kg  02/03/20 69.7 kg  12/20/19 68.9 kg     Intake/Output Summary (Last 24 hours) at 02/09/2020 1206 Last data filed at 02/09/2020 1000 Gross per 24 hour  Intake 806.28 ml  Output 930 ml  Net -123.72 ml     Physical Exam  Awake Alert, No new F.N deficits, Normal affect [place].AT,PERRAL Supple  Neck,No JVD, No cervical lymphadenopathy appriciated.  Symmetrical Chest wall movement, Good air movement bilaterally, CTAB RRR,No Gallops, Rubs or new Murmurs, No Parasternal Heave +ve B.Sounds, Abd Soft, No tenderness, No organomegaly appriciated, No rebound - guarding or rigidity. No Cyanosis, Clubbing or edema, No new Rash or bruise    Data Review:    CBC Recent Labs  Lab 02/03/20  1712 02/07/20 1859 02/08/20 1200 02/09/20 0349  WBC 4.6 4.1 1.2* 3.6*  HGB 11.5* 12.3* 11.6* 12.0*  HCT 35.1* 36.3* 34.9* 35.3*  PLT 169 165 159 178  MCV 98.6 98.1 97.5 97.5  MCH 32.3 33.2 32.4 33.1  MCHC 32.8 33.9 33.2 34.0  RDW 14.5 14.1 13.8 13.8  LYMPHSABS 0.6* 0.5* 0.3* 0.4*  MONOABS 1.0 0.5 0.1 0.3  EOSABS 0.2 0.2 0.0 0.0  BASOSABS 0.0 0.0 0.0 0.0    Chemistries  Recent Labs  Lab 02/03/20 1712 02/07/20 1859 02/08/20 1200 02/09/20 0349  NA 127* 135 136 137  K 3.6 4.0 4.0 4.1  CL 94* 103 105 105  CO2 24 21* 21* 22  GLUCOSE 110* 108* 152* 149*  BUN 22 26* 17 20  CREATININE 1.14 1.10 0.80 0.87  CALCIUM 8.3* 8.4* 8.4* 8.7*  MG  --   --  1.5* 1.7  AST  --  32 29 30  ALT  --  16 16 14   ALKPHOS  --  66 61 59  BILITOT  --  0.6 0.7 0.6   ------------------------------------------------------------------------------------------------------------------ No results for input(s): CHOL, HDL, LDLCALC, TRIG, CHOLHDL, LDLDIRECT in the last 72 hours.  Lab Results  Component Value Date   HGBA1C 5.1 05/01/2017   ------------------------------------------------------------------------------------------------------------------ No results for input(s): TSH, T4TOTAL, T3FREE, THYROIDAB in the last 72 hours.  Invalid input(s): FREET3 ------------------------------------------------------------------------------------------------------------------ No results for input(s): VITAMINB12, FOLATE, FERRITIN, TIBC, IRON, RETICCTPCT in the last 72 hours.  Coagulation profile No results for input(s):  INR, PROTIME in the last 168 hours.  Recent Labs    02/08/20 1200 02/09/20 0349  DDIMER 1.87* 1.86*    Cardiac Enzymes No results for input(s): CKMB, TROPONINI, MYOGLOBIN in the last 168 hours.  Invalid input(s): CK ------------------------------------------------------------------------------------------------------------------ No results found for: BNP  Micro Results Recent Results (from the past 240 hour(s))  Respiratory Panel by RT PCR (Flu A&B, Covid) - Nasopharyngeal Swab     Status: None   Collection Time: 02/03/20  6:08 PM   Specimen: Nasopharyngeal Swab  Result Value Ref Range Status   SARS Coronavirus 2 by RT PCR NEGATIVE NEGATIVE Final    Comment: (NOTE) SARS-CoV-2 target nucleic acids are NOT DETECTED.  The SARS-CoV-2 RNA is generally detectable in upper respiratoy specimens during the acute phase of infection. The lowest concentration of SARS-CoV-2 viral copies this assay can detect is 131 copies/mL. A negative result does not preclude SARS-Cov-2 infection and should not be used as the sole basis for treatment or other patient management decisions. A negative result may occur with  improper specimen collection/handling, submission of specimen other than nasopharyngeal swab, presence of viral mutation(s) within the areas targeted by this assay, and inadequate number of viral copies (<131 copies/mL). A negative result must be combined with clinical observations, patient history, and epidemiological information. The expected result is Negative.  Fact Sheet for Patients:  PinkCheek.be  Fact Sheet for Healthcare Providers:  GravelBags.it  This test is no t yet approved or cleared by the [place] FDA and  has been authorized for detection and/or diagnosis of SARS-CoV-2 by FDA under an Emergency Use Authorization (EUA). This EUA will remain  in effect (meaning this test can be used) for the duration of  the COVID-19 declaration under Section 564(b)(1) of the Act, 21 U.S.C. section 360bbb-3(b)(1), unless the authorization is terminated or revoked sooner.     Influenza A by PCR NEGATIVE NEGATIVE Final   Influenza B by PCR NEGATIVE NEGATIVE Final    Comment: (  NOTE) The Xpert Xpress SARS-CoV-2/FLU/RSV assay is intended as an aid in  the diagnosis of influenza from Nasopharyngeal swab specimens and  should not be used as a sole basis for treatment. Nasal washings and  aspirates are unacceptable for Xpert Xpress SARS-CoV-2/FLU/RSV  testing.  Fact Sheet for Patients: PinkCheek.be  Fact Sheet for Healthcare Providers: GravelBags.it  This test is not yet approved or cleared by the [place] FDA and  has been authorized for detection and/or diagnosis of SARS-CoV-2 by  FDA under an Emergency Use Authorization (EUA). This EUA will remain  in effect (meaning this test can be used) for the duration of the  Covid-19 declaration under Section 564(b)(1) of the Act, 21  U.S.C. section 360bbb-3(b)(1), unless the authorization is  terminated or revoked. Performed at [facility], [address] [place], [place] [address]   Respiratory Panel by RT PCR (Flu A&B, Covid) - Nasopharyngeal Swab     Status: Abnormal   Collection Time: 02/07/20  6:59 PM   Specimen: Nasopharyngeal Swab  Result Value Ref Range Status   SARS Coronavirus 2 by RT PCR POSITIVE (A) NEGATIVE Final    Comment: RESULT CALLED TO, READ BACK BY AND VERIFIED WITH: NEAL KELLIE RN AT 2040 ON 02/07/20 BY I.SUGUT (NOTE) SARS-CoV-2 target nucleic acids are DETECTED.  SARS-CoV-2 RNA is generally detectable in upper respiratory specimens  during the acute phase of infection. Positive results are indicative of the presence of the identified virus, but do not rule out bacterial infection or co-infection with other pathogens not detected by the test. Clinical  correlation with patient history and other diagnostic information is necessary to determine patient infection status. The expected result is Negative.  Fact Sheet for Patients:  PinkCheek.be  Fact Sheet for Healthcare Providers: GravelBags.it  This test is not yet approved or cleared by the [place] FDA and  has been authorized for detection and/or diagnosis of SARS-CoV-2 by FDA under an Emergency Use Authorization (EUA).  This EUA will remain in effect (meaning this t est can be used) for the duration of  the COVID-19 declaration under Section 564(b)(1) of the Act, 21 U.S.C. section 360bbb-3(b)(1), unless the authorization is terminated or revoked sooner.      Influenza A by PCR NEGATIVE NEGATIVE Final   Influenza B by PCR NEGATIVE NEGATIVE Final    Comment: (NOTE) The Xpert Xpress SARS-CoV-2/FLU/RSV assay is intended as an aid in  the diagnosis of influenza from Nasopharyngeal swab specimens and  should not be used as a sole basis for treatment. Nasal washings and  aspirates are unacceptable for Xpert Xpress SARS-CoV-2/FLU/RSV  testing.  Fact Sheet for Patients: PinkCheek.be  Fact Sheet for Healthcare Providers: GravelBags.it  This test is not yet approved or cleared by the [place] FDA and  has been authorized for detection and/or diagnosis of SARS-CoV-2 by  FDA under an Emergency Use Authorization (EUA). This EUA will remain  in effect (meaning this test can be used) for the duration of the  Covid-19 declaration under Section 564(b)(1) of the Act, 21  U.S.C. section 360bbb-3(b)(1), unless the authorization is  terminated or revoked. Performed at [facility], [address] [place], [place] [address]   Culture, blood (routine x 2)     Status: None (Preliminary result)   Collection Time: 02/07/20  7:00 PM   Specimen: BLOOD  Result  Value Ref Range Status   Specimen Description   Final    BLOOD RIGHT ANTECUBITAL Performed  at [facility], [address] [place], [place] [address]    Special Requests   Final    BOTTLES DRAWN AEROBIC AND ANAEROBIC Blood Culture adequate volume Performed at [facility], [address] [place], [place] [address]    Culture   Final    NO GROWTH 1 DAY Performed at [facility], [place] [address], [place], [place] [address]    Report Status PENDING  Incomplete  Culture, blood (routine x 2)     Status: None (Preliminary result)   Collection Time: 02/07/20  7:00 PM   Specimen: BLOOD  Result Value Ref Range Status   Specimen Description   Final    BLOOD BLOOD LEFT FOREARM Performed at [facility], [address] [place], [place] [address]    Special Requests   Final    BOTTLES DRAWN AEROBIC AND ANAEROBIC Blood Culture adequate volume Performed at [facility], [address], [place], [place] [address]    Culture   Final    NO GROWTH 1 DAY Performed at [facility], [place] [address], [place], [place] [address]    Report Status PENDING  Incomplete    Radiology Reports DG Chest 2 View  Result Date: 02/03/2020 CLINICAL DATA:  Hypotension, shortness of breath EXAM: CHEST - 2 VIEW COMPARISON:  12/20/2019 FINDINGS: Patchy airspace disease in the lower lobes concerning for pneumonia. Heart is normal size. No effusions or pneumothorax. No acute bony abnormality. IMPRESSION: Patchy bilateral lower lobe airspace opacities concerning for pneumonia. Electronically Signed   By: Rolm Baptise M.D.   On: 02/03/2020 17:42   CT Angio Chest PE W and/or Wo Contrast  Result Date: 02/07/2020 CLINICAL DATA:  Positive D-dimer, shortness of breath, tachycardia. EXAM: CT ANGIOGRAPHY CHEST WITH CONTRAST TECHNIQUE: Multidetector CT imaging of the chest was performed using the standard protocol during bolus administration of intravenous contrast.  Multiplanar CT image reconstructions and MIPs were obtained to evaluate the vascular anatomy. CONTRAST:  117mL OMNIPAQUE IOHEXOL 350 MG/ML SOLN COMPARISON:  12/20/2019 FINDINGS: Cardiovascular: No filling defects in the pulmonary arteries to suggest pulmonary emboli. Heart is normal size. Aortic and coronary artery calcifications. No aneurysm. Mediastinum/Nodes: Stable borderline sized right hilar lymph node. Scattered small and borderline sized mediastinal lymph nodes, stable. Trachea and esophagus are unremarkable. Thyroid unremarkable. Lungs/Pleura: Trace right pleural effusion. Masslike consolidation in the right lower lobe is again noted and is stable since prior study. New rounded masslike area of consolidation in the right upper lobe measuring 2.5 cm. Increasing airspace disease posteriorly and peripherally in the right upper lobe and superior segment of the right lower lobe. Increasing ground-glass airspace opacity posteriorly in the superior segment of the left lower lobe. Paraseptal emphysema. Peripheral reticulation in the lungs bilaterally compatible with fibrosis. None none Upper Abdomen: Imaging into the upper abdomen demonstrates no acute findings. Musculoskeletal: Chest wall soft tissues are unremarkable. No acute bony abnormality. Review of the MIP images confirms the above findings. IMPRESSION: Rounded area of masslike consolidation in the right lower lobe with cavitation again noted, unchanged. New airspace disease seen in the right upper lobe and right lower lobe as well as superior segment of the left lower lobe. These areas are concerning for pneumonia. Areas of peripheral interstitial prominence and fibrosis bilaterally. Right hilar and borderline mediastinal lymph nodes are stable. Coronary artery disease. Aortic Atherosclerosis (ICD10-I70.0). Electronically Signed   By: Rolm Baptise M.D.   On: 02/07/2020  20:24   DG Chest Portable 1 View  Result Date: 02/07/2020 CLINICAL DATA:  Shortness  of breath, cough, congestion EXAM: PORTABLE CHEST 1 VIEW COMPARISON:  02/03/2020 FINDINGS: Airspace disease in the right mid and lower lung again noted, most confluent at the right lung base compatible with pneumonia. Heart is normal size. Left lung clear. Previously seen patchy opacities at the left base have resolved. No effusions or acute bony abnormality. IMPRESSION: Continued patchy right mid and lower lung airspace opacities concerning for pneumonia. Clearing of the left basilar opacities since prior study. Electronically Signed   By: Rolm Baptise M.D.   On: 02/07/2020 18:51

## 2020-02-09 NOTE — Plan of Care (Signed)
  Problem: Education: Goal: Knowledge of risk factors and measures for prevention of condition will improve Outcome: Progressing   Problem: Coping: Goal: Psychosocial and spiritual needs will be supported Outcome: Progressing   Problem: Respiratory: Goal: Will maintain a patent airway Outcome: Progressing Goal: Complications related to the disease process, condition or treatment will be avoided or minimized Outcome: Progressing   Problem: Health Behavior/Discharge Planning: Goal: Ability to manage health-related needs will improve Outcome: Progressing   Problem: Education: Goal: Knowledge of General Education information will improve Description: Including pain rating scale, medication(s)/side effects and non-pharmacologic comfort measures Outcome: Not Progressing

## 2020-02-09 NOTE — Progress Notes (Signed)
SATURATION QUALIFICATIONS: (This note is used to comply with regulatory documentation for home oxygen)  Patient Saturations on Room Air at Rest = 95%  Patient Saturations on Room Air while Ambulating = 88%  Patient Saturations on 1 Liters of oxygen while Ambulating = 91%  Please briefly explain why patient needs home oxygen:

## 2020-02-10 ENCOUNTER — Inpatient Hospital Stay (HOSPITAL_COMMUNITY): Payer: No Typology Code available for payment source

## 2020-02-10 DIAGNOSIS — U071 COVID-19: Secondary | ICD-10-CM | POA: Diagnosis not present

## 2020-02-10 DIAGNOSIS — R7989 Other specified abnormal findings of blood chemistry: Secondary | ICD-10-CM | POA: Diagnosis not present

## 2020-02-10 LAB — D-DIMER, QUANTITATIVE: D-Dimer, Quant: 1.67 ug/mL-FEU — ABNORMAL HIGH (ref 0.00–0.50)

## 2020-02-10 LAB — COMPREHENSIVE METABOLIC PANEL
ALT: 14 U/L (ref 0–44)
AST: 30 U/L (ref 15–41)
Albumin: 2.6 g/dL — ABNORMAL LOW (ref 3.5–5.0)
Alkaline Phosphatase: 64 U/L (ref 38–126)
Anion gap: 10 (ref 5–15)
BUN: 23 mg/dL (ref 8–23)
CO2: 22 mmol/L (ref 22–32)
Calcium: 8.5 mg/dL — ABNORMAL LOW (ref 8.9–10.3)
Chloride: 104 mmol/L (ref 98–111)
Creatinine, Ser: 1.03 mg/dL (ref 0.61–1.24)
GFR, Estimated: >60 mL/min (ref >60)
Glucose, Bld: 165 mg/dL — ABNORMAL HIGH (ref 70–99)
Potassium: 3.9 mmol/L (ref 3.5–5.1)
Sodium: 136 mmol/L (ref 135–145)
Total Bilirubin: 0.6 mg/dL (ref 0.3–1.2)
Total Protein: 6.3 g/dL — ABNORMAL LOW (ref 6.5–8.1)

## 2020-02-10 LAB — CBC WITH DIFFERENTIAL/PLATELET
Abs Immature Granulocytes: 0.07 10*3/uL (ref 0.00–0.07)
Basophils Absolute: 0 10*3/uL (ref 0.0–0.1)
Basophils Relative: 0 %
Eosinophils Absolute: 0 10*3/uL (ref 0.0–0.5)
Eosinophils Relative: 0 %
HCT: 35 % — ABNORMAL LOW (ref 39.0–52.0)
Hemoglobin: 11.7 g/dL — ABNORMAL LOW (ref 13.0–17.0)
Immature Granulocytes: 1 %
Lymphocytes Relative: 4 %
Lymphs Abs: 0.3 10*3/uL — ABNORMAL LOW (ref 0.7–4.0)
MCH: 32.8 pg (ref 26.0–34.0)
MCHC: 33.4 g/dL (ref 30.0–36.0)
MCV: 98 fL (ref 80.0–100.0)
Monocytes Absolute: 0.4 10*3/uL (ref 0.1–1.0)
Monocytes Relative: 5 %
Neutro Abs: 7.2 10*3/uL (ref 1.7–7.7)
Neutrophils Relative %: 90 %
Platelets: 198 10*3/uL (ref 150–400)
RBC: 3.57 MIL/uL — ABNORMAL LOW (ref 4.22–5.81)
RDW: 13.8 % (ref 11.5–15.5)
WBC: 8 10*3/uL (ref 4.0–10.5)
nRBC: 0 % (ref 0.0–0.2)

## 2020-02-10 LAB — C-REACTIVE PROTEIN: CRP: 1.8 mg/dL — ABNORMAL HIGH (ref <1.0)

## 2020-02-10 LAB — MAGNESIUM: Magnesium: 1.8 mg/dL (ref 1.7–2.4)

## 2020-02-10 MED ORDER — LACTATED RINGERS IV SOLN: INTRAVENOUS | Status: AC

## 2020-02-10 MED ORDER — METHYLPREDNISOLONE 4 MG PO TBPK: ORAL_TABLET | ORAL | 0 refills | Status: DC

## 2020-02-10 MED ORDER — ALBUTEROL SULFATE HFA 108 (90 BASE) MCG/ACT IN AERS
1.0000 | INHALATION_SPRAY | Freq: Four times a day (QID) | RESPIRATORY_TRACT | 0 refills | Status: AC | PRN
Start: 2020-02-10 — End: ?

## 2020-02-10 MED ORDER — ROSUVASTATIN CALCIUM 20 MG PO TABS
40.0000 mg | ORAL_TABLET | Freq: Every day | ORAL | Status: DC
  Administered 2020-02-10: 40 mg via ORAL
  Filled 2020-02-10 (×2): qty 2

## 2020-02-10 MED ORDER — GEMFIBROZIL 600 MG PO TABS
600.0000 mg | ORAL_TABLET | Freq: Two times a day (BID) | ORAL | Status: DC
  Filled 2020-02-10 (×2): qty 1

## 2020-02-10 MED ORDER — VITAMIN D3 25 MCG (1000 UNIT) PO TABS
2000.0000 [IU] | ORAL_TABLET | Freq: Every day | ORAL | Status: DC
  Administered 2020-02-10: 2000 [IU] via ORAL
  Filled 2020-02-10 (×2): qty 2

## 2020-02-10 NOTE — Progress Notes (Signed)
Bilateral lower extremity venous duplex has been completed. Preliminary results can be found in CV Proc through chart review.   02/10/20 4:34 PM Melvin Foley RVT

## 2020-02-10 NOTE — Progress Notes (Signed)
The patient is scheduled for a Remdesivir infusion on 10/26 at 1030AM. Have the patient come to [place], they will see a COVID infusion banner by the road.  Enter there and turn left. There are marked spaces for Infusion.  Call the number on the sign or [phone] and someone will come out and bring them inside.  If someone is driving them, have them come to the same area and call the number and someone will come outside to get them. Thank you!

## 2020-02-10 NOTE — Discharge Summary (Signed)
Melvin Foley FBP:[id number] DOB: 01/26/1946 DOA: 02/07/2020  PCP: Wallie Char, FNP  Admit date: 02/07/2020  Discharge date: 02/10/2020  Admitted From: Home   Disposition:  Home   Recommendations for Outpatient Follow-up:   Follow up with PCP in 1-2 weeks  PCP Please obtain BMP/CBC, 2 view CXR in 1week,  (see Discharge instructions)   PCP Please follow up on the following pending results: CBC, CMP, 2 view chest x-ray in 1 week.  Review CT scan results.   Home Health: None   Equipment/Devices: 2lit o2  Consultations: None  Discharge Condition: Stable    CODE STATUS: Full    Diet Recommendation: Heart Healthy   Diet Order            Diet - low sodium heart healthy           Diet Heart Room service appropriate? Yes; Fluid consistency: Thin  Diet effective now                  Chief Complaint  Patient presents with  . Chest Pain  . Nasal Congestion  . Shortness of Breath     Brief history of present illness from the day of admission and additional interim summary    Patient is a 74 y.o. male with PMHx of stage IIIb adenocarcinoma-finished radiation approximately 6 weeks back (follows at [facility])-wife with recent Covid 19 infection-presented with cough, fever, exertional dyspnea for 3-4 days.  Found to have COVID-19 pneumonia and admitted to the hospitalist service.  COVID-19 vaccinated status: Unvaccinated (counseled-still skeptical about the need of vaccination-claims ENTIRE family not vaccinated!)  Significant Events: 10/18>> evaluated at [facility] for hypotension/possible pneumonia-refused admission-Covid 19 negative 10/22>> presented to [facility] with fever/cough/exertional dyspnea-COVID-19 positive-pneumonia on CT chest.    Significant studies: 10/22>>Chest x-ray:  Patchy right mid/lower lung opacities concerning for pneumonia 10/22>> CTA chest: New airspace disease in the right upper/right lower lobe/left lower lobe-rounded area of masslike consolidation in the right lower lobe with cavitation again noted. Leg ultrasound.  Pending.  Will be done prior to discharge if positive this will be updated.  COVID-19 medications: Steroids: 10/22>> Remdesivir: 10/22>>                                                                 Hospital Course   Covid 19 Viral pneumonia: Appears stable-not hypoxic at rest-but does acknowledge exertional dyspnea-he had mild parenchymal lung injury and was treated with steroids and Remdesivir, remains asymptomatic at rest, will be ambulated and if qualifies will get as needed home oxygen, he will also get steroid taper and a rescue inhaler upon discharge.  Must follow with PCP within a week.  Dmitriy markers are coming down and clinically remained stable.  SpO2: 95 %  Recent Labs  Lab 02/03/20 1712 02/03/20 1808 02/07/20 1757 02/07/20 1859 02/07/20 2338 02/08/20 1200 02/09/20 0349 02/10/20 0105  WBC 4.6  --   --  4.1  --  1.2* 3.6* 8.0  CRP  --   --   --   --   --  5.6* 3.7* 1.8*  DDIMER  --   --   --  2.45*  --  1.87* 1.86* 1.67*  PROCALCITON  --   --   --   --  0.21  --   --   --   LATICACIDVEN 1.8  --  1.3  --   --   --   --   --   AST  --   --   --  32  --  29 30 30   ALT  --   --   --  16  --  16 14 14   ALKPHOS  --   --   --  66  --  61 59 64  BILITOT  --   --   --  0.6  --  0.7 0.6 0.6  ALBUMIN  --   --   --  3.2*  --  2.7* 2.7* 2.6*  SARSCOV2NAA  --  NEGATIVE  --  POSITIVE*  --   --   --   --        Leukopenia: Secondary to COVID-19, resolved.  COPD: No evidence of flare-continue supportive care as before.  No wheezing.  Chronic diastolic heart failure: Euvolemic.  History of prior CVA/carotid endarterectomy: Continue Plavix & statin.  Stage IIIb adenocarcinoma of the lung: Claims recently  completed RTX at [facility] 6 weeks back-before that he was on chemo at the [facility] hospital.  CT chest continues to show right lower lobe mass.  Follow with oncology post discharge.  Schizophrenia: Appears stable-continue thiothixene  High D-dimer due to inflammation.    CTA negative for PE, leg ultrasound will be checked prior to discharge, if positive he will be placed on anticoagulation but clinical suspicion is extremely low.  If positive will update the discharge summary.    Discharge diagnosis     Active Problems:   Radiation-induced esophagitis   Primary adenocarcinoma of lower lobe of right lung (HCC)   Schizophrenia (HCC)   Acute respiratory disease due to COVID-19 virus   Chronic diastolic CHF (congestive heart failure) (HCC)   COPD (chronic obstructive pulmonary disease) ([place])    Discharge instructions    Discharge Instructions    Diet - low sodium heart healthy   Complete by: As directed    Discharge instructions   Complete by: As directed    Review your chest CT results with your primary care physician, also follow with your pulmonologist and oncologist within a 1 week and review CT results with them as well.  Follow with Primary MD Wallie Char, FNP in 7 days   Get CBC, CMP, 2 view Chest X ray -  checked next visit within 1 week by Primary MD    Activity: As tolerated with Full fall precautions use walker/cane & assistance as needed  Disposition Home   Diet: Heart Healthy    Special Instructions: If you have smoked or chewed Tobacco  in the last 2 yrs please stop smoking, stop any regular Alcohol  and or any Recreational drug use.  On your next visit with your primary care physician please Get Medicines reviewed and adjusted.  Please request your Prim.MD to go over all [facility]  Tests and Procedure/Radiological results at the follow up, please get all Hospital records sent to your Prim MD by signing hospital release before you go home.  If you  experience worsening of your admission symptoms, develop shortness of breath, life threatening emergency, suicidal or homicidal thoughts you must seek medical attention immediately by calling 911 or calling your MD immediately  if symptoms less severe.  You Must read complete instructions/literature along with all the possible adverse reactions/side effects for all the Medicines you take and that have been prescribed to you. Take any new Medicines after you have completely understood and accpet all the possible adverse reactions/side effects.   Increase activity slowly   Complete by: As directed       Discharge Medications   Allergies as of 02/10/2020      Reactions   Statins Rash, Other (See Comments)   Bone pain, Myalgias      Medication List    STOP taking these medications   amoxicillin-clavulanate 875-125 MG tablet Commonly known as: AUGMENTIN   doxycycline 100 MG capsule Commonly known as: VIBRAMYCIN   HYDROcodone-acetaminophen 10-325 MG tablet Commonly known as: NORCO   hydrocortisone cream 1 %   pantoprazole sodium 40 mg/20 mL Pack Commonly known as: PROTONIX   sucralfate 1 GM/10ML suspension Commonly known as: CARAFATE     TAKE these medications   albuterol 108 (90 Base) MCG/ACT inhaler Commonly known as: VENTOLIN HFA Inhale 1-2 puffs into the lungs every 6 (six) hours as needed for wheezing or shortness of breath.   clopidogrel 75 MG tablet Commonly known as: PLAVIX Take 1 tablet (75 mg total) by mouth at bedtime.   gemfibrozil 600 MG tablet Commonly known as: LOPID Take 600 mg by mouth 2 (two) times daily before a meal.   guaiFENesin 600 MG 12 hr tablet Commonly known as: MUCINEX Take 600 mg by mouth 2 (two) times daily.   methylPREDNISolone 4 MG Tbpk tablet Commonly known as: MEDROL DOSEPAK follow package directions   omeprazole 40 MG capsule Commonly known as: PRILOSEC Take 40 mg by mouth daily.   ondansetron 8 MG tablet Commonly known as:  ZOFRAN Take 8 mg by mouth 3 (three) times daily as needed for nausea or vomiting.   rosuvastatin 40 MG tablet Commonly known as: CRESTOR Take 40 mg by mouth daily.   thiothixene 5 MG capsule Commonly known as: NAVANE Take 15 mg by mouth at bedtime.   Vitamin D3 50 MCG (2000 UT) capsule Take 2,000 Units by mouth daily.            Durable Medical Equipment  (From admission, onward)         Start     Ordered   02/10/20 0945  For home use only DME oxygen  Once       Question Answer Comment  Length of Need 6 Months   Mode or (Route) Nasal cannula   Liters per Minute 2   Frequency Continuous (stationary and portable oxygen unit needed)   Oxygen conserving device Yes   Oxygen delivery system Gas      02/10/20 0945           Follow-up Information    Wallie Char, FNP. Schedule an appointment as soon as possible for a visit in 1 week(s).   Specialty: Family Medicine Why: Also follow with your pulmonologist oncologist within a week, review CT results with them. Contact information: [place] [address] [phone]  Major procedures and Radiology Reports - PLEASE review detailed and final reports thoroughly  -       DG Chest 2 View  Result Date: 02/03/2020 CLINICAL DATA:  Hypotension, shortness of breath EXAM: CHEST - 2 VIEW COMPARISON:  12/20/2019 FINDINGS: Patchy airspace disease in the lower lobes concerning for pneumonia. Heart is normal size. No effusions or pneumothorax. No acute bony abnormality. IMPRESSION: Patchy bilateral lower lobe airspace opacities concerning for pneumonia. Electronically Signed   By: Rolm Baptise M.D.   On: 02/03/2020 17:42   CT Angio Chest PE W and/or Wo Contrast  Result Date: 02/07/2020 CLINICAL DATA:  Positive D-dimer, shortness of breath, tachycardia. EXAM: CT ANGIOGRAPHY CHEST WITH CONTRAST TECHNIQUE: Multidetector CT imaging of the chest was performed using the standard  protocol during bolus administration of intravenous contrast. Multiplanar CT image reconstructions and MIPs were obtained to evaluate the vascular anatomy. CONTRAST:  142mL OMNIPAQUE IOHEXOL 350 MG/ML SOLN COMPARISON:  12/20/2019 FINDINGS: Cardiovascular: No filling defects in the pulmonary arteries to suggest pulmonary emboli. Heart is normal size. Aortic and coronary artery calcifications. No aneurysm. Mediastinum/Nodes: Stable borderline sized right hilar lymph node. Scattered small and borderline sized mediastinal lymph nodes, stable. Trachea and esophagus are unremarkable. Thyroid unremarkable. Lungs/Pleura: Trace right pleural effusion. Masslike consolidation in the right lower lobe is again noted and is stable since prior study. New rounded masslike area of consolidation in the right upper lobe measuring 2.5 cm. Increasing airspace disease posteriorly and peripherally in the right upper lobe and superior segment of the right lower lobe. Increasing ground-glass airspace opacity posteriorly in the superior segment of the left lower lobe. Paraseptal emphysema. Peripheral reticulation in the lungs bilaterally compatible with fibrosis. None none Upper Abdomen: Imaging into the upper abdomen demonstrates no acute findings. Musculoskeletal: Chest wall soft tissues are unremarkable. No acute bony abnormality. Review of the MIP images confirms the above findings. IMPRESSION: Rounded area of masslike consolidation in the right lower lobe with cavitation again noted, unchanged. New airspace disease seen in the right upper lobe and right lower lobe as well as superior segment of the left lower lobe. These areas are concerning for pneumonia. Areas of peripheral interstitial prominence and fibrosis bilaterally. Right hilar and borderline mediastinal lymph nodes are stable. Coronary artery disease. Aortic Atherosclerosis (ICD10-I70.0). Electronically Signed   By: Rolm Baptise M.D.   On: 02/07/2020 20:24   DG Chest  Portable 1 View  Result Date: 02/07/2020 CLINICAL DATA:  Shortness of breath, cough, congestion EXAM: PORTABLE CHEST 1 VIEW COMPARISON:  02/03/2020 FINDINGS: Airspace disease in the right mid and lower lung again noted, most confluent at the right lung base compatible with pneumonia. Heart is normal size. Left lung clear. Previously seen patchy opacities at the left base have resolved. No effusions or acute bony abnormality. IMPRESSION: Continued patchy right mid and lower lung airspace opacities concerning for pneumonia. Clearing of the left basilar opacities since prior study. Electronically Signed   By: Rolm Baptise M.D.   On: 02/07/2020 18:51    Micro Results     Recent Results (from the past 240 hour(s))  Respiratory Panel by RT PCR (Flu A&B, Covid) - Nasopharyngeal Swab     Status: None   Collection Time: 02/03/20  6:08 PM   Specimen: Nasopharyngeal Swab  Result Value Ref Range Status   SARS Coronavirus 2 by RT PCR NEGATIVE NEGATIVE Final    Comment: (NOTE) SARS-CoV-2 target nucleic acids are NOT DETECTED.  The SARS-CoV-2 RNA is generally detectable in upper  respiratoy specimens during the acute phase of infection. The lowest concentration of SARS-CoV-2 viral copies this assay can detect is 131 copies/mL. A negative result does not preclude SARS-Cov-2 infection and should not be used as the sole basis for treatment or other patient management decisions. A negative result may occur with  improper specimen collection/handling, submission of specimen other than nasopharyngeal swab, presence of viral mutation(s) within the areas targeted by this assay, and inadequate number of viral copies (<131 copies/mL). A negative result must be combined with clinical observations, patient history, and epidemiological information. The expected result is Negative.  Fact Sheet for Patients:  PinkCheek.be  Fact Sheet for Healthcare Providers:   GravelBags.it  This test is no t yet approved or cleared by the [place] FDA and  has been authorized for detection and/or diagnosis of SARS-CoV-2 by FDA under an Emergency Use Authorization (EUA). This EUA will remain  in effect (meaning this test can be used) for the duration of the COVID-19 declaration under Section 564(b)(1) of the Act, 21 U.S.C. section 360bbb-3(b)(1), unless the authorization is terminated or revoked sooner.     Influenza A by PCR NEGATIVE NEGATIVE Final   Influenza B by PCR NEGATIVE NEGATIVE Final    Comment: (NOTE) The Xpert Xpress SARS-CoV-2/FLU/RSV assay is intended as an aid in  the diagnosis of influenza from Nasopharyngeal swab specimens and  should not be used as a sole basis for treatment. Nasal washings and  aspirates are unacceptable for Xpert Xpress SARS-CoV-2/FLU/RSV  testing.  Fact Sheet for Patients: PinkCheek.be  Fact Sheet for Healthcare Providers: GravelBags.it  This test is not yet approved or cleared by the [place] FDA and  has been authorized for detection and/or diagnosis of SARS-CoV-2 by  FDA under an Emergency Use Authorization (EUA). This EUA will remain  in effect (meaning this test can be used) for the duration of the  Covid-19 declaration under Section 564(b)(1) of the Act, 21  U.S.C. section 360bbb-3(b)(1), unless the authorization is  terminated or revoked. Performed at [facility], [address] [place], [place] [address]   Respiratory Panel by RT PCR (Flu A&B, Covid) - Nasopharyngeal Swab     Status: Abnormal   Collection Time: 02/07/20  6:59 PM   Specimen: Nasopharyngeal Swab  Result Value Ref Range Status   SARS Coronavirus 2 by RT PCR POSITIVE (A) NEGATIVE Final    Comment: RESULT CALLED TO, READ BACK BY AND VERIFIED WITH: NEAL KELLIE RN AT 2040 ON 02/07/20 BY I.[email or web address] (NOTE) SARS-CoV-2 target nucleic acids  are DETECTED.  SARS-CoV-2 RNA is generally detectable in upper respiratory specimens  during the acute phase of infection. Positive results are indicative of the presence of the identified virus, but do not rule out bacterial infection or co-infection with other pathogens not detected by the test. Clinical correlation with patient history and other diagnostic information is necessary to determine patient infection status. The expected result is Negative.  Fact Sheet for Patients:  PinkCheek.be  Fact Sheet for Healthcare Providers: GravelBags.it  This test is not yet approved or cleared by the [place] FDA and  has been authorized for detection and/or diagnosis of SARS-CoV-2 by FDA under an Emergency Use Authorization (EUA).  This EUA will remain in effect (meaning this t est can be used) for the duration of  the COVID-19 declaration under Section 564(b)(1) of the Act, 21 U.S.C. section 360bbb-3(b)(1), unless the authorization is terminated or revoked sooner.      Influenza A  by PCR NEGATIVE NEGATIVE Final   Influenza B by PCR NEGATIVE NEGATIVE Final    Comment: (NOTE) The Xpert Xpress SARS-CoV-2/FLU/RSV assay is intended as an aid in  the diagnosis of influenza from Nasopharyngeal swab specimens and  should not be used as a sole basis for treatment. Nasal washings and  aspirates are unacceptable for Xpert Xpress SARS-CoV-2/FLU/RSV  testing.  Fact Sheet for Patients: PinkCheek.be  Fact Sheet for Healthcare Providers: GravelBags.it  This test is not yet approved or cleared by the [place] FDA and  has been authorized for detection and/or diagnosis of SARS-CoV-2 by  FDA under an Emergency Use Authorization (EUA). This EUA will remain  in effect (meaning this test can be used) for the duration of the  Covid-19 declaration under Section 564(b)(1) of the  Act, 21  U.S.C. section 360bbb-3(b)(1), unless the authorization is  terminated or revoked. Performed at [facility], [address] [place], [place] [address]   Culture, blood (routine x 2)     Status: None (Preliminary result)   Collection Time: 02/07/20  7:00 PM   Specimen: BLOOD  Result Value Ref Range Status   Specimen Description   Final    BLOOD RIGHT ANTECUBITAL Performed at [facility], [address] [place], [place] [address]    Special Requests   Final    BOTTLES DRAWN AEROBIC AND ANAEROBIC Blood Culture adequate volume Performed at [facility], [address], [place], [place] [address]    Culture   Final    NO GROWTH 1 DAY Performed at [facility], [place] [address], [place], [place] [address]    Report Status PENDING  Incomplete  Culture, blood (routine x 2)     Status: None (Preliminary result)   Collection Time: 02/07/20  7:00 PM   Specimen: BLOOD  Result Value Ref Range Status   Specimen Description   Final    BLOOD BLOOD LEFT FOREARM Performed at [facility], [address] [place], [place] [address]    Special Requests   Final    BOTTLES DRAWN AEROBIC AND ANAEROBIC Blood Culture adequate volume Performed at [facility], [address], [place], [place] [address]    Culture   Final    NO GROWTH 1 DAY Performed at [facility], [place] [address], [place], [place] [address]    Report Status PENDING  Incomplete    Today   Subjective    Aeneas Longsworth today has no headache,no chest abdominal pain,no new weakness tingling or numbness, feels much better wants to go home today.     Objective   Blood pressure 102/65, pulse 86, temperature 97.9 F (36.6 C), temperature source Oral, resp. rate 20, height 5\' 8"  (1.727 m), weight 67.1 kg, SpO2 95 %.   Intake/Output Summary (Last 24 hours) at 02/10/2020 1002 Last data filed at 02/10/2020 0900 Gross per 24 hour  Intake 360 ml   Output 600 ml  Net -240 ml    Exam  Awake Alert, No new F.N deficits, Normal affect .AT,PERRAL Supple Neck,No JVD, No cervical lymphadenopathy appriciated.  Symmetrical Chest wall movement, Good air movement bilaterally, CTAB RRR,No Gallops,Rubs or new Murmurs, No Parasternal Heave +ve B.Sounds, Abd Soft, Non tender, No organomegaly appriciated, No rebound -guarding or rigidity. No Cyanosis, Clubbing or edema, No new Rash or bruise   Data Review   CBC w Diff:  Lab Results  Component  Value Date   WBC 8.0 02/10/2020   HGB 11.7 (L) 02/10/2020   HCT 35.0 (L) 02/10/2020   PLT 198 02/10/2020   LYMPHOPCT 4 02/10/2020   MONOPCT 5 02/10/2020   EOSPCT 0 02/10/2020   BASOPCT 0 02/10/2020    CMP:  Lab Results  Component Value Date   NA 136 02/10/2020   K 3.9 02/10/2020   CL 104 02/10/2020   CO2 22 02/10/2020   BUN 23 02/10/2020   CREATININE 1.03 02/10/2020   PROT 6.3 (L) 02/10/2020   ALBUMIN 2.6 (L) 02/10/2020   BILITOT 0.6 02/10/2020   ALKPHOS 64 02/10/2020   AST 30 02/10/2020   ALT 14 02/10/2020  .   Total Time in preparing paper work, data evaluation and todays exam - 23 minutes  Lala Lund M.D on 02/10/2020 at 10:02 AM  [facility]   Office  [phone]

## 2020-02-10 NOTE — Discharge Instructions (Signed)
You are scheduled for a Remdesivir infusion on 10/26 at 1030AM. Please come to [place], you will see a COVID infusion banner by the road.  Enter there and turn left.  There are marked spaces for Infusion. Call the number on the sign or [phone] and someone will come out and bring you inside. If someone is driving you please come to the same area and call the number and someone will come outside to get you. Thank you!      Review your chest CT results with your primary care physician, also follow with your pulmonologist and oncologist within a 1 week and review CT results with them as well.  Follow with Primary MD Wallie Char, FNP in 7 days   Get CBC, CMP, 2 view Chest X ray -  checked next visit within 1 week by Primary MD    Activity: As tolerated with Full fall precautions use walker/cane & assistance as needed  Disposition Home   Diet: Heart Healthy    Special Instructions: If you have smoked or chewed Tobacco  in the last 2 yrs please stop smoking, stop any regular Alcohol  and or any Recreational drug use.  On your next visit with your primary care physician please Get Medicines reviewed and adjusted.  Please request your Prim.MD to go over all Hospital Tests and Procedure/Radiological results at the follow up, please get all Hospital records sent to your Prim MD by signing hospital release before you go home.  If you experience worsening of your admission symptoms, develop shortness of breath, life threatening emergency, suicidal or homicidal thoughts you must seek medical attention immediately by calling 911 or calling your MD immediately  if symptoms less severe.  You Must read complete instructions/literature along with all the possible adverse reactions/side effects for all the Medicines you take and that have been prescribed to you. Take any new Medicines after you have completely understood and accpet all the possible adverse reactions/side effects.         Person Under Monitoring Name: Melvin Foley  Location: [address] [address]   Infection Prevention Recommendations for Individuals Confirmed to have, or Being Evaluated for, 2019 Novel Coronavirus (COVID-19) Infection Who Receive Care at Home  Individuals who are confirmed to have, or are being evaluated for, COVID-19 should follow the prevention steps below until a healthcare provider or local or state health department says they can return to normal activities.  Stay home except to get medical care You should restrict activities outside your home, except for getting medical care. Do not go to work, school, or public areas, and do not use public transportation or taxis.  Call ahead before visiting your doctor Before your medical appointment, call the healthcare provider and tell them that you have, or are being evaluated for, COVID-19 infection. This will help the healthcare provider's office take steps to keep other people from getting infected. Ask your healthcare provider to call the local or state health department.  Monitor your symptoms Seek prompt medical attention if your illness is worsening (e.g., difficulty breathing). Before going to your medical appointment, call the healthcare provider and tell them that you have, or are being evaluated for, COVID-19 infection. Ask your healthcare provider to call the local or state health department.  Wear a facemask You should wear a facemask that covers your nose and mouth when you are in the same room with other people and when you visit a healthcare  provider. People who live with or visit you should also wear a facemask while they are in the same room with you.  Separate yourself from other people in your home As much as possible, you should stay in a different room from other people in your home. Also, you should use a separate bathroom, if available.  Avoid sharing household items You  should not share dishes, drinking glasses, cups, eating utensils, towels, bedding, or other items with other people in your home. After using these items, you should wash them thoroughly with soap and water.  Cover your coughs and sneezes Cover your mouth and nose with a tissue when you cough or sneeze, or you can cough or sneeze into your sleeve. Throw used tissues in a lined trash can, and immediately wash your hands with soap and water for at least 20 seconds or use an alcohol-based hand rub.  Wash your [facility] your hands often and thoroughly with soap and water for at least 20 seconds. You can use an alcohol-based hand sanitizer if soap and water are not available and if your hands are not visibly dirty. Avoid touching your eyes, nose, and mouth with unwashed hands.   Prevention Steps for Caregivers and Household Members of Individuals Confirmed to have, or Being Evaluated for, COVID-19 Infection Being Cared for in the Home  If you live with, or provide care at home for, a person confirmed to have, or being evaluated for, COVID-19 infection please follow these guidelines to prevent infection:  Follow healthcare provider's instructions Make sure that you understand and can help the patient follow any healthcare provider instructions for all care.  Provide for the patient's basic needs You should help the patient with basic needs in the home and provide support for getting groceries, prescriptions, and other personal needs.  Monitor the patient's symptoms If they are getting sicker, call his or her medical provider and tell them that the patient has, or is being evaluated for, COVID-19 infection. This will help the healthcare provider's office take steps to keep other people from getting infected. Ask the healthcare provider to call the local or state health department.  Limit the number of people who have contact with the patient  If possible, have only one caregiver for the  patient.  Other household members should stay in another home or place of residence. If this is not possible, they should stay  in another room, or be separated from the patient as much as possible. Use a separate bathroom, if available.  Restrict visitors who do not have an essential need to be in the home.  Keep older adults, very young children, and other sick people away from the patient Keep older adults, very young children, and those who have compromised immune systems or chronic health conditions away from the patient. This includes people with chronic heart, lung, or kidney conditions, diabetes, and cancer.  Ensure good ventilation Make sure that shared spaces in the home have good air flow, such as from an air conditioner or an opened window, weather permitting.  Wash your hands often  Wash your hands often and thoroughly with soap and water for at least 20 seconds. You can use an alcohol based hand sanitizer if soap and water are not available and if your hands are not visibly dirty.  Avoid touching your eyes, nose, and mouth with unwashed hands.  Use disposable paper towels to dry your hands. If not available, use dedicated cloth towels and replace them when  they become wet.  Wear a facemask and gloves  Wear a disposable facemask at all times in the room and gloves when you touch or have contact with the patient's blood, body fluids, and/or secretions or excretions, such as sweat, saliva, sputum, nasal mucus, vomit, urine, or feces.  Ensure the mask fits over your nose and mouth tightly, and do not touch it during use.  Throw out disposable facemasks and gloves after using them. Do not reuse.  Wash your hands immediately after removing your facemask and gloves.  If your personal clothing becomes contaminated, carefully remove clothing and launder. Wash your hands after handling contaminated clothing.  Place all used disposable facemasks, gloves, and other waste in a lined  container before disposing them with other household waste.  Remove gloves and wash your hands immediately after handling these items.  Do not share dishes, glasses, or other household items with the patient  Avoid sharing household items. You should not share dishes, drinking glasses, cups, eating utensils, towels, bedding, or other items with a patient who is confirmed to have, or being evaluated for, COVID-19 infection.  After the person uses these items, you should wash them thoroughly with soap and water.  Wash laundry thoroughly  Immediately remove and wash clothes or bedding that have blood, body fluids, and/or secretions or excretions, such as sweat, saliva, sputum, nasal mucus, vomit, urine, or feces, on them.  Wear gloves when handling laundry from the patient.  Read and follow directions on labels of laundry or clothing items and detergent. In general, wash and dry with the warmest temperatures recommended on the label.  Clean all areas the individual has used often  Clean all touchable surfaces, such as counters, tabletops, doorknobs, bathroom fixtures, toilets, phones, keyboards, tablets, and bedside tables, every day. Also, clean any surfaces that may have blood, body fluids, and/or secretions or excretions on them.  Wear gloves when cleaning surfaces the patient has come in contact with.  Use a diluted bleach solution (e.g., dilute bleach with 1 part bleach and 10 parts water) or a household disinfectant with a label that says EPA-registered for coronaviruses. To make a bleach solution at home, add 1 tablespoon of bleach to 1 quart (4 cups) of water. For a larger supply, add  cup of bleach to 1 gallon (16 cups) of water.  Read labels of cleaning products and follow recommendations provided on product labels. Labels contain instructions for safe and effective use of the cleaning product including precautions you should take when applying the product, such as wearing gloves or  eye protection and making sure you have good ventilation during use of the product.  Remove gloves and wash hands immediately after cleaning.  Monitor yourself for signs and symptoms of illness Caregivers and household members are considered close contacts, should monitor their health, and will be asked to limit movement outside of the home to the extent possible. Follow the monitoring steps for close contacts listed on the symptom monitoring form.   ? If you have additional questions, contact your local health department or call the epidemiologist on call at [phone] (available 24/7). ? This guidance is subject to change. For the most up-to-date guidance from [facility], please refer to their website: [email or web address]

## 2020-02-10 NOTE — TOC Transition Note (Addendum)
Transition of Care [facility]) - CM/SW Discharge Note   Patient Details  Name: Melvin Foley MRN: [id number] Date of Birth: 1946-01-07  Transition of Care [facility]) CM/SW Contact:  Pollie Friar, RN Phone Number: 02/10/2020, 11:39 AM   Clinical Narrative:    Pt is discharging home with self care. He does need oxygen for home this was arranged through [employer]. Concentrator to be delivered to the room.  Son is aware of d/c and asked for a call from bedside RN when ready for pick up.  Bedside RN updated.   1400: So pt doesn't have medicare B and oxygen can only be supplied through the [place]. CM has called the respiratory department for home oxygen at [facility] [place] and [facility]. [facility]. Once the oxygen has been delivered to the home the family will have to bring a tank for transport home. Son and bedside RN are aware.     Final next level of care: Home/Self Care Barriers to Discharge: No Barriers Identified   Patient Goals and CMS Choice     Choice offered to / list presented to : Adult Children  Discharge Placement                       Discharge Plan and Services                DME Arranged: Oxygen DME Agency: [employer] Date DME Agency Contacted: 02/10/20   Representative spoke with at DME Agency: Zack            Social Determinants of Health ([place]) Interventions     Readmission Risk Interventions No flowsheet data found.

## 2020-02-10 NOTE — Progress Notes (Addendum)
SATURATION QUALIFICATIONS: (This note is used to comply with regulatory documentation for home oxygen)  Patient Saturations on Room Air at Rest = 93%  Patient Saturations on Room Air while Ambulating = 86%  Patient Saturations on 2 Liters of oxygen while Ambulating = 92%  Please briefly explain why patient needs home oxygen: Patient complained of shortness of breath and chest tightness while ambulating on room air. These symptoms resolved after initiating 2L O2 [place].

## 2020-02-10 NOTE — Progress Notes (Signed)
Patient was discharged home by MD order; discharge instructions reviewed and given to patient; IVs DIC; skin intact; patient will be escorted to the car by nurse tech via wheelchair. O2 provided to home by [facility] per case management.

## 2020-02-11 ENCOUNTER — Ambulatory Visit (HOSPITAL_COMMUNITY)
Admission: RE | Admit: 2020-02-11 | Discharge: 2020-02-11 | Disposition: A | Discharge status: Home / Self Care | Payer: No Typology Code available for payment source | Source: Ambulatory Visit | Attending: Pulmonary Disease | Admitting: Pulmonary Disease

## 2020-02-11 DIAGNOSIS — J1282 Pneumonia due to coronavirus disease 2019: Secondary | ICD-10-CM | POA: Insufficient documentation

## 2020-02-11 DIAGNOSIS — U071 COVID-19: Secondary | ICD-10-CM | POA: Diagnosis not present

## 2020-02-11 LAB — PATHOLOGIST SMEAR REVIEW

## 2020-02-11 MED ORDER — SODIUM CHLORIDE 0.9 % IV SOLN: INTRAVENOUS | Status: DC

## 2020-02-11 MED ORDER — EPINEPHRINE 0.3 MG/0.3ML IJ SOAJ: 0.3000 mg | Freq: Once | INTRAMUSCULAR | Status: DC | PRN

## 2020-02-11 MED ORDER — SODIUM CHLORIDE 0.9 % IV SOLN
100.0000 mg | Freq: Once | INTRAVENOUS | Status: AC
  Administered 2020-02-11: 100 mg via INTRAVENOUS
  Filled 2020-02-11: qty 20

## 2020-02-11 MED ORDER — METHYLPREDNISOLONE SODIUM SUCC 125 MG IJ SOLR: 125.0000 mg | Freq: Once | INTRAMUSCULAR | Status: DC | PRN

## 2020-02-11 MED ORDER — SODIUM CHLORIDE 0.9 % IV SOLN: INTRAVENOUS | Status: DC | PRN

## 2020-02-11 MED ORDER — DIPHENHYDRAMINE HCL 50 MG/ML IJ SOLN: 50.0000 mg | Freq: Once | INTRAMUSCULAR | Status: DC | PRN

## 2020-02-11 MED ORDER — ALBUTEROL SULFATE HFA 108 (90 BASE) MCG/ACT IN AERS: 2.0000 | INHALATION_SPRAY | Freq: Once | RESPIRATORY_TRACT | Status: DC | PRN

## 2020-02-11 MED ORDER — FAMOTIDINE IN NACL 20-0.9 MG/50ML-% IV SOLN: 20.0000 mg | Freq: Once | INTRAVENOUS | Status: DC | PRN

## 2020-02-11 MED ORDER — SODIUM CHLORIDE 0.9 % IV BOLUS: 1000.0000 mL | Freq: Once | INTRAVENOUS | Status: DC

## 2020-02-11 NOTE — Progress Notes (Signed)
  Diagnosis: COVID-19  Physician: Dr. Joya Gaskins  Procedure: [facility] Infusion Clinic Med: remdesivir infusion - Provided patient with remdesivir fact sheet for patients, parents and caregivers prior to infusion.  Complications: No immediate complications noted.  Discharge: Discharged home   Melvin Foley 02/11/2020

## 2020-02-13 LAB — CULTURE, BLOOD (ROUTINE X 2)
Culture: NO GROWTH
Culture: NO GROWTH
Special Requests: ADEQUATE
Special Requests: ADEQUATE

## 2020-02-15 ENCOUNTER — Inpatient Hospital Stay (HOSPITAL_COMMUNITY)
Admission: EM | Admit: 2020-02-15 | Discharge: 2020-02-28 | DRG: 177 | Disposition: A | Discharge status: Hospice | Payer: No Typology Code available for payment source | Attending: Internal Medicine | Admitting: Internal Medicine

## 2020-02-15 ENCOUNTER — Emergency Department (HOSPITAL_COMMUNITY): Payer: No Typology Code available for payment source

## 2020-02-15 ENCOUNTER — Encounter (HOSPITAL_COMMUNITY): Payer: Self-pay | Admitting: Emergency Medicine

## 2020-02-15 ENCOUNTER — Inpatient Hospital Stay (HOSPITAL_COMMUNITY): Payer: No Typology Code available for payment source

## 2020-02-15 ENCOUNTER — Other Ambulatory Visit: Payer: Self-pay

## 2020-02-15 DIAGNOSIS — J449 Chronic obstructive pulmonary disease, unspecified: Secondary | ICD-10-CM | POA: Diagnosis present

## 2020-02-15 DIAGNOSIS — Z8249 Family history of ischemic heart disease and other diseases of the circulatory system: Secondary | ICD-10-CM | POA: Diagnosis not present

## 2020-02-15 DIAGNOSIS — I959 Hypotension, unspecified: Secondary | ICD-10-CM | POA: Diagnosis present

## 2020-02-15 DIAGNOSIS — I110 Hypertensive heart disease with heart failure: Secondary | ICD-10-CM | POA: Diagnosis present

## 2020-02-15 DIAGNOSIS — U071 COVID-19: Principal | ICD-10-CM | POA: Diagnosis present

## 2020-02-15 DIAGNOSIS — J9601 Acute respiratory failure with hypoxia: Secondary | ICD-10-CM | POA: Diagnosis present

## 2020-02-15 DIAGNOSIS — R040 Epistaxis: Secondary | ICD-10-CM | POA: Diagnosis not present

## 2020-02-15 DIAGNOSIS — F209 Schizophrenia, unspecified: Secondary | ICD-10-CM | POA: Diagnosis present

## 2020-02-15 DIAGNOSIS — J701 Chronic and other pulmonary manifestations due to radiation: Secondary | ICD-10-CM | POA: Diagnosis present

## 2020-02-15 DIAGNOSIS — Z9221 Personal history of antineoplastic chemotherapy: Secondary | ICD-10-CM

## 2020-02-15 DIAGNOSIS — R7989 Other specified abnormal findings of blood chemistry: Secondary | ICD-10-CM | POA: Diagnosis present

## 2020-02-15 DIAGNOSIS — I5032 Chronic diastolic (congestive) heart failure: Secondary | ICD-10-CM | POA: Diagnosis present

## 2020-02-15 DIAGNOSIS — Z923 Personal history of irradiation: Secondary | ICD-10-CM | POA: Diagnosis not present

## 2020-02-15 DIAGNOSIS — Z66 Do not resuscitate: Secondary | ICD-10-CM | POA: Diagnosis not present

## 2020-02-15 DIAGNOSIS — Z87891 Personal history of nicotine dependence: Secondary | ICD-10-CM

## 2020-02-15 DIAGNOSIS — Z8673 Personal history of transient ischemic attack (TIA), and cerebral infarction without residual deficits: Secondary | ICD-10-CM

## 2020-02-15 DIAGNOSIS — Z7189 Other specified counseling: Secondary | ICD-10-CM | POA: Diagnosis not present

## 2020-02-15 DIAGNOSIS — R1011 Right upper quadrant pain: Secondary | ICD-10-CM

## 2020-02-15 DIAGNOSIS — S2241XA Multiple fractures of ribs, right side, initial encounter for closed fracture: Secondary | ICD-10-CM | POA: Diagnosis present

## 2020-02-15 DIAGNOSIS — A419 Sepsis, unspecified organism: Secondary | ICD-10-CM | POA: Insufficient documentation

## 2020-02-15 DIAGNOSIS — M7989 Other specified soft tissue disorders: Secondary | ICD-10-CM | POA: Diagnosis not present

## 2020-02-15 DIAGNOSIS — R54 Age-related physical debility: Secondary | ICD-10-CM | POA: Diagnosis present

## 2020-02-15 DIAGNOSIS — J1282 Pneumonia due to coronavirus disease 2019: Secondary | ICD-10-CM | POA: Diagnosis present

## 2020-02-15 DIAGNOSIS — Z823 Family history of stroke: Secondary | ICD-10-CM | POA: Diagnosis not present

## 2020-02-15 DIAGNOSIS — E785 Hyperlipidemia, unspecified: Secondary | ICD-10-CM | POA: Diagnosis present

## 2020-02-15 DIAGNOSIS — J440 Chronic obstructive pulmonary disease with acute lower respiratory infection: Secondary | ICD-10-CM | POA: Diagnosis present

## 2020-02-15 DIAGNOSIS — C3490 Malignant neoplasm of unspecified part of unspecified bronchus or lung: Secondary | ICD-10-CM

## 2020-02-15 DIAGNOSIS — W06XXXA Fall from bed, initial encounter: Secondary | ICD-10-CM | POA: Diagnosis present

## 2020-02-15 DIAGNOSIS — Z515 Encounter for palliative care: Secondary | ICD-10-CM

## 2020-02-15 DIAGNOSIS — R0602 Shortness of breath: Secondary | ICD-10-CM

## 2020-02-15 DIAGNOSIS — J069 Acute upper respiratory infection, unspecified: Secondary | ICD-10-CM | POA: Diagnosis present

## 2020-02-15 DIAGNOSIS — C3431 Malignant neoplasm of lower lobe, right bronchus or lung: Secondary | ICD-10-CM | POA: Diagnosis present

## 2020-02-15 DIAGNOSIS — Z7902 Long term (current) use of antithrombotics/antiplatelets: Secondary | ICD-10-CM | POA: Diagnosis not present

## 2020-02-15 DIAGNOSIS — S2239XA Fracture of one rib, unspecified side, initial encounter for closed fracture: Secondary | ICD-10-CM | POA: Diagnosis present

## 2020-02-15 LAB — I-STAT ARTERIAL BLOOD GAS, ED
Acid-base deficit: 2 mmol/L (ref 0.0–2.0)
Bicarbonate: 19.4 mmol/L — ABNORMAL LOW (ref 20.0–28.0)
Calcium, Ion: 1.17 mmol/L (ref 1.15–1.40)
HCT: 45 % (ref 39.0–52.0)
Hemoglobin: 15.3 g/dL (ref 13.0–17.0)
O2 Saturation: 99 %
Potassium: 3.9 mmol/L (ref 3.5–5.1)
Sodium: 137 mmol/L (ref 135–145)
TCO2: 20 mmol/L — ABNORMAL LOW (ref 22–32)
pCO2 arterial: 26.3 mmHg — ABNORMAL LOW (ref 32.0–48.0)
pH, Arterial: 7.477 — ABNORMAL HIGH (ref 7.350–7.450)
pO2, Arterial: 114 mmHg — ABNORMAL HIGH (ref 83.0–108.0)

## 2020-02-15 LAB — PROCALCITONIN: Procalcitonin: 0.11 ng/mL

## 2020-02-15 LAB — COMPREHENSIVE METABOLIC PANEL
ALT: 17 U/L (ref 0–44)
AST: 31 U/L (ref 15–41)
Albumin: 3.1 g/dL — ABNORMAL LOW (ref 3.5–5.0)
Alkaline Phosphatase: 72 U/L (ref 38–126)
Anion gap: 11 (ref 5–15)
BUN: 33 mg/dL — ABNORMAL HIGH (ref 8–23)
CO2: 23 mmol/L (ref 22–32)
Calcium: 8.9 mg/dL (ref 8.9–10.3)
Chloride: 102 mmol/L (ref 98–111)
Creatinine, Ser: 1.23 mg/dL (ref 0.61–1.24)
GFR, Estimated: >60 mL/min (ref >60)
Glucose, Bld: 114 mg/dL — ABNORMAL HIGH (ref 70–99)
Potassium: 3.9 mmol/L (ref 3.5–5.1)
Sodium: 136 mmol/L (ref 135–145)
Total Bilirubin: 1.2 mg/dL (ref 0.3–1.2)
Total Protein: 7.6 g/dL (ref 6.5–8.1)

## 2020-02-15 LAB — CBC WITH DIFFERENTIAL/PLATELET
Abs Immature Granulocytes: 0.33 10*3/uL — ABNORMAL HIGH (ref 0.00–0.07)
Abs Immature Granulocytes: 0.21 10*3/uL — ABNORMAL HIGH (ref 0.00–0.07)
Basophils Absolute: 0.1 10*3/uL (ref 0.0–0.1)
Basophils Absolute: 0 10*3/uL (ref 0.0–0.1)
Basophils Relative: 1 %
Basophils Relative: 1 %
Eosinophils Absolute: 0 10*3/uL (ref 0.0–0.5)
Eosinophils Absolute: 0 10*3/uL (ref 0.0–0.5)
Eosinophils Relative: 0 %
Eosinophils Relative: 0 %
HCT: 52.6 % — ABNORMAL HIGH (ref 39.0–52.0)
HCT: 39.7 % (ref 39.0–52.0)
Hemoglobin: 16.9 g/dL (ref 13.0–17.0)
Hemoglobin: 12.9 g/dL — ABNORMAL LOW (ref 13.0–17.0)
Immature Granulocytes: 4 %
Immature Granulocytes: 4 %
Lymphocytes Relative: 8 %
Lymphocytes Relative: 5 %
Lymphs Abs: 0.7 10*3/uL (ref 0.7–4.0)
Lymphs Abs: 0.3 10*3/uL — ABNORMAL LOW (ref 0.7–4.0)
MCH: 32.8 pg (ref 26.0–34.0)
MCH: 33 pg (ref 26.0–34.0)
MCHC: 32.1 g/dL (ref 30.0–36.0)
MCHC: 32.5 g/dL (ref 30.0–36.0)
MCV: 101.9 fL — ABNORMAL HIGH (ref 80.0–100.0)
MCV: 101.5 fL — ABNORMAL HIGH (ref 80.0–100.0)
Monocytes Absolute: 1.3 10*3/uL — ABNORMAL HIGH (ref 0.1–1.0)
Monocytes Absolute: 0.7 10*3/uL (ref 0.1–1.0)
Monocytes Relative: 14 %
Monocytes Relative: 12 %
Neutro Abs: 6.8 10*3/uL (ref 1.7–7.7)
Neutro Abs: 4.3 10*3/uL (ref 1.7–7.7)
Neutrophils Relative %: 73 %
Neutrophils Relative %: 78 %
Platelets: 308 10*3/uL (ref 150–400)
Platelets: 188 10*3/uL (ref 150–400)
RBC: 5.16 MIL/uL (ref 4.22–5.81)
RBC: 3.91 MIL/uL — ABNORMAL LOW (ref 4.22–5.81)
RDW: 14.4 % (ref 11.5–15.5)
RDW: 14.4 % (ref 11.5–15.5)
WBC: 9.2 10*3/uL (ref 4.0–10.5)
WBC: 5.6 10*3/uL (ref 4.0–10.5)
nRBC: 0 % (ref 0.0–0.2)
nRBC: 0 % (ref 0.0–0.2)

## 2020-02-15 LAB — LACTIC ACID, PLASMA
Lactic Acid, Venous: 3.6 mmol/L (ref 0.5–1.9)
Lactic Acid, Venous: 2.5 mmol/L (ref 0.5–1.9)
Lactic Acid, Venous: 0.9 mmol/L (ref 0.5–1.9)

## 2020-02-15 LAB — D-DIMER, QUANTITATIVE: D-Dimer, Quant: 4.97 ug/mL-FEU — ABNORMAL HIGH (ref 0.00–0.50)

## 2020-02-15 LAB — LACTATE DEHYDROGENASE: LDH: 283 U/L — ABNORMAL HIGH (ref 98–192)

## 2020-02-15 LAB — FERRITIN: Ferritin: 1614 ng/mL — ABNORMAL HIGH (ref 24–336)

## 2020-02-15 LAB — TRIGLYCERIDES: Triglycerides: 159 mg/dL — ABNORMAL HIGH (ref <150)

## 2020-02-15 LAB — BRAIN NATRIURETIC PEPTIDE: B Natriuretic Peptide: 118.2 pg/mL — ABNORMAL HIGH (ref 0.0–100.0)

## 2020-02-15 LAB — MRSA PCR SCREENING: MRSA by PCR: NEGATIVE

## 2020-02-15 LAB — C-REACTIVE PROTEIN: CRP: 7.1 mg/dL — ABNORMAL HIGH (ref <1.0)

## 2020-02-15 LAB — FIBRINOGEN: Fibrinogen: 651 mg/dL — ABNORMAL HIGH (ref 210–475)

## 2020-02-15 MED ORDER — ONDANSETRON HCL 4 MG/2ML IJ SOLN: 4.0000 mg | Freq: Four times a day (QID) | INTRAMUSCULAR | Status: DC | PRN

## 2020-02-15 MED ORDER — SODIUM CHLORIDE 0.9% FLUSH
3.0000 mL | Freq: Two times a day (BID) | INTRAVENOUS | Status: DC
  Administered 2020-02-16 – 2020-02-28 (×26): 3 mL via INTRAVENOUS

## 2020-02-15 MED ORDER — ENOXAPARIN SODIUM 40 MG/0.4ML ~~LOC~~ SOLN
40.0000 mg | SUBCUTANEOUS | Status: DC
  Administered 2020-02-15 – 2020-02-25 (×11): 40 mg via SUBCUTANEOUS
  Filled 2020-02-15 (×13): qty 0.4

## 2020-02-15 MED ORDER — GUAIFENESIN ER 600 MG PO TB12
600.0000 mg | ORAL_TABLET | Freq: Two times a day (BID) | ORAL | Status: DC
  Administered 2020-02-15 – 2020-02-28 (×24): 600 mg via ORAL
  Filled 2020-02-15 (×25): qty 1

## 2020-02-15 MED ORDER — GUAIFENESIN-DM 100-10 MG/5ML PO SYRP
10.0000 mL | ORAL_SOLUTION | ORAL | Status: DC | PRN
  Administered 2020-02-15: 10 mL via ORAL
  Filled 2020-02-15: qty 10

## 2020-02-15 MED ORDER — GEMFIBROZIL 600 MG PO TABS
600.0000 mg | ORAL_TABLET | Freq: Two times a day (BID) | ORAL | Status: DC
  Administered 2020-02-16 – 2020-02-26 (×21): 600 mg via ORAL
  Filled 2020-02-15 (×23): qty 1

## 2020-02-15 MED ORDER — ZINC SULFATE 220 (50 ZN) MG PO CAPS
220.0000 mg | ORAL_CAPSULE | Freq: Every day | ORAL | Status: DC
  Administered 2020-02-16 – 2020-02-26 (×11): 220 mg via ORAL
  Filled 2020-02-15 (×11): qty 1

## 2020-02-15 MED ORDER — SODIUM CHLORIDE 0.9 % IV SOLN: INTRAVENOUS | Status: DC

## 2020-02-15 MED ORDER — METHYLPREDNISOLONE 4 MG PO TBPK: ORAL_TABLET | Freq: Every day | ORAL | Status: DC

## 2020-02-15 MED ORDER — VANCOMYCIN HCL 500 MG/100ML IV SOLN
500.0000 mg | Freq: Two times a day (BID) | INTRAVENOUS | Status: DC
  Administered 2020-02-16: 500 mg via INTRAVENOUS
  Filled 2020-02-15: qty 100

## 2020-02-15 MED ORDER — SODIUM CHLORIDE 0.9 % IV SOLN
1000.0000 mL | INTRAVENOUS | Status: DC
  Administered 2020-02-15: 1000 mL via INTRAVENOUS

## 2020-02-15 MED ORDER — MORPHINE SULFATE (PF) 2 MG/ML IV SOLN
1.0000 mg | INTRAVENOUS | Status: DC | PRN
  Administered 2020-02-15: 1 mg via INTRAVENOUS
  Filled 2020-02-15 (×2): qty 1

## 2020-02-15 MED ORDER — ACETAMINOPHEN 325 MG PO TABS
650.0000 mg | ORAL_TABLET | Freq: Four times a day (QID) | ORAL | Status: DC | PRN
  Administered 2020-02-15 – 2020-02-19 (×3): 650 mg via ORAL
  Filled 2020-02-15 (×3): qty 2

## 2020-02-15 MED ORDER — PANTOPRAZOLE SODIUM 40 MG PO TBEC
40.0000 mg | DELAYED_RELEASE_TABLET | Freq: Every day | ORAL | Status: DC
  Administered 2020-02-16 – 2020-02-28 (×13): 40 mg via ORAL
  Filled 2020-02-15 (×13): qty 1

## 2020-02-15 MED ORDER — ASCORBIC ACID 500 MG PO TABS
500.0000 mg | ORAL_TABLET | Freq: Every day | ORAL | Status: DC
  Administered 2020-02-16 – 2020-02-26 (×11): 500 mg via ORAL
  Filled 2020-02-15 (×11): qty 1

## 2020-02-15 MED ORDER — SODIUM CHLORIDE 0.9 % IV SOLN
2.0000 g | Freq: Once | INTRAVENOUS | Status: AC
  Administered 2020-02-15: 2 g via INTRAVENOUS
  Filled 2020-02-15: qty 2

## 2020-02-15 MED ORDER — METHYLPREDNISOLONE SODIUM SUCC 40 MG IJ SOLR
0.5000 mg/kg | Freq: Two times a day (BID) | INTRAMUSCULAR | Status: DC
  Administered 2020-02-15: 33.6 mg via INTRAVENOUS
  Filled 2020-02-15: qty 1

## 2020-02-15 MED ORDER — ROSUVASTATIN CALCIUM 20 MG PO TABS
40.0000 mg | ORAL_TABLET | Freq: Every day | ORAL | Status: DC
  Administered 2020-02-16 – 2020-02-26 (×11): 40 mg via ORAL
  Filled 2020-02-15 (×11): qty 2

## 2020-02-15 MED ORDER — CLOPIDOGREL BISULFATE 75 MG PO TABS
75.0000 mg | ORAL_TABLET | Freq: Every day | ORAL | Status: DC
  Administered 2020-02-15 – 2020-02-25 (×11): 75 mg via ORAL
  Filled 2020-02-15 (×13): qty 1

## 2020-02-15 MED ORDER — SODIUM CHLORIDE 0.9 % IV SOLN
2.0000 g | Freq: Two times a day (BID) | INTRAVENOUS | Status: DC
  Administered 2020-02-16: 2 g via INTRAVENOUS
  Filled 2020-02-15: qty 2

## 2020-02-15 MED ORDER — THIOTHIXENE 5 MG PO CAPS
15.0000 mg | ORAL_CAPSULE | Freq: Every day | ORAL | Status: DC
  Administered 2020-02-15 – 2020-02-25 (×11): 15 mg via ORAL
  Filled 2020-02-15 (×14): qty 3

## 2020-02-15 MED ORDER — SODIUM CHLORIDE 0.9 % IV BOLUS
1000.0000 mL | Freq: Once | INTRAVENOUS | Status: AC
  Administered 2020-02-15: 1000 mL via INTRAVENOUS

## 2020-02-15 MED ORDER — ONDANSETRON HCL 4 MG PO TABS: 4.0000 mg | ORAL_TABLET | Freq: Four times a day (QID) | ORAL | Status: DC | PRN

## 2020-02-15 MED ORDER — IOHEXOL 350 MG/ML SOLN
60.0000 mL | Freq: Once | INTRAVENOUS | Status: AC | PRN
  Administered 2020-02-15: 60 mL via INTRAVENOUS

## 2020-02-15 MED ORDER — ALBUTEROL SULFATE HFA 108 (90 BASE) MCG/ACT IN AERS
1.0000 | INHALATION_SPRAY | Freq: Four times a day (QID) | RESPIRATORY_TRACT | Status: DC | PRN
  Administered 2020-02-26: 2 via RESPIRATORY_TRACT
  Filled 2020-02-15: qty 6.7

## 2020-02-15 MED ORDER — VANCOMYCIN HCL 1500 MG/300ML IV SOLN
1500.0000 mg | Freq: Once | INTRAVENOUS | Status: AC
  Administered 2020-02-15: 1500 mg via INTRAVENOUS
  Filled 2020-02-15: qty 300

## 2020-02-15 MED ORDER — HYDROCODONE-ACETAMINOPHEN 5-325 MG PO TABS
1.0000 | ORAL_TABLET | ORAL | Status: DC | PRN
  Administered 2020-02-17: 1 via ORAL
  Administered 2020-02-18 – 2020-02-20 (×2): 2 via ORAL
  Filled 2020-02-15: qty 1
  Filled 2020-02-15 (×2): qty 2

## 2020-02-15 MED ORDER — THIOTHIXENE 5 MG PO CAPS
15.0000 mg | ORAL_CAPSULE | Freq: Every day | ORAL | Status: DC
  Filled 2020-02-15: qty 1

## 2020-02-15 NOTE — ED Notes (Signed)
Report given to floor Lexine Baton, Therapist, sports.

## 2020-02-15 NOTE — Progress Notes (Signed)
Melvin Foley Devora [id number] Admitted to 1I96: 02/15/2020 9:54 PM Attending Provider: Toy Baker, MD    Melvin Foley is a 74 y.o. male patient admitted from ED awake, alert  & orientated  X 3,  Prior, VSS - Blood pressure 109/76, pulse 77, temperature 98.2 F (36.8 C), temperature source Oral, resp. rate 20, SpO2 94 %., O2 6 L nasal cannular, no c/o shortness of breath, no c/o chest pain, no distress noted. Tele placed and pt is currently running: SR   IV site WDL:  with a transparent dsg that's clean dry and intact.  Allergies:   Allergies  Allergen Reactions   Statins Rash and Other (See Comments)    Bone pain, Myalgias     Past Medical History:  Diagnosis Date   Anxiety    takes medication   Arthritis    Bipolar 1 disorder (HCC)    Bronchitis    Cancer (HCC)    Carotid stenosis, left    GERD (gastroesophageal reflux disease)    H/O hiatal hernia    Hypertension    Shortness of breath    Sleep apnea    2 YEARS  Elim    Stroke [facility])     History:  obtained from patient  Pt orientation to unit, room and routine. Information packet given to patient.  Admission INP armband ID verified with patient, and in place. SR up x 2, fall risk assessment complete with Patient verbalizing understanding of risks associated with falls. Pt verbalizes an understanding of how to use the call bell and to call for help before getting out of bed.  Skin, see flow sheet.  Will cont to monitor and assist as needed.  Parthenia Ames, RN 02/15/2020 9:54 PM

## 2020-02-15 NOTE — ED Notes (Signed)
Ultrasound is at bedside at this time.

## 2020-02-15 NOTE — Progress Notes (Addendum)
Pharmacy Antibiotic Note  Melvin Foley is a 74 y.o. male admitted on 02/15/2020 with pneumonia.  Pharmacy has been consulted for cefepime and  vancomycin dosing.  Gram negative coverage per MD.    Plan: Vancomycin 1500 mg IV x 1, then 500 mg IV every 12 hours (target vancomycin trough 15-20) Add MRSA PCR Cefepime 2g IV every 12 hours Monitor renal function, Cx/PCR and clinical progression to narrow Vancomycin trough at steady state     Temp (24hrs), Avg:98.7 F (37.1 C), Min:98.7 F (37.1 C), Max:98.7 F (37.1 C)  Recent Labs  Lab 02/09/20 0349 02/10/20 0105 02/15/20 1243 02/15/20 1426  WBC 3.6* 8.0 9.2  --   CREATININE 0.87 1.03  --  1.23  LATICACIDVEN  --   --  3.6* 2.5*    Estimated Creatinine Clearance: 49.2 mL/min (by C-G formula based on SCr of 1.23 mg/dL).    Allergies  Allergen Reactions  . Statins Rash and Other (See Comments)    Bone pain, Myalgias    Bertis Ruddy, PharmD Clinical Pharmacist ED Pharmacist Phone # [phone] 02/15/2020 6:42 PM

## 2020-02-15 NOTE — ED Notes (Signed)
Wife [phone] [name]

## 2020-02-15 NOTE — ED Notes (Signed)
Patient transported to CT 

## 2020-02-15 NOTE — ED Provider Notes (Addendum)
[facility] EMERGENCY DEPARTMENT Provider Note   CSN: [id number] Arrival date & time: 02/15/20  1138     History Chief Complaint  Patient presents with  . Covid Positive  . Pneumonia    Melvin Foley is a 74 y.o. male.  HPI  Level 5 caveat patient has difficulty giving accurate history-it is difficult to delineate whether this is secondary to memory issues, decreased hearing with mask interface and oxygen, or other causes.  However, patient is unable to give accurate timeframe and most of history is obtained from chart. 74 year old male recent discharge after COVID-19 infection diagnosed 1022 presents today with increased dyspnea, cough, and generalized weakness.  Hospital course during admission 1022 05/29/2023 patient had COVID-19 pneumonia and was treated with steroids and remdesivir.  At that time he was asymptomatic at rest but became hypoxic with ambulation and was discharged home on steroid taper, rescue inhaler, and oxygen.  Additionally patient has history of COPD and stage IIIb adenocarcinoma of the lung.  Patient recently completed radiation therapy and it is reported that he is on chemo [facility] for this.  CT of his chest that continues show a right lower lobe mass.  COPD was stable during his hospitalization.  He has a history of schizophrenia which appears stable.  His D-dimer was elevated and thought to be due to inflammation with leg ultrasounds negative for DVT on discharge    Past Medical History:  Diagnosis Date  . Anxiety    takes medication  . Arthritis   . Bipolar 1 disorder ([place])   . Bronchitis   . Cancer ([place])   . Carotid stenosis, left   . GERD (gastroesophageal reflux disease)   . H/O hiatal hernia   . Hypertension   . Shortness of breath   . Sleep apnea    2 YEARS  [place]   . Stroke [facility])     Patient Active Problem List   Diagnosis Date Noted  . Chronic diastolic CHF (congestive heart failure) ([place]) 02/08/2020  .  COPD (chronic obstructive pulmonary disease) ([place]) 02/08/2020  . Acute respiratory disease due to COVID-19 virus 02/07/2020  . 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 Fever 12/21/2019  . Primary adenocarcinoma of lower lobe of right lung ([place]) 12/21/2019  . Schizophrenia ([place]) 12/21/2019  . Bipolar disorder ([place]) 12/21/2019  . Neutropenia ([place]) 12/15/2019  . Neutropenic fever ([place]) 12/14/2019  . Carotid stenosis 05/03/2017  . Difficulty with speech   . TIA (transient ischemic attack) 04/30/2017  . Hyperlipidemia 04/30/2017  . Proximal humerus fracture 03/02/2011    Past Surgical History:  Procedure Laterality Date  . BIOPSY  12/25/2019   Procedure: BIOPSY;  Surgeon: Rush Landmark Telford Nab., MD;  Location: [place];  Service: Gastroenterology;;  . CHOLECYSTECTOMY    . ENDARTERECTOMY Left 05/03/2017   Procedure: ENDARTERECTOMY CAROTID LEFT;  Surgeon: Rosetta Posner, MD;  Location[facility] OR;  Service: Vascular;  Laterality: Left;  . ESOPHAGOGASTRODUODENOSCOPY (EGD) WITH PROPOFOL N/A 12/25/2019   Procedure: ESOPHAGOGASTRODUODENOSCOPY (EGD) WITH PROPOFOL;  Surgeon: Irving Copas., MD;  Location: [place];  Service: Gastroenterology;  Laterality: N/A;  . FRACTURE SURGERY     right ankle  . head injury    . KNEE SURGERY     x2  . ORIF SHOULDER FRACTURE  03/03/2011   Procedure: OPEN REDUCTION INTERNAL FIXATION (ORIF) SHOULDER FRACTURE;  Surgeon: Augustin Schooling;  Location: [facility];  Service: Orthopedics;  Laterality: Left;  LEFT PROXIMAL HUMERUS Open Reduction internal fixation  .  SHOULDER ARTHROSCOPY WITH SUBACROMIAL DECOMPRESSION AND BICEP TENDON REPAIR  02/24/2012   Procedure: SHOULDER ARTHROSCOPY WITH SUBACROMIAL DECOMPRESSION AND BICEP TENDON REPAIR;  Surgeon: Augustin Schooling, MD;  Location: [place];  Service: Orthopedics;  Laterality: Left;  with capsular release and lysis of adhesions  . TONSILLECTOMY         Family History  Problem Relation Age of Onset  . Congestive  Heart Failure Mother   . Stroke Mother   . Congestive Heart Failure Father     Social History   Tobacco Use  . Smoking status: Former Smoker    Packs/day: 1.50  . Smokeless tobacco: Never Used  . Tobacco comment: stopped smoking 20 years ago  Vaping Use  . Vaping Use: Never used  Substance Use Topics  . Alcohol use: No    Comment: past 20 years ago  . Drug use: Not Currently    Types: Marijuana, LSD    Home Medications Prior to Admission medications   Medication Sig Start Date End Date Taking? Authorizing Provider  albuterol (VENTOLIN HFA) 108 (90 Base) MCG/ACT inhaler Inhale 1-2 puffs into the lungs every 6 (six) hours as needed for wheezing or shortness of breath. 02/10/20   Thurnell Lose, MD  Cholecalciferol (VITAMIN D3) 50 MCG (2000 UT) capsule Take 2,000 Units by mouth daily.    [provider]  clopidogrel (PLAVIX) 75 MG tablet Take 1 tablet (75 mg total) by mouth at bedtime. 05/01/17   Geradine Girt, DO  gemfibrozil (LOPID) 600 MG tablet Take 600 mg by mouth 2 (two) times daily before a meal.    [provider]  guaiFENesin (MUCINEX) 600 MG 12 hr tablet Take 600 mg by mouth 2 (two) times daily.  08/06/19   [provider]  methylPREDNISolone (MEDROL DOSEPAK) 4 MG TBPK tablet follow package directions 02/10/20   Thurnell Lose, MD  omeprazole (PRILOSEC) 40 MG capsule Take 40 mg by mouth daily.    [provider]  ondansetron (ZOFRAN) 8 MG tablet Take 8 mg by mouth 3 (three) times daily as needed for nausea or vomiting.    [provider]  rosuvastatin (CRESTOR) 40 MG tablet Take 40 mg by mouth daily.    [provider]  thiothixene (NAVANE) 5 MG capsule Take 15 mg by mouth at bedtime.    [provider]    Allergies    Statins  Review of Systems   Review of Systems  Physical Exam Updated Vital Signs BP (!) 130/108   Pulse (!) 126   Temp 98.7 F (37.1 C)   Resp (!) 30   SpO2 100%   Physical  Exam  ED Results / Procedures / Treatments   Labs (all labs ordered are listed, but only abnormal results are displayed) Labs Reviewed  CULTURE, BLOOD (ROUTINE X 2)  CULTURE, BLOOD (ROUTINE X 2)  LACTIC ACID, PLASMA  LACTIC ACID, PLASMA  CBC WITH DIFFERENTIAL/PLATELET  COMPREHENSIVE METABOLIC PANEL  D-DIMER, QUANTITATIVE (NOT AT [facility])  PROCALCITONIN  LACTATE DEHYDROGENASE  FERRITIN  TRIGLYCERIDES  FIBRINOGEN  C-REACTIVE PROTEIN  BRAIN NATRIURETIC PEPTIDE  I-STAT ARTERIAL BLOOD GAS, ED    EKG EKG Interpretation  Date/Time:  Saturday February 15 2020 11:43:09 EDT Ventricular Rate:  129 PR Interval:    QRS Duration: 77 QT Interval:  275 QTC Calculation: 403 R Axis:   -78 Text Interpretation: Sinus tachycardia Ventricular premature complex Aberrant conduction of SV complex(es) Abnormal R-wave progression, late transition Inferior infarct, old Confirmed by Sherylann Vangorden,  Andee Poles [phone]) on 02/15/2020 1:22:38 PM   Radiology DG Chest Port 1 View  Result Date: 02/15/2020 CLINICAL DATA:  Cough, worsening dyspnea. EXAM: PORTABLE CHEST 1 VIEW COMPARISON:  02/07/2020 FINDINGS: Improved aeration at the right lung base compared to the prior examination. Interstitial thickening and chronic changes at the left lung base are unchanged. Heart and mediastinum are grossly stable. IMPRESSION: 1. Improved aeration at the right lung base compared to the recent comparison examination. 2. Chronic changes. Electronically Signed   By: Markus Daft M.D.   On: 02/15/2020 13:16    Procedures .Critical Care Performed by: Pattricia Boss, MD Authorized by: Pattricia Boss, MD   Critical care provider statement:    Critical care time (minutes):  45   Critical care end time:  02/15/2020 6:39 PM   Critical care was necessary to treat or prevent imminent or life-threatening deterioration of the following conditions:  Respiratory failure   Critical care was time spent personally by me on the following activities:   Discussions with consultants, evaluation of patient's response to treatment, examination of patient, ordering and performing treatments and interventions, ordering and review of laboratory studies, ordering and review of radiographic studies, pulse oximetry, re-evaluation of patient's condition, obtaining history from patient or surrogate and review of old charts   (including critical care time)  Medications Ordered in ED Medications  0.9 %  sodium chloride infusion (has no administration in time range)    ED Course  I have reviewed the triage vital signs and the nursing notes.  Pertinent labs & imaging results that were available during my care of the patient were reviewed by me and considered in my medical decision making (see chart for details).  Clinical Course as of Feb 14 1429  Sat Feb 15, 2020  1407 Lactic reviewed- will give fluid bolus Wife requesting palliative care Awaiting cmet   [DR]  1423 DG Chest Port 1 View [MJ]    Clinical Course User Index [DR] Pattricia Boss, MD [MJ] Trena Platt  Wife requesting palliative care.  Discussed sepsis and if patient would want ventilator if needed- patient and wife state they would not want patient to be placed on ventilator.  Patient states he does want chest compressions and cpr  CTA reviewed NO pe, worsening infiltrates 74 yo male with copd, lung ca, covid presents with worsening oxygen demand.  Now requiring high flow [place] No evidence of pe  Initial lactic elevated- iv fluids given and lactic acid decreasing, tachycardia resolved with fluids, high flow [place] Plan iv abx for possible secondary bacterial pneumonia IV fluids one liter f/b second after lactic  Plan admission for ongoing evaluation and tx Discussed with Dr. Roel Cluck who will see for admission  MDM Rules/Calculators/A&P                           Final Clinical Impression(s) / ED Diagnoses Final diagnoses:  Pneumonia due to COVID-19 virus    Rx /  DC Orders ED Discharge Orders    None       Pattricia Boss, MD 02/15/20 Junious Dresser    Pattricia Boss, MD 02/15/20 1907

## 2020-02-15 NOTE — H&P (Signed)
Melvin Foley MLY:[id number] DOB: [birth year] DOA: 02/15/2020     PCP: Wallie Char, FNP   Outpatient Specialists:     Oncology  At [facility] GI  Dr. Rush Landmark    Patient arrived to ER on 02/15/20 at 1138 Referred by Attending Pattricia Boss, MD   Patient coming from: home Lives    With family    Chief Complaint:   Chief Complaint  Patient presents with  . Covid Positive  . Pneumonia    HPI: Melvin Foley is a 74 y.o. male with medical history significant of  stage IIIb adenocarcinoma, COPD, recent COVID -19 infection chronic diastolic CHF, history of CVA history of schizophrenia  Presented with worsening shortness of breath Recent Covid infection diagnosed on 22 October started on steroids similar distribution usual days of remdesivir IV initially did very well and was able to be discharged to home Was discharged Medrol Dosepak and home oxygen as needed  Monitor 10 days since his symptoms have started patient became more short of breath EMS arrived to home found to be hypoxic satting 85% placed on on 15 L nonrebreather   Pt states he has not been eating for the past 2-3 days Reported that he fell out of bed today  Denies hitting his head   Infectious risk factors:  Reports  shortness of breath, dry cough,    KNOWN COVID POSITIVE   Has NOt been vaccinated against COVID (refuses)    Lab Results  Component Value Date   [place] (A) 02/07/2020   [place] NEGATIVE 02/03/2020   [place] NEGATIVE 12/20/2019   [place] NEGATIVE 12/14/2019     Regarding pertinent Chronic problems:    LUNG CAA - stage IIIb adenocarcinoma-finished radiation approximately 6 weeks back (follows at [facility] and the [place] system)    Hyperlipidemia -  on statins Crestor Lipid Panel     Component Value Date/Time   CHOL 172 05/01/2017 0016   TRIG 159 (H) 02/15/2020 1243   HDL 42 05/01/2017 0016   CHOLHDL 4.1 05/01/2017 0016   VLDL 22 05/01/2017 0016    LDLCALC 108 (H) 05/01/2017 0016   chronic CHF diastolic  - last echo 8127 Grade 1 diastolic CHF    History of schizophrenia on thiothixene    COPD - chronic stable    Hx of CVA -  with/out residual deficits on  Plavix    While in ER: Initially tachycardic elevated D-dimer CTA negative for PE ut showed Right side rib frx 7-9th Lactic acid elevated up to 3  wife requested palliative care consult consult patient states he wishes to be DNI Started on Antibiotics cefepime IV fluids given with improvement of lactic acid up to 2.5    Hospitalist was called for admission for COVID PNA resulting on acute respiratory failure  The following Work up has been ordered so far:  Orders Placed This Encounter  Procedures  . Critical Care  . Blood Culture (routine x 2)  . MRSA PCR Screening  . DG Chest Port 1 View  . CT Angio Chest PE W and/or Wo Contrast  . Lactic acid, plasma  . CBC WITH DIFFERENTIAL  . Ferritin  . Triglycerides  . C-reactive protein  . Brain natriuretic peptide  . Fibrinogen  . D-dimer, quantitative (not at [facility])  . Comprehensive metabolic panel  . Lactate dehydrogenase  . Procalcitonin  . Diet NPO time specified  . Cardiac monitoring  . Insert peripheral IV x 2  . Initiate Carrier Fluid  Protocol  . Place surgical mask on patient  . Patient to wear surgical mask during transportation  . Assess patient for ability to self-prone. If able (can move self in bed, ambulate) and stable (SpO2 and oxygen requirement):  . RN/NT - Document specific oxygen requirements in CHL  . Notify EDP if new oxygen requirements escalates > 4L per minute [place]  . RN to draw the following extra tubes:  . Consult to Palliative Care  . Consult to hospitalist  ALL PATIENTS BEING ADMITTED/HAVING PROCEDURES NEED COVID-19 SCREENING  . Consult to hospitalist  ALL PATIENTS BEING ADMITTED/HAVING PROCEDURES NEED COVID-19 SCREENING  . vancomycin per pharmacy consult  . Airborne and Contact  precautions  . Pulse oximetry, continuous  . Oxygen therapy Mode or (Route): High flow nasal cannula; Keep 02 saturation: greater than 88%.  . I-Stat arterial blood gas, [facility] ED)  . EKG 12-Lead  . ED EKG 12-Lead   Following Medications were ordered in ER: Medications  0.9 %  sodium chloride infusion (1,000 mLs Intravenous New Bag/Given 02/15/20 1309)  vancomycin (VANCOREADY) IVPB 1500 mg/300 mL (has no administration in time range)  vancomycin (VANCOREADY) IVPB 500 mg/100 mL (has no administration in time range)  sodium chloride 0.9 % bolus 1,000 mL (0 mLs Intravenous Stopped 02/15/20 1852)  iohexol (OMNIPAQUE) 350 MG/ML injection 60 mL (60 mLs Intravenous Contrast Given 02/15/20 1725)  ceFEPIme (MAXIPIME) 2 g in sodium chloride 0.9 % 100 mL IVPB (2 g Intravenous New Bag/Given 02/15/20 1853)        Consult Orders  (From admission, onward)         Start     Ordered   02/15/20 1748  Consult to hospitalist  ALL PATIENTS BEING ADMITTED/HAVING PROCEDURES NEED COVID-19 SCREENING  Once       Comments: ALL PATIENTS BEING ADMITTED/HAVING PROCEDURES NEED COVID-19 SCREENING  Provider:  (Not yet assigned)  Question Answer Comment  Place call to: [facility]   Reason for Consult Admit      02/15/20 1747   02/15/20 1652  Consult to hospitalist  ALL PATIENTS BEING ADMITTED/HAVING PROCEDURES NEED COVID-19 SCREENING  Once       Comments: ALL PATIENTS BEING ADMITTED/HAVING PROCEDURES NEED COVID-19 SCREENING  Provider:  (Not yet assigned)  Question Answer Comment  Place call to: [facility]   Reason for Consult Admit      02/15/20 1651   02/15/20 1613  Consult to Palliative Care  Once       Provider:  (Not yet assigned)  Question Answer [place] [facility] Consult   Reason for Consult? family request      02/15/20 1612          Significant initial  Findings: Abnormal Labs Reviewed  LACTIC ACID, PLASMA - Abnormal; Notable for the  following components:      Result Value   Lactic Acid, Venous 3.6 (*)    All other components within normal limits  LACTIC ACID, PLASMA - Abnormal; Notable for the following components:   Lactic Acid, Venous 2.5 (*)    All other components within normal limits  CBC WITH DIFFERENTIAL/PLATELET - Abnormal; Notable for the following components:   HCT 52.6 (*)    MCV 101.9 (*)    Monocytes Absolute 1.3 (*)    Abs Immature Granulocytes 0.33 (*)    All other components within normal limits  FERRITIN - Abnormal; Notable for the following components:   Ferritin 1,614 (*)    All other  components within normal limits  TRIGLYCERIDES - Abnormal; Notable for the following components:   Triglycerides 159 (*)    All other components within normal limits  C-REACTIVE PROTEIN - Abnormal; Notable for the following components:   CRP 7.1 (*)    All other components within normal limits  BRAIN NATRIURETIC PEPTIDE - Abnormal; Notable for the following components:   B Natriuretic Peptide 118.2 (*)    All other components within normal limits  FIBRINOGEN - Abnormal; Notable for the following components:   Fibrinogen 651 (*)    All other components within normal limits  D-DIMER, QUANTITATIVE (NOT AT [facility]) - Abnormal; Notable for the following components:   D-Dimer, Quant 4.97 (*)    All other components within normal limits  COMPREHENSIVE METABOLIC PANEL - Abnormal; Notable for the following components:   Glucose, Bld 114 (*)    BUN 33 (*)    Albumin 3.1 (*)    All other components within normal limits  LACTATE DEHYDROGENASE - Abnormal; Notable for the following components:   LDH 283 (*)    All other components within normal limits  I-STAT ARTERIAL BLOOD GAS, ED - Abnormal; Notable for the following components:   pH, Arterial 7.477 (*)    pCO2 arterial 26.3 (*)    pO2, Arterial 114 (*)    Bicarbonate 19.4 (*)    TCO2 20 (*)    All other components within normal limits    Otherwise labs  showing:    Recent Labs  Lab 02/09/20 0349 02/10/20 0105 02/15/20 1314 02/15/20 1426  NA 137 136 137 136  K 4.1 3.9 3.9 3.9  CO2 22 22  --  23  GLUCOSE 149* 165*  --  114*  BUN 20 23  --  33*  CREATININE 0.87 1.03  --  1.23  CALCIUM 8.7* 8.5*  --  8.9  MG 1.7 1.8  --   --     Cr     Up from baseline see below Lab Results  Component Value Date   CREATININE 1.23 02/15/2020   CREATININE 1.03 02/10/2020   CREATININE 0.87 02/09/2020    Recent Labs  Lab 02/09/20 0349 02/10/20 0105 02/15/20 1426  AST 30 30 31   ALT 14 14 17   ALKPHOS 59 64 72  BILITOT 0.6 0.6 1.2  PROT 6.6 6.3* 7.6  ALBUMIN 2.7* 2.6* 3.1*   Lab Results  Component Value Date   CALCIUM 8.9 02/15/2020     WBC      Component Value Date/Time   WBC 9.2 02/15/2020 1243   LYMPHSABS 0.7 02/15/2020 1243   MONOABS 1.3 (H) 02/15/2020 1243   EOSABS 0.0 02/15/2020 1243   BASOSABS 0.1 02/15/2020 1243    Plt: Lab Results  Component Value Date   PLT 308 02/15/2020    Lactic Acid, Venous    Component Value Date/Time   LATICACIDVEN 2.5 (HH) 02/15/2020 1426    Procalcitonin 0.11   COVID-19 Labs  Recent Labs    02/15/20 1243 02/15/20 1316 02/15/20 1426  DDIMER  --  4.97*  --   FERRITIN 1,614*  --   --   LDH  --   --  283*  CRP 7.1*  --   --     Lab Results  Component Value Date   SARSCOV2NAA POSITIVE (A) 02/07/2020   SARSCOV2NAA NEGATIVE 02/03/2020   SARSCOV2NAA NEGATIVE 12/20/2019   SARSCOV2NAA NEGATIVE 12/14/2019     ABG    Component Value Date/Time   PHART 7.477 (H)  02/15/2020 1314   PCO2ART 26.3 (L) 02/15/2020 1314   PO2ART 114 (H) 02/15/2020 1314   HCO3 19.4 (L) 02/15/2020 1314   TCO2 20 (L) 02/15/2020 1314   ACIDBASEDEF 2.0 02/15/2020 1314   O2SAT 99.0 02/15/2020 1314      HG/HCT  stable,      Component Value Date/Time   HGB 15.3 02/15/2020 1314   HCT 45.0 02/15/2020 1314   MCV 101.9 (H) 02/15/2020 1243       ECG: Ordered Personally reviewed by me showing: HR :  129 Rhythm , Sinus tachycardia    no evidence of ischemic changes QTC 405    BNP (last 3 results) Recent Labs    02/15/20 1243  BNP 118.2*    UA not ordered   Ordered    CXR - NON acute  CTA chest  no PE, bilateral infiltrates 7-9th ribs fracture on the right   RUQ [place] -  NON acute    ED Triage Vitals  Enc Vitals Group     BP 02/15/20 1143 (!) 130/108     Pulse Rate 02/15/20 1143 (!) 126     Resp 02/15/20 1143 (!) 30     Temp 02/15/20 1143 98.7 F (37.1 C)     Temp Source 02/15/20 1854 Oral     SpO2 02/15/20 1143 100 %     Weight --      Height --      Head Circumference --      Peak Flow --      Pain Score 02/15/20 1315 0     Pain Loc --      Pain Edu? --      Excl. in [place]? --   TMAX(24)@       Latest  Blood pressure 121/81, pulse 78, temperature 98.1 F (36.7 C), temperature source Oral, resp. rate (!) 22, SpO2 100 %.     Review of Systems:    Pertinent positives include:  fatigue,   shortness of breath at rest.   dyspnea on exertion  Constitutional:  No weight loss, night sweats, Fevers, chills, weight loss  HEENT:  No headaches, Difficulty swallowing,Tooth/dental problems,Sore throat,  No sneezing, itching, ear ache, nasal congestion, post nasal drip,  Cardio-vascular:  No chest pain, Orthopnea, PND, anasarca, dizziness, palpitations.no Bilateral lower extremity swelling  GI:  No heartburn, indigestion, abdominal pain, nausea, vomiting, diarrhea, change in bowel habits, loss of appetite, melena, blood in stool, hematemesis Resp:  no, No excess mucus, no productive cough, No non-productive cough, No coughing up of blood.No change in color of mucus.No wheezing. Skin:  no rash or lesions. No jaundice GU:  no dysuria, change in color of urine, no urgency or frequency. No straining to urinate.  No flank pain.  Musculoskeletal:  No joint pain or no joint swelling. No decreased range of motion. No back pain.  Psych:  No change in mood or affect. No  depression or anxiety. No memory loss.  Neuro: no localizing neurological complaints, no tingling, no weakness, no double vision, no gait abnormality, no slurred speech, no confusion  All systems reviewed and apart from [place] all are negative  Past Medical History:   Past Medical History:  Diagnosis Date  . Anxiety    takes medication  . Arthritis   . Bipolar 1 disorder ([place])   . Bronchitis   . Cancer ([place])   . Carotid stenosis, left   . GERD (gastroesophageal reflux disease)   . H/O hiatal hernia   .  Hypertension   . Shortness of breath   . Sleep apnea    2 YEARS  [place]   . Stroke [facility])      Past Surgical History:  Procedure Laterality Date  . BIOPSY  12/25/2019   Procedure: BIOPSY;  Surgeon: Rush Landmark Telford Nab., MD;  Location: [place];  Service: Gastroenterology;;  . CHOLECYSTECTOMY    . ENDARTERECTOMY Left 05/03/2017   Procedure: ENDARTERECTOMY CAROTID LEFT;  Surgeon: Rosetta Posner, MD;  Location: [facility] OR;  Service: Vascular;  Laterality: Left;  . ESOPHAGOGASTRODUODENOSCOPY (EGD) WITH PROPOFOL N/A 12/25/2019   Procedure: ESOPHAGOGASTRODUODENOSCOPY (EGD) WITH PROPOFOL;  Surgeon: Irving Copas., MD;  Location: [place];  Service: Gastroenterology;  Laterality: N/A;  . FRACTURE SURGERY     right ankle  . head injury    . KNEE SURGERY     x2  . ORIF SHOULDER FRACTURE  03/03/2011   Procedure: OPEN REDUCTION INTERNAL FIXATION (ORIF) SHOULDER FRACTURE;  Surgeon: Augustin Schooling;  Location: [place];  Service: Orthopedics;  Laterality: Left;  LEFT PROXIMAL HUMERUS Open Reduction internal fixation  . SHOULDER ARTHROSCOPY WITH SUBACROMIAL DECOMPRESSION AND BICEP TENDON REPAIR  02/24/2012   Procedure: SHOULDER ARTHROSCOPY WITH SUBACROMIAL DECOMPRESSION AND BICEP TENDON REPAIR;  Surgeon: Augustin Schooling, MD;  Location: [place];  Service: Orthopedics;  Laterality: Left;  with capsular release and lysis of adhesions  . TONSILLECTOMY      Social History:  Ambulatory    Gilford Rile      reports that he has quit smoking. He smoked 1.50 packs per day. He has never used smokeless tobacco. He reports previous drug use. Drugs: Marijuana and LSD. He reports that he does not drink alcohol.   Family History:   Family History  Problem Relation Age of Onset  . Congestive Heart Failure Mother   . Stroke Mother   . Congestive Heart Failure Father     Allergies: Allergies  Allergen Reactions  . Statins Rash and Other (See Comments)    Bone pain, Myalgias     Prior to Admission medications   Medication Sig Start Date End Date Taking? Authorizing Provider  albuterol (VENTOLIN HFA) 108 (90 Base) MCG/ACT inhaler Inhale 1-2 puffs into the lungs every 6 (six) hours as needed for wheezing or shortness of breath. 02/10/20  Yes Thurnell Lose, MD  Cholecalciferol (VITAMIN D3) 50 MCG (2000 UT) capsule Take 2,000 Units by mouth daily.   Yes [provider]  clopidogrel (PLAVIX) 75 MG tablet Take 1 tablet (75 mg total) by mouth at bedtime. 05/01/17  Yes Vann, Jessica U, DO  gemfibrozil (LOPID) 600 MG tablet Take 600 mg by mouth 2 (two) times daily before a meal.   Yes [provider]  guaiFENesin (MUCINEX) 600 MG 12 hr tablet Take 600 mg by mouth 2 (two) times daily.  08/06/19  Yes [provider]  methylPREDNISolone (MEDROL DOSEPAK) 4 MG TBPK tablet follow package directions 02/10/20  Yes Thurnell Lose, MD  omeprazole (PRILOSEC) 40 MG capsule Take 40 mg by mouth daily.   Yes [provider]  ondansetron (ZOFRAN) 8 MG tablet Take 8 mg by mouth 3 (three) times daily as needed for nausea or vomiting.   Yes [provider]  rosuvastatin (CRESTOR) 40 MG tablet Take 40 mg by mouth daily.   Yes [provider]  thiothixene (NAVANE) 5 MG capsule Take 15 mg by mouth at bedtime. 3 capsules (5mg ) = 15mg  at bedtime   Yes [provider]  Physical Exam: Vitals with BMI 02/15/2020 02/15/2020 02/15/2020  Height - - -   Weight - - -  BMI - - -  Systolic 194 99 174  Diastolic 81 73 73  Pulse 78 79 78    1. General:  in No  Acute distress   Chronically ill  -appearing 2. Psychological: Alert and  Oriented 3. Head/ENT:     Dry Mucous Membranes                          Head Non traumatic, neck supple                           Poor Dentition 4. SKIN  decreased Skin turgor,  Skin clean Dry and intact radiation burn on right upper chest  5. Heart: Regular rate and rhythm no  Murmur, no Rub or gallop 6. Lungs: Clear to auscultation bilaterally, no wheezes or crackles   7. Abdomen: Soft, RUQ tender, Non distended  bowel sounds present 8. Lower extremities: no clubbing, cyanosis, no  edema 9. Neurologically Grossly intact, moving all 4 extremities equally   10. MSK: Normal range of motion   All other LABS:     Recent Labs  Lab 02/09/20 0349 02/10/20 0105 02/15/20 1243 02/15/20 1314  WBC 3.6* 8.0 9.2  --   NEUTROABS 3.0 7.2 6.8  --   HGB 12.0* 11.7* 16.9 15.3  HCT 35.3* 35.0* 52.6* 45.0  MCV 97.5 98.0 101.9*  --   PLT 178 198 308  --      Recent Labs  Lab 02/09/20 0349 02/10/20 0105 02/15/20 1314 02/15/20 1426  NA 137 136 137 136  K 4.1 3.9 3.9 3.9  CL 105 104  --  102  CO2 22 22  --  23  GLUCOSE 149* 165*  --  114*  BUN 20 23  --  33*  CREATININE 0.87 1.03  --  1.23  CALCIUM 8.7* 8.5*  --  8.9  MG 1.7 1.8  --   --      Recent Labs  Lab 02/09/20 0349 02/10/20 0105 02/15/20 1426  AST 30 30 31   ALT 14 14 17   ALKPHOS 59 64 72  BILITOT 0.6 0.6 1.2  PROT 6.6 6.3* 7.6  ALBUMIN 2.7* 2.6* 3.1*       Cultures:    Component Value Date/Time   SDES  02/07/2020 1900    BLOOD RIGHT ANTECUBITAL Performed at [facility], [address], [place], [place] [address]    SDES  02/07/2020 1900    BLOOD BLOOD LEFT FOREARM Performed at [facility], [address], [place], [place] [address]    [facility]  02/07/2020 1900    BOTTLES DRAWN AEROBIC AND ANAEROBIC  Blood Culture adequate volume Performed at [facility], [address] [place], [place] [address]    [facility]  02/07/2020 1900    BOTTLES DRAWN AEROBIC AND ANAEROBIC Blood Culture adequate volume Performed at [facility], [address] [place], [place] [address]    CULT  02/07/2020 1900    NO GROWTH 5 DAYS Performed at [facility], [place] [address], [place], [place] [address]    CULT  02/07/2020 1900    NO GROWTH 5 DAYS Performed at [facility] [address] [place], [place] [address]    REPTSTATUS 02/13/2020 FINAL 02/07/2020  1900   REPTSTATUS 02/13/2020 FINAL 02/07/2020 1900     Radiological Exams on Admission: CT Angio Chest PE W and/or Wo Contrast  Result Date: 02/15/2020 CLINICAL DATA:  Worsening shortness of breath. EXAM: CT ANGIOGRAPHY CHEST WITH CONTRAST TECHNIQUE: Multidetector CT imaging of the chest was performed using the standard protocol during bolus administration of intravenous contrast. Multiplanar CT image reconstructions and MIPs were obtained to evaluate the vascular anatomy. CONTRAST:  75mL OMNIPAQUE IOHEXOL 350 MG/ML SOLN COMPARISON:  CT dated 02/07/2020 FINDINGS: Cardiovascular: Contrast injection is sufficient to demonstrate satisfactory opacification of the pulmonary arteries to the segmental level. There is no pulmonary embolus or evidence of right heart strain. The size of the main pulmonary artery is normal. Normal heart size with coronary artery calcification. The course and caliber of the aorta are normal. There is mild atherosclerotic calcification. Opacification decreased due to pulmonary arterial phase contrast bolus timing. Mediastinum/Nodes: -- No mediastinal lymphadenopathy. -- No hilar lymphadenopathy. -- No axillary lymphadenopathy. -- No supraclavicular lymphadenopathy. -- Normal thyroid gland where visualized. -  Unremarkable esophagus. Lungs/Pleura: Again noted is a cavitary mass in the right lower lobe  similar to prior study. There are persistent ground-glass airspace opacities in the right upper lobe which are essentially stable from prior study. There are worsening peripheral ground-glass airspace opacities in the left lower and left upper lobe. There is no pneumothorax. No large pleural effusion. Upper Abdomen: Contrast bolus timing is not optimized for evaluation of the abdominal organs. The visualized portions of the organs of the upper abdomen are normal. Musculoskeletal: There are subacute right-sided rib fractures involving the seventh through ninth ribs. Review of the MIP images confirms the above findings. IMPRESSION: 1. No evidence for acute pulmonary embolus. 2. Worsening peripheral ground-glass airspace opacities in the left lower and left upper lobe, concerning for pneumonia (viral or bacterial) persistent cavitary mass in the right lower lobe. 3. Subacute right-sided rib fractures involving the seventh through ninth ribs. Aortic Atherosclerosis (ICD10-I70.0). Electronically Signed   By: Constance Holster M.D.   On: 02/15/2020 17:36   DG Chest Port 1 View  Result Date: 02/15/2020 CLINICAL DATA:  Cough, worsening dyspnea. EXAM: PORTABLE CHEST 1 VIEW COMPARISON:  02/07/2020 FINDINGS: Improved aeration at the right lung base compared to the prior examination. Interstitial thickening and chronic changes at the left lung base are unchanged. Heart and mediastinum are grossly stable. IMPRESSION: 1. Improved aeration at the right lung base compared to the recent comparison examination. 2. Chronic changes. Electronically Signed   By: Markus Daft M.D.   On: 02/15/2020 13:16    Chart has been reviewed   Assessment/Plan   74 y.o. male with medical history significant of  stage IIIb adenocarcinoma, COPD, recent COVID -19 infection chronic diastolic CHF, history of CVA history of schizophrenia  Admitted for COVID PNA  Present on Admission: . Pneumonia due to COVID-19 virus -   FROM HOME  WITH  KNOWN HX OF COVID19 ER Novel Corona Virus testing: 02/07/20 positive  Immunization status:not vaccianted    Following concerning LAB/ imaging findings:    CBC: leukopenia, lymphopenia   ANC/ALC ratio>3.5 BMP: increased BUN/Cr     CRP, LDH: increased   IL-6 and Ferritin increased  Procalcitonin: low   CXR: hazy bilateral peripheral opacities or otherwise abnormal     CT chest: GGO,      -Following work-up initiated:      sputum cultures  Ordered 02/15/20, Blood cultures Ordered 02/15/20,   CTA no PE   Following  complications noted:  acute respiratory failure with hypoxia - continue oxygen treatment  Plan of treatment: Admit on [place] Precautions  - patient already finished course of remdesivr and steroids but progressed to hypoxia  Can switch to IV steroids at a higher dose for now given significant hypoxia  -un sure if superinfection with bacteria is contributing Hold off on immuno modulators for now   - Will follow daily d.dimer - Assess for ability to prone  - Supportive management -Fluid sparing resuscitation  -Provide oxygen as needed currently on   SpO2: 100 % O2 Flow Rate (L/min): 10 L/min - IF d.dimer elvated >5 will increase dose of lovenox -Initiate antibiotics  given evidence worrisome for superinfection     Poor Prognostic factors  74 y.o.  Personal hx of COPD, HTN, NON-Vaccinated status Evidence of  organ damage  present  respiratory failure requiring >4L [place]  tachypnea, tachycardia present on admission  ABS neutrophil to lymphocyte ratio >3.5 Significant Risk factors for Cytokine storm CXR GGO Ferritin >250 CRP >4.6 and Lymphopenia  LDH >400         Will order Airborne and Contact precautions  Family/ patient prognosis discussion: I have discussed case with the family/ patient  who are aware of their prognosis At this point they would like to be  DNI but yes to CPR    The treatment plan and use of medications and known side effects were discussed  with patient  It was clearly explained that there is no proven definitive treatment for COVID-19 infection yet. Any medications used here are based on case reports/anecdotal data which are not peer-reviewed and has not been studied using randomized control trials.  Complete risks and long-term side effects are unknown, however in the best clinical judgment they seem to be of some clinical benefit rather than medical risks.  Patient agree with the treatment plan and want to receive these treatments as indicated.     . Acute respiratory failure with hypoxia (HCC)/. Acute respiratory disease due to COVID-19 virus -order continuous pulse ox prone as needed continue oxygen short  . Schizophrenia ([place]) -chronic stable continue home medications  . Hyperlipidemia-chronic stable continue home medications  . COPD (chronic obstructive pulmonary disease) ([place]) -   . Chronic diastolic CHF (congestive heart failure) (HCC) appears to be on a dry side we will gently rehydrate and follow fluid Status   . Closed rib fracture -in the setting of a fall denies head injury supportive management pain management incentive spirometry   Elevated lactic acid.  Improving after IV fluid resuscitation avoid over aggressive fluid resuscitation given Covid infection   History of stroke continue home medications   Other plan as per orders.  DVT prophylaxis:     Lovenox       Code Status:    Code Status: Prior limited DNI   as per patient   I had personally discussed CODE STATUS with patient      Family Communication:   Family not at  Bedside    Disposition Plan:    To home once workup is complete and patient is stable   Following barriers for discharge:                            Hypoxia stable/improving                     transition to PO antibiotics  Will need to be able to tolerate PO              Would benefit from PT/OT eval prior to DC  Ordered                   Swallow  eval - SLP ordered                                     Nutrition    consulted                                   Palliative care    consulted                                 Consults called: none  Admission status:  ED Disposition    ED Disposition Condition [place]: [facility] [100100]  Level of Care: Progressive [102]  Admit to Progressive based on following criteria: RESPIRATORY PROBLEMS hypoxemic/hypercapnic respiratory failure that is responsive to NIPPV (BiPAP) or High Flow Nasal Cannula (6-80 lpm). Frequent assessment/intervention, no > Q2 hrs < Q4 hrs, to maintain oxygenation and pulmonary hygiene.  May admit patient to [facility] or [facility] if equivalent level of care is available:: No  Covid Evaluation: Confirmed COVID Positive  Diagnosis: Acute respiratory failure with hypoxia [facility]) [[id number]]  Admitting Physician: Toy Baker [[id number]]  Attending Physician: Toy Baker [[id number]]  Estimated length of stay: past midnight tomorrow  Certification:: I certify this patient will need inpatient services for at least 2 midnights         inpatient     I Expect 2 midnight stay secondary to severity of patient's current illness need for inpatient interventions justified by the following:  hemodynamic instability despite optimal treatment (tachycardia  Hypoxia )  Severe lab/radiological/exam abnormalities including:    COVID PNA and extensive comorbidities including:    CHF    COPD/asthma     malignancy, .    That are currently affecting medical management.   I expect  patient to be hospitalized for 2 midnights requiring inpatient medical care.  Patient is at high risk for adverse outcome (such as loss of life or disability) if not treated.  Indication for inpatient stay as follows:    Hemodynamic instability despite maximal medical therapy,    New or worsening hypoxia  Need for IV antibiotics, IV fluids      Level of care   progressive   tele indefinitely please discontinue once patient no longer qualifies COVID-19 Labs    Lab Results  Component Value Date   [place] (A) 02/07/2020     Precautions: admitted as   covid positive Airborne and Contact precautions     PPE: Used by the provider:   P100  eye Goggles,  Gloves  gown      Avnoor Koury 02/15/2020, 9:46 PM    [facility]     after 2 AM please page floor coverage PA If 7AM-7PM, please contact the day team taking care of the patient using [email or web address]   Patient was evaluated in the context of the global COVID-19 pandemic, which necessitated consideration that the patient might be at risk for infection with the SARS-CoV-2 virus that causes  COVID-19. Institutional protocols and algorithms that pertain to the evaluation of patients at risk for COVID-19 are in a state of rapid change based on information released by regulatory bodies including the CDC and federal and state organizations. These policies and algorithms were followed during the patient's care.

## 2020-02-15 NOTE — ED Triage Notes (Addendum)
Pt arrives via [facility] for c/o worsening sob after diagnosis of bilateral pna, 10 days post covid. O2 sats with ems around 85% on 15L via [facility], 100% sats upon arrival to ED on NRB. A/ox4. Afebrile.

## 2020-02-16 ENCOUNTER — Encounter (HOSPITAL_COMMUNITY): Payer: Self-pay | Admitting: Internal Medicine

## 2020-02-16 DIAGNOSIS — J9601 Acute respiratory failure with hypoxia: Secondary | ICD-10-CM

## 2020-02-16 DIAGNOSIS — I5032 Chronic diastolic (congestive) heart failure: Secondary | ICD-10-CM

## 2020-02-16 DIAGNOSIS — J1282 Pneumonia due to coronavirus disease 2019: Secondary | ICD-10-CM

## 2020-02-16 DIAGNOSIS — U071 COVID-19: Principal | ICD-10-CM

## 2020-02-16 DIAGNOSIS — J449 Chronic obstructive pulmonary disease, unspecified: Secondary | ICD-10-CM

## 2020-02-16 LAB — C-REACTIVE PROTEIN: CRP: 7.5 mg/dL — ABNORMAL HIGH (ref <1.0)

## 2020-02-16 LAB — COMPREHENSIVE METABOLIC PANEL
ALT: 16 U/L (ref 0–44)
AST: 24 U/L (ref 15–41)
Albumin: 2.6 g/dL — ABNORMAL LOW (ref 3.5–5.0)
Alkaline Phosphatase: 60 U/L (ref 38–126)
Anion gap: 7 (ref 5–15)
BUN: 27 mg/dL — ABNORMAL HIGH (ref 8–23)
CO2: 21 mmol/L — ABNORMAL LOW (ref 22–32)
Calcium: 8 mg/dL — ABNORMAL LOW (ref 8.9–10.3)
Chloride: 104 mmol/L (ref 98–111)
Creatinine, Ser: 0.98 mg/dL (ref 0.61–1.24)
GFR, Estimated: >60 mL/min (ref >60)
Glucose, Bld: 95 mg/dL (ref 70–99)
Potassium: 3.8 mmol/L (ref 3.5–5.1)
Sodium: 132 mmol/L — ABNORMAL LOW (ref 135–145)
Total Bilirubin: 1 mg/dL (ref 0.3–1.2)
Total Protein: 6.3 g/dL — ABNORMAL LOW (ref 6.5–8.1)

## 2020-02-16 LAB — D-DIMER, QUANTITATIVE: D-Dimer, Quant: 2.77 ug/mL-FEU — ABNORMAL HIGH (ref 0.00–0.50)

## 2020-02-16 LAB — MAGNESIUM: Magnesium: 1.9 mg/dL (ref 1.7–2.4)

## 2020-02-16 LAB — FERRITIN: Ferritin: 1293 ng/mL — ABNORMAL HIGH (ref 24–336)

## 2020-02-16 LAB — LACTIC ACID, PLASMA: Lactic Acid, Venous: 1.4 mmol/L (ref 0.5–1.9)

## 2020-02-16 MED ORDER — HYDROCOD POLST-CPM POLST ER 10-8 MG/5ML PO SUER
5.0000 mL | Freq: Two times a day (BID) | ORAL | Status: DC | PRN
  Administered 2020-02-24: 5 mL via ORAL
  Filled 2020-02-16: qty 5

## 2020-02-16 MED ORDER — ORAL CARE MOUTH RINSE
15.0000 mL | Freq: Two times a day (BID) | OROMUCOSAL | Status: DC
  Administered 2020-02-16 – 2020-02-28 (×19): 15 mL via OROMUCOSAL

## 2020-02-16 MED ORDER — BARICITINIB 2 MG PO TABS
4.0000 mg | ORAL_TABLET | Freq: Every day | ORAL | Status: DC
  Administered 2020-02-16 – 2020-02-24 (×9): 4 mg via ORAL
  Filled 2020-02-16 (×9): qty 2

## 2020-02-16 MED ORDER — ALUM & MAG HYDROXIDE-SIMETH 200-200-20 MG/5ML PO SUSP
30.0000 mL | ORAL | Status: DC | PRN
  Administered 2020-02-16: 30 mL via ORAL
  Filled 2020-02-16: qty 30

## 2020-02-16 MED ORDER — BENZONATATE 100 MG PO CAPS
200.0000 mg | ORAL_CAPSULE | Freq: Three times a day (TID) | ORAL | Status: DC
  Administered 2020-02-16 – 2020-02-28 (×33): 200 mg via ORAL
  Filled 2020-02-16 (×32): qty 2

## 2020-02-16 MED ORDER — OXYMETAZOLINE HCL 0.05 % NA SOLN
3.0000 | Freq: Two times a day (BID) | NASAL | Status: AC
  Administered 2020-02-16 – 2020-02-17 (×3): 3 via NASAL
  Filled 2020-02-16: qty 30

## 2020-02-16 MED ORDER — SALINE SPRAY 0.65 % NA SOLN
1.0000 | NASAL | Status: DC | PRN
  Administered 2020-02-16 – 2020-02-27 (×2): 1 via NASAL
  Filled 2020-02-16: qty 44

## 2020-02-16 MED ORDER — METHYLPREDNISOLONE SODIUM SUCC 125 MG IJ SOLR
60.0000 mg | Freq: Two times a day (BID) | INTRAMUSCULAR | Status: DC
  Administered 2020-02-16 – 2020-02-20 (×9): 60 mg via INTRAVENOUS
  Filled 2020-02-16 (×9): qty 2

## 2020-02-16 MED ORDER — LACTATED RINGERS IV SOLN: INTRAVENOUS | Status: DC

## 2020-02-16 MED ORDER — LACTATED RINGERS IV BOLUS
1000.0000 mL | Freq: Once | INTRAVENOUS | Status: AC
  Administered 2020-02-16: 1000 mL via INTRAVENOUS

## 2020-02-16 NOTE — Progress Notes (Signed)
   02/16/20 0000  Assess: MEWS Score  BP (!) 86/57  Pulse Rate 71  ECG Heart Rate 75  Resp (!) 25  SpO2 93 %  O2 Device Nasal Cannula  O2 Flow Rate (L/min) 6 L/min  Assess: MEWS Score  MEWS Temp 0  MEWS Systolic 1  MEWS Pulse 0  MEWS RR 1  MEWS LOC 0  MEWS Score 2  MEWS Score Color Yellow  Assess: if the MEWS score is Yellow or Red  Were vital signs taken at a resting state? Yes  Focused Assessment No change from prior assessment  Early Detection of Sepsis Score *See Row Information* Low  MEWS guidelines implemented *See Row Information* Yes (continue yellow protocol)  Treat  MEWS Interventions Escalated (See documentation below)  Take Vital Signs  Increase Vital Sign Frequency  Yellow: Q 2hr X 2 then Q 4hr X 2, if remains yellow, continue Q 4hrs  Escalate  MEWS: Escalate Yellow: discuss with charge nurse/RN and consider discussing with provider and RRT  Notify: Charge Nurse/RN  Name of Charge Nurse/RN Notified self  Date Charge Nurse/RN Notified 02/15/20  Time Charge Nurse/RN Notified 0023  Notify: Provider  Provider Name/Title Dr. Tonie Griffith  Date Provider Notified 02/16/20  Time Provider Notified 0024  Notification Type Page  Notification Reason Change in status  Response See new orders  Date of Provider Response 02/16/20  Time of Provider Response 0027  Notify: Rapid Response  Name of Rapid Response RN Notified  (N/A)

## 2020-02-16 NOTE — Progress Notes (Signed)
   Palliative Medicine Inpatient Follow Up Note  I reached out to Dr. Sloan Leiter to see how we could be of support for patient Melvin Foley.   He stated that he, at this time is not in need of Palliative consultation.   We will sign off at this time but if any needs should arise please do not hesitate to reach out.  No Charge ______________________________________________________________________________________ [place] Team Team Cell Phone: [phone] Please utilize secure chat with additional questions, if there is no response within 30 minutes please call the above phone number  Palliative Medicine Team providers are available by phone from 7am to 7pm daily and can be reached through the team cell phone.  Should this patient require assistance outside of these hours, please call the patient's attending physician.

## 2020-02-16 NOTE — Plan of Care (Signed)
Patient currently resting in bed. Denies pain. BP low, given bolus, MIVF started. Patient remains on 6L [place]. Call bell within reach. Bed alarm on.   Problem: Education: Goal: Knowledge of General Education information will improve Description: Including pain rating scale, medication(s)/side effects and non-pharmacologic comfort measures Outcome: Progressing   Problem: Health Behavior/Discharge Planning: Goal: Ability to manage health-related needs will improve Outcome: Progressing   Problem: Clinical Measurements: Goal: Ability to maintain clinical measurements within normal limits will improve Outcome: Progressing Goal: Will remain free from infection Outcome: Progressing Goal: Diagnostic test results will improve Outcome: Progressing Goal: Respiratory complications will improve Outcome: Progressing Goal: Cardiovascular complication will be avoided Outcome: Progressing   Problem: Activity: Goal: Risk for activity intolerance will decrease Outcome: Progressing   Problem: Nutrition: Goal: Adequate nutrition will be maintained Outcome: Progressing   Problem: Coping: Goal: Level of anxiety will decrease Outcome: Progressing   Problem: Elimination: Goal: Will not experience complications related to bowel motility Outcome: Progressing Goal: Will not experience complications related to urinary retention Outcome: Progressing   Problem: Pain Managment: Goal: General experience of comfort will improve Outcome: Progressing   Problem: Safety: Goal: Ability to remain free from injury will improve Outcome: Progressing   Problem: Skin Integrity: Goal: Risk for impaired skin integrity will decrease Outcome: Progressing

## 2020-02-16 NOTE — Progress Notes (Signed)
PROGRESS NOTE                                                                                                                                                                                                             Patient Demographics:    Melvin Foley, is a 74 y.o. male, DOB - 05-06-1945, CBU:[id number]  Outpatient Primary MD for the patient is Wallie Char, FNP   Admit date - 02/15/2020   LOS - 1  Chief Complaint  Patient presents with  . Covid Positive  . Pneumonia       Brief Narrative: Patient is a 74 y.o. male with PMHx of stage IIIb adenocarcinoma-finished radiation approximately 6 weeks back (follows at [facility])-recently hospitalized from 10/22-10/25 COVID-19 pneumonia-did not require O2 on discharge-presented back to the ED on 10/30 with shortness of breath-found to have severe hypoxic respiratory failure due to ongoing COVID-19 pneumonitis.  COVID-19 vaccinated status: Unvaccinated   Significant Events: 10/18>> evaluated at [facility] for hypotension/possible pneumonia-refused admission-Covid 19 negative 10/22-10/25>> hospitalization for Covid-discharged on room air. 10/30>> readmitted with severe hypoxemia-due to ongoing COVID-19 pneumonitis.  Significant studies: 10/22>>Chest x-ray: Patchy right mid/lower lung opacities concerning for pneumonia 10/22>> CTA chest: New airspace disease in the right upper/right lower lobe/left lower lobe-rounded area of masslike consolidation in the right lower lobe with cavitation again noted. 10/31>> CTA chest: No PE, worsening peripheral groundglass opacities, subacute right sided rib fractures along the seventh through ninth ribs.  COVID-19 medications: Steroids: 10/22>> Remdesivir: 10/22>>10/26 Baricitinib: 10/31>>  Antibiotics: Vancomycin: 10/30>> 10/31 Cefepime: 10/30>> 10/31  Microbiology data: 10/22 >>blood culture: No growth 10/30>>  blood culture: No growth  Procedures: None  Consults: None  DVT prophylaxis: enoxaparin (LOVENOX) injection 40 mg Start: 02/15/20 2300    Subjective:   Short of breath with minimal activity-on 12 L of HFNC. Appears comfortable at rest.   Assessment  & Plan :   Acute hypoxic respiratory failure secondary to Covid 19 Viral pneumonia: Has severe hypoxemia-completed Remdesivir recently-on steroids-add baricitinib. No evidence of volume overload-does not require diuretics at this point. Doubt bacterial pneumonia-okay to stop all antimicrobial therapy.  Note-rationale/risk/benefits of baricitinib discussed-he consents to the use of these agents if his clinical condition worsens with severe hypoxemia.  He has no history of TB, hepatitis B or diverticulitis.  Fever: afebrile O2 requirements:  SpO2: 97 % O2 Flow Rate (L/min): 6 L/min   COVID-19 Labs: Recent Labs    02/15/20 1243 02/15/20 1316 02/15/20 1426 02/15/20 2321  DDIMER  --  4.97*  --  2.77*  FERRITIN 1,614*  --   --  1,293*  LDH  --   --  283*  --   CRP 7.1*  --   --  7.5*       Component Value Date/Time   BNP 118.2 (H) 02/15/2020 1243    Recent Labs  Lab 02/15/20 1426  PROCALCITON 0.11    Lab Results  Component Value Date   SARSCOV2NAA POSITIVE (A) 02/07/2020   SARSCOV2NAA NEGATIVE 02/03/2020   [place] NEGATIVE 12/20/2019   [place] NEGATIVE 12/14/2019     Prone/Incentive Spirometry: encouraged  incentive spirometry use 3-4/hour.   COPD: No evidence of flare-continue bronchodilators  Chronic diastolic heart failure: Euvolemic-follow volume status closely.  History of prior CVA/carotid endarterectomy: Continue Plavix.  Stage IIIb adenocarcinoma of the lung: Claims recently completed RTX at [facility] 6 weeks back-before that he was on chemo at the [facility] hospital.  CT chest continues to show right lower lobe mass.  Follow with oncology post discharge.  Incidental finding of rib  fractures: Supportive care. Patient denies any recent trauma.  Schizophrenia: Appears stable-continue thiothixene   Palliative care/goals of care discussion: Understands that he is severely weak with extensive parenchymal lung injury due to COVID-19 pneumonia-he reconfirms that he does not want to be intubated in case he worsens-but does want to pursue CPR for arrhythmias etc. He understands that he remains at significant risk for further worsening in spite of aggressive medical care-given his underlying comorbidities.  Spouse aware of tenuous clinical situation as well.   ABG:    Component Value Date/Time   PHART 7.477 (H) 02/15/2020 1314   PCO2ART 26.3 (L) 02/15/2020 1314   PO2ART 114 (H) 02/15/2020 1314   HCO3 19.4 (L) 02/15/2020 1314   TCO2 20 (L) 02/15/2020 1314   ACIDBASEDEF 2.0 02/15/2020 1314   O2SAT 99.0 02/15/2020 1314    Vent Settings: N/A    Condition -Guarded  Family Communication  : Spouse-[phone]-on 10/31  Code Status :  DNI  Diet :  Diet Order            Diet Heart Room service appropriate? Yes; Fluid consistency: Thin  Diet effective now                  Disposition Plan  :   Status is: Inpatient  Remains inpatient appropriate because:Inpatient level of care appropriate due to severity of illness  Dispo: The patient is from: Home              Anticipated d/c is to: Home              Anticipated d/c date is: > 3 days              Patient currently is not medically stable to d/c.   Barriers to discharge: Hypoxia requiring O2 supplementation  Antimicorbials  :    Anti-infectives (From admission, onward)   Start     Dose/Rate Route Frequency Ordered Stop   02/16/20 1000  ceFEPIme (MAXIPIME) 2 g in sodium chloride 0.9 % 100 mL IVPB        2 g 200 mL/hr over 30 Minutes Intravenous Every 12 hours 02/15/20 1951     02/16/20 0800  vancomycin (VANCOREADY) IVPB 500 mg/100 mL  500 mg 100 mL/hr over 60 Minutes Intravenous Every 12 hours  02/15/20 1844     02/15/20 1845  ceFEPIme (MAXIPIME) 2 g in sodium chloride 0.9 % 100 mL IVPB        2 g 200 mL/hr over 30 Minutes Intravenous  Once 02/15/20 1839 02/15/20 1949   02/15/20 1845  vancomycin (VANCOREADY) IVPB 1500 mg/300 mL        1,500 mg 150 mL/hr over 120 Minutes Intravenous  Once 02/15/20 1844 02/15/20 2152      Inpatient Medications  Scheduled Meds: . vitamin C  500 mg Oral Daily  . benzonatate  200 mg Oral TID  . clopidogrel  75 mg Oral QHS  . enoxaparin (LOVENOX) injection  40 mg Subcutaneous Q24H  . gemfibrozil  600 mg Oral BID AC  . guaiFENesin  600 mg Oral BID  . mouth rinse  15 mL Mouth Rinse BID  . methylPREDNISolone (SOLU-MEDROL) injection  60 mg Intravenous Q12H  . oxymetazoline  3 spray Each Nare BID  . pantoprazole  40 mg Oral Daily  . rosuvastatin  40 mg Oral Daily  . sodium chloride flush  3 mL Intravenous Q12H  . thiothixene  15 mg Oral QHS  . zinc sulfate  220 mg Oral Daily   Continuous Infusions: . ceFEPime (MAXIPIME) IV 2 g (02/16/20 1034)  . lactated ringers 75 mL/hr at 02/16/20 0243  . vancomycin 500 mg (02/16/20 1035)   PRN Meds:.acetaminophen, albuterol, chlorpheniramine-HYDROcodone, HYDROcodone-acetaminophen, morphine injection, ondansetron **OR** ondansetron (ZOFRAN) IV, sodium chloride   Time Spent in minutes  35  See all Orders from today for further details   Oren Binet M.D on 02/16/2020 at 10:37 AM  To page go to [email or web address] - use universal password  [facility] -  Office  [phone]    Objective:   Vitals:   02/16/20 0147 02/16/20 0200 02/16/20 0408 02/16/20 0725  BP: 97/67 103/78 101/69 (!) 129/99  Pulse: 78 75 68 74  Resp: (!) 22 (!) 23 20 (!) 23  Temp: 97.9 F (36.6 C)  97.8 F (36.6 C) 98.7 F (37.1 C)  TempSrc: Axillary  Axillary Axillary  SpO2: 97% 98% 95% 97%    Wt Readings from Last 3 Encounters:  02/08/20 67.1 kg  02/03/20 69.7 kg  12/20/19 68.9 kg     Intake/Output Summary  (Last 24 hours) at 02/16/2020 1037 Last data filed at 02/16/2020 0900 Gross per 24 hour  Intake 1240 ml  Output 550 ml  Net 690 ml     Physical Exam Gen Exam:Alert awake-not in any distress HEENT:atraumatic, normocephalic Chest: B/L clear to auscultation anteriorly CVS:S1S2 regular Abdomen:soft non tender, non distended Extremities:no edema Neurology: Non focal Skin: no rash   Data Review:    CBC Recent Labs  Lab 02/10/20 0105 02/15/20 1243 02/15/20 1314 02/15/20 2321  WBC 8.0 9.2  --  5.6  HGB 11.7* 16.9 15.3 12.9*  HCT 35.0* 52.6* 45.0 39.7  PLT 198 308  --  188  MCV 98.0 101.9*  --  101.5*  MCH 32.8 32.8  --  33.0  MCHC 33.4 32.1  --  32.5  RDW 13.8 14.4  --  14.4  LYMPHSABS 0.3* 0.7  --  0.3*  MONOABS 0.4 1.3*  --  0.7  EOSABS 0.0 0.0  --  0.0  BASOSABS 0.0 0.1  --  0.0    Chemistries  Recent Labs  Lab 02/10/20 0105 02/15/20 1314 02/15/20 1426 02/15/20 2321  NA 136 137 136 132*  K 3.9 3.9 3.9 3.8  CL 104  --  102 104  CO2 22  --  23 21*  GLUCOSE 165*  --  114* 95  BUN 23  --  33* 27*  CREATININE 1.03  --  1.23 0.98  CALCIUM 8.5*  --  8.9 8.0*  MG 1.8  --   --  1.9  AST 30  --  31 24  ALT 14  --  17 16  ALKPHOS 64  --  72 60  BILITOT 0.6  --  1.2 1.0   ------------------------------------------------------------------------------------------------------------------ Recent Labs    02/15/20 1243  TRIG 159*    Lab Results  Component Value Date   HGBA1C 5.1 05/01/2017   ------------------------------------------------------------------------------------------------------------------ No results for input(s): TSH, T4TOTAL, T3FREE, THYROIDAB in the last 72 hours.  Invalid input(s): FREET3 ------------------------------------------------------------------------------------------------------------------ Recent Labs    02/15/20 1243 02/15/20 2321  FERRITIN 1,614* 1,293*    Coagulation profile No results for input(s): INR, PROTIME in the  last 168 hours.  Recent Labs    02/15/20 1316 02/15/20 2321  DDIMER 4.97* 2.77*    Cardiac Enzymes No results for input(s): CKMB, TROPONINI, MYOGLOBIN in the last 168 hours.  Invalid input(s): CK ------------------------------------------------------------------------------------------------------------------    Component Value Date/Time   BNP 118.2 (H) 02/15/2020 1243    Micro Results Recent Results (from the past 240 hour(s))  Respiratory Panel by RT PCR (Flu A&B, Covid) - Nasopharyngeal Swab     Status: Abnormal   Collection Time: 02/07/20  6:59 PM   Specimen: Nasopharyngeal Swab  Result Value Ref Range Status   SARS Coronavirus 2 by RT PCR POSITIVE (A) NEGATIVE Final    Comment: RESULT CALLED TO, READ BACK BY AND VERIFIED WITH: NEAL KELLIE RN AT 2040 ON 02/07/20 BY I.[email or web address] (NOTE) SARS-CoV-2 target nucleic acids are DETECTED.  SARS-CoV-2 RNA is generally detectable in upper respiratory specimens  during the acute phase of infection. Positive results are indicative of the presence of the identified virus, but do not rule out bacterial infection or co-infection with other pathogens not detected by the test. Clinical correlation with patient history and other diagnostic information is necessary to determine patient infection status. The expected result is Negative.  Fact Sheet for Patients:  PinkCheek.be  Fact Sheet for Healthcare Providers: GravelBags.it  This test is not yet approved or cleared by the [place] FDA and  has been authorized for detection and/or diagnosis of SARS-CoV-2 by FDA under an Emergency Use Authorization (EUA).  This EUA will remain in effect (meaning this t est can be used) for the duration of  the COVID-19 declaration under Section 564(b)(1) of the Act, 21 U.S.C. section 360bbb-3(b)(1), unless the authorization is terminated or revoked sooner.      Influenza A by PCR NEGATIVE  NEGATIVE Final   Influenza B by PCR NEGATIVE NEGATIVE Final    Comment: (NOTE) The Xpert Xpress SARS-CoV-2/FLU/RSV assay is intended as an aid in  the diagnosis of influenza from Nasopharyngeal swab specimens and  should not be used as a sole basis for treatment. Nasal washings and  aspirates are unacceptable for Xpert Xpress SARS-CoV-2/FLU/RSV  testing.  Fact Sheet for Patients: PinkCheek.be  Fact Sheet for Healthcare Providers: GravelBags.it  This test is not yet approved or cleared by the [place] FDA and  has been authorized for detection and/or diagnosis of SARS-CoV-2 by  FDA under an Emergency Use Authorization (EUA). This EUA will remain  in effect (meaning this test can be used) for the duration  of the  Covid-19 declaration under Section 564(b)(1) of the Act, 21  U.S.C. section 360bbb-3(b)(1), unless the authorization is  terminated or revoked. Performed at [facility], [address] [place], [place] [address]   Culture, blood (routine x 2)     Status: None   Collection Time: 02/07/20  7:00 PM   Specimen: BLOOD  Result Value Ref Range Status   Specimen Description   Final    BLOOD RIGHT ANTECUBITAL Performed at [facility], [address] [place], [place] [address]    Special Requests   Final    BOTTLES DRAWN AEROBIC AND ANAEROBIC Blood Culture adequate volume Performed at [facility], [address] [place], [place] [address]    Culture   Final    NO GROWTH 5 DAYS Performed at [facility], [place] [address] [place], [place] [address]    Report Status 02/13/2020 FINAL  Final  Culture, blood (routine x 2)     Status: None   Collection Time: 02/07/20  7:00 PM   Specimen: BLOOD  Result Value Ref Range Status   Specimen Description   Final    BLOOD BLOOD LEFT FOREARM Performed at [facility], [address] [place], [place] [address]    Special  Requests   Final    BOTTLES DRAWN AEROBIC AND ANAEROBIC Blood Culture adequate volume Performed at [facility], [address] [place], [place] [address]    Culture   Final    NO GROWTH 5 DAYS Performed at [facility], [place] [address] [place], [place] [address]    Report Status 02/13/2020 FINAL  Final  Blood Culture (routine x 2)     Status: None (Preliminary result)   Collection Time: 02/15/20 12:45 PM   Specimen: BLOOD LEFT FOREARM  Result Value Ref Range Status   Specimen Description BLOOD LEFT FOREARM  Final   Special Requests   Final    BOTTLES DRAWN AEROBIC AND ANAEROBIC Blood Culture results may not be optimal due to an inadequate volume of blood received in culture bottles   Culture   Final    NO GROWTH < 24 HOURS Performed at [facility], [place] [address] [place], [place] [address]    Report Status PENDING  Incomplete  Blood Culture (routine x 2)     Status: None (Preliminary result)   Collection Time: 02/15/20  1:16 PM   Specimen: BLOOD  Result Value Ref Range Status   Specimen Description BLOOD RIGHT ANTECUBITAL  Final   Special Requests   Final    BOTTLES DRAWN AEROBIC AND ANAEROBIC Blood Culture results may not be optimal due to an inadequate volume of blood received in culture bottles   Culture   Final    NO GROWTH < 24 HOURS Performed at [facility], [place] [address], [place],  [address]    Report Status PENDING  Incomplete  MRSA PCR Screening     Status: None   Collection Time: 02/15/20  8:12 PM   Specimen: Nasal Mucosa; Nasopharyngeal  Result Value Ref Range Status   MRSA by PCR NEGATIVE NEGATIVE Final    Comment:        The GeneXpert MRSA Assay (FDA approved for NASAL specimens only), is one component of a comprehensive MRSA colonization surveillance program. It is not intended to diagnose MRSA infection nor to guide or monitor treatment for MRSA infections.  Performed at [facility], [place] [address],  [place], [place] [address]     Radiology Reports DG Chest 2 View  Result Date: 02/03/2020 CLINICAL DATA:  Hypotension, shortness of breath EXAM: CHEST - 2 VIEW COMPARISON:  12/20/2019 FINDINGS: Patchy airspace disease in the lower lobes concerning for pneumonia. Heart is normal size. No effusions or pneumothorax. No acute bony abnormality. IMPRESSION: Patchy bilateral lower lobe airspace opacities concerning for pneumonia. Electronically Signed   By: Rolm Baptise M.D.   On: 02/03/2020 17:42   CT Angio Chest PE W and/or Wo Contrast  Result Date: 02/15/2020 CLINICAL DATA:  Worsening shortness of breath. EXAM: CT ANGIOGRAPHY CHEST WITH CONTRAST TECHNIQUE: Multidetector CT imaging of the chest was performed using the standard protocol during bolus administration of intravenous contrast. Multiplanar CT image reconstructions and MIPs were obtained to evaluate the vascular anatomy. CONTRAST:  57mL OMNIPAQUE IOHEXOL 350 MG/ML SOLN COMPARISON:  CT dated 02/07/2020 FINDINGS: Cardiovascular: Contrast injection is sufficient to demonstrate satisfactory opacification of the pulmonary arteries to the segmental level. There is no pulmonary embolus or evidence of right heart strain. The size of the main pulmonary artery is normal. Normal heart size with coronary artery calcification. The course and caliber of the aorta are normal. There is mild atherosclerotic calcification. Opacification decreased due to pulmonary arterial phase contrast bolus timing. Mediastinum/Nodes: -- No mediastinal lymphadenopathy. -- No hilar lymphadenopathy. -- No axillary lymphadenopathy. -- No supraclavicular lymphadenopathy. -- Normal thyroid gland where visualized. -  Unremarkable esophagus. Lungs/Pleura: Again noted is a cavitary mass in the right lower lobe similar to prior study. There are persistent ground-glass airspace opacities in the right upper lobe which are essentially stable from prior study. There are worsening peripheral  ground-glass airspace opacities in the left lower and left upper lobe. There is no pneumothorax. No large pleural effusion. Upper Abdomen: Contrast bolus timing is not optimized for evaluation of the abdominal organs. The visualized portions of the organs of the upper abdomen are normal. Musculoskeletal: There are subacute right-sided rib fractures involving the seventh through ninth ribs. Review of the MIP images confirms the above findings. IMPRESSION: 1. No evidence for acute pulmonary embolus. 2. Worsening peripheral ground-glass airspace opacities in the left lower and left upper lobe, concerning for pneumonia (viral or bacterial) persistent cavitary mass in the right lower lobe. 3. Subacute right-sided rib fractures involving the seventh through ninth ribs. Aortic Atherosclerosis (ICD10-I70.0). Electronically Signed   By: Constance Holster M.D.   On: 02/15/2020 17:36   CT Angio Chest PE W and/or Wo Contrast  Result Date: 02/07/2020 CLINICAL DATA:  Positive D-dimer, shortness of breath, tachycardia. EXAM: CT ANGIOGRAPHY CHEST WITH CONTRAST TECHNIQUE: Multidetector CT imaging of the chest was performed using the standard protocol during bolus administration of intravenous contrast. Multiplanar CT image reconstructions and MIPs were obtained to evaluate the vascular anatomy. CONTRAST:  145mL OMNIPAQUE IOHEXOL 350 MG/ML SOLN COMPARISON:  12/20/2019 FINDINGS: Cardiovascular: No filling defects in the pulmonary arteries to suggest pulmonary emboli. Heart is normal size. Aortic and coronary artery calcifications. No aneurysm. Mediastinum/Nodes: Stable borderline sized right hilar lymph node. Scattered small and borderline sized mediastinal lymph nodes, stable. Trachea and esophagus are unremarkable. Thyroid unremarkable. Lungs/Pleura: Trace right pleural effusion. Masslike consolidation in the right lower lobe is again noted and is stable since prior study. New rounded masslike area of consolidation in the  right upper lobe measuring 2.5 cm. Increasing airspace disease posteriorly and peripherally in the right upper lobe and superior segment of the  right lower lobe. Increasing ground-glass airspace opacity posteriorly in the superior segment of the left lower lobe. Paraseptal emphysema. Peripheral reticulation in the lungs bilaterally compatible with fibrosis. None none Upper Abdomen: Imaging into the upper abdomen demonstrates no acute findings. Musculoskeletal: Chest wall soft tissues are unremarkable. No acute bony abnormality. Review of the MIP images confirms the above findings. IMPRESSION: Rounded area of masslike consolidation in the right lower lobe with cavitation again noted, unchanged. New airspace disease seen in the right upper lobe and right lower lobe as well as superior segment of the left lower lobe. These areas are concerning for pneumonia. Areas of peripheral interstitial prominence and fibrosis bilaterally. Right hilar and borderline mediastinal lymph nodes are stable. Coronary artery disease. Aortic Atherosclerosis (ICD10-I70.0). Electronically Signed   By: Rolm Baptise M.D.   On: 02/07/2020 20:24   DG Chest Port 1 View  Result Date: 02/15/2020 CLINICAL DATA:  Cough, worsening dyspnea. EXAM: PORTABLE CHEST 1 VIEW COMPARISON:  02/07/2020 FINDINGS: Improved aeration at the right lung base compared to the prior examination. Interstitial thickening and chronic changes at the left lung base are unchanged. Heart and mediastinum are grossly stable. IMPRESSION: 1. Improved aeration at the right lung base compared to the recent comparison examination. 2. Chronic changes. Electronically Signed   By: Markus Daft M.D.   On: 02/15/2020 13:16   DG Chest Portable 1 View  Result Date: 02/07/2020 CLINICAL DATA:  Shortness of breath, cough, congestion EXAM: PORTABLE CHEST 1 VIEW COMPARISON:  02/03/2020 FINDINGS: Airspace disease in the right mid and lower lung again noted, most confluent at the right lung  base compatible with pneumonia. Heart is normal size. Left lung clear. Previously seen patchy opacities at the left base have resolved. No effusions or acute bony abnormality. IMPRESSION: Continued patchy right mid and lower lung airspace opacities concerning for pneumonia. Clearing of the left basilar opacities since prior study. Electronically Signed   By: Rolm Baptise M.D.   On: 02/07/2020 18:51   VAS [place] LOWER EXTREMITY VENOUS (DVT)  Result Date: 02/10/2020  Lower Venous DVT Study Indications: Elevated Ddimer.  Risk Factors: COVID 19 positive. Comparison Study: No prior studies. Performing Technologist: Oliver Hum RVT  Examination Guidelines: A complete evaluation includes B-mode imaging, spectral Doppler, color Doppler, and power Doppler as needed of all accessible portions of each vessel. Bilateral testing is considered an integral part of a complete examination. Limited examinations for reoccurring indications may be performed as noted. The reflux portion of the exam is performed with the patient in reverse Trendelenburg.  +---------+---------------+---------+-----------+----------+--------------+ RIGHT    CompressibilityPhasicitySpontaneityPropertiesThrombus Aging +---------+---------------+---------+-----------+----------+--------------+ CFV      Full           Yes      Yes                                 +---------+---------------+---------+-----------+----------+--------------+ SFJ      Full                                                        +---------+---------------+---------+-----------+----------+--------------+ FV Prox  Full                                                        +---------+---------------+---------+-----------+----------+--------------+  FV Mid   Full                                                        +---------+---------------+---------+-----------+----------+--------------+ FV DistalFull                                                         +---------+---------------+---------+-----------+----------+--------------+ PFV      Full                                                        +---------+---------------+---------+-----------+----------+--------------+ POP      Full           Yes      Yes                                 +---------+---------------+---------+-----------+----------+--------------+ PTV      Full                                                        +---------+---------------+---------+-----------+----------+--------------+ PERO     Full                                                        +---------+---------------+---------+-----------+----------+--------------+   +---------+---------------+---------+-----------+----------+--------------+ LEFT     CompressibilityPhasicitySpontaneityPropertiesThrombus Aging +---------+---------------+---------+-----------+----------+--------------+ CFV      Full           Yes      Yes                                 +---------+---------------+---------+-----------+----------+--------------+ SFJ      Full                                                        +---------+---------------+---------+-----------+----------+--------------+ FV Prox  Full                                                        +---------+---------------+---------+-----------+----------+--------------+ FV Mid   Full                                                        +---------+---------------+---------+-----------+----------+--------------+  FV DistalFull                                                        +---------+---------------+---------+-----------+----------+--------------+ PFV      Full                                                        +---------+---------------+---------+-----------+----------+--------------+ POP      Full           Yes      Yes                                  +---------+---------------+---------+-----------+----------+--------------+ PTV      Full                                                        +---------+---------------+---------+-----------+----------+--------------+ PERO     Full                                                        +---------+---------------+---------+-----------+----------+--------------+     Summary: RIGHT: - There is no evidence of deep vein thrombosis in the lower extremity.  - No cystic structure found in the popliteal fossa.  LEFT: - There is no evidence of deep vein thrombosis in the lower extremity.  - No cystic structure found in the popliteal fossa.  *See table(s) above for measurements and observations. Electronically signed by Ruta Hinds MD on 02/10/2020 at 6:25:28 PM.    Final    US Abdomen Limited RUQ (LIVER/GB)  Result Date: 02/15/2020 CLINICAL DATA:  Right upper quadrant abdominal pain. History of prior cholecystectomy. EXAM: ULTRASOUND ABDOMEN LIMITED RIGHT UPPER QUADRANT COMPARISON:  None. FINDINGS: Gallbladder: The gallbladder is surgically absent. Common bile duct: Diameter: 4.9 mm Liver: No focal lesion identified. Within normal limits in parenchymal echogenicity. Portal vein is patent on color Doppler imaging with normal direction of blood flow towards the liver. Other: The study is limited secondary to overlying bowel gas. IMPRESSION: 1. Findings consistent with prior cholecystectomy. 2. Otherwise, unremarkable right upper quadrant ultrasound. Electronically Signed   By: Virgina Norfolk M.D.   On: 02/15/2020 21:02

## 2020-02-17 LAB — FERRITIN: Ferritin: 1329 ng/mL — ABNORMAL HIGH (ref 24–336)

## 2020-02-17 LAB — CBC WITH DIFFERENTIAL/PLATELET
Abs Immature Granulocytes: 0.07 10*3/uL (ref 0.00–0.07)
Basophils Absolute: 0 10*3/uL (ref 0.0–0.1)
Basophils Relative: 0 %
Eosinophils Absolute: 0 10*3/uL (ref 0.0–0.5)
Eosinophils Relative: 0 %
HCT: 35.5 % — ABNORMAL LOW (ref 39.0–52.0)
Hemoglobin: 11.7 g/dL — ABNORMAL LOW (ref 13.0–17.0)
Immature Granulocytes: 1 %
Lymphocytes Relative: 6 %
Lymphs Abs: 0.3 10*3/uL — ABNORMAL LOW (ref 0.7–4.0)
MCH: 32.8 pg (ref 26.0–34.0)
MCHC: 33 g/dL (ref 30.0–36.0)
MCV: 99.4 fL (ref 80.0–100.0)
Monocytes Absolute: 0.2 10*3/uL (ref 0.1–1.0)
Monocytes Relative: 4 %
Neutro Abs: 4.6 10*3/uL (ref 1.7–7.7)
Neutrophils Relative %: 89 %
Platelets: 188 10*3/uL (ref 150–400)
RBC: 3.57 MIL/uL — ABNORMAL LOW (ref 4.22–5.81)
RDW: 13.8 % (ref 11.5–15.5)
WBC: 5.2 10*3/uL (ref 4.0–10.5)
nRBC: 0 % (ref 0.0–0.2)

## 2020-02-17 LAB — D-DIMER, QUANTITATIVE: D-Dimer, Quant: 1.61 ug/mL-FEU — ABNORMAL HIGH (ref 0.00–0.50)

## 2020-02-17 LAB — COMPREHENSIVE METABOLIC PANEL
ALT: 14 U/L (ref 0–44)
AST: 23 U/L (ref 15–41)
Albumin: 2.2 g/dL — ABNORMAL LOW (ref 3.5–5.0)
Alkaline Phosphatase: 54 U/L (ref 38–126)
Anion gap: 9 (ref 5–15)
BUN: 22 mg/dL (ref 8–23)
CO2: 21 mmol/L — ABNORMAL LOW (ref 22–32)
Calcium: 8.3 mg/dL — ABNORMAL LOW (ref 8.9–10.3)
Chloride: 106 mmol/L (ref 98–111)
Creatinine, Ser: 0.91 mg/dL (ref 0.61–1.24)
GFR, Estimated: >60 mL/min (ref >60)
Glucose, Bld: 169 mg/dL — ABNORMAL HIGH (ref 70–99)
Potassium: 4.2 mmol/L (ref 3.5–5.1)
Sodium: 136 mmol/L (ref 135–145)
Total Bilirubin: 0.8 mg/dL (ref 0.3–1.2)
Total Protein: 5.9 g/dL — ABNORMAL LOW (ref 6.5–8.1)

## 2020-02-17 LAB — C-REACTIVE PROTEIN: CRP: 6.2 mg/dL — ABNORMAL HIGH (ref <1.0)

## 2020-02-17 LAB — GLUCOSE, CAPILLARY
Glucose-Capillary: 197 mg/dL — ABNORMAL HIGH (ref 70–99)
Glucose-Capillary: 183 mg/dL — ABNORMAL HIGH (ref 70–99)

## 2020-02-17 LAB — MAGNESIUM: Magnesium: 2.1 mg/dL (ref 1.7–2.4)

## 2020-02-17 MED ORDER — ENSURE ENLIVE PO LIQD
237.0000 mL | Freq: Two times a day (BID) | ORAL | Status: DC
  Administered 2020-02-17 – 2020-02-18 (×3): 237 mL via ORAL

## 2020-02-17 NOTE — Progress Notes (Signed)
PROGRESS NOTE                                                                                                                                                                                                             Patient Demographics:    Melvin Foley, is a 74 y.o. male, DOB - 08-07-1945, TUU:[id number]  Outpatient Primary MD for the patient is Wallie Char, FNP   Admit date - 02/15/2020   LOS - 2  Chief Complaint  Patient presents with  . Covid Positive  . Pneumonia       Brief Narrative: Patient is a 74 y.o. male with PMHx of stage IIIb adenocarcinoma-finished radiation approximately 6 weeks back (follows at [place] system)-recently hospitalized from 10/22-10/25 COVID-19 pneumonia-did not require O2 on discharge-presented back to the ED on 10/30 with shortness of breath-found to have severe hypoxic respiratory failure due to ongoing COVID-19 pneumonitis.  COVID-19 vaccinated status: Unvaccinated   Significant Events: 10/18>> evaluated at [facility] for hypotension/possible pneumonia-refused admission-Covid 19 negative 10/22-10/25>> hospitalization for Covid-discharged on room air. 10/30>> readmitted with severe hypoxemia-due to ongoing COVID-19 pneumonitis.  Significant studies: 10/22>>Chest x-ray: Patchy right mid/lower lung opacities concerning for pneumonia 10/22>> CTA chest: New airspace disease in the right upper/right lower lobe/left lower lobe-rounded area of masslike consolidation in the right lower lobe with cavitation again noted. 10/31>> CTA chest: No PE, worsening peripheral groundglass opacities, subacute right sided rib fractures along the seventh through ninth ribs.  COVID-19 medications: Steroids: 10/22>> Remdesivir: 10/22>>10/26 Baricitinib: 10/31>>  Antibiotics: Vancomycin: 10/30>> 10/31 Cefepime: 10/30>> 10/31  Microbiology data: 10/22 >>blood culture: No growth 10/30>>  blood culture: No growth  Procedures: None  Consults: None  DVT prophylaxis: enoxaparin (LOVENOX) injection 40 mg Start: 02/15/20 2300    Subjective:   Continues to have exertional dyspnea with minimal exertion-otherwise no complaints.  On 10 L of HFNC.   Assessment  & Plan :   Acute hypoxic respiratory failure secondary to Covid 19 Viral pneumonia: Has severe hypoxemia-but appears stable at rest-continues to have severe exertional dyspnea with minimal activity-remains on 10 L-on steroids/remdesivir/baricitinib.  Suspect that he may have some amount of radiation induced fibrosis at baseline-continues to have a right lower lobe mass/cancer on imaging studies.  Although essentially unchanged compared to yesterday-he is at risk of further worsening given his underlying medical  comorbidities.   Fever: afebrile O2 requirements:  SpO2: 90 % O2 Flow Rate (L/min): 10 L/min   COVID-19 Labs: Recent Labs    02/15/20 1243 02/15/20 1316 02/15/20 1426 02/15/20 2321 02/17/20 0424  DDIMER  --  4.97*  --  2.77* 1.61*  FERRITIN 1,614*  --   --  1,293* 1,329*  LDH  --   --  283*  --   --   CRP 7.1*  --   --  7.5* 6.2*       Component Value Date/Time   BNP 118.2 (H) 02/15/2020 1243    Recent Labs  Lab 02/15/20 1426  PROCALCITON 0.11    Lab Results  Component Value Date   SARSCOV2NAA POSITIVE (A) 02/07/2020   SARSCOV2NAA NEGATIVE 02/03/2020   [place] NEGATIVE 12/20/2019   [place] NEGATIVE 12/14/2019     Prone/Incentive Spirometry: encouraged  incentive spirometry use 3-4/hour.   COPD: No evidence of flare-continue bronchodilators  Chronic diastolic heart failure: Euvolemic-follow volume status closely.  History of prior CVA/carotid endarterectomy: Continue Plavix.  Stage IIIb adenocarcinoma of the lung: Claims recently completed RTX at [facility] 6 weeks back-before that he was on chemo at the [facility] hospital.  CT chest continues to show right lower lobe  mass.  Follow with oncology post discharge.  Incidental finding of right rib fractures: Supportive care.  Does not have significant pain-apparently did fall off his bed (soft fall per wife) approximately a week back-per spouse-she was told that he is going to have weak ribs due to radiation.  Schizophrenia: Appears stable-continue thiothixene   Palliative care/goals of care discussion: Understands that he is severely weak with extensive parenchymal lung injury due to COVID-19 pneumonia-he reconfirms that he does not want to be intubated in case he worsens-but does want to pursue CPR for arrhythmias etc. He understands that he remains at significant risk for further worsening in spite of aggressive medical care-given his underlying comorbidities.  Spouse aware of tenuous clinical situation as well.   ABG:    Component Value Date/Time   PHART 7.477 (H) 02/15/2020 1314   PCO2ART 26.3 (L) 02/15/2020 1314   PO2ART 114 (H) 02/15/2020 1314   HCO3 19.4 (L) 02/15/2020 1314   TCO2 20 (L) 02/15/2020 1314   ACIDBASEDEF 2.0 02/15/2020 1314   O2SAT 99.0 02/15/2020 1314    Vent Settings: N/A    Condition -Guarded  Family Communication  : Spouse-Ruth-[phone]-on 11/1 (patient's son was also in on the call when I was speaking to the patient spouse)  Code Status :  DNI  Diet :  Diet Order            Diet Heart Room service appropriate? Yes; Fluid consistency: Thin  Diet effective now                  Disposition Plan  :   Status is: Inpatient  Remains inpatient appropriate because:Inpatient level of care appropriate due to severity of illness  Dispo: The patient is from: Home              Anticipated d/c is to: Home              Anticipated d/c date is: > 3 days              Patient currently is not medically stable to d/c.   Barriers to discharge: Hypoxia requiring O2 supplementation  Antimicorbials  :    Anti-infectives (From admission, onward)   Start  Dose/Rate Route  Frequency Ordered Stop   02/16/20 1000  ceFEPIme (MAXIPIME) 2 g in sodium chloride 0.9 % 100 mL IVPB  Status:  Discontinued        2 g 200 mL/hr over 30 Minutes Intravenous Every 12 hours 02/15/20 1951 02/16/20 1105   02/16/20 0800  vancomycin (VANCOREADY) IVPB 500 mg/100 mL  Status:  Discontinued        500 mg 100 mL/hr over 60 Minutes Intravenous Every 12 hours 02/15/20 1844 02/16/20 1105   02/15/20 1845  ceFEPIme (MAXIPIME) 2 g in sodium chloride 0.9 % 100 mL IVPB        2 g 200 mL/hr over 30 Minutes Intravenous  Once 02/15/20 1839 02/15/20 1949   02/15/20 1845  vancomycin (VANCOREADY) IVPB 1500 mg/300 mL        1,500 mg 150 mL/hr over 120 Minutes Intravenous  Once 02/15/20 1844 02/15/20 2152      Inpatient Medications  Scheduled Meds: . vitamin C  500 mg Oral Daily  . baricitinib  4 mg Oral Daily  . benzonatate  200 mg Oral TID  . clopidogrel  75 mg Oral QHS  . enoxaparin (LOVENOX) injection  40 mg Subcutaneous Q24H  . feeding supplement  237 mL Oral BID BM  . gemfibrozil  600 mg Oral BID AC  . guaiFENesin  600 mg Oral BID  . mouth rinse  15 mL Mouth Rinse BID  . methylPREDNISolone (SOLU-MEDROL) injection  60 mg Intravenous Q12H  . oxymetazoline  3 spray Each Nare BID  . pantoprazole  40 mg Oral Daily  . rosuvastatin  40 mg Oral Daily  . sodium chloride flush  3 mL Intravenous Q12H  . thiothixene  15 mg Oral QHS  . zinc sulfate  220 mg Oral Daily   Continuous Infusions: . lactated ringers Stopped (02/16/20 1900)   PRN Meds:.acetaminophen, albuterol, alum & mag hydroxide-simeth, chlorpheniramine-HYDROcodone, HYDROcodone-acetaminophen, morphine injection, ondansetron **OR** ondansetron (ZOFRAN) IV, sodium chloride   Time Spent in minutes  35  See all Orders from today for further details   Oren Binet M.D on 02/17/2020 at 12:05 PM  To page go to [email or web address] - use universal password  [facility] -  Office  [phone]    Objective:   Vitals:    02/17/20 0259 02/17/20 0405 02/17/20 0700 02/17/20 0746  BP:  90/67 95/68 90/63   Pulse: 77 77 89 90  Resp: 18 19 (!) 22 19  Temp:  97.7 F (36.5 C) 97.8 F (36.6 C) 97.9 F (36.6 C)  TempSrc:  Oral Oral Oral  SpO2: 90% 94% 91% 90%  Weight:      Height:        Wt Readings from Last 3 Encounters:  02/16/20 67 kg  02/08/20 67.1 kg  02/03/20 69.7 kg     Intake/Output Summary (Last 24 hours) at 02/17/2020 1205 Last data filed at 02/17/2020 1054 Gross per 24 hour  Intake 426 ml  Output 995 ml  Net -569 ml     Physical Exam Gen Exam:Alert awake-not in any distress HEENT:atraumatic, normocephalic Chest: B/L clear to auscultation anteriorly CVS:S1S2 regular Abdomen:soft non tender, non distended Extremities:no edema Neurology: Non focal Skin: no rash   Data Review:    CBC Recent Labs  Lab 02/15/20 1243 02/15/20 1314 02/15/20 2321 02/17/20 0424  WBC 9.2  --  5.6 5.2  HGB 16.9 15.3 12.9* 11.7*  HCT 52.6* 45.0 39.7 35.5*  PLT 308  --  188 188  MCV 101.9*  --  101.5* 99.4  MCH 32.8  --  33.0 32.8  MCHC 32.1  --  32.5 33.0  RDW 14.4  --  14.4 13.8  LYMPHSABS 0.7  --  0.3* 0.3*  MONOABS 1.3*  --  0.7 0.2  EOSABS 0.0  --  0.0 0.0  BASOSABS 0.1  --  0.0 0.0    Chemistries  Recent Labs  Lab 02/15/20 1314 02/15/20 1426 02/15/20 2321 02/17/20 0424  NA 137 136 132* 136  K 3.9 3.9 3.8 4.2  CL  --  102 104 106  CO2  --  23 21* 21*  GLUCOSE  --  114* 95 169*  BUN  --  33* 27* 22  CREATININE  --  1.23 0.98 0.91  CALCIUM  --  8.9 8.0* 8.3*  MG  --   --  1.9 2.1  AST  --  31 24 23   ALT  --  17 16 14   ALKPHOS  --  72 60 54  BILITOT  --  1.2 1.0 0.8   ------------------------------------------------------------------------------------------------------------------ Recent Labs    02/15/20 1243  TRIG 159*    Lab Results  Component Value Date   HGBA1C 5.1 05/01/2017    ------------------------------------------------------------------------------------------------------------------ No results for input(s): TSH, T4TOTAL, T3FREE, THYROIDAB in the last 72 hours.  Invalid input(s): FREET3 ------------------------------------------------------------------------------------------------------------------ Recent Labs    02/15/20 2321 02/17/20 0424  FERRITIN 1,293* 1,329*    Coagulation profile No results for input(s): INR, PROTIME in the last 168 hours.  Recent Labs    02/15/20 2321 02/17/20 0424  DDIMER 2.77* 1.61*    Cardiac Enzymes No results for input(s): CKMB, TROPONINI, MYOGLOBIN in the last 168 hours.  Invalid input(s): CK ------------------------------------------------------------------------------------------------------------------    Component Value Date/Time   BNP 118.2 (H) 02/15/2020 1243    Micro Results Recent Results (from the past 240 hour(s))  Respiratory Panel by RT PCR (Flu A&B, Covid) - Nasopharyngeal Swab     Status: Abnormal   Collection Time: 02/07/20  6:59 PM   Specimen: Nasopharyngeal Swab  Result Value Ref Range Status   SARS Coronavirus 2 by RT PCR POSITIVE (A) NEGATIVE Final    Comment: RESULT CALLED TO, READ BACK BY AND VERIFIED WITH: NEAL KELLIE RN AT 2040 ON 02/07/20 BY I.[email or web address] (NOTE) SARS-CoV-2 target nucleic acids are DETECTED.  SARS-CoV-2 RNA is generally detectable in upper respiratory specimens  during the acute phase of infection. Positive results are indicative of the presence of the identified virus, but do not rule out bacterial infection or co-infection with other pathogens not detected by the test. Clinical correlation with patient history and other diagnostic information is necessary to determine patient infection status. The expected result is Negative.  Fact Sheet for Patients:  PinkCheek.be  Fact Sheet for Healthcare  Providers: GravelBags.it  This test is not yet approved or cleared by the [place] FDA and  has been authorized for detection and/or diagnosis of SARS-CoV-2 by FDA under an Emergency Use Authorization (EUA).  This EUA will remain in effect (meaning this t est can be used) for the duration of  the COVID-19 declaration under Section 564(b)(1) of the Act, 21 U.S.C. section 360bbb-3(b)(1), unless the authorization is terminated or revoked sooner.      Influenza A by PCR NEGATIVE NEGATIVE Final   Influenza B by PCR NEGATIVE NEGATIVE Final    Comment: (NOTE) The Xpert Xpress SARS-CoV-2/FLU/RSV assay is intended as an aid in  the diagnosis of influenza from Nasopharyngeal swab specimens and  should not be used  as a sole basis for treatment. Nasal washings and  aspirates are unacceptable for Xpert Xpress SARS-CoV-2/FLU/RSV  testing.  Fact Sheet for Patients: PinkCheek.be  Fact Sheet for Healthcare Providers: GravelBags.it  This test is not yet approved or cleared by the [place] FDA and  has been authorized for detection and/or diagnosis of SARS-CoV-2 by  FDA under an Emergency Use Authorization (EUA). This EUA will remain  in effect (meaning this test can be used) for the duration of the  Covid-19 declaration under Section 564(b)(1) of the Act, 21  U.S.C. section 360bbb-3(b)(1), unless the authorization is  terminated or revoked. Performed at [facility], [address] [place], [place] [address]   Culture, blood (routine x 2)     Status: None   Collection Time: 02/07/20  7:00 PM   Specimen: BLOOD  Result Value Ref Range Status   Specimen Description   Final    BLOOD RIGHT ANTECUBITAL Performed at [facility], [address] [place], [place] [address]    Special Requests   Final    BOTTLES DRAWN AEROBIC AND ANAEROBIC Blood Culture adequate volume Performed  at [facility], [address] [place], [place] [address]    Culture   Final    NO GROWTH 5 DAYS Performed at [facility], [place] [address] [place], [place] [address]    Report Status 02/13/2020 FINAL  Final  Culture, blood (routine x 2)     Status: None   Collection Time: 02/07/20  7:00 PM   Specimen: BLOOD  Result Value Ref Range Status   Specimen Description   Final    BLOOD BLOOD LEFT FOREARM Performed at [facility], [address] [place], [place] [address]    Special Requests   Final    BOTTLES DRAWN AEROBIC AND ANAEROBIC Blood Culture adequate volume Performed at [facility], [address] [place], [place] [address]    Culture   Final    NO GROWTH 5 DAYS Performed at [facility], [place] [address] [place], [place] [address]    Report Status 02/13/2020 FINAL  Final  Blood Culture (routine x 2)     Status: None (Preliminary result)   Collection Time: 02/15/20 12:45 PM   Specimen: BLOOD LEFT FOREARM  Result Value Ref Range Status   Specimen Description BLOOD LEFT FOREARM  Final   Special Requests   Final    BOTTLES DRAWN AEROBIC AND ANAEROBIC Blood Culture results may not be optimal due to an inadequate volume of blood received in culture bottles   Culture   Final    NO GROWTH 2 DAYS Performed at [facility], [place] [address], [place], [place] [address]    Report Status PENDING  Incomplete  Blood Culture (routine x 2)     Status: None (Preliminary result)   Collection Time: 02/15/20  1:16 PM   Specimen: BLOOD  Result Value Ref Range Status   Specimen Description BLOOD RIGHT ANTECUBITAL  Final   Special Requests   Final    BOTTLES DRAWN AEROBIC AND ANAEROBIC Blood Culture results may not be optimal due to an inadequate volume of blood received in culture bottles   Culture   Final    NO GROWTH 2 DAYS Performed at [facility], [place] [address], [place], [place] [address]    Report Status PENDING  Incomplete   MRSA PCR Screening  Status: None   Collection Time: 02/15/20  8:12 PM   Specimen: Nasal Mucosa; Nasopharyngeal  Result Value Ref Range Status   MRSA by PCR NEGATIVE NEGATIVE Final    Comment:        The GeneXpert MRSA Assay (FDA approved for NASAL specimens only), is one component of a comprehensive MRSA colonization surveillance program. It is not intended to diagnose MRSA infection nor to guide or monitor treatment for MRSA infections. Performed at [facility], [place] [address], [place], [place] [address]     Radiology Reports DG Chest 2 View  Result Date: 02/03/2020 CLINICAL DATA:  Hypotension, shortness of breath EXAM: CHEST - 2 VIEW COMPARISON:  12/20/2019 FINDINGS: Patchy airspace disease in the lower lobes concerning for pneumonia. Heart is normal size. No effusions or pneumothorax. No acute bony abnormality. IMPRESSION: Patchy bilateral lower lobe airspace opacities concerning for pneumonia. Electronically Signed   By: Rolm Baptise M.D.   On: 02/03/2020 17:42   CT Angio Chest PE W and/or Wo Contrast  Result Date: 02/15/2020 CLINICAL DATA:  Worsening shortness of breath. EXAM: CT ANGIOGRAPHY CHEST WITH CONTRAST TECHNIQUE: Multidetector CT imaging of the chest was performed using the standard protocol during bolus administration of intravenous contrast. Multiplanar CT image reconstructions and MIPs were obtained to evaluate the vascular anatomy. CONTRAST:  50mL OMNIPAQUE IOHEXOL 350 MG/ML SOLN COMPARISON:  CT dated 02/07/2020 FINDINGS: Cardiovascular: Contrast injection is sufficient to demonstrate satisfactory opacification of the pulmonary arteries to the segmental level. There is no pulmonary embolus or evidence of right heart strain. The size of the main pulmonary artery is normal. Normal heart size with coronary artery calcification. The course and caliber of the aorta are normal. There is mild atherosclerotic calcification. Opacification decreased due to pulmonary  arterial phase contrast bolus timing. Mediastinum/Nodes: -- No mediastinal lymphadenopathy. -- No hilar lymphadenopathy. -- No axillary lymphadenopathy. -- No supraclavicular lymphadenopathy. -- Normal thyroid gland where visualized. -  Unremarkable esophagus. Lungs/Pleura: Again noted is a cavitary mass in the right lower lobe similar to prior study. There are persistent ground-glass airspace opacities in the right upper lobe which are essentially stable from prior study. There are worsening peripheral ground-glass airspace opacities in the left lower and left upper lobe. There is no pneumothorax. No large pleural effusion. Upper Abdomen: Contrast bolus timing is not optimized for evaluation of the abdominal organs. The visualized portions of the organs of the upper abdomen are normal. Musculoskeletal: There are subacute right-sided rib fractures involving the seventh through ninth ribs. Review of the MIP images confirms the above findings. IMPRESSION: 1. No evidence for acute pulmonary embolus. 2. Worsening peripheral ground-glass airspace opacities in the left lower and left upper lobe, concerning for pneumonia (viral or bacterial) persistent cavitary mass in the right lower lobe. 3. Subacute right-sided rib fractures involving the seventh through ninth ribs. Aortic Atherosclerosis (ICD10-I70.0). Electronically Signed   By: Constance Holster M.D.   On: 02/15/2020 17:36   CT Angio Chest PE W and/or Wo Contrast  Result Date: 02/07/2020 CLINICAL DATA:  Positive D-dimer, shortness of breath, tachycardia. EXAM: CT ANGIOGRAPHY CHEST WITH CONTRAST TECHNIQUE: Multidetector CT imaging of the chest was performed using the standard protocol during bolus administration of intravenous contrast. Multiplanar CT image reconstructions and MIPs were obtained to evaluate the vascular anatomy. CONTRAST:  18mL OMNIPAQUE IOHEXOL 350 MG/ML SOLN COMPARISON:  12/20/2019 FINDINGS: Cardiovascular: No filling defects in the pulmonary  arteries to suggest pulmonary emboli. Heart is normal size. Aortic and coronary artery calcifications.  No aneurysm. Mediastinum/Nodes: Stable borderline sized right hilar lymph node. Scattered small and borderline sized mediastinal lymph nodes, stable. Trachea and esophagus are unremarkable. Thyroid unremarkable. Lungs/Pleura: Trace right pleural effusion. Masslike consolidation in the right lower lobe is again noted and is stable since prior study. New rounded masslike area of consolidation in the right upper lobe measuring 2.5 cm. Increasing airspace disease posteriorly and peripherally in the right upper lobe and superior segment of the right lower lobe. Increasing ground-glass airspace opacity posteriorly in the superior segment of the left lower lobe. Paraseptal emphysema. Peripheral reticulation in the lungs bilaterally compatible with fibrosis. None none Upper Abdomen: Imaging into the upper abdomen demonstrates no acute findings. Musculoskeletal: Chest wall soft tissues are unremarkable. No acute bony abnormality. Review of the MIP images confirms the above findings. IMPRESSION: Rounded area of masslike consolidation in the right lower lobe with cavitation again noted, unchanged. New airspace disease seen in the right upper lobe and right lower lobe as well as superior segment of the left lower lobe. These areas are concerning for pneumonia. Areas of peripheral interstitial prominence and fibrosis bilaterally. Right hilar and borderline mediastinal lymph nodes are stable. Coronary artery disease. Aortic Atherosclerosis (ICD10-I70.0). Electronically Signed   By: Rolm Baptise M.D.   On: 02/07/2020 20:24   DG Chest Port 1 View  Result Date: 02/15/2020 CLINICAL DATA:  Cough, worsening dyspnea. EXAM: PORTABLE CHEST 1 VIEW COMPARISON:  02/07/2020 FINDINGS: Improved aeration at the right lung base compared to the prior examination. Interstitial thickening and chronic changes at the left lung base are  unchanged. Heart and mediastinum are grossly stable. IMPRESSION: 1. Improved aeration at the right lung base compared to the recent comparison examination. 2. Chronic changes. Electronically Signed   By: Markus Daft M.D.   On: 02/15/2020 13:16   DG Chest Portable 1 View  Result Date: 02/07/2020 CLINICAL DATA:  Shortness of breath, cough, congestion EXAM: PORTABLE CHEST 1 VIEW COMPARISON:  02/03/2020 FINDINGS: Airspace disease in the right mid and lower lung again noted, most confluent at the right lung base compatible with pneumonia. Heart is normal size. Left lung clear. Previously seen patchy opacities at the left base have resolved. No effusions or acute bony abnormality. IMPRESSION: Continued patchy right mid and lower lung airspace opacities concerning for pneumonia. Clearing of the left basilar opacities since prior study. Electronically Signed   By: Rolm Baptise M.D.   On: 02/07/2020 18:51   VAS [place] LOWER EXTREMITY VENOUS (DVT)  Result Date: 02/10/2020  Lower Venous DVT Study Indications: Elevated Ddimer.  Risk Factors: COVID 19 positive. Comparison Study: No prior studies. Performing Technologist: Oliver Hum RVT  Examination Guidelines: A complete evaluation includes B-mode imaging, spectral Doppler, color Doppler, and power Doppler as needed of all accessible portions of each vessel. Bilateral testing is considered an integral part of a complete examination. Limited examinations for reoccurring indications may be performed as noted. The reflux portion of the exam is performed with the patient in reverse Trendelenburg.  +---------+---------------+---------+-----------+----------+--------------+ RIGHT    CompressibilityPhasicitySpontaneityPropertiesThrombus Aging +---------+---------------+---------+-----------+----------+--------------+ CFV      Full           Yes      Yes                                 +---------+---------------+---------+-----------+----------+--------------+  SFJ      Full                                                        +---------+---------------+---------+-----------+----------+--------------+  FV Prox  Full                                                        +---------+---------------+---------+-----------+----------+--------------+ FV Mid   Full                                                        +---------+---------------+---------+-----------+----------+--------------+ FV DistalFull                                                        +---------+---------------+---------+-----------+----------+--------------+ PFV      Full                                                        +---------+---------------+---------+-----------+----------+--------------+ POP      Full           Yes      Yes                                 +---------+---------------+---------+-----------+----------+--------------+ PTV      Full                                                        +---------+---------------+---------+-----------+----------+--------------+ PERO     Full                                                        +---------+---------------+---------+-----------+----------+--------------+   +---------+---------------+---------+-----------+----------+--------------+ LEFT     CompressibilityPhasicitySpontaneityPropertiesThrombus Aging +---------+---------------+---------+-----------+----------+--------------+ CFV      Full           Yes      Yes                                 +---------+---------------+---------+-----------+----------+--------------+ SFJ      Full                                                        +---------+---------------+---------+-----------+----------+--------------+ FV Prox  Full                                                        +---------+---------------+---------+-----------+----------+--------------+  FV Mid   Full                                                         +---------+---------------+---------+-----------+----------+--------------+ FV DistalFull                                                        +---------+---------------+---------+-----------+----------+--------------+ PFV      Full                                                        +---------+---------------+---------+-----------+----------+--------------+ POP      Full           Yes      Yes                                 +---------+---------------+---------+-----------+----------+--------------+ PTV      Full                                                        +---------+---------------+---------+-----------+----------+--------------+ PERO     Full                                                        +---------+---------------+---------+-----------+----------+--------------+     Summary: RIGHT: - There is no evidence of deep vein thrombosis in the lower extremity.  - No cystic structure found in the popliteal fossa.  LEFT: - There is no evidence of deep vein thrombosis in the lower extremity.  - No cystic structure found in the popliteal fossa.  *See table(s) above for measurements and observations. Electronically signed by Ruta Hinds MD on 02/10/2020 at 6:25:28 PM.    Final    US Abdomen Limited RUQ (LIVER/GB)  Result Date: 02/15/2020 CLINICAL DATA:  Right upper quadrant abdominal pain. History of prior cholecystectomy. EXAM: ULTRASOUND ABDOMEN LIMITED RIGHT UPPER QUADRANT COMPARISON:  None. FINDINGS: Gallbladder: The gallbladder is surgically absent. Common bile duct: Diameter: 4.9 mm Liver: No focal lesion identified. Within normal limits in parenchymal echogenicity. Portal vein is patent on color Doppler imaging with normal direction of blood flow towards the liver. Other: The study is limited secondary to overlying bowel gas. IMPRESSION: 1. Findings consistent with prior cholecystectomy. 2. Otherwise, unremarkable right upper  quadrant ultrasound. Electronically Signed   By: Virgina Norfolk M.D.   On: 02/15/2020 21:02

## 2020-02-17 NOTE — Evaluation (Signed)
Occupational Therapy Evaluation Patient Details Name: Melvin Foley MRN: [id number] DOB: Sep 08, 1945 Today's Date: 02/17/2020    History of Present Illness 74 y.o. male with medical history significant of lung cancer (finished radiation 6 weeks ago), COPD, chronic diastolic CHF, CVA, schizophrenia, presented to ED 10/30 with known COVID infection (+10/22 with hospitalization 10/22-10/25) and worsening dyspnea. CTA negative for PE   Clinical Impression   PTA, pt was living with his wife and was independent with BADLs and used RW for mobility. Pt currently requiring Min A for LB ADLs and functional mobility. Pt presenting with poor balance, strength, and activity tolerance. Pt with wet cough throughout session, but unproductive. At rest, SpO2 90s on 11L O2. During short distance mobility, O2 monitor not readying; once at EOB, SpO2 88% on 15L O2. Educating pt on prone position in bed, but he reports he cannot rest in this position and declined. Pt would benefit from further acute OT to facilitate safe dc. Recommend dc to home with HHOT for further OT to optimize safety, independence with ADLs, and return to PLOF.     Follow Up Recommendations  Home health OT;Supervision/Assistance - 24 hour    Equipment Recommendations  3 in 1 bedside commode    Recommendations for Other Services PT consult     Precautions / Restrictions Precautions Precautions: Fall      Mobility Bed Mobility Overal bed mobility: Needs Assistance Bed Mobility: Sit to Supine       Sit to supine: Min guard   General bed mobility comments: Min guard A for safety. No physical A needed    Transfers Overall transfer level: Needs assistance Equipment used: Rolling walker (2 wheeled);None Transfers: Sit to/from [employer] to Stand: Min guard         General transfer comment: vc for safe hand placement    Balance Overall balance assessment: Needs assistance Sitting-balance support:  No upper extremity supported;Feet supported Sitting balance-Leahy Scale: Fair     Standing balance support: No upper extremity supported Standing balance-Leahy Scale: Fair                             ADL either performed or assessed with clinical judgement   ADL Overall ADL's : Needs assistance/impaired Eating/Feeding: Set up;Supervision/ safety;Sitting   Grooming: Supervision/safety;Set up;Sitting   Upper Body Bathing: Min guard;Sitting   Lower Body Bathing: Minimal assistance;Sit to/from stand   Upper Body Dressing : Min guard;Sitting   Lower Body Dressing: Minimal assistance;Sit to/from stand   Toilet Transfer: Min Geneticist, molecular Details (indicate cue type and reason): Pt transfering to [facility] to with [employer] A         Functional mobility during ADLs: Min guard General ADL Comments: Pt presenting with decreased balance, strength, and activity tolerance.      Vision Baseline Vision/History: Wears glasses Wears Glasses: At all times Patient Visual Report: No change from baseline       Perception     Praxis      Pertinent Vitals/Pain Pain Assessment: No/denies pain     Hand Dominance Right   Extremity/Trunk Assessment Upper Extremity Assessment Upper Extremity Assessment: Generalized weakness   Lower Extremity Assessment Lower Extremity Assessment: Defer to PT evaluation   Cervical / Trunk Assessment Cervical / Trunk Assessment: Normal   Communication Communication Communication: HOH   Cognition Arousal/Alertness: Awake/alert Behavior During Therapy: Flat affect Overall Cognitive Status: No family/caregiver present to determine baseline cognitive functioning  General Comments: slow processing: ?HOH   General Comments  SpO2 90s on 11L while resting in recliner. During mobility, SpO2 not reading. Pt performing functional mobility on 15L; once back in bed, SpO2 88%.      Exercises     Shoulder Instructions      Home Living Family/patient expects to be discharged to:: Private residence Living Arrangements: Spouse/significant other Available Help at Discharge: Family;Available PRN/intermittently Type of Home: House Home Access: Level entry     Home Layout: One level     Bathroom Shower/Tub: Occupational psychologist: Standard Bathroom Accessibility: Yes   Home Equipment: Environmental consultant - 2 wheels;Shower seat ([facility] has ordered manual w/c for going to appts)          Prior Functioning/Environment Level of Independence: Independent with assistive device(s)        Comments: uses RW most of the time        OT Problem List: Decreased strength;Decreased range of motion;Decreased activity tolerance;Impaired balance (sitting and/or standing);Decreased safety awareness;Decreased knowledge of use of DME or AE;Decreased knowledge of precautions;Cardiopulmonary status limiting activity      OT Treatment/Interventions: Self-care/ADL training;Therapeutic exercise;Energy conservation;DME and/or AE instruction;Therapeutic activities;Patient/family education    OT Goals(Current goals can be found in the care plan section) Acute Rehab OT Goals Patient Stated Goal: go home ASAP OT Goal Formulation: With patient Time For Goal Achievement: 03/02/20 Potential to Achieve Goals: Good  OT Frequency: Min 2X/week   Barriers to D/C:            Co-evaluation              AM-PAC OT "6 Clicks" Daily Activity     Outcome Measure Help from another person eating meals?: None Help from another person taking care of personal grooming?: A Little Help from another person toileting, which includes using toliet, bedpan, or urinal?: A Little Help from another person bathing (including washing, rinsing, drying)?: A Little Help from another person to put on and taking off regular upper body clothing?: A Little Help from another person to put on and taking off  regular lower body clothing?: A Little 6 Click Score: 19   End of Session Equipment Utilized During Treatment: Oxygen (11L) Nurse Communication: Mobility status  Activity Tolerance: Patient tolerated treatment well Patient left: in bed;with call bell/phone within reach;with bed alarm set  OT Visit Diagnosis: Unsteadiness on feet (R26.81);Other abnormalities of gait and mobility (R26.89);Muscle weakness (generalized) (M62.81)                Time: 6433-2951 OT Time Calculation (min): 15 min Charges:  OT General Charges $OT Visit: 1 Visit OT Evaluation $OT Eval Moderate Complexity: [place], OTR/L Acute Rehab Pager: [phone] Office: [place] 02/17/2020, 5:08 PM

## 2020-02-17 NOTE — Evaluation (Signed)
Physical Therapy Evaluation Patient Details Name: Melvin Foley MRN: [id number] DOB: 1945/09/02 Today's Date: 02/17/2020   History of Present Illness  74 y.o. male with medical history significant of lung cancer (finished radiation 6 weeks ago), COPD, chronic diastolic CHF, CVA, schizophrenia, presented to ED 10/30 with known COVID infection (+10/22 with hospitalization 10/22-10/25) and worsening dyspnea. CTA negative for PE  Clinical Impression   Pt admitted with above diagnosis. Prior to illness, pt reports he used a rolling walker almost all the time. Today he required encouragement to participate (on entry to room, pt in sidelying with HOB flat with sats 88% on 9L). He required up to min assist for mobility with moderated cues for safety with lines. Able to walk 12 ft with RW on 10L HFNC. Required prolonged recovery time and therefore deferred a second bout of ambulation. Dr. Sloan Leiter in during session and reiterating the importance of activity in his recovery.  Pt currently with functional limitations due to the deficits listed below (see PT Problem List). Pt will benefit from skilled PT to increase their independence and safety with mobility to allow discharge to the venue listed below.    At rest, 9L sats 88% HR 102 -to BSC sats 84% HR 118, sats not improving with rest/pursed lip breathing therefore incr to 10L with sats up to 91% HR 110 -walked on 10L with sats increasing to 93% HR 118; once seated, sats decr to 83% HR124 with slow recovery to 88%, -seated talking with sats dropping as low as 82% with HR 129--RN in and increased O2   Follow Up Recommendations Home health PT;Supervision/Assistance - 24 hour    Equipment Recommendations  None recommended by PT    Recommendations for Other Services OT consult     Precautions / Restrictions Precautions Precautions: Fall      Mobility  Bed Mobility Overal bed mobility: Needs Assistance Bed Mobility: Sidelying to Sit    Sidelying to sit: Min guard       General bed mobility comments: HOB flat, assist to manage lines as pt urgently getting up to use BSC    Transfers Overall transfer level: Needs assistance Equipment used: Rolling walker (2 wheeled);None Transfers: Sit to/from [employer] to Stand: Min guard Stand pivot transfers: Min guard       General transfer comment: vc for safe hand placement with RW  Ambulation/Gait Ambulation/Gait assistance: Min assist Gait Distance (Feet): 12 Feet Assistive device: Rolling walker (2 wheeled) Gait Pattern/deviations: Step-through pattern;Decreased stride length;Trunk flexed Gait velocity: slow   General Gait Details: pt catching wheels of RW on legs of furniture and then lifts RW to untangle; imbalance when lifting RW  Stairs            Wheelchair Mobility    Modified Rankin (Stroke Patients Only)       Balance Overall balance assessment: Needs assistance Sitting-balance support: No upper extremity supported;Feet supported Sitting balance-Leahy Scale: Fair     Standing balance support: No upper extremity supported Standing balance-Leahy Scale: Fair                               Pertinent Vitals/Pain Pain Assessment: No/denies pain    Home Living Family/patient expects to be discharged to:: Private residence Living Arrangements: Spouse/significant other Available Help at Discharge: Family;Available PRN/intermittently Type of Home: House Home Access: Level entry     Home Layout: One level Home Equipment: Walker - 2 wheels;Shower  seat ([facility] manual w/c for going to appts)      Prior Function Level of Independence: Independent with assistive device(s)         Comments: uses RW most of the time     Hand Dominance   Dominant Hand: Right    Extremity/Trunk Assessment   Upper Extremity Assessment Upper Extremity Assessment: Defer to OT evaluation    Lower Extremity  Assessment Lower Extremity Assessment: Generalized weakness    Cervical / Trunk Assessment Cervical / Trunk Assessment: Normal  Communication   Communication: HOH (?HOH)  Cognition Arousal/Alertness: Awake/alert Behavior During Therapy: Flat affect Overall Cognitive Status: No family/caregiver present to determine baseline cognitive functioning                                 General Comments: slow processing: ?HOH      General Comments General comments (skin integrity, edema, etc.): Patient initially stating "that nurse just told me I could stay in bed until noon."    Exercises Other Exercises Other Exercises: Use of IS. Pt initially pulling 250 ml with rapid consecutive breaths. Educated on proper use and by end of session pulling 1047ml.  Other Exercises: Educated on pursed lip breathing with pt demonstrating fair technique and required cues to utilize when sats drop "whenever you hear the bell ringing"   Assessment/Plan    PT Assessment Patient needs continued PT services  PT Problem List Decreased strength;Decreased activity tolerance;Decreased balance;Decreased mobility;Decreased cognition;Decreased knowledge of use of DME;Decreased safety awareness;Cardiopulmonary status limiting activity       PT Treatment Interventions DME instruction;Gait training;Functional mobility training;Therapeutic activities;Therapeutic exercise;Balance training;Cognitive remediation;Patient/family education    PT Goals (Current goals can be found in the Care Plan section)  Acute Rehab PT Goals Patient Stated Goal: go home ASAP PT Goal Formulation: With patient Time For Goal Achievement: 03/02/20 Potential to Achieve Goals: Good    Frequency Min 3X/week   Barriers to discharge        Co-evaluation               AM-PAC PT "6 Clicks" Mobility  Outcome Measure Help needed turning from your back to your side while in a flat bed without using bedrails?: None Help  needed moving from lying on your back to sitting on the side of a flat bed without using bedrails?: None Help needed moving to and from a bed to a chair (including a wheelchair)?: A Little Help needed standing up from a chair using your arms (e.g., wheelchair or bedside chair)?: A Little Help needed to walk in hospital room?: A Little Help needed climbing 3-5 steps with a railing? : A Little 6 Click Score: 20    End of Session Equipment Utilized During Treatment: Oxygen Activity Tolerance: Patient limited by fatigue;Treatment limited secondary to medical complications (Comment) (decr sats) Patient left: in chair;with call bell/phone within reach;with chair alarm set Nurse Communication: Mobility status;Other (comment) (incr O2 to 10L; pt reported she said he could stay in bed!) PT Visit Diagnosis: Difficulty in walking, not elsewhere classified (R26.2);Muscle weakness (generalized) (M62.81)    Time: 6803-2122 PT Time Calculation (min) (ACUTE ONLY): 56 min   Charges:   PT Evaluation $PT Eval Moderate Complexity: 1 Mod PT Treatments $Gait Training: 8-22 mins $Therapeutic Exercise: 23-37 mins         Arby Barrette, PT Pager [phone]   Rexanne Mano 02/17/2020, 12:54 PM

## 2020-02-18 DIAGNOSIS — F209 Schizophrenia, unspecified: Secondary | ICD-10-CM

## 2020-02-18 DIAGNOSIS — J069 Acute upper respiratory infection, unspecified: Secondary | ICD-10-CM

## 2020-02-18 DIAGNOSIS — S2241XA Multiple fractures of ribs, right side, initial encounter for closed fracture: Secondary | ICD-10-CM

## 2020-02-18 LAB — C-REACTIVE PROTEIN: CRP: 2.5 mg/dL — ABNORMAL HIGH (ref <1.0)

## 2020-02-18 LAB — CBC WITH DIFFERENTIAL/PLATELET
Abs Immature Granulocytes: 0.11 10*3/uL — ABNORMAL HIGH (ref 0.00–0.07)
Basophils Absolute: 0 10*3/uL (ref 0.0–0.1)
Basophils Relative: 0 %
Eosinophils Absolute: 0 10*3/uL (ref 0.0–0.5)
Eosinophils Relative: 0 %
HCT: 34.7 % — ABNORMAL LOW (ref 39.0–52.0)
Hemoglobin: 11.7 g/dL — ABNORMAL LOW (ref 13.0–17.0)
Immature Granulocytes: 1 %
Lymphocytes Relative: 2 %
Lymphs Abs: 0.2 10*3/uL — ABNORMAL LOW (ref 0.7–4.0)
MCH: 32.9 pg (ref 26.0–34.0)
MCHC: 33.7 g/dL (ref 30.0–36.0)
MCV: 97.5 fL (ref 80.0–100.0)
Monocytes Absolute: 0.4 10*3/uL (ref 0.1–1.0)
Monocytes Relative: 4 %
Neutro Abs: 10.7 10*3/uL — ABNORMAL HIGH (ref 1.7–7.7)
Neutrophils Relative %: 93 %
Platelets: 186 10*3/uL (ref 150–400)
RBC: 3.56 MIL/uL — ABNORMAL LOW (ref 4.22–5.81)
RDW: 13.6 % (ref 11.5–15.5)
WBC: 11.4 10*3/uL — ABNORMAL HIGH (ref 4.0–10.5)
nRBC: 0 % (ref 0.0–0.2)

## 2020-02-18 LAB — COMPREHENSIVE METABOLIC PANEL
ALT: 16 U/L (ref 0–44)
AST: 29 U/L (ref 15–41)
Albumin: 2.3 g/dL — ABNORMAL LOW (ref 3.5–5.0)
Alkaline Phosphatase: 56 U/L (ref 38–126)
Anion gap: 9 (ref 5–15)
BUN: 26 mg/dL — ABNORMAL HIGH (ref 8–23)
CO2: 23 mmol/L (ref 22–32)
Calcium: 8.7 mg/dL — ABNORMAL LOW (ref 8.9–10.3)
Chloride: 103 mmol/L (ref 98–111)
Creatinine, Ser: 0.9 mg/dL (ref 0.61–1.24)
GFR, Estimated: >60 mL/min (ref >60)
Glucose, Bld: 176 mg/dL — ABNORMAL HIGH (ref 70–99)
Potassium: 4.3 mmol/L (ref 3.5–5.1)
Sodium: 135 mmol/L (ref 135–145)
Total Bilirubin: 0.6 mg/dL (ref 0.3–1.2)
Total Protein: 6 g/dL — ABNORMAL LOW (ref 6.5–8.1)

## 2020-02-18 LAB — GLUCOSE, CAPILLARY
Glucose-Capillary: 152 mg/dL — ABNORMAL HIGH (ref 70–99)
Glucose-Capillary: 142 mg/dL — ABNORMAL HIGH (ref 70–99)

## 2020-02-18 LAB — FERRITIN: Ferritin: 1425 ng/mL — ABNORMAL HIGH (ref 24–336)

## 2020-02-18 LAB — D-DIMER, QUANTITATIVE: D-Dimer, Quant: 1.74 ug/mL-FEU — ABNORMAL HIGH (ref 0.00–0.50)

## 2020-02-18 LAB — MAGNESIUM: Magnesium: 1.9 mg/dL (ref 1.7–2.4)

## 2020-02-18 MED ORDER — ENSURE ENLIVE PO LIQD
237.0000 mL | Freq: Three times a day (TID) | ORAL | Status: DC
  Administered 2020-02-18 – 2020-02-26 (×22): 237 mL via ORAL

## 2020-02-18 NOTE — Progress Notes (Addendum)
PROGRESS NOTE                                                                                                                                                                                                             Patient Demographics:    Melvin Foley, is a 74 y.o. male, DOB - 1945/07/09, DQQ:[id number]  Outpatient Primary MD for the patient is Wallie Char, FNP   Admit date - 02/15/2020   LOS - 3  Chief Complaint  Patient presents with  . Covid Positive  . Pneumonia       Brief Narrative: Patient is a 74 y.o. male with PMHx of stage IIIb adenocarcinoma-finished radiation approximately 6 weeks back (follows at [facility])-recently hospitalized from 10/22-10/25 COVID-19 pneumonia-did not require O2 on discharge-presented back to the ED on 10/30 with shortness of breath-found to have severe hypoxic respiratory failure due to ongoing COVID-19 pneumonitis.  COVID-19 vaccinated status: Unvaccinated   Significant Events: 10/18>> evaluated at [facility] for hypotension/possible pneumonia-refused admission-Covid 19 negative 10/22-10/25>> hospitalization for Covid-discharged on room air. 10/30>> readmitted with severe hypoxemia-due to ongoing COVID-19 pneumonitis.  Significant studies: 10/22>>Chest x-ray: Patchy right mid/lower lung opacities concerning for pneumonia 10/22>> CTA chest: New airspace disease in the right upper/right lower lobe/left lower lobe-rounded area of masslike consolidation in the right lower lobe with cavitation again noted. 10/31>> CTA chest: No PE, worsening peripheral groundglass opacities, subacute right sided rib fractures along the seventh through ninth ribs.  COVID-19 medications: Steroids: 10/22>> Remdesivir: 10/22>>10/26 Baricitinib: 10/31>>  Antibiotics: Vancomycin: 10/30>> 10/31 Cefepime: 10/30>> 10/31  Microbiology data: 10/22 >>blood culture: No growth 10/30>>  blood culture: No growth  Procedures: None  Consults: None  DVT prophylaxis: enoxaparin (LOVENOX) injection 40 mg Start: 02/15/20 2300    Subjective:   Slowly improving-and down to 8 L of oxygen today.  Still feels short of breath with minimal activity and with coughing spells.   Assessment  & Plan :   Acute hypoxic respiratory failure secondary to Covid 19 Viral pneumonia: Slowly improving-had severe hypoxia during initial presentation-was on 12 L of HFNC-has gradually improved-Down to 8 L.  Continues to have exertional dyspnea with minimal activity and coughing spells.  Suspect that he may have some amount of radiation induced lung fibrosis-continues to have right lower lung mass that are probably playing a role  in his shortness of breath.  Continue steroids/Remdesivir and baricitinib.  Continue supportive care-continue attempts to slowly titrate down FiO2.  No evidence of volume overload-doubt he requires diuretics.    Fever: afebrile O2 requirements:  SpO2: 96 % O2 Flow Rate (L/min): 8 L/min   COVID-19 Labs: Recent Labs    02/15/20 1243 02/15/20 1426 02/15/20 2321 02/17/20 0424 02/18/20 0141  DDIMER   < >  --  2.77* 1.61* 1.74*  FERRITIN   < >  --  1,293* 1,329* 1,425*  LDH  --  283*  --   --   --   CRP   < >  --  7.5* 6.2* 2.5*   < > = values in this interval not displayed.       Component Value Date/Time   BNP 118.2 (H) 02/15/2020 1243    Recent Labs  Lab 02/15/20 1426  PROCALCITON 0.11    Lab Results  Component Value Date   SARSCOV2NAA POSITIVE (A) 02/07/2020   SARSCOV2NAA NEGATIVE 02/03/2020   [place] NEGATIVE 12/20/2019   [place] NEGATIVE 12/14/2019     Prone/Incentive Spirometry: encouraged  incentive spirometry use 3-4/hour.   COPD: No evidence of flare-continue bronchodilators  Chronic diastolic heart failure: Euvolemic-follow volume status closely.  History of prior CVA/carotid endarterectomy: Continue Plavix.  Stage IIIb  adenocarcinoma of the lung: Claims recently completed RTX at [facility] 6 weeks back-before that he was on chemo at the [facility] hospital.  CT chest continues to show right lower lobe mass.  Follow with oncology post discharge. However per spouse on 11/2-patient has made up his mind not to pursue any further treatment for this cancer.  Incidental finding of right rib fractures: Supportive care.  Does not have significant pain-apparently did fall off his bed (soft fall per wife) approximately a week back-per spouse-she was told that he is going to have weak ribs due to radiation.  Schizophrenia: Appears stable-continue thiothixene   Palliative care/goals of care discussion: Understands that he is severely weak with extensive parenchymal lung injury due to COVID-19 pneumonia-he reconfirms that he does not want to be intubated in case he worsens-but does want to pursue CPR for arrhythmias etc. He understands that he remains at significant risk for further worsening in spite of aggressive medical care-given his underlying comorbidities.  Spouse aware of tenuous clinical situation as well.   ABG:    Component Value Date/Time   PHART 7.477 (H) 02/15/2020 1314   PCO2ART 26.3 (L) 02/15/2020 1314   PO2ART 114 (H) 02/15/2020 1314   HCO3 19.4 (L) 02/15/2020 1314   TCO2 20 (L) 02/15/2020 1314   ACIDBASEDEF 2.0 02/15/2020 1314   O2SAT 99.0 02/15/2020 1314    Vent Settings: N/A    Condition -Guarded  Family Communication  : Spouse-Ruth-[phone]-on 11/2   Code Status :  DNI  Diet :  Diet Order            Diet Heart Room service appropriate? Yes; Fluid consistency: Thin  Diet effective now                  Disposition Plan  :   Status is: Inpatient  Remains inpatient appropriate because:Inpatient level of care appropriate due to severity of illness  Dispo: The patient is from: Home              Anticipated d/c is to: Home              Anticipated d/c date is: > 3 days  Patient currently is not medically stable to d/c.   Barriers to discharge: Hypoxia requiring O2 supplementation  Antimicorbials  :    Anti-infectives (From admission, onward)   Start     Dose/Rate Route Frequency Ordered Stop   02/16/20 1000  ceFEPIme (MAXIPIME) 2 g in sodium chloride 0.9 % 100 mL IVPB  Status:  Discontinued        2 g 200 mL/hr over 30 Minutes Intravenous Every 12 hours 02/15/20 1951 02/16/20 1105   02/16/20 0800  vancomycin (VANCOREADY) IVPB 500 mg/100 mL  Status:  Discontinued        500 mg 100 mL/hr over 60 Minutes Intravenous Every 12 hours 02/15/20 1844 02/16/20 1105   02/15/20 1845  ceFEPIme (MAXIPIME) 2 g in sodium chloride 0.9 % 100 mL IVPB        2 g 200 mL/hr over 30 Minutes Intravenous  Once 02/15/20 1839 02/15/20 1949   02/15/20 1845  vancomycin (VANCOREADY) IVPB 1500 mg/300 mL        1,500 mg 150 mL/hr over 120 Minutes Intravenous  Once 02/15/20 1844 02/15/20 2152      Inpatient Medications  Scheduled Meds: . vitamin C  500 mg Oral Daily  . baricitinib  4 mg Oral Daily  . benzonatate  200 mg Oral TID  . clopidogrel  75 mg Oral QHS  . enoxaparin (LOVENOX) injection  40 mg Subcutaneous Q24H  . feeding supplement  237 mL Oral BID BM  . gemfibrozil  600 mg Oral BID AC  . guaiFENesin  600 mg Oral BID  . mouth rinse  15 mL Mouth Rinse BID  . methylPREDNISolone (SOLU-MEDROL) injection  60 mg Intravenous Q12H  . pantoprazole  40 mg Oral Daily  . rosuvastatin  40 mg Oral Daily  . sodium chloride flush  3 mL Intravenous Q12H  . thiothixene  15 mg Oral QHS  . zinc sulfate  220 mg Oral Daily   Continuous Infusions: . lactated ringers Stopped (02/16/20 1900)   PRN Meds:.acetaminophen, albuterol, alum & mag hydroxide-simeth, chlorpheniramine-HYDROcodone, HYDROcodone-acetaminophen, morphine injection, ondansetron **OR** ondansetron (ZOFRAN) IV, sodium chloride   Time Spent in minutes  25  See all Orders from today for further details   Oren Binet M.D on 02/18/2020 at 11:52 AM  To page go to [email or web address] - use universal password  [facility] -  Office  [phone]    Objective:   Vitals:   02/18/20 0001 02/18/20 0400 02/18/20 0830 02/18/20 1134  BP: 98/76 (!) 87/57 94/70 108/76  Pulse: 87 75 89 76  Resp: 20 19 20  (!) 23  Temp: 98.3 F (36.8 C) 98.4 F (36.9 C) 98.1 F (36.7 C) 98.4 F (36.9 C)  TempSrc: Oral Oral Oral Axillary  SpO2: 94% 95% 92% 96%  Weight:      Height:        Wt Readings from Last 3 Encounters:  02/16/20 67 kg  02/08/20 67.1 kg  02/03/20 69.7 kg     Intake/Output Summary (Last 24 hours) at 02/18/2020 1152 Last data filed at 02/18/2020 1035 Gross per 24 hour  Intake 498 ml  Output 925 ml  Net -427 ml     Physical Exam Gen Exam:Alert awake-not in any distress HEENT:atraumatic, normocephalic Chest: B/L clear to auscultation anteriorly CVS:S1S2 regular Abdomen:soft non tender, non distended Extremities:no edema Neurology: Non focal Skin: no rash   Data Review:    CBC Recent Labs  Lab 02/15/20 1243 02/15/20 1314 02/15/20 2321 02/17/20 0424 02/18/20 0141  WBC 9.2  --  5.6 5.2 11.4*  HGB 16.9 15.3 12.9* 11.7* 11.7*  HCT 52.6* 45.0 39.7 35.5* 34.7*  PLT 308  --  188 188 186  MCV 101.9*  --  101.5* 99.4 97.5  MCH 32.8  --  33.0 32.8 32.9  MCHC 32.1  --  32.5 33.0 33.7  RDW 14.4  --  14.4 13.8 13.6  LYMPHSABS 0.7  --  0.3* 0.3* 0.2*  MONOABS 1.3*  --  0.7 0.2 0.4  EOSABS 0.0  --  0.0 0.0 0.0  BASOSABS 0.1  --  0.0 0.0 0.0    Chemistries  Recent Labs  Lab 02/15/20 1314 02/15/20 1426 02/15/20 2321 02/17/20 0424 02/18/20 0141  NA 137 136 132* 136 135  K 3.9 3.9 3.8 4.2 4.3  CL  --  102 104 106 103  CO2  --  23 21* 21* 23  GLUCOSE  --  114* 95 169* 176*  BUN  --  33* 27* 22 26*  CREATININE  --  1.23 0.98 0.91 0.90  CALCIUM  --  8.9 8.0* 8.3* 8.7*  MG  --   --  1.9 2.1 1.9  AST  --  31 24 23 29   ALT  --  17 16 14 16   ALKPHOS  --  72 60 54 56   BILITOT  --  1.2 1.0 0.8 0.6   ------------------------------------------------------------------------------------------------------------------ Recent Labs    02/15/20 1243  TRIG 159*    Lab Results  Component Value Date   HGBA1C 5.1 05/01/2017   ------------------------------------------------------------------------------------------------------------------ No results for input(s): TSH, T4TOTAL, T3FREE, THYROIDAB in the last 72 hours.  Invalid input(s): FREET3 ------------------------------------------------------------------------------------------------------------------ Recent Labs    02/17/20 0424 02/18/20 0141  FERRITIN 1,329* 1,425*    Coagulation profile No results for input(s): INR, PROTIME in the last 168 hours.  Recent Labs    02/17/20 0424 02/18/20 0141  DDIMER 1.61* 1.74*    Cardiac Enzymes No results for input(s): CKMB, TROPONINI, MYOGLOBIN in the last 168 hours.  Invalid input(s): CK ------------------------------------------------------------------------------------------------------------------    Component Value Date/Time   BNP 118.2 (H) 02/15/2020 1243    Micro Results Recent Results (from the past 240 hour(s))  Blood Culture (routine x 2)     Status: None (Preliminary result)   Collection Time: 02/15/20 12:45 PM   Specimen: BLOOD LEFT FOREARM  Result Value Ref Range Status   Specimen Description BLOOD LEFT FOREARM  Final   Special Requests   Final    BOTTLES DRAWN AEROBIC AND ANAEROBIC Blood Culture results may not be optimal due to an inadequate volume of blood received in culture bottles   Culture   Final    NO GROWTH 3 DAYS Performed at [facility], [place] [address], [place], [place] [address]    Report Status PENDING  Incomplete  Blood Culture (routine x 2)     Status: None (Preliminary result)   Collection Time: 02/15/20  1:16 PM   Specimen: BLOOD  Result Value Ref Range Status   Specimen Description BLOOD RIGHT  ANTECUBITAL  Final   Special Requests   Final    BOTTLES DRAWN AEROBIC AND ANAEROBIC Blood Culture results may not be optimal due to an inadequate volume of blood received in culture bottles   Culture   Final    NO GROWTH 3 DAYS Performed at [facility], [place] [address] [place], [place] [address]    Report Status PENDING  Incomplete  MRSA PCR Screening  Status: None   Collection Time: 02/15/20  8:12 PM   Specimen: Nasal Mucosa; Nasopharyngeal  Result Value Ref Range Status   MRSA by PCR NEGATIVE NEGATIVE Final    Comment:        The GeneXpert MRSA Assay (FDA approved for NASAL specimens only), is one component of a comprehensive MRSA colonization surveillance program. It is not intended to diagnose MRSA infection nor to guide or monitor treatment for MRSA infections. Performed at [facility], [place] [address], [place], [place] [address]     Radiology Reports DG Chest 2 View  Result Date: 02/03/2020 CLINICAL DATA:  Hypotension, shortness of breath EXAM: CHEST - 2 VIEW COMPARISON:  12/20/2019 FINDINGS: Patchy airspace disease in the lower lobes concerning for pneumonia. Heart is normal size. No effusions or pneumothorax. No acute bony abnormality. IMPRESSION: Patchy bilateral lower lobe airspace opacities concerning for pneumonia. Electronically Signed   By: Rolm Baptise M.D.   On: 02/03/2020 17:42   CT Angio Chest PE W and/or Wo Contrast  Result Date: 02/15/2020 CLINICAL DATA:  Worsening shortness of breath. EXAM: CT ANGIOGRAPHY CHEST WITH CONTRAST TECHNIQUE: Multidetector CT imaging of the chest was performed using the standard protocol during bolus administration of intravenous contrast. Multiplanar CT image reconstructions and MIPs were obtained to evaluate the vascular anatomy. CONTRAST:  62mL OMNIPAQUE IOHEXOL 350 MG/ML SOLN COMPARISON:  CT dated 02/07/2020 FINDINGS: Cardiovascular: Contrast injection is sufficient to demonstrate satisfactory opacification of  the pulmonary arteries to the segmental level. There is no pulmonary embolus or evidence of right heart strain. The size of the main pulmonary artery is normal. Normal heart size with coronary artery calcification. The course and caliber of the aorta are normal. There is mild atherosclerotic calcification. Opacification decreased due to pulmonary arterial phase contrast bolus timing. Mediastinum/Nodes: -- No mediastinal lymphadenopathy. -- No hilar lymphadenopathy. -- No axillary lymphadenopathy. -- No supraclavicular lymphadenopathy. -- Normal thyroid gland where visualized. -  Unremarkable esophagus. Lungs/Pleura: Again noted is a cavitary mass in the right lower lobe similar to prior study. There are persistent ground-glass airspace opacities in the right upper lobe which are essentially stable from prior study. There are worsening peripheral ground-glass airspace opacities in the left lower and left upper lobe. There is no pneumothorax. No large pleural effusion. Upper Abdomen: Contrast bolus timing is not optimized for evaluation of the abdominal organs. The visualized portions of the organs of the upper abdomen are normal. Musculoskeletal: There are subacute right-sided rib fractures involving the seventh through ninth ribs. Review of the MIP images confirms the above findings. IMPRESSION: 1. No evidence for acute pulmonary embolus. 2. Worsening peripheral ground-glass airspace opacities in the left lower and left upper lobe, concerning for pneumonia (viral or bacterial) persistent cavitary mass in the right lower lobe. 3. Subacute right-sided rib fractures involving the seventh through ninth ribs. Aortic Atherosclerosis (ICD10-I70.0). Electronically Signed   By: Constance Holster M.D.   On: 02/15/2020 17:36   CT Angio Chest PE W and/or Wo Contrast  Result Date: 02/07/2020 CLINICAL DATA:  Positive D-dimer, shortness of breath, tachycardia. EXAM: CT ANGIOGRAPHY CHEST WITH CONTRAST TECHNIQUE: Multidetector  CT imaging of the chest was performed using the standard protocol during bolus administration of intravenous contrast. Multiplanar CT image reconstructions and MIPs were obtained to evaluate the vascular anatomy. CONTRAST:  123mL OMNIPAQUE IOHEXOL 350 MG/ML SOLN COMPARISON:  12/20/2019 FINDINGS: Cardiovascular: No filling defects in the pulmonary arteries to suggest pulmonary emboli. Heart is normal size. Aortic and coronary artery calcifications.  No aneurysm. Mediastinum/Nodes: Stable borderline sized right hilar lymph node. Scattered small and borderline sized mediastinal lymph nodes, stable. Trachea and esophagus are unremarkable. Thyroid unremarkable. Lungs/Pleura: Trace right pleural effusion. Masslike consolidation in the right lower lobe is again noted and is stable since prior study. New rounded masslike area of consolidation in the right upper lobe measuring 2.5 cm. Increasing airspace disease posteriorly and peripherally in the right upper lobe and superior segment of the right lower lobe. Increasing ground-glass airspace opacity posteriorly in the superior segment of the left lower lobe. Paraseptal emphysema. Peripheral reticulation in the lungs bilaterally compatible with fibrosis. None none Upper Abdomen: Imaging into the upper abdomen demonstrates no acute findings. Musculoskeletal: Chest wall soft tissues are unremarkable. No acute bony abnormality. Review of the MIP images confirms the above findings. IMPRESSION: Rounded area of masslike consolidation in the right lower lobe with cavitation again noted, unchanged. New airspace disease seen in the right upper lobe and right lower lobe as well as superior segment of the left lower lobe. These areas are concerning for pneumonia. Areas of peripheral interstitial prominence and fibrosis bilaterally. Right hilar and borderline mediastinal lymph nodes are stable. Coronary artery disease. Aortic Atherosclerosis (ICD10-I70.0). Electronically Signed   By:  Rolm Baptise M.D.   On: 02/07/2020 20:24   DG Chest Port 1 View  Result Date: 02/15/2020 CLINICAL DATA:  Cough, worsening dyspnea. EXAM: PORTABLE CHEST 1 VIEW COMPARISON:  02/07/2020 FINDINGS: Improved aeration at the right lung base compared to the prior examination. Interstitial thickening and chronic changes at the left lung base are unchanged. Heart and mediastinum are grossly stable. IMPRESSION: 1. Improved aeration at the right lung base compared to the recent comparison examination. 2. Chronic changes. Electronically Signed   By: Markus Daft M.D.   On: 02/15/2020 13:16   DG Chest Portable 1 View  Result Date: 02/07/2020 CLINICAL DATA:  Shortness of breath, cough, congestion EXAM: PORTABLE CHEST 1 VIEW COMPARISON:  02/03/2020 FINDINGS: Airspace disease in the right mid and lower lung again noted, most confluent at the right lung base compatible with pneumonia. Heart is normal size. Left lung clear. Previously seen patchy opacities at the left base have resolved. No effusions or acute bony abnormality. IMPRESSION: Continued patchy right mid and lower lung airspace opacities concerning for pneumonia. Clearing of the left basilar opacities since prior study. Electronically Signed   By: Rolm Baptise M.D.   On: 02/07/2020 18:51   VAS [place] LOWER EXTREMITY VENOUS (DVT)  Result Date: 02/10/2020  Lower Venous DVT Study Indications: Elevated Ddimer.  Risk Factors: COVID 19 positive. Comparison Study: No prior studies. Performing Technologist: Oliver Hum RVT  Examination Guidelines: A complete evaluation includes B-mode imaging, spectral Doppler, color Doppler, and power Doppler as needed of all accessible portions of each vessel. Bilateral testing is considered an integral part of a complete examination. Limited examinations for reoccurring indications may be performed as noted. The reflux portion of the exam is performed with the patient in reverse Trendelenburg.   +---------+---------------+---------+-----------+----------+--------------+ RIGHT    CompressibilityPhasicitySpontaneityPropertiesThrombus Aging +---------+---------------+---------+-----------+----------+--------------+ CFV      Full           Yes      Yes                                 +---------+---------------+---------+-----------+----------+--------------+ SFJ      Full                                                        +---------+---------------+---------+-----------+----------+--------------+  FV Prox  Full                                                        +---------+---------------+---------+-----------+----------+--------------+ FV Mid   Full                                                        +---------+---------------+---------+-----------+----------+--------------+ FV DistalFull                                                        +---------+---------------+---------+-----------+----------+--------------+ PFV      Full                                                        +---------+---------------+---------+-----------+----------+--------------+ POP      Full           Yes      Yes                                 +---------+---------------+---------+-----------+----------+--------------+ PTV      Full                                                        +---------+---------------+---------+-----------+----------+--------------+ PERO     Full                                                        +---------+---------------+---------+-----------+----------+--------------+   +---------+---------------+---------+-----------+----------+--------------+ LEFT     CompressibilityPhasicitySpontaneityPropertiesThrombus Aging +---------+---------------+---------+-----------+----------+--------------+ CFV      Full           Yes      Yes                                  +---------+---------------+---------+-----------+----------+--------------+ SFJ      Full                                                        +---------+---------------+---------+-----------+----------+--------------+ FV Prox  Full                                                        +---------+---------------+---------+-----------+----------+--------------+  FV Mid   Full                                                        +---------+---------------+---------+-----------+----------+--------------+ FV DistalFull                                                        +---------+---------------+---------+-----------+----------+--------------+ PFV      Full                                                        +---------+---------------+---------+-----------+----------+--------------+ POP      Full           Yes      Yes                                 +---------+---------------+---------+-----------+----------+--------------+ PTV      Full                                                        +---------+---------------+---------+-----------+----------+--------------+ PERO     Full                                                        +---------+---------------+---------+-----------+----------+--------------+     Summary: RIGHT: - There is no evidence of deep vein thrombosis in the lower extremity.  - No cystic structure found in the popliteal fossa.  LEFT: - There is no evidence of deep vein thrombosis in the lower extremity.  - No cystic structure found in the popliteal fossa.  *See table(s) above for measurements and observations. Electronically signed by Ruta Hinds MD on 02/10/2020 at 6:25:28 PM.    Final    US Abdomen Limited RUQ (LIVER/GB)  Result Date: 02/15/2020 CLINICAL DATA:  Right upper quadrant abdominal pain. History of prior cholecystectomy. EXAM: ULTRASOUND ABDOMEN LIMITED RIGHT UPPER QUADRANT COMPARISON:  None. FINDINGS:  Gallbladder: The gallbladder is surgically absent. Common bile duct: Diameter: 4.9 mm Liver: No focal lesion identified. Within normal limits in parenchymal echogenicity. Portal vein is patent on color Doppler imaging with normal direction of blood flow towards the liver. Other: The study is limited secondary to overlying bowel gas. IMPRESSION: 1. Findings consistent with prior cholecystectomy. 2. Otherwise, unremarkable right upper quadrant ultrasound. Electronically Signed   By: Virgina Norfolk M.D.   On: 02/15/2020 21:02

## 2020-02-18 NOTE — Progress Notes (Signed)
Physical Therapy Treatment Patient Details Name: Melvin Foley MRN: [id number] DOB: Nov 08, 1945 Today's Date: 02/18/2020    History of Present Illness 74 y.o. male with medical history significant of lung cancer (finished radiation 6 weeks ago), COPD, chronic diastolic CHF, CVA, schizophrenia, presented to ED 10/30 with known COVID infection (+10/22 with hospitalization 10/22-10/25) and worsening dyspnea. CTA negative for PE    PT Comments    Patient in bed on arrival on 7L with sats 97% HR 102. Overall moves with minguard assist (for lines and safety with RW) with sats at lowest 83% on 7L O2 and max HR 113. Able to walk up to 25 ft with RW. Did not show carryover of technique for pursed lip breathing or correct use of IS from yesterday's session.     Follow Up Recommendations  Home health PT;Supervision/Assistance - 24 hour     Equipment Recommendations  None recommended by PT    Recommendations for Other Services       Precautions / Restrictions Precautions Precautions: Fall    Mobility  Bed Mobility Overal bed mobility: Needs Assistance Bed Mobility: Sidelying to Sit   Sidelying to sit: Min guard       General bed mobility comments: Min guard A for safety. No physical A needed  Transfers Overall transfer level: Needs assistance Equipment used: Rolling walker (2 wheeled);None Transfers: Sit to/from Stand Sit to Stand: Min guard         General transfer comment: vc for safe hand placement  Ambulation/Gait Ambulation/Gait assistance: Min guard Gait Distance (Feet): 12 Feet (seated rest x 5 minutes; 25 ft) Assistive device: Rolling walker (2 wheeled) Gait Pattern/deviations: Step-through pattern;Decreased stride length;Trunk flexed Gait velocity: slow   General Gait Details: better maneuvering thru tight spaces and no imbalance    Stairs             Wheelchair Mobility    Modified Rankin (Stroke Patients Only)       Balance Overall  balance assessment: Needs assistance Sitting-balance support: No upper extremity supported;Feet supported Sitting balance-Leahy Scale: Good     Standing balance support: No upper extremity supported Standing balance-Leahy Scale: Fair                              Cognition Arousal/Alertness: Awake/alert Behavior During Therapy: Flat affect Overall Cognitive Status: No family/caregiver present to determine baseline cognitive functioning                                 General Comments: slow processing: ?[facility]      Exercises Other Exercises Other Exercises: Use of IS. Pt initially trying to use while coughing and educated he needs to stop coughing and slow his breathing before use. Able to pull 750 ml Other Exercises: Educated on pursed lip breathing with pt demonstrating fair technique and required cues to utilize when sats drop "whenever you hear the bell ringing" Other Exercises: sit to stand x 5 reps using UEs on armrests to assist    General Comments        Pertinent Vitals/Pain Pain Assessment: No/denies pain    Home Living                      Prior Function            PT Goals (current goals can now be found in the  care plan section) Acute Rehab PT Goals Patient Stated Goal: go home ASAP Time For Goal Achievement: 03/02/20 Potential to Achieve Goals: Good Progress towards PT goals: Progressing toward goals    Frequency    Min 3X/week      PT Plan Current plan remains appropriate    Co-evaluation              AM-PAC PT "6 Clicks" Mobility   Outcome Measure  Help needed turning from your back to your side while in a flat bed without using bedrails?: None Help needed moving from lying on your back to sitting on the side of a flat bed without using bedrails?: None Help needed moving to and from a bed to a chair (including a wheelchair)?: A Little Help needed standing up from a chair using your arms (e.g., wheelchair  or bedside chair)?: A Little Help needed to walk in hospital room?: A Little Help needed climbing 3-5 steps with a railing? : A Little 6 Click Score: 20    End of Session Equipment Utilized During Treatment: Oxygen;Gait belt Activity Tolerance: Patient limited by fatigue;Treatment limited secondary to medical complications (Comment) (decr sats) Patient left: in chair;with call bell/phone within reach;with chair alarm set   PT Visit Diagnosis: Difficulty in walking, not elsewhere classified (R26.2);Muscle weakness (generalized) (M62.81)     Time: 6333-5456 PT Time Calculation (min) (ACUTE ONLY): 45 min  Charges:  $Gait Training: 23-37 mins $Therapeutic Exercise: 8-22 mins                      Arby Barrette, PT Pager [phone]    Rexanne Mano 02/18/2020, 1:11 PM

## 2020-02-18 NOTE — Care Management (Addendum)
LVM w April Alexander at the [facility] to notify of admission.  Spoke w rep from [place]. PCP is Dr Edison Nasuti FAX DC notes and [facility] orders to [phone]  CSW is Cyndee Brightly [phone] ext [phone] Pager [phone]  [facility].

## 2020-02-18 NOTE — Progress Notes (Signed)
Initial Nutrition Assessment  DOCUMENTATION CODES:   Not applicable  INTERVENTION:  Increase Ensure Enlive po TID, each supplement provides 350 kcal and 20 grams of protein  Magic cup BID with meals, each supplement provides 290 kcal and 9 grams of protein  Downgrade to DYS 3 diet (chopped meats) for ease of intake    NUTRITION DIAGNOSIS:   Increased nutrient needs related to cancer and cancer related treatments, acute illness (pneumonitis secondary to COVID-19 virus; stage IIIb lung cancer s/p radiation) as evidenced by estimated needs.   GOAL:   Patient will meet greater than or equal to 90% of their needs    MONITOR:   PO intake, Weight trends, Labs, I & O's, Supplement acceptance  REASON FOR ASSESSMENT:   Malnutrition Screening Tool    ASSESSMENT:  RD working remotely.  74 year old male with history significant of stage IIIb lung cancer s/p radiation followed by [facility] and [place], schizophrenia, dCHF, GERD, HTN, OSA, stroke, Bipolar 1 disorder, carotid stenosis, who was recently admitted 10/22-10/25 due to COVID-19 infection presented to ED with shortness of breath. He was found to have severe hypoxic respiratory failure due to ongoing COVID-19 pneumonitis.  Attempted to contact pt via phone this afternoon, however no answer. Noted slowly improving, down to 8 L from 12 L O2 today, ongoing shortness of breath with minimal activity and coughing spells. Per chart, pt experiencing diarrhea secondary to Covid infection during last admission. He was accepting of Ensure, Hormel Shakes, and Magic Cup supplements. Per flowsheets, he consumed 25% of breakfast on 10/31 and 80% of breakfast this morning. He is drinking Ensure supplement BID per medications.  Per chart, weights have trended down ~16 lbs (9.8%) in the last 5.5 months; significant. Given advanced age, multiple co-morbidities, as well as recent hospitalizations suspect degree of acute malnutrition, however unable to  identify at this time. Will increase Ensure to TID, provide Magic Cup on meal trays, as well as downgrade diet to dysphagia 3 (chopped meats) for ease of intake. Will plan to complete exam at follow-up.  Medications reviewed and include: Vitamin C, Protonix, Zinc sulfate Labs: CBGs 152,183,197 x 24 hrs, BUN 26 (H), WBC 11.4 (H)   NUTRITION - FOCUSED PHYSICAL EXAM: Unable to complete at this time, RD working remotely.   Diet Order:   Diet Order            Diet Heart Room service appropriate? Yes; Fluid consistency: Thin  Diet effective now                 EDUCATION NEEDS:   Not appropriate for education at this time  Skin:  Skin Assessment: Reviewed RN Assessment  Last BM:  10/30  Height:   Ht Readings from Last 1 Encounters:  02/16/20 5\' 8"  (1.727 m)    Weight:   Wt Readings from Last 1 Encounters:  02/16/20 67 kg    BMI:  Body mass index is 22.46 kg/m.  Estimated Nutritional Needs:   Kcal:  6644-0347  Protein:  110-125  Fluid:  > 2L/day   Lajuan Lines, RD, LDN Clinical Nutrition After Hours/Weekend Pager # in [place]

## 2020-02-19 LAB — COMPREHENSIVE METABOLIC PANEL
ALT: 17 U/L (ref 0–44)
AST: 25 U/L (ref 15–41)
Albumin: 2.3 g/dL — ABNORMAL LOW (ref 3.5–5.0)
Alkaline Phosphatase: 57 U/L (ref 38–126)
Anion gap: 9 (ref 5–15)
BUN: 28 mg/dL — ABNORMAL HIGH (ref 8–23)
CO2: 23 mmol/L (ref 22–32)
Calcium: 8.6 mg/dL — ABNORMAL LOW (ref 8.9–10.3)
Chloride: 102 mmol/L (ref 98–111)
Creatinine, Ser: 0.82 mg/dL (ref 0.61–1.24)
GFR, Estimated: >60 mL/min (ref >60)
Glucose, Bld: 139 mg/dL — ABNORMAL HIGH (ref 70–99)
Potassium: 4.3 mmol/L (ref 3.5–5.1)
Sodium: 134 mmol/L — ABNORMAL LOW (ref 135–145)
Total Bilirubin: 0.9 mg/dL (ref 0.3–1.2)
Total Protein: 6.2 g/dL — ABNORMAL LOW (ref 6.5–8.1)

## 2020-02-19 LAB — CBC WITH DIFFERENTIAL/PLATELET
Abs Immature Granulocytes: 0.04 10*3/uL (ref 0.00–0.07)
Basophils Absolute: 0 10*3/uL (ref 0.0–0.1)
Basophils Relative: 0 %
Eosinophils Absolute: 0 10*3/uL (ref 0.0–0.5)
Eosinophils Relative: 0 %
HCT: 35 % — ABNORMAL LOW (ref 39.0–52.0)
Hemoglobin: 11.9 g/dL — ABNORMAL LOW (ref 13.0–17.0)
Immature Granulocytes: 1 %
Lymphocytes Relative: 3 %
Lymphs Abs: 0.2 10*3/uL — ABNORMAL LOW (ref 0.7–4.0)
MCH: 33.6 pg (ref 26.0–34.0)
MCHC: 34 g/dL (ref 30.0–36.0)
MCV: 98.9 fL (ref 80.0–100.0)
Monocytes Absolute: 0.3 10*3/uL (ref 0.1–1.0)
Monocytes Relative: 4 %
Neutro Abs: 8.3 10*3/uL — ABNORMAL HIGH (ref 1.7–7.7)
Neutrophils Relative %: 92 %
Platelets: 195 10*3/uL (ref 150–400)
RBC: 3.54 MIL/uL — ABNORMAL LOW (ref 4.22–5.81)
RDW: 13.8 % (ref 11.5–15.5)
WBC: 8.9 10*3/uL (ref 4.0–10.5)
nRBC: 0 % (ref 0.0–0.2)

## 2020-02-19 LAB — C-REACTIVE PROTEIN: CRP: 1.1 mg/dL — ABNORMAL HIGH (ref <1.0)

## 2020-02-19 LAB — MAGNESIUM: Magnesium: 2.1 mg/dL (ref 1.7–2.4)

## 2020-02-19 LAB — GLUCOSE, CAPILLARY
Glucose-Capillary: 173 mg/dL — ABNORMAL HIGH (ref 70–99)
Glucose-Capillary: 176 mg/dL — ABNORMAL HIGH (ref 70–99)

## 2020-02-19 LAB — D-DIMER, QUANTITATIVE: D-Dimer, Quant: 1.54 ug/mL-FEU — ABNORMAL HIGH (ref 0.00–0.50)

## 2020-02-19 LAB — FERRITIN: Ferritin: 1346 ng/mL — ABNORMAL HIGH (ref 24–336)

## 2020-02-19 MED ORDER — FUROSEMIDE 40 MG PO TABS
40.0000 mg | ORAL_TABLET | Freq: Every day | ORAL | Status: DC
  Administered 2020-02-19 – 2020-02-22 (×4): 40 mg via ORAL
  Filled 2020-02-19 (×4): qty 1

## 2020-02-19 MED ORDER — FLUTICASONE FUROATE-VILANTEROL 100-25 MCG/INH IN AEPB
1.0000 | INHALATION_SPRAY | Freq: Every day | RESPIRATORY_TRACT | Status: DC
  Administered 2020-02-19 – 2020-02-27 (×9): 1 via RESPIRATORY_TRACT
  Filled 2020-02-19: qty 28

## 2020-02-19 MED ORDER — OXYMETAZOLINE HCL 0.05 % NA SOLN
3.0000 | Freq: Two times a day (BID) | NASAL | Status: AC
  Administered 2020-02-19 – 2020-02-21 (×6): 3 via NASAL
  Filled 2020-02-19: qty 30

## 2020-02-19 MED ORDER — FLUTICASONE PROPIONATE 50 MCG/ACT NA SUSP
2.0000 | Freq: Every day | NASAL | Status: DC
  Administered 2020-02-19 – 2020-02-28 (×10): 2 via NASAL
  Filled 2020-02-19: qty 16

## 2020-02-19 NOTE — TOC Initial Note (Addendum)
Transition of Care [facility]) - Initial/Assessment Note    Patient Details  Name: Melvin Foley MRN: [id number] Date of Birth: Jul 29, 1945  [facility] CM/SW Contact:    [name], RN Phone Number: 02/19/2020, 11:39 AM  Clinical Narrative:              Melvin Foley w patient. He states that he is active w [facility]. Notified liaison of admission, and verified he is active w PT OT RN. [facility]. LVM w wife [name] requesting callback for further DC planning.  14:30 Spoke with wife. She is agreeable to referral to [facility] palliative services. [facility] following. She was requesting insight to visit patient based on the "No patient left alone" act. She states that she has spoken to [employer] and [employer] and was waiting to hear back more. I checked for any updates to visitation policy, none noted. Wife aware.   She verified she would be able to bring O2 for transport home and provide ride at time of DC.         Expected Discharge Plan: [place] Barriers to Discharge: Continued Medical Work up   Patient Goals and CMS Choice Patient states their goals for this hospitalization and ongoing recovery are:: to go home      Expected Discharge Plan and Services Expected Discharge Plan: [place]   Discharge Planning Services: CM Consult Post Acute Care Choice: [place]: [place] ([facility]) Date [facility] Agency Contacted: 02/19/20 Time HH Agency Contacted: [phone] Representative spoke with at [place]: Junie Panning  Prior Living Arrangements/Services                       Activities of Daily Living Home Assistive Devices/Equipment: Nebulizer, Radio producer (specify quad or straight), Eyeglasses, Grab bars in shower, Environmental consultant (specify type), Hearing aid ADL Screening (condition at time of admission) Patient's cognitive ability adequate to safely complete daily activities?:  Yes Is the patient deaf or have difficulty hearing?: Yes Does the patient have difficulty seeing, even when wearing glasses/contacts?: No Does the patient have difficulty concentrating, remembering, or making decisions?: No Patient able to express need for assistance with ADLs?: Yes Does the patient have difficulty dressing or bathing?: No Independently performs ADLs?: Yes (appropriate for developmental age) Does the patient have difficulty walking or climbing stairs?: Yes Weakness of Legs: None Weakness of Arms/Hands: None  Permission Sought/Granted                  Emotional Assessment              Admission diagnosis:  RUQ abdominal pain [R10.11] Acute respiratory failure with hypoxia ([place]) [J96.01] Pneumonia due to COVID-19 virus [U07.1, J12.82] Patient Active Problem List   Diagnosis Date Noted  . Acute respiratory failure with hypoxia ([place]) 02/15/2020  . Pneumonia due to COVID-19 virus 02/15/2020  . Sepsis ([place]) 02/15/2020  . Closed rib fracture 02/15/2020  . Chronic diastolic CHF (congestive heart failure) ([place]) 02/08/2020  . COPD (chronic obstructive pulmonary disease) ([place]) 02/08/2020  . Acute respiratory disease due to COVID-19 virus 02/07/2020  . 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 Fever 12/21/2019  . 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 Schizophrenia ([place])  12/21/2019  . Bipolar disorder ([place]) 12/21/2019  . Neutropenia ([place]) 12/15/2019  . Neutropenic fever ([place]) 12/14/2019  . Carotid stenosis 05/03/2017  . Difficulty with speech   . TIA (transient ischemic attack) 04/30/2017  . Hyperlipidemia 04/30/2017  . Proximal humerus fracture 03/02/2011   PCP:  Wallie Char, FNP Pharmacy:   [facility], [place] [address] Phone: [phone] Fax: [phone]     Social Determinants of Health (SDOH) Interventions    Readmission Risk  Interventions No flowsheet data found.

## 2020-02-19 NOTE — Progress Notes (Signed)
OT Cancellation Note  Patient Details Name: Melvin Foley MRN: [id number] DOB: Apr 22, 1945   Cancelled Treatment:    Reason Eval/Treat Not Completed: Patient declined, no reason specified (Pt asleep in room, awoke and denied OT treatment at this time despite encouragement and education. Will continue to follow per POC)  Zenovia Jarred, MSOT, OTR/L [place] [facility] Office Number: [phone] Pager: [phone]  Zenovia Jarred 02/19/2020, 5:30 PM

## 2020-02-19 NOTE — Progress Notes (Signed)
PROGRESS NOTE                                                                                                                                                                                                             Patient Demographics:    Melvin Foley, is a 74 y.o. male, DOB - [birth year], EZM:[id number]  Outpatient Primary MD for the patient is Wallie Char, FNP   Admit date - 02/15/2020   LOS - 4  Chief Complaint  Patient presents with  . Covid Positive  . Pneumonia       Brief Narrative: Patient is a 74 y.o. male with PMHx of stage IIIb adenocarcinoma-finished radiation approximately 6 weeks back (follows at [place] system)-recently hospitalized from 10/22-10/25 COVID-19 pneumonia-did not require O2 on discharge-presented back to the ED on 10/30 with shortness of breath-found to have severe hypoxic respiratory failure due to ongoing COVID-19 pneumonitis.  COVID-19 vaccinated status: Unvaccinated   Significant Events: 10/18>> evaluated at [facility] for hypotension/possible pneumonia-refused admission-Covid 19 negative 10/22-10/25>> hospitalization for Covid-discharged on room air. 10/30>> readmitted with severe hypoxemia-due to ongoing COVID-19 pneumonitis.  Significant studies: 10/22>>Chest x-ray: Patchy right mid/lower lung opacities concerning for pneumonia 10/22>> CTA chest: New airspace disease in the right upper/right lower lobe/left lower lobe-rounded area of masslike consolidation in the right lower lobe with cavitation again noted. 10/31>> CTA chest: No PE, worsening peripheral groundglass opacities, subacute right sided rib fractures along the seventh through ninth ribs.  COVID-19 medications: Steroids: 10/22>> Remdesivir: 10/22>>10/26 Baricitinib: 10/31>>  Antibiotics: Vancomycin: 10/30>> 10/31 Cefepime: 10/30>> 10/31  Microbiology data: 10/22 >>blood culture: No growth 10/30>>  blood culture: No growth  Procedures: None  Consults: None  DVT prophylaxis: enoxaparin (LOVENOX) injection 40 mg Start: 02/15/20 2300    Subjective:   Some mild epistaxis this morning-when I saw him this morning-he was lying comfortably-he was able to be titrated down to 5 L.  Per nursing staff-he gets tachypneic with just moving out of bed.   Assessment  & Plan :   Acute hypoxic respiratory failure secondary to Covid 19 Viral pneumonia: Overall oxygen requirements have slowly started to decrease-at one point he was on 12 L of HFNC-currently down to 5 L at rest.  He continues to complain of significant exertional dyspnea with just minimal mobility.  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  Plans are to continue steroids/remdesivir baricitinib-and attempt to slowly titrate down FiO2.  Although he does not have any overt signs of volume overload-we will go ahead and start him on some Lasix in order to see if this improves exertional dyspnea-and to maintain negative balance.  Fever: afebrile O2 requirements:  SpO2: 100 % O2 Flow Rate (L/min): 8 L/min   COVID-19 Labs: Recent Labs    02/17/20 0424 02/18/20 0141 02/19/20 0054  DDIMER 1.61* 1.74* 1.54*  FERRITIN 1,329* 1,425* 1,346*  CRP 6.2* 2.5* 1.1*       Component Value Date/Time   BNP 118.2 (H) 02/15/2020 1243    Recent Labs  Lab 02/15/20 1426  PROCALCITON 0.11    Lab Results  Component Value Date   SARSCOV2NAA POSITIVE (A) 02/07/2020   SARSCOV2NAA NEGATIVE 02/03/2020   [place] NEGATIVE 12/20/2019   [place] NEGATIVE 12/14/2019     Prone/Incentive Spirometry: encouraged  incentive spirometry use 3-4/hour.  Epistaxis-appears mild-not actively bleeding this morning-suspect he may have had some bleeding overnight.  Have asked RN to discontinue nasal cannula for a  while-and just place him on NRB and allow bleeding area to heal.  If bleeding worsens-becomes severe-needs ENT eval.  Have also ordered Afrin nasal spray.  COPD: No evidence of flare-continue bronchodilators  Chronic diastolic heart failure: Euvolemic-follow volume status closely.  History of prior CVA/carotid endarterectomy: Continue Plavix.  Stage IIIb adenocarcinoma of the lung: Claims recently completed RTX at [facility] 6 weeks back-before that he was on chemo at the [facility] hospital.  CT chest continues to show right lower lobe mass.  Follow with oncology post discharge. However per spouse on 11/2-patient has made up his mind not to pursue any further treatment for this cancer.  Incidental finding of right rib fractures: Supportive care.  Does not have significant pain-apparently did fall off his bed (soft fall per wife) approximately a week back-per spouse-she was told that he is going to have weak ribs due to radiation.  Schizophrenia: Appears stable-continue thiothixene   Palliative care/goals of care discussion: Understands that he is severely weak with extensive parenchymal lung injury due to COVID-19 pneumonia-he reconfirms that he does not want to be intubated in case he worsens-but does want to pursue CPR for arrhythmias etc. He understands that he remains at significant risk for further worsening in spite of aggressive medical care-given his underlying comorbidities.  Spouse aware of tenuous clinical situation as well.   ABG:    Component Value Date/Time   PHART 7.477 (H) 02/15/2020 1314   PCO2ART 26.3 (L) 02/15/2020 1314   PO2ART 114 (H) 02/15/2020 1314   HCO3 19.4 (L) 02/15/2020 1314   TCO2 20 (L) 02/15/2020 1314   ACIDBASEDEF 2.0 02/15/2020 1314   O2SAT 99.0 02/15/2020 1314    Vent Settings: N/A    Condition -Guarded  Family Communication  : Spouse-Ruth-[phone]-on 11/3 updated regarding patient's clinical status.  Spouse and daughter inquiring about  visitation-spouse indicates that she will talk with department director-explained that patient needs at least 21 days of isolation-and needs to show clinical improvement in oxygenation before isolation can be discontinued.  Code Status :  DNI  Diet :  Diet Order            DIET DYS 3 Room service appropriate? Yes; Fluid consistency: Thin  Diet effective now  Disposition Plan  :   Status is: Inpatient  Remains inpatient appropriate because:Inpatient level of care appropriate due to severity of illness  Dispo: The patient is from: Home              Anticipated d/c is to: Home              Anticipated d/c date is: > 3 days              Patient currently is not medically stable to d/c.   Barriers to discharge: Hypoxia requiring O2 supplementation  Antimicorbials  :    Anti-infectives (From admission, onward)   Start     Dose/Rate Route Frequency Ordered Stop   02/16/20 1000  ceFEPIme (MAXIPIME) 2 g in sodium chloride 0.9 % 100 mL IVPB  Status:  Discontinued        2 g 200 mL/hr over 30 Minutes Intravenous Every 12 hours 02/15/20 1951 02/16/20 1105   02/16/20 0800  vancomycin (VANCOREADY) IVPB 500 mg/100 mL  Status:  Discontinued        500 mg 100 mL/hr over 60 Minutes Intravenous Every 12 hours 02/15/20 1844 02/16/20 1105   02/15/20 1845  ceFEPIme (MAXIPIME) 2 g in sodium chloride 0.9 % 100 mL IVPB        2 g 200 mL/hr over 30 Minutes Intravenous  Once 02/15/20 1839 02/15/20 1949   02/15/20 1845  vancomycin (VANCOREADY) IVPB 1500 mg/300 mL        1,500 mg 150 mL/hr over 120 Minutes Intravenous  Once 02/15/20 1844 02/15/20 2152      Inpatient Medications  Scheduled Meds: . vitamin C  500 mg Oral Daily  . baricitinib  4 mg Oral Daily  . benzonatate  200 mg Oral TID  . clopidogrel  75 mg Oral QHS  . enoxaparin (LOVENOX) injection  40 mg Subcutaneous Q24H  . feeding supplement  237 mL Oral TID BM  . gemfibrozil  600 mg Oral BID AC  . guaiFENesin  600 mg  Oral BID  . mouth rinse  15 mL Mouth Rinse BID  . methylPREDNISolone (SOLU-MEDROL) injection  60 mg Intravenous Q12H  . oxymetazoline  3 spray Each Nare BID  . pantoprazole  40 mg Oral Daily  . rosuvastatin  40 mg Oral Daily  . sodium chloride flush  3 mL Intravenous Q12H  . thiothixene  15 mg Oral QHS  . zinc sulfate  220 mg Oral Daily   Continuous Infusions: . lactated ringers Stopped (02/16/20 1900)   PRN Meds:.acetaminophen, albuterol, alum & mag hydroxide-simeth, chlorpheniramine-HYDROcodone, HYDROcodone-acetaminophen, morphine injection, ondansetron **OR** ondansetron (ZOFRAN) IV, sodium chloride   Time Spent in minutes  25  See all Orders from today for further details   Oren Binet M.D on 02/19/2020 at 2:07 PM  To page go to [email or web address] - use universal password  [facility] -  Office  [phone]    Objective:   Vitals:   02/19/20 0013 02/19/20 0359 02/19/20 0743 02/19/20 1233  BP: 104/76 109/79 (!) 109/91 110/90  Pulse: 68 68 94 (!) 113  Resp: 18 18 (!) 24 (!) 24  Temp: 97.8 F (36.6 C) 98.3 F (36.8 C) 97.9 F (36.6 C) 97.9 F (36.6 C)  TempSrc: Oral Oral Oral Oral  SpO2: 100% 100% 96% 100%  Weight:      Height:        Wt Readings from Last 3 Encounters:  02/16/20 67 kg  02/08/20 67.1 kg  02/03/20 69.7 kg  Intake/Output Summary (Last 24 hours) at 02/19/2020 1407 Last data filed at 02/19/2020 1320 Gross per 24 hour  Intake 300 ml  Output --  Net 300 ml     Physical Exam Gen Exam:Alert awake-not in any distress HEENT:atraumatic, normocephalic Chest: B/L clear to auscultation anteriorly CVS:S1S2 regular Abdomen:soft non tender, non distended Extremities:no edema Neurology: Non focal Skin: no rash   Data Review:    CBC Recent Labs  Lab 02/15/20 1243 02/15/20 1243 02/15/20 1314 02/15/20 2321 02/17/20 0424 02/18/20 0141 02/19/20 0054  WBC 9.2  --   --  5.6 5.2 11.4* 8.9  HGB 16.9   < > 15.3 12.9* 11.7* 11.7* 11.9*   HCT 52.6*   < > 45.0 39.7 35.5* 34.7* 35.0*  PLT 308  --   --  188 188 186 195  MCV 101.9*  --   --  101.5* 99.4 97.5 98.9  MCH 32.8  --   --  33.0 32.8 32.9 33.6  MCHC 32.1  --   --  32.5 33.0 33.7 34.0  RDW 14.4  --   --  14.4 13.8 13.6 13.8  LYMPHSABS 0.7  --   --  0.3* 0.3* 0.2* 0.2*  MONOABS 1.3*  --   --  0.7 0.2 0.4 0.3  EOSABS 0.0  --   --  0.0 0.0 0.0 0.0  BASOSABS 0.1  --   --  0.0 0.0 0.0 0.0   < > = values in this interval not displayed.    Chemistries  Recent Labs  Lab 02/15/20 1426 02/15/20 2321 02/17/20 0424 02/18/20 0141 02/19/20 0054  NA 136 132* 136 135 134*  K 3.9 3.8 4.2 4.3 4.3  CL 102 104 106 103 102  CO2 23 21* 21* 23 23  GLUCOSE 114* 95 169* 176* 139*  BUN 33* 27* 22 26* 28*  CREATININE 1.23 0.98 0.91 0.90 0.82  CALCIUM 8.9 8.0* 8.3* 8.7* 8.6*  MG  --  1.9 2.1 1.9 2.1  AST 31 24 23 29 25   ALT 17 16 14 16 17   ALKPHOS 72 60 54 56 57  BILITOT 1.2 1.0 0.8 0.6 0.9   ------------------------------------------------------------------------------------------------------------------ No results for input(s): CHOL, HDL, LDLCALC, TRIG, CHOLHDL, LDLDIRECT in the last 72 hours.  Lab Results  Component Value Date   HGBA1C 5.1 05/01/2017   ------------------------------------------------------------------------------------------------------------------ No results for input(s): TSH, T4TOTAL, T3FREE, THYROIDAB in the last 72 hours.  Invalid input(s): FREET3 ------------------------------------------------------------------------------------------------------------------ Recent Labs    02/18/20 0141 02/19/20 0054  FERRITIN 1,425* 1,346*    Coagulation profile No results for input(s): INR, PROTIME in the last 168 hours.  Recent Labs    02/18/20 0141 02/19/20 0054  DDIMER 1.74* 1.54*    Cardiac Enzymes No results for input(s): CKMB, TROPONINI, MYOGLOBIN in the last 168 hours.  Invalid input(s):  CK ------------------------------------------------------------------------------------------------------------------    Component Value Date/Time   BNP 118.2 (H) 02/15/2020 1243    Micro Results Recent Results (from the past 240 hour(s))  Blood Culture (routine x 2)     Status: None (Preliminary result)   Collection Time: 02/15/20 12:45 PM   Specimen: BLOOD LEFT FOREARM  Result Value Ref Range Status   Specimen Description BLOOD LEFT FOREARM  Final   Special Requests   Final    BOTTLES DRAWN AEROBIC AND ANAEROBIC Blood Culture results may not be optimal due to an inadequate volume of blood received in culture bottles   Culture   Final    NO GROWTH 4 DAYS Performed at  [facility], [place] [address], [place], [place] [address]    Report Status PENDING  Incomplete  Blood Culture (routine x 2)     Status: None (Preliminary result)   Collection Time: 02/15/20  1:16 PM   Specimen: BLOOD  Result Value Ref Range Status   Specimen Description BLOOD RIGHT ANTECUBITAL  Final   Special Requests   Final    BOTTLES DRAWN AEROBIC AND ANAEROBIC Blood Culture results may not be optimal due to an inadequate volume of blood received in culture bottles   Culture   Final    NO GROWTH 4 DAYS Performed at [facility], [place] [address], [place], [place] [address]    Report Status PENDING  Incomplete  MRSA PCR Screening     Status: None   Collection Time: 02/15/20  8:12 PM   Specimen: Nasal Mucosa; Nasopharyngeal  Result Value Ref Range Status   MRSA by PCR NEGATIVE NEGATIVE Final    Comment:        The GeneXpert MRSA Assay (FDA approved for NASAL specimens only), is one component of a comprehensive MRSA colonization surveillance program. It is not intended to diagnose MRSA infection nor to guide or monitor treatment for MRSA infections. Performed at [facility], [place] [address], [place], [place] [address]     Radiology Reports DG Chest 2 View  Result Date:  02/03/2020 CLINICAL DATA:  Hypotension, shortness of breath EXAM: CHEST - 2 VIEW COMPARISON:  12/20/2019 FINDINGS: Patchy airspace disease in the lower lobes concerning for pneumonia. Heart is normal size. No effusions or pneumothorax. No acute bony abnormality. IMPRESSION: Patchy bilateral lower lobe airspace opacities concerning for pneumonia. Electronically Signed   By: Rolm Baptise M.D.   On: 02/03/2020 17:42   CT Angio Chest PE W and/or Wo Contrast  Result Date: 02/15/2020 CLINICAL DATA:  Worsening shortness of breath. EXAM: CT ANGIOGRAPHY CHEST WITH CONTRAST TECHNIQUE: Multidetector CT imaging of the chest was performed using the standard protocol during bolus administration of intravenous contrast. Multiplanar CT image reconstructions and MIPs were obtained to evaluate the vascular anatomy. CONTRAST:  38mL OMNIPAQUE IOHEXOL 350 MG/ML SOLN COMPARISON:  CT dated 02/07/2020 FINDINGS: Cardiovascular: Contrast injection is sufficient to demonstrate satisfactory opacification of the pulmonary arteries to the segmental level. There is no pulmonary embolus or evidence of right heart strain. The size of the main pulmonary artery is normal. Normal heart size with coronary artery calcification. The course and caliber of the aorta are normal. There is mild atherosclerotic calcification. Opacification decreased due to pulmonary arterial phase contrast bolus timing. Mediastinum/Nodes: -- No mediastinal lymphadenopathy. -- No hilar lymphadenopathy. -- No axillary lymphadenopathy. -- No supraclavicular lymphadenopathy. -- Normal thyroid gland where visualized. -  Unremarkable esophagus. Lungs/Pleura: Again noted is a cavitary mass in the right lower lobe similar to prior study. There are persistent ground-glass airspace opacities in the right upper lobe which are essentially stable from prior study. There are worsening peripheral ground-glass airspace opacities in the left lower and left upper lobe. There is no  pneumothorax. No large pleural effusion. Upper Abdomen: Contrast bolus timing is not optimized for evaluation of the abdominal organs. The visualized portions of the organs of the upper abdomen are normal. Musculoskeletal: There are subacute right-sided rib fractures involving the seventh through ninth ribs. Review of the MIP images confirms the above findings. IMPRESSION: 1. No evidence for acute pulmonary embolus. 2. Worsening peripheral ground-glass airspace opacities in the left lower and left upper lobe, concerning  for pneumonia (viral or bacterial) persistent cavitary mass in the right lower lobe. 3. Subacute right-sided rib fractures involving the seventh through ninth ribs. Aortic Atherosclerosis (ICD10-I70.0). Electronically Signed   By: Constance Holster M.D.   On: 02/15/2020 17:36   CT Angio Chest PE W and/or Wo Contrast  Result Date: 02/07/2020 CLINICAL DATA:  Positive D-dimer, shortness of breath, tachycardia. EXAM: CT ANGIOGRAPHY CHEST WITH CONTRAST TECHNIQUE: Multidetector CT imaging of the chest was performed using the standard protocol during bolus administration of intravenous contrast. Multiplanar CT image reconstructions and MIPs were obtained to evaluate the vascular anatomy. CONTRAST:  128mL OMNIPAQUE IOHEXOL 350 MG/ML SOLN COMPARISON:  12/20/2019 FINDINGS: Cardiovascular: No filling defects in the pulmonary arteries to suggest pulmonary emboli. Heart is normal size. Aortic and coronary artery calcifications. No aneurysm. Mediastinum/Nodes: Stable borderline sized right hilar lymph node. Scattered small and borderline sized mediastinal lymph nodes, stable. Trachea and esophagus are unremarkable. Thyroid unremarkable. Lungs/Pleura: Trace right pleural effusion. Masslike consolidation in the right lower lobe is again noted and is stable since prior study. New rounded masslike area of consolidation in the right upper lobe measuring 2.5 cm. Increasing airspace disease posteriorly and  peripherally in the right upper lobe and superior segment of the right lower lobe. Increasing ground-glass airspace opacity posteriorly in the superior segment of the left lower lobe. Paraseptal emphysema. Peripheral reticulation in the lungs bilaterally compatible with fibrosis. None none Upper Abdomen: Imaging into the upper abdomen demonstrates no acute findings. Musculoskeletal: Chest wall soft tissues are unremarkable. No acute bony abnormality. Review of the MIP images confirms the above findings. IMPRESSION: Rounded area of masslike consolidation in the right lower lobe with cavitation again noted, unchanged. New airspace disease seen in the right upper lobe and right lower lobe as well as superior segment of the left lower lobe. These areas are concerning for pneumonia. Areas of peripheral interstitial prominence and fibrosis bilaterally. Right hilar and borderline mediastinal lymph nodes are stable. Coronary artery disease. Aortic Atherosclerosis (ICD10-I70.0). Electronically Signed   By: Rolm Baptise M.D.   On: 02/07/2020 20:24   DG Chest Port 1 View  Result Date: 02/15/2020 CLINICAL DATA:  Cough, worsening dyspnea. EXAM: PORTABLE CHEST 1 VIEW COMPARISON:  02/07/2020 FINDINGS: Improved aeration at the right lung base compared to the prior examination. Interstitial thickening and chronic changes at the left lung base are unchanged. Heart and mediastinum are grossly stable. IMPRESSION: 1. Improved aeration at the right lung base compared to the recent comparison examination. 2. Chronic changes. Electronically Signed   By: Markus Daft M.D.   On: 02/15/2020 13:16   DG Chest Portable 1 View  Result Date: 02/07/2020 CLINICAL DATA:  Shortness of breath, cough, congestion EXAM: PORTABLE CHEST 1 VIEW COMPARISON:  02/03/2020 FINDINGS: Airspace disease in the right mid and lower lung again noted, most confluent at the right lung base compatible with pneumonia. Heart is normal size. Left lung clear.  Previously seen patchy opacities at the left base have resolved. No effusions or acute bony abnormality. IMPRESSION: Continued patchy right mid and lower lung airspace opacities concerning for pneumonia. Clearing of the left basilar opacities since prior study. Electronically Signed   By: Rolm Baptise M.D.   On: 02/07/2020 18:51   VAS [place] LOWER EXTREMITY VENOUS (DVT)  Result Date: 02/10/2020  Lower Venous DVT Study Indications: Elevated Ddimer.  Risk Factors: COVID 19 positive. Comparison Study: No prior studies. Performing Technologist: Oliver Hum RVT  Examination Guidelines: A complete evaluation includes B-mode imaging, spectral Doppler,  color Doppler, and power Doppler as needed of all accessible portions of each vessel. Bilateral testing is considered an integral part of a complete examination. Limited examinations for reoccurring indications may be performed as noted. The reflux portion of the exam is performed with the patient in reverse Trendelenburg.  +---------+---------------+---------+-----------+----------+--------------+ RIGHT    CompressibilityPhasicitySpontaneityPropertiesThrombus Aging +---------+---------------+---------+-----------+----------+--------------+ CFV      Full           Yes      Yes                                 +---------+---------------+---------+-----------+----------+--------------+ SFJ      Full                                                        +---------+---------------+---------+-----------+----------+--------------+ FV Prox  Full                                                        +---------+---------------+---------+-----------+----------+--------------+ FV Mid   Full                                                        +---------+---------------+---------+-----------+----------+--------------+ FV DistalFull                                                         +---------+---------------+---------+-----------+----------+--------------+ PFV      Full                                                        +---------+---------------+---------+-----------+----------+--------------+ POP      Full           Yes      Yes                                 +---------+---------------+---------+-----------+----------+--------------+ PTV      Full                                                        +---------+---------------+---------+-----------+----------+--------------+ PERO     Full                                                        +---------+---------------+---------+-----------+----------+--------------+   +---------+---------------+---------+-----------+----------+--------------+  LEFT     CompressibilityPhasicitySpontaneityPropertiesThrombus Aging +---------+---------------+---------+-----------+----------+--------------+ CFV      Full           Yes      Yes                                 +---------+---------------+---------+-----------+----------+--------------+ SFJ      Full                                                        +---------+---------------+---------+-----------+----------+--------------+ FV Prox  Full                                                        +---------+---------------+---------+-----------+----------+--------------+ FV Mid   Full                                                        +---------+---------------+---------+-----------+----------+--------------+ FV DistalFull                                                        +---------+---------------+---------+-----------+----------+--------------+ PFV      Full                                                        +---------+---------------+---------+-----------+----------+--------------+ POP      Full           Yes      Yes                                  +---------+---------------+---------+-----------+----------+--------------+ PTV      Full                                                        +---------+---------------+---------+-----------+----------+--------------+ PERO     Full                                                        +---------+---------------+---------+-----------+----------+--------------+     Summary: RIGHT: - There is no evidence of deep vein thrombosis in the lower extremity.  - No cystic structure found in the popliteal fossa.  LEFT: - There is no evidence of deep vein thrombosis in the lower extremity.  - No  cystic structure found in the popliteal fossa.  *See table(s) above for measurements and observations. Electronically signed by Ruta Hinds MD on 02/10/2020 at 6:25:28 PM.    Final    US Abdomen Limited RUQ (LIVER/GB)  Result Date: 02/15/2020 CLINICAL DATA:  Right upper quadrant abdominal pain. History of prior cholecystectomy. EXAM: ULTRASOUND ABDOMEN LIMITED RIGHT UPPER QUADRANT COMPARISON:  None. FINDINGS: Gallbladder: The gallbladder is surgically absent. Common bile duct: Diameter: 4.9 mm Liver: No focal lesion identified. Within normal limits in parenchymal echogenicity. Portal vein is patent on color Doppler imaging with normal direction of blood flow towards the liver. Other: The study is limited secondary to overlying bowel gas. IMPRESSION: 1. Findings consistent with prior cholecystectomy. 2. Otherwise, unremarkable right upper quadrant ultrasound. Electronically Signed   By: Virgina Norfolk M.D.   On: 02/15/2020 21:02

## 2020-02-19 NOTE — Progress Notes (Signed)
Manufacturing engineer [facility]) Community Based Palliative Care       This patient has been referred to our palliative care services in the community.  [facility] will continue to follow for any discharge planning needs and to coordinate admission onto palliative care.   If you have questions or need assistance, please call [phone] or contact the hospital Liaison listed on [facility]ION.     Thank you for the opportunity to participate in this patient's care.     Domenic Moras, BSN, RN [facility] Liaison   [phone]

## 2020-02-19 NOTE — Progress Notes (Signed)
   02/19/20 1233  Assess: MEWS Score  Temp 97.9 F (36.6 C)  BP 110/90  Pulse Rate (!) 113  ECG Heart Rate (!) 116  Resp (!) 24  SpO2 100 %  O2 Device Non-rebreather Mask  Assess: MEWS Score  MEWS Temp 0  MEWS Systolic 0  MEWS Pulse 2  MEWS RR 1  MEWS LOC 0  MEWS Score 3  MEWS Score Color Yellow  Assess: if the MEWS score is Yellow or Red  Were vital signs taken at a resting state? Yes  Focused Assessment No change from prior assessment  Early Detection of Sepsis Score *See Row Information* Low  MEWS guidelines implemented *See Row Information* Yes  Treat  MEWS Interventions Escalated (See documentation below)  Pain Scale 0-10  Pain Score 2  Take Vital Signs  Increase Vital Sign Frequency  Yellow: Q 2hr X 2 then Q 4hr X 2, if remains yellow, continue Q 4hrs  Escalate  MEWS: Escalate Yellow: discuss with charge nurse/RN and consider discussing with provider and RRT  Notify: Charge Nurse/RN  Name of Charge Nurse/RN Notified Elisa RN  Date Charge Nurse/RN Notified 02/19/20  Time Charge Nurse/RN Notified 26  Document  Patient Outcome Stabilized after interventions  Progress note created (see row info) Yes

## 2020-02-20 LAB — CBC WITH DIFFERENTIAL/PLATELET
Abs Immature Granulocytes: 0.07 10*3/uL (ref 0.00–0.07)
Basophils Absolute: 0 10*3/uL (ref 0.0–0.1)
Basophils Relative: 0 %
Eosinophils Absolute: 0 10*3/uL (ref 0.0–0.5)
Eosinophils Relative: 0 %
HCT: 37.3 % — ABNORMAL LOW (ref 39.0–52.0)
Hemoglobin: 12.7 g/dL — ABNORMAL LOW (ref 13.0–17.0)
Immature Granulocytes: 1 %
Lymphocytes Relative: 2 %
Lymphs Abs: 0.2 10*3/uL — ABNORMAL LOW (ref 0.7–4.0)
MCH: 33.3 pg (ref 26.0–34.0)
MCHC: 34 g/dL (ref 30.0–36.0)
MCV: 97.9 fL (ref 80.0–100.0)
Monocytes Absolute: 0.4 10*3/uL (ref 0.1–1.0)
Monocytes Relative: 3 %
Neutro Abs: 10.9 10*3/uL — ABNORMAL HIGH (ref 1.7–7.7)
Neutrophils Relative %: 94 %
Platelets: 197 10*3/uL (ref 150–400)
RBC: 3.81 MIL/uL — ABNORMAL LOW (ref 4.22–5.81)
RDW: 13.6 % (ref 11.5–15.5)
WBC: 11.5 10*3/uL — ABNORMAL HIGH (ref 4.0–10.5)
nRBC: 0 % (ref 0.0–0.2)

## 2020-02-20 LAB — FERRITIN: Ferritin: 1447 ng/mL — ABNORMAL HIGH (ref 24–336)

## 2020-02-20 LAB — COMPREHENSIVE METABOLIC PANEL WITH GFR
ALT: 18 U/L (ref 0–44)
AST: 27 U/L (ref 15–41)
Albumin: 2.5 g/dL — ABNORMAL LOW (ref 3.5–5.0)
Alkaline Phosphatase: 62 U/L (ref 38–126)
Anion gap: 10 (ref 5–15)
BUN: 33 mg/dL — ABNORMAL HIGH (ref 8–23)
CO2: 25 mmol/L (ref 22–32)
Calcium: 8.6 mg/dL — ABNORMAL LOW (ref 8.9–10.3)
Chloride: 98 mmol/L (ref 98–111)
Creatinine, Ser: 1.12 mg/dL (ref 0.61–1.24)
GFR, Estimated: >60 mL/min
Glucose, Bld: 156 mg/dL — ABNORMAL HIGH (ref 70–99)
Potassium: 4.2 mmol/L (ref 3.5–5.1)
Sodium: 133 mmol/L — ABNORMAL LOW (ref 135–145)
Total Bilirubin: 0.6 mg/dL (ref 0.3–1.2)
Total Protein: 6.5 g/dL (ref 6.5–8.1)

## 2020-02-20 LAB — CULTURE, BLOOD (ROUTINE X 2)
Culture: NO GROWTH
Culture: NO GROWTH

## 2020-02-20 LAB — GLUCOSE, CAPILLARY
Glucose-Capillary: 129 mg/dL — ABNORMAL HIGH (ref 70–99)
Glucose-Capillary: 185 mg/dL — ABNORMAL HIGH (ref 70–99)
Glucose-Capillary: 250 mg/dL — ABNORMAL HIGH (ref 70–99)

## 2020-02-20 LAB — MAGNESIUM: Magnesium: 2 mg/dL (ref 1.7–2.4)

## 2020-02-20 LAB — D-DIMER, QUANTITATIVE: D-Dimer, Quant: 1.51 ug{FEU}/mL — ABNORMAL HIGH (ref 0.00–0.50)

## 2020-02-20 LAB — C-REACTIVE PROTEIN: CRP: 1.1 mg/dL — ABNORMAL HIGH

## 2020-02-20 MED ORDER — METHYLPREDNISOLONE SODIUM SUCC 125 MG IJ SOLR
50.0000 mg | Freq: Two times a day (BID) | INTRAMUSCULAR | Status: DC
  Administered 2020-02-20 – 2020-02-21 (×3): 50 mg via INTRAVENOUS
  Filled 2020-02-20 (×3): qty 2

## 2020-02-20 NOTE — Progress Notes (Signed)
PROGRESS NOTE                                                                                                                                                                                                             Patient Demographics:    Melvin Foley, is a 74 y.o. male, DOB - [birth year], GEX:[id number]  Outpatient Primary MD for the patient is Wallie Char, FNP   Admit date - 02/15/2020   LOS - 5  Chief Complaint  Patient presents with  . Covid Positive  . Pneumonia       Brief Narrative: Patient is a 74 y.o. male with PMHx of stage IIIb adenocarcinoma-finished radiation approximately 6 weeks back (follows at [facility])-recently hospitalized from 10/22-10/25 COVID-19 pneumonia-did not require O2 on discharge-presented back to the ED on 10/30 with shortness of breath-found to have severe hypoxic respiratory failure due to ongoing COVID-19 pneumonitis.  COVID-19 vaccinated status: Unvaccinated   Significant Events: 10/18>> evaluated at [facility] for hypotension/possible pneumonia-refused admission-Covid 19 negative 10/22-10/25>> hospitalization for Covid-discharged on room air. 10/30>> readmitted with severe hypoxemia-due to ongoing COVID-19 pneumonitis.  Significant studies: 10/22>>Chest x-ray: Patchy right mid/lower lung opacities concerning for pneumonia 10/22>> CTA chest: New airspace disease in the right upper/right lower lobe/left lower lobe-rounded area of masslike consolidation in the right lower lobe with cavitation again noted. 10/31>> CTA chest: No PE, worsening peripheral groundglass opacities, subacute right sided rib fractures along the seventh through ninth ribs.  COVID-19 medications: Steroids: 10/22>> Remdesivir: 10/22>>10/26 Baricitinib: 10/31>>  Antibiotics: Vancomycin: 10/30>> 10/31 Cefepime: 10/30>> 10/31  Microbiology data: 10/22 >>blood culture: No growth 10/30>>  blood culture: No growth  Procedures: None  Consults: None  DVT prophylaxis: enoxaparin (LOVENOX) injection 40 mg Start: 02/15/20 2300    Subjective:   Epistaxis has resolved-some crusted blood on the left anterior nare.  Feels better-due to epistaxis-she was placed on NRB for the past 24 hours-Per nursing staff-now requiring 3 L of HFNC.   Assessment  & Plan :   Acute hypoxic respiratory failure secondary to Covid 19 Viral pneumonia: Seems to be slowly improving-at one point he was on 12 L of HFNC-now down to 3 L at rest, but per nursing staff-required up to 7 L with mobilization.  Continue steroids/baricitinib.  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  Although he has no signs of volume overload-we will cautiously continue oral Lasix for another day or so-to ensure negative balance.  Renal function remains stable.  Fever: afebrile O2 requirements:  SpO2: 97 % O2 Flow Rate (L/min): 3 L/min   COVID-19 Labs: Recent Labs    02/18/20 0141 02/19/20 0054 02/20/20 0304  DDIMER 1.74* 1.54* 1.51*  FERRITIN 1,425* 1,346* 1,447*  CRP 2.5* 1.1* 1.1*       Component Value Date/Time   BNP 118.2 (H) 02/15/2020 1243    Recent Labs  Lab 02/15/20 1426  PROCALCITON 0.11    Lab Results  Component Value Date   SARSCOV2NAA POSITIVE (A) 02/07/2020   SARSCOV2NAA NEGATIVE 02/03/2020   [place] NEGATIVE 12/20/2019   [place] NEGATIVE 12/14/2019     Prone/Incentive Spirometry: encouraged  incentive spirometry use 3-4/hour.  Epistaxis-mild-resolved-treated with Afrin nasal spray.  Continue monitoring.  COPD: No evidence of flare-continue bronchodilators  Chronic diastolic heart failure: Euvolemic-on Lasix-follow closely.  History of prior CVA/carotid  endarterectomy: Continue Plavix.  Stage IIIb adenocarcinoma of the lung: Claims recently completed RTX at [facility] 6 weeks back-before that he was on chemo at the [facility] hospital.  CT chest continues to show right lower lobe mass.  Follow with oncology post discharge. However per spouse on 11/2-patient has made up his mind not to pursue any further treatment for this cancer.  Incidental finding of right rib fractures: Supportive care.  Does not have significant pain-apparently did fall off his bed (soft fall per wife) approximately a week back-per spouse-she was told that he is going to have weak ribs due to radiation.  Schizophrenia: Appears stable-continue thiothixene   Palliative care/goals of care discussion: Understands that he is severely weak with extensive parenchymal lung injury due to COVID-19 pneumonia-he reconfirms that he does not want to be intubated in case he worsens-but does want to pursue CPR for arrhythmias etc. He understands that he remains at significant risk for further worsening in spite of aggressive medical care-given his underlying comorbidities.  Spouse aware of tenuous clinical situation as well.   ABG:    Component Value Date/Time   PHART 7.477 (H) 02/15/2020 1314   PCO2ART 26.3 (L) 02/15/2020 1314   PO2ART 114 (H) 02/15/2020 1314   HCO3 19.4 (L) 02/15/2020 1314   TCO2 20 (L) 02/15/2020 1314   ACIDBASEDEF 2.0 02/15/2020 1314   O2SAT 99.0 02/15/2020 1314    Vent Settings: N/A    Condition -Guarded  Family Communication  : Spouse-Ruth-[phone]-on 11/4-asking for video conferencing as she cannot come and visit-have let the charge nurse know.  Code Status :  DNI  Diet :  Diet Order            DIET DYS 3 Room service appropriate? Yes; Fluid consistency: Thin  Diet effective now                  Disposition Plan  :   Status is: Inpatient  Remains inpatient appropriate because:Inpatient level of care appropriate due to severity of  illness  Dispo: The patient is from: Home              Anticipated d/c is to: Home              Anticipated d/c date is: > 3 days  Patient currently is not medically stable to d/c.   Barriers to discharge: Hypoxia requiring O2 supplementation  Antimicorbials  :    Anti-infectives (From admission, onward)   Start     Dose/Rate Route Frequency Ordered Stop   02/16/20 1000  ceFEPIme (MAXIPIME) 2 g in sodium chloride 0.9 % 100 mL IVPB  Status:  Discontinued        2 g 200 mL/hr over 30 Minutes Intravenous Every 12 hours 02/15/20 1951 02/16/20 1105   02/16/20 0800  vancomycin (VANCOREADY) IVPB 500 mg/100 mL  Status:  Discontinued        500 mg 100 mL/hr over 60 Minutes Intravenous Every 12 hours 02/15/20 1844 02/16/20 1105   02/15/20 1845  ceFEPIme (MAXIPIME) 2 g in sodium chloride 0.9 % 100 mL IVPB        2 g 200 mL/hr over 30 Minutes Intravenous  Once 02/15/20 1839 02/15/20 1949   02/15/20 1845  vancomycin (VANCOREADY) IVPB 1500 mg/300 mL        1,500 mg 150 mL/hr over 120 Minutes Intravenous  Once 02/15/20 1844 02/15/20 2152      Inpatient Medications  Scheduled Meds: . vitamin C  500 mg Oral Daily  . baricitinib  4 mg Oral Daily  . benzonatate  200 mg Oral TID  . clopidogrel  75 mg Oral QHS  . enoxaparin (LOVENOX) injection  40 mg Subcutaneous Q24H  . feeding supplement  237 mL Oral TID BM  . fluticasone  2 spray Each Nare Daily  . fluticasone furoate-vilanterol  1 puff Inhalation Daily  . furosemide  40 mg Oral Daily  . gemfibrozil  600 mg Oral BID AC  . guaiFENesin  600 mg Oral BID  . mouth rinse  15 mL Mouth Rinse BID  . methylPREDNISolone (SOLU-MEDROL) injection  60 mg Intravenous Q12H  . oxymetazoline  3 spray Each Nare BID  . pantoprazole  40 mg Oral Daily  . rosuvastatin  40 mg Oral Daily  . sodium chloride flush  3 mL Intravenous Q12H  . thiothixene  15 mg Oral QHS  . zinc sulfate  220 mg Oral Daily   Continuous Infusions: . lactated ringers  Stopped (02/16/20 1900)   PRN Meds:.acetaminophen, albuterol, alum & mag hydroxide-simeth, chlorpheniramine-HYDROcodone, HYDROcodone-acetaminophen, morphine injection, ondansetron **OR** ondansetron (ZOFRAN) IV, sodium chloride   Time Spent in minutes  25  See all Orders from today for further details   Oren Binet M.D on 02/20/2020 at 1:40 PM  To page go to [email or web address] - use universal password  [facility] -  Office  [phone]    Objective:   Vitals:   02/20/20 0829 02/20/20 0855 02/20/20 1139 02/20/20 1250  BP:   101/76   Pulse: 78  100   Resp:   20   Temp:   98.1 F (36.7 C)   TempSrc:   Oral   SpO2: 100% 90% 92% 97%  Weight:      Height:        Wt Readings from Last 3 Encounters:  02/16/20 67 kg  02/08/20 67.1 kg  02/03/20 69.7 kg     Intake/Output Summary (Last 24 hours) at 02/20/2020 1340 Last data filed at 02/20/2020 1159 Gross per 24 hour  Intake 120 ml  Output 700 ml  Net -580 ml     Physical Exam Gen Exam:Alert awake-not in any distress HEENT:atraumatic, normocephalic Chest: B/L clear to auscultation anteriorly CVS:S1S2 regular Abdomen:soft non tender, non distended Extremities:no edema Neurology: Non focal Skin: no  rash   Data Review:    CBC Recent Labs  Lab 02/15/20 2321 02/17/20 0424 02/18/20 0141 02/19/20 0054 02/20/20 0304  WBC 5.6 5.2 11.4* 8.9 11.5*  HGB 12.9* 11.7* 11.7* 11.9* 12.7*  HCT 39.7 35.5* 34.7* 35.0* 37.3*  PLT 188 188 186 195 197  MCV 101.5* 99.4 97.5 98.9 97.9  MCH 33.0 32.8 32.9 33.6 33.3  MCHC 32.5 33.0 33.7 34.0 34.0  RDW 14.4 13.8 13.6 13.8 13.6  LYMPHSABS 0.3* 0.3* 0.2* 0.2* 0.2*  MONOABS 0.7 0.2 0.4 0.3 0.4  EOSABS 0.0 0.0 0.0 0.0 0.0  BASOSABS 0.0 0.0 0.0 0.0 0.0    Chemistries  Recent Labs  Lab 02/15/20 2321 02/17/20 0424 02/18/20 0141 02/19/20 0054 02/20/20 0304  NA 132* 136 135 134* 133*  K 3.8 4.2 4.3 4.3 4.2  CL 104 106 103 102 98  CO2 21* 21* 23 23 25   GLUCOSE 95 169*  176* 139* 156*  BUN 27* 22 26* 28* 33*  CREATININE 0.98 0.91 0.90 0.82 1.12  CALCIUM 8.0* 8.3* 8.7* 8.6* 8.6*  MG 1.9 2.1 1.9 2.1 2.0  AST 24 23 29 25 27   ALT 16 14 16 17 18   ALKPHOS 60 54 56 57 62  BILITOT 1.0 0.8 0.6 0.9 0.6   ------------------------------------------------------------------------------------------------------------------ No results for input(s): CHOL, HDL, LDLCALC, TRIG, CHOLHDL, LDLDIRECT in the last 72 hours.  Lab Results  Component Value Date   HGBA1C 5.1 05/01/2017   ------------------------------------------------------------------------------------------------------------------ No results for input(s): TSH, T4TOTAL, T3FREE, THYROIDAB in the last 72 hours.  Invalid input(s): FREET3 ------------------------------------------------------------------------------------------------------------------ Recent Labs    02/19/20 0054 02/20/20 0304  FERRITIN 1,346* 1,447*    Coagulation profile No results for input(s): INR, PROTIME in the last 168 hours.  Recent Labs    02/19/20 0054 02/20/20 0304  DDIMER 1.54* 1.51*    Cardiac Enzymes No results for input(s): CKMB, TROPONINI, MYOGLOBIN in the last 168 hours.  Invalid input(s): CK ------------------------------------------------------------------------------------------------------------------    Component Value Date/Time   BNP 118.2 (H) 02/15/2020 1243    Micro Results Recent Results (from the past 240 hour(s))  Blood Culture (routine x 2)     Status: None   Collection Time: 02/15/20 12:45 PM   Specimen: BLOOD LEFT FOREARM  Result Value Ref Range Status   Specimen Description BLOOD LEFT FOREARM  Final   Special Requests   Final    BOTTLES DRAWN AEROBIC AND ANAEROBIC Blood Culture results may not be optimal due to an inadequate volume of blood received in culture bottles   Culture   Final    NO GROWTH 5 DAYS Performed at [facility], [place] [address], [place], [place] [address]     Report Status 02/20/2020 FINAL  Final  Blood Culture (routine x 2)     Status: None   Collection Time: 02/15/20  1:16 PM   Specimen: BLOOD  Result Value Ref Range Status   Specimen Description BLOOD RIGHT ANTECUBITAL  Final   Special Requests   Final    BOTTLES DRAWN AEROBIC AND ANAEROBIC Blood Culture results may not be optimal due to an inadequate volume of blood received in culture bottles   Culture   Final    NO GROWTH 5 DAYS Performed at [facility], [place] [address], [place], [place] [address]    Report Status 02/20/2020 FINAL  Final  MRSA PCR Screening     Status: None   Collection Time: 02/15/20  8:12 PM   Specimen: Nasal Mucosa; Nasopharyngeal  Result Value Ref Range Status   MRSA by PCR NEGATIVE NEGATIVE Final    Comment:        The GeneXpert MRSA Assay (FDA approved for NASAL specimens only), is one component of a comprehensive MRSA colonization surveillance program. It is not intended to diagnose MRSA infection nor to guide or monitor treatment for MRSA infections. Performed at [facility], [place] [address], [place], [place] [address]     Radiology Reports DG Chest 2 View  Result Date: 02/03/2020 CLINICAL DATA:  Hypotension, shortness of breath EXAM: CHEST - 2 VIEW COMPARISON:  12/20/2019 FINDINGS: Patchy airspace disease in the lower lobes concerning for pneumonia. Heart is normal size. No effusions or pneumothorax. No acute bony abnormality. IMPRESSION: Patchy bilateral lower lobe airspace opacities concerning for pneumonia. Electronically Signed   By: Rolm Baptise M.D.   On: 02/03/2020 17:42   CT Angio Chest PE W and/or Wo Contrast  Result Date: 02/15/2020 CLINICAL DATA:  Worsening shortness of breath. EXAM: CT ANGIOGRAPHY CHEST WITH CONTRAST TECHNIQUE: Multidetector CT imaging of the chest was performed using the standard protocol during bolus administration of intravenous contrast. Multiplanar CT image reconstructions and MIPs were obtained to  evaluate the vascular anatomy. CONTRAST:  34mL OMNIPAQUE IOHEXOL 350 MG/ML SOLN COMPARISON:  CT dated 02/07/2020 FINDINGS: Cardiovascular: Contrast injection is sufficient to demonstrate satisfactory opacification of the pulmonary arteries to the segmental level. There is no pulmonary embolus or evidence of right heart strain. The size of the main pulmonary artery is normal. Normal heart size with coronary artery calcification. The course and caliber of the aorta are normal. There is mild atherosclerotic calcification. Opacification decreased due to pulmonary arterial phase contrast bolus timing. Mediastinum/Nodes: -- No mediastinal lymphadenopathy. -- No hilar lymphadenopathy. -- No axillary lymphadenopathy. -- No supraclavicular lymphadenopathy. -- Normal thyroid gland where visualized. -  Unremarkable esophagus. Lungs/Pleura: Again noted is a cavitary mass in the right lower lobe similar to prior study. There are persistent ground-glass airspace opacities in the right upper lobe which are essentially stable from prior study. There are worsening peripheral ground-glass airspace opacities in the left lower and left upper lobe. There is no pneumothorax. No large pleural effusion. Upper Abdomen: Contrast bolus timing is not optimized for evaluation of the abdominal organs. The visualized portions of the organs of the upper abdomen are normal. Musculoskeletal: There are subacute right-sided rib fractures involving the seventh through ninth ribs. Review of the MIP images confirms the above findings. IMPRESSION: 1. No evidence for acute pulmonary embolus. 2. Worsening peripheral ground-glass airspace opacities in the left lower and left upper lobe, concerning for pneumonia (viral or bacterial) persistent cavitary mass in the right lower lobe. 3. Subacute right-sided rib fractures involving the seventh through ninth ribs. Aortic Atherosclerosis (ICD10-I70.0). Electronically Signed   By: Constance Holster M.D.   On:  02/15/2020 17:36   CT Angio Chest PE W and/or Wo Contrast  Result Date: 02/07/2020 CLINICAL DATA:  Positive D-dimer, shortness of breath, tachycardia. EXAM: CT ANGIOGRAPHY CHEST WITH CONTRAST TECHNIQUE: Multidetector CT imaging of the chest was performed using the standard protocol during bolus administration of intravenous contrast. Multiplanar CT image reconstructions and MIPs were obtained to evaluate the vascular anatomy. CONTRAST:  1106mL OMNIPAQUE IOHEXOL 350 MG/ML SOLN COMPARISON:  12/20/2019 FINDINGS: Cardiovascular: No filling defects in the pulmonary arteries to suggest pulmonary emboli. Heart is normal size. Aortic and coronary artery calcifications. No aneurysm. Mediastinum/Nodes: Stable borderline sized right hilar lymph node. Scattered small and borderline sized mediastinal lymph  nodes, stable. Trachea and esophagus are unremarkable. Thyroid unremarkable. Lungs/Pleura: Trace right pleural effusion. Masslike consolidation in the right lower lobe is again noted and is stable since prior study. New rounded masslike area of consolidation in the right upper lobe measuring 2.5 cm. Increasing airspace disease posteriorly and peripherally in the right upper lobe and superior segment of the right lower lobe. Increasing ground-glass airspace opacity posteriorly in the superior segment of the left lower lobe. Paraseptal emphysema. Peripheral reticulation in the lungs bilaterally compatible with fibrosis. None none Upper Abdomen: Imaging into the upper abdomen demonstrates no acute findings. Musculoskeletal: Chest wall soft tissues are unremarkable. No acute bony abnormality. Review of the MIP images confirms the above findings. IMPRESSION: Rounded area of masslike consolidation in the right lower lobe with cavitation again noted, unchanged. New airspace disease seen in the right upper lobe and right lower lobe as well as superior segment of the left lower lobe. These areas are concerning for pneumonia. Areas  of peripheral interstitial prominence and fibrosis bilaterally. Right hilar and borderline mediastinal lymph nodes are stable. Coronary artery disease. Aortic Atherosclerosis (ICD10-I70.0). Electronically Signed   By: Rolm Baptise M.D.   On: 02/07/2020 20:24   DG Chest Port 1 View  Result Date: 02/15/2020 CLINICAL DATA:  Cough, worsening dyspnea. EXAM: PORTABLE CHEST 1 VIEW COMPARISON:  02/07/2020 FINDINGS: Improved aeration at the right lung base compared to the prior examination. Interstitial thickening and chronic changes at the left lung base are unchanged. Heart and mediastinum are grossly stable. IMPRESSION: 1. Improved aeration at the right lung base compared to the recent comparison examination. 2. Chronic changes. Electronically Signed   By: Markus Daft M.D.   On: 02/15/2020 13:16   DG Chest Portable 1 View  Result Date: 02/07/2020 CLINICAL DATA:  Shortness of breath, cough, congestion EXAM: PORTABLE CHEST 1 VIEW COMPARISON:  02/03/2020 FINDINGS: Airspace disease in the right mid and lower lung again noted, most confluent at the right lung base compatible with pneumonia. Heart is normal size. Left lung clear. Previously seen patchy opacities at the left base have resolved. No effusions or acute bony abnormality. IMPRESSION: Continued patchy right mid and lower lung airspace opacities concerning for pneumonia. Clearing of the left basilar opacities since prior study. Electronically Signed   By: Rolm Baptise M.D.   On: 02/07/2020 18:51   VAS [place] LOWER EXTREMITY VENOUS (DVT)  Result Date: 02/10/2020  Lower Venous DVT Study Indications: Elevated Ddimer.  Risk Factors: COVID 19 positive. Comparison Study: No prior studies. Performing Technologist: Oliver Hum RVT  Examination Guidelines: A complete evaluation includes B-mode imaging, spectral Doppler, color Doppler, and power Doppler as needed of all accessible portions of each vessel. Bilateral testing is considered an integral part of a  complete examination. Limited examinations for reoccurring indications may be performed as noted. The reflux portion of the exam is performed with the patient in reverse Trendelenburg.  +---------+---------------+---------+-----------+----------+--------------+ RIGHT    CompressibilityPhasicitySpontaneityPropertiesThrombus Aging +---------+---------------+---------+-----------+----------+--------------+ CFV      Full           Yes      Yes                                 +---------+---------------+---------+-----------+----------+--------------+ SFJ      Full                                                        +---------+---------------+---------+-----------+----------+--------------+  FV Prox  Full                                                        +---------+---------------+---------+-----------+----------+--------------+ FV Mid   Full                                                        +---------+---------------+---------+-----------+----------+--------------+ FV DistalFull                                                        +---------+---------------+---------+-----------+----------+--------------+ PFV      Full                                                        +---------+---------------+---------+-----------+----------+--------------+ POP      Full           Yes      Yes                                 +---------+---------------+---------+-----------+----------+--------------+ PTV      Full                                                        +---------+---------------+---------+-----------+----------+--------------+ PERO     Full                                                        +---------+---------------+---------+-----------+----------+--------------+   +---------+---------------+---------+-----------+----------+--------------+ LEFT     CompressibilityPhasicitySpontaneityPropertiesThrombus Aging  +---------+---------------+---------+-----------+----------+--------------+ CFV      Full           Yes      Yes                                 +---------+---------------+---------+-----------+----------+--------------+ SFJ      Full                                                        +---------+---------------+---------+-----------+----------+--------------+ FV Prox  Full                                                        +---------+---------------+---------+-----------+----------+--------------+  FV Mid   Full                                                        +---------+---------------+---------+-----------+----------+--------------+ FV DistalFull                                                        +---------+---------------+---------+-----------+----------+--------------+ PFV      Full                                                        +---------+---------------+---------+-----------+----------+--------------+ POP      Full           Yes      Yes                                 +---------+---------------+---------+-----------+----------+--------------+ PTV      Full                                                        +---------+---------------+---------+-----------+----------+--------------+ PERO     Full                                                        +---------+---------------+---------+-----------+----------+--------------+     Summary: RIGHT: - There is no evidence of deep vein thrombosis in the lower extremity.  - No cystic structure found in the popliteal fossa.  LEFT: - There is no evidence of deep vein thrombosis in the lower extremity.  - No cystic structure found in the popliteal fossa.  *See table(s) above for measurements and observations. Electronically signed by Ruta Hinds MD on 02/10/2020 at 6:25:28 PM.    Final    US Abdomen Limited RUQ (LIVER/GB)  Result Date: 02/15/2020 CLINICAL DATA:  Right  upper quadrant abdominal pain. History of prior cholecystectomy. EXAM: ULTRASOUND ABDOMEN LIMITED RIGHT UPPER QUADRANT COMPARISON:  None. FINDINGS: Gallbladder: The gallbladder is surgically absent. Common bile duct: Diameter: 4.9 mm Liver: No focal lesion identified. Within normal limits in parenchymal echogenicity. Portal vein is patent on color Doppler imaging with normal direction of blood flow towards the liver. Other: The study is limited secondary to overlying bowel gas. IMPRESSION: 1. Findings consistent with prior cholecystectomy. 2. Otherwise, unremarkable right upper quadrant ultrasound. Electronically Signed   By: Virgina Norfolk M.D.   On: 02/15/2020 21:02

## 2020-02-20 NOTE — Progress Notes (Signed)
Occupational Therapy Treatment Patient Details Name: Melvin Foley MRN: [id number] DOB: Feb 17, 1946 Today's Date: 02/20/2020    History of present illness 74 y.o. male with medical history significant of lung cancer (finished radiation 6 weeks ago), COPD, chronic diastolic CHF, CVA, schizophrenia, presented to ED 10/30 with known COVID infection (+10/22 with hospitalization 10/22-10/25) and worsening dyspnea. CTA negative for PE   OT comments  Pt continues to present with decreased cognition, balance, and activity tolerance. Pt performing functional mobility in room with Min guard A and RW. Pt performing oral care at sink with Min guard A. Pt with poor activity tolerance as seen by shortness of breath, fatigue, and decreased SpO2 to 83% on 4L. Pt requiring seated rest break, purse lip breathing, and 6L O2 to recover back to 90%. Continue to recommend dc to home with HHOT and will continue to follow acutely as admitted.   Follow Up Recommendations  Home health OT;Supervision/Assistance - 24 hour    Equipment Recommendations  3 in 1 bedside commode    Recommendations for Other Services PT consult    Precautions / Restrictions Precautions Precautions: Fall       Mobility Bed Mobility Overal bed mobility: Needs Assistance Bed Mobility: Supine to Sit     Supine to sit: Min guard     General bed mobility comments: Min guard A for safety. No physical A needed  Transfers Overall transfer level: Needs assistance Equipment used: Rolling walker (2 wheeled);None Transfers: Sit to/from Stand Sit to Stand: Min guard         General transfer comment: Min Guard A for safety    Balance Overall balance assessment: Needs assistance Sitting-balance support: No upper extremity supported;Feet supported Sitting balance-Leahy Scale: Good     Standing balance support: No upper extremity supported Standing balance-Leahy Scale: Fair                             ADL  either performed or assessed with clinical judgement   ADL Overall ADL's : Needs assistance/impaired     Grooming: Oral care;Min guard;Standing Grooming Details (indicate cue type and reason): Pt performing oral care at sink and SpO2 dropping to 83% on 4L; elevated to 6L and cued pt to sit for rest break. Pt requiring increased time to recover back to 90%                 Toilet Transfer: Min guard;Ambulation;RW (simulated to recliner) Toilet Transfer Details (indicate cue type and reason): Min Guard A for safety         Functional mobility during ADLs: Min guard;Rolling walker General ADL Comments: Pt performing functional mobility to sink to complete oral care. Min Guard throughout. Poor activity tolerance and SpO2 falling to low 80s on 4L; requiring 6L      Vision       Perception     Praxis      Cognition Arousal/Alertness: Awake/alert Behavior During Therapy: Flat affect Overall Cognitive Status: Impaired/Different from baseline Area of Impairment: Attention;Following commands;Awareness;Problem solving;Safety/judgement                   Current Attention Level: Sustained   Following Commands: Follows one step commands inconsistently;Follows one step commands with increased time Safety/Judgement: Decreased awareness of safety Awareness: Intellectual Problem Solving: Slow processing General Comments: Pt presenting with decreased problem solving and awareness. Poor safety awareness. Requiring increased time throughout        Exercises  Shoulder Instructions       General Comments Upon arrival, pt panting and tangled in his lines. Pt having gotten from [facility] to bed by himself (despite just having called for assistance). During activity, SpO2 dropping to 83% (good pleth line) on 4L. Placed pt on 6L to elevate SpO2 >88%.     Pertinent Vitals/ Pain       Pain Assessment: No/denies pain  Home Living                                           Prior Functioning/Environment              Frequency  Min 2X/week        Progress Toward Goals  OT Goals(current goals can now be found in the care plan section)  Progress towards OT goals: Progressing toward goals  Acute Rehab OT Goals Patient Stated Goal: go home ASAP OT Goal Formulation: With patient Time For Goal Achievement: 03/02/20 Potential to Achieve Goals: Good ADL Goals Pt Will Perform Grooming: with modified independence;standing;sitting Pt Will Perform Lower Body Dressing: with modified independence;sit to/from stand;sitting/lateral leans Pt Will Transfer to Toilet: with modified independence;ambulating;bedside commode Pt Will Perform Toileting - Clothing Manipulation and hygiene: with modified independence;sitting/lateral leans;sit to/from stand Additional ADL Goal #1: Pt will independently monitor SpO2 and use purse lip breathing for ADLs Additional ADL Goal #2: Pt will independently verbalize three energy conservation techniques for ADLs and IADLs  Plan Discharge plan remains appropriate    Co-evaluation                 AM-PAC OT "6 Clicks" Daily Activity     Outcome Measure   Help from another person eating meals?: None Help from another person taking care of personal grooming?: A Little Help from another person toileting, which includes using toliet, bedpan, or urinal?: A Little Help from another person bathing (including washing, rinsing, drying)?: A Little Help from another person to put on and taking off regular upper body clothing?: A Little Help from another person to put on and taking off regular lower body clothing?: A Little 6 Click Score: 19    End of Session Equipment Utilized During Treatment: Oxygen (4-6L)  OT Visit Diagnosis: Unsteadiness on feet (R26.81);Other abnormalities of gait and mobility (R26.89);Muscle weakness (generalized) (M62.81)   Activity Tolerance Patient tolerated treatment well   Patient Left with  call bell/phone within reach;in chair;with chair alarm set   Nurse Communication Mobility status        Time: 9381-0175 OT Time Calculation (min): 35 min  Charges: OT General Charges $OT Visit: 1 Visit OT Treatments $Self Care/Home Management : 23-37 mins  Jerauld, OTR/L Acute Rehab Pager: [phone] Office: [place] 02/20/2020, 3:00 PM

## 2020-02-21 LAB — COMPREHENSIVE METABOLIC PANEL
ALT: 19 U/L (ref 0–44)
AST: 22 U/L (ref 15–41)
Albumin: 2.5 g/dL — ABNORMAL LOW (ref 3.5–5.0)
Alkaline Phosphatase: 61 U/L (ref 38–126)
Anion gap: 12 (ref 5–15)
BUN: 37 mg/dL — ABNORMAL HIGH (ref 8–23)
CO2: 26 mmol/L (ref 22–32)
Calcium: 8.8 mg/dL — ABNORMAL LOW (ref 8.9–10.3)
Chloride: 101 mmol/L (ref 98–111)
Creatinine, Ser: 0.99 mg/dL (ref 0.61–1.24)
GFR, Estimated: >60 mL/min (ref >60)
Glucose, Bld: 129 mg/dL — ABNORMAL HIGH (ref 70–99)
Potassium: 4.2 mmol/L (ref 3.5–5.1)
Sodium: 139 mmol/L (ref 135–145)
Total Bilirubin: 0.8 mg/dL (ref 0.3–1.2)
Total Protein: 6.5 g/dL (ref 6.5–8.1)

## 2020-02-21 LAB — CBC
HCT: 39.3 % (ref 39.0–52.0)
Hemoglobin: 13.2 g/dL (ref 13.0–17.0)
MCH: 33.1 pg (ref 26.0–34.0)
MCHC: 33.6 g/dL (ref 30.0–36.0)
MCV: 98.5 fL (ref 80.0–100.0)
Platelets: 206 10*3/uL (ref 150–400)
RBC: 3.99 MIL/uL — ABNORMAL LOW (ref 4.22–5.81)
RDW: 13.6 % (ref 11.5–15.5)
WBC: 10.3 10*3/uL (ref 4.0–10.5)
nRBC: 0 % (ref 0.0–0.2)

## 2020-02-21 LAB — GLUCOSE, CAPILLARY
Glucose-Capillary: 138 mg/dL — ABNORMAL HIGH (ref 70–99)
Glucose-Capillary: 161 mg/dL — ABNORMAL HIGH (ref 70–99)
Glucose-Capillary: 206 mg/dL — ABNORMAL HIGH (ref 70–99)
Glucose-Capillary: 144 mg/dL — ABNORMAL HIGH (ref 70–99)

## 2020-02-21 LAB — C-REACTIVE PROTEIN: CRP: 0.7 mg/dL (ref <1.0)

## 2020-02-21 LAB — D-DIMER, QUANTITATIVE: D-Dimer, Quant: 1.25 ug/mL-FEU — ABNORMAL HIGH (ref 0.00–0.50)

## 2020-02-21 NOTE — Progress Notes (Signed)
PROGRESS NOTE                                                                                                                                                                                                             Patient Demographics:    Melvin Foley, is a 74 y.o. male, DOB - Dec 23, 1945, ZOX:[id number]  Outpatient Primary MD for the patient is Wallie Char, FNP   Admit date - 02/15/2020   LOS - 6  Chief Complaint  Patient presents with  . Covid Positive  . Pneumonia       Brief Narrative: Patient is a 74 y.o. male with PMHx of stage IIIb adenocarcinoma-finished radiation approximately 6 weeks back (follows at [facility])-recently hospitalized from 10/22-10/25 COVID-19 pneumonia-did not require O2 on discharge-presented back to the ED on 10/30 with shortness of breath-found to have severe hypoxic respiratory failure due to ongoing COVID-19 pneumonitis.  COVID-19 vaccinated status: Unvaccinated   Significant Events: 10/18>> evaluated at [facility] for hypotension/possible pneumonia-refused admission-Covid 19 negative 10/22-10/25>> hospitalization for Covid-discharged on room air. 10/30>> readmitted with severe hypoxemia-due to ongoing COVID-19 pneumonitis.  Significant studies: 10/22>>Chest x-ray: Patchy right mid/lower lung opacities concerning for pneumonia 10/22>> CTA chest: New airspace disease in the right upper/right lower lobe/left lower lobe-rounded area of masslike consolidation in the right lower lobe with cavitation again noted. 10/31>> CTA chest: No PE, worsening peripheral groundglass opacities, subacute right sided rib fractures along the seventh through ninth ribs.  COVID-19 medications: Steroids: 10/22>> Remdesivir: 10/22>>10/26 Baricitinib: 10/31>>  Antibiotics: Vancomycin: 10/30>> 10/31 Cefepime: 10/30>> 10/31  Microbiology data: 10/22 >>blood culture: No growth 10/30>>  blood culture: No growth  Procedures: None  Consults: None  DVT prophylaxis: enoxaparin (LOVENOX) injection 40 mg Start: 02/15/20 2300    Subjective:   Significant improvement in the hypoxemia overnight-at rest down to 2 L of oxygen-still with significant shortness of breath with minimal ambulation.  Some dried blood in the left anterior nare but no active epistaxis.   Assessment  & Plan :   Acute hypoxic respiratory failure secondary to Covid 19 Viral pneumonia: Improving-Down to 2 L of oxygen at rest (at one point was on 12 L of HFNC).  Still with significant amount of exertional dyspnea.  Continue steroids/baricitinib-no signs of volume overload-but on oral Lasix to maintain negative balance.  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   Fever: afebrile O2 requirements:  SpO2: 91 % O2 Flow Rate (L/min): 2 L/min   COVID-19 Labs: Recent Labs    02/19/20 0054 02/20/20 0304 02/21/20 0747  DDIMER 1.54* 1.51* 1.25*  FERRITIN 1,346* 1,447*  --   CRP 1.1* 1.1* 0.7       Component Value Date/Time   BNP 118.2 (H) 02/15/2020 1243    Recent Labs  Lab 02/15/20 1426  PROCALCITON 0.11    Lab Results  Component Value Date   SARSCOV2NAA POSITIVE (A) 02/07/2020   SARSCOV2NAA NEGATIVE 02/03/2020   [place] NEGATIVE 12/20/2019   [place] NEGATIVE 12/14/2019     Prone/Incentive Spirometry: encouraged  incentive spirometry use 3-4/hour.  Epistaxis-mild-resolved-treated with Afrin nasal spray.  Continue monitoring.  COPD: No evidence of flare-continue bronchodilators  Chronic diastolic heart failure: Euvolemic-on Lasix-follow closely.  History of prior CVA/carotid endarterectomy: Continue Plavix.  Stage IIIb adenocarcinoma of the lung: Claims recently completed RTX at [facility] 6 weeks back-before  that he was on chemo at the [facility] hospital.  CT chest continues to show right lower lobe mass.  Follow with oncology post discharge. However per spouse on 11/2-patient has made up his mind not to pursue any further treatment for this cancer.  Spouse is requesting palliative care services on discharge-Case management informed.  Incidental finding of right rib fractures: Supportive care.  Does not have significant pain-apparently did fall off his bed (soft fall per wife) approximately a week back-per spouse-she was told that he is going to have weak ribs due to radiation.  Schizophrenia: Appears stable-continue thiothixene   Debility/deconditioning: Suspect has significant amount of debility/deconditioning at baseline but has more worsening due to acute illness-PT/OT eval completed-home health recommended-have ordered  Palliative care/goals of care discussion: Understands that he is severely weak with extensive parenchymal lung injury due to COVID-19 pneumonia-he reconfirms that he does not want to be intubated in case he worsens-but does want to pursue CPR for arrhythmias etc. He understood that he remained at significant risk for further decompensation from COVID-19 pneumonia-but thankfully with supportive care he has seemed to improve.  DO NOT INTUBATE in place-Per spouse-patient not keen on resuming cancer treatment-and she is requesting involvement of palliative care on discharge.   ABG:    Component Value Date/Time   PHART 7.477 (H) 02/15/2020 1314   PCO2ART 26.3 (L) 02/15/2020 1314   PO2ART 114 (H) 02/15/2020 1314   HCO3 19.4 (L) 02/15/2020 1314   TCO2 20 (L) 02/15/2020 1314   ACIDBASEDEF 2.0 02/15/2020 1314   O2SAT 99.0 02/15/2020 1314    Vent Settings: N/A    Condition -Guarded  Family Communication  : Spouse-[phone]-on 11/5  Code Status :  DNI  Diet :  Diet Order            DIET DYS 3 Room service appropriate? Yes; Fluid consistency: Thin  Diet effective now                   Disposition Plan  :   Status is: Inpatient  Remains inpatient appropriate because:Inpatient level of care appropriate due to severity of illness  Dispo: The patient is from: Home              Anticipated d/c is to: Home              Anticipated d/c date is: >  3 days              Patient currently is not medically stable to d/c.   Barriers to discharge: Hypoxia requiring O2 supplementation  Antimicorbials  :    Anti-infectives (From admission, onward)   Start     Dose/Rate Route Frequency Ordered Stop   02/16/20 1000  ceFEPIme (MAXIPIME) 2 g in sodium chloride 0.9 % 100 mL IVPB  Status:  Discontinued        2 g 200 mL/hr over 30 Minutes Intravenous Every 12 hours 02/15/20 1951 02/16/20 1105   02/16/20 0800  vancomycin (VANCOREADY) IVPB 500 mg/100 mL  Status:  Discontinued        500 mg 100 mL/hr over 60 Minutes Intravenous Every 12 hours 02/15/20 1844 02/16/20 1105   02/15/20 1845  ceFEPIme (MAXIPIME) 2 g in sodium chloride 0.9 % 100 mL IVPB        2 g 200 mL/hr over 30 Minutes Intravenous  Once 02/15/20 1839 02/15/20 1949   02/15/20 1845  vancomycin (VANCOREADY) IVPB 1500 mg/300 mL        1,500 mg 150 mL/hr over 120 Minutes Intravenous  Once 02/15/20 1844 02/15/20 2152      Inpatient Medications  Scheduled Meds: . vitamin C  500 mg Oral Daily  . baricitinib  4 mg Oral Daily  . benzonatate  200 mg Oral TID  . clopidogrel  75 mg Oral QHS  . enoxaparin (LOVENOX) injection  40 mg Subcutaneous Q24H  . feeding supplement  237 mL Oral TID BM  . fluticasone  2 spray Each Nare Daily  . fluticasone furoate-vilanterol  1 puff Inhalation Daily  . furosemide  40 mg Oral Daily  . gemfibrozil  600 mg Oral BID AC  . guaiFENesin  600 mg Oral BID  . mouth rinse  15 mL Mouth Rinse BID  . methylPREDNISolone (SOLU-MEDROL) injection  50 mg Intravenous Q12H  . oxymetazoline  3 spray Each Nare BID  . pantoprazole  40 mg Oral Daily  . rosuvastatin  40 mg Oral Daily  . sodium  chloride flush  3 mL Intravenous Q12H  . thiothixene  15 mg Oral QHS  . zinc sulfate  220 mg Oral Daily   Continuous Infusions: . lactated ringers Stopped (02/16/20 1900)   PRN Meds:.acetaminophen, albuterol, alum & mag hydroxide-simeth, chlorpheniramine-HYDROcodone, HYDROcodone-acetaminophen, morphine injection, ondansetron **OR** ondansetron (ZOFRAN) IV, sodium chloride   Time Spent in minutes  25  See all Orders from today for further details   Oren Binet M.D on 02/21/2020 at 11:32 AM  To page go to [email or web address] - use universal password  [facility] -  Office  [phone]    Objective:   Vitals:   02/20/20 1950 02/20/20 2353 02/21/20 0409 02/21/20 0700  BP: 97/72 94/75 102/65 91/60  Pulse: 83 77 87 82  Resp: 19 19 20 20   Temp: 98.3 F (36.8 C) 98.1 F (36.7 C) 97.8 F (36.6 C) 98.1 F (36.7 C)  TempSrc: Oral Oral Oral Oral  SpO2: 98% 100% 90% 91%  Weight:      Height:        Wt Readings from Last 3 Encounters:  02/16/20 67 kg  02/08/20 67.1 kg  02/03/20 69.7 kg     Intake/Output Summary (Last 24 hours) at 02/21/2020 1132 Last data filed at 02/21/2020 0900 Gross per 24 hour  Intake 420 ml  Output --  Net 420 ml     Physical Exam Gen Exam:Alert awake-not in any  distress HEENT:atraumatic, normocephalic Chest: B/L clear to auscultation anteriorly CVS:S1S2 regular Abdomen:soft non tender, non distended Extremities:no edema Neurology: Non focal Skin: no rash   Data Review:    CBC Recent Labs  Lab 02/15/20 2321 02/15/20 2321 02/17/20 0424 02/18/20 0141 02/19/20 0054 02/20/20 0304 02/21/20 0747  WBC 5.6   < > 5.2 11.4* 8.9 11.5* 10.3  HGB 12.9*   < > 11.7* 11.7* 11.9* 12.7* 13.2  HCT 39.7   < > 35.5* 34.7* 35.0* 37.3* 39.3  PLT 188   < > 188 186 195 197 206  MCV 101.5*   < > 99.4 97.5 98.9 97.9 98.5  MCH 33.0   < > 32.8 32.9 33.6 33.3 33.1  MCHC 32.5   < > 33.0 33.7 34.0 34.0 33.6  RDW 14.4   < > 13.8 13.6 13.8 13.6 13.6   LYMPHSABS 0.3*  --  0.3* 0.2* 0.2* 0.2*  --   MONOABS 0.7  --  0.2 0.4 0.3 0.4  --   EOSABS 0.0  --  0.0 0.0 0.0 0.0  --   BASOSABS 0.0  --  0.0 0.0 0.0 0.0  --    < > = values in this interval not displayed.    Chemistries  Recent Labs  Lab 02/15/20 2321 02/15/20 2321 02/17/20 0424 02/18/20 0141 02/19/20 0054 02/20/20 0304 02/21/20 0747  NA 132*   < > 136 135 134* 133* 139  K 3.8   < > 4.2 4.3 4.3 4.2 4.2  CL 104   < > 106 103 102 98 101  CO2 21*   < > 21* 23 23 25 26   GLUCOSE 95   < > 169* 176* 139* 156* 129*  BUN 27*   < > 22 26* 28* 33* 37*  CREATININE 0.98   < > 0.91 0.90 0.82 1.12 0.99  CALCIUM 8.0*   < > 8.3* 8.7* 8.6* 8.6* 8.8*  MG 1.9  --  2.1 1.9 2.1 2.0  --   AST 24   < > 23 29 25 27 22   ALT 16   < > 14 16 17 18 19   ALKPHOS 60   < > 54 56 57 62 61  BILITOT 1.0   < > 0.8 0.6 0.9 0.6 0.8   < > = values in this interval not displayed.   ------------------------------------------------------------------------------------------------------------------ No results for input(s): CHOL, HDL, LDLCALC, TRIG, CHOLHDL, LDLDIRECT in the last 72 hours.  Lab Results  Component Value Date   HGBA1C 5.1 05/01/2017   ------------------------------------------------------------------------------------------------------------------ No results for input(s): TSH, T4TOTAL, T3FREE, THYROIDAB in the last 72 hours.  Invalid input(s): FREET3 ------------------------------------------------------------------------------------------------------------------ Recent Labs    02/19/20 0054 02/20/20 0304  FERRITIN 1,346* 1,447*    Coagulation profile No results for input(s): INR, PROTIME in the last 168 hours.  Recent Labs    02/20/20 0304 02/21/20 0747  DDIMER 1.51* 1.25*    Cardiac Enzymes No results for input(s): CKMB, TROPONINI, MYOGLOBIN in the last 168 hours.  Invalid input(s):  CK ------------------------------------------------------------------------------------------------------------------    Component Value Date/Time   BNP 118.2 (H) 02/15/2020 1243    Micro Results Recent Results (from the past 240 hour(s))  Blood Culture (routine x 2)     Status: None   Collection Time: 02/15/20 12:45 PM   Specimen: BLOOD LEFT FOREARM  Result Value Ref Range Status   Specimen Description BLOOD LEFT FOREARM  Final   Special Requests   Final    BOTTLES DRAWN AEROBIC AND ANAEROBIC Blood Culture  results may not be optimal due to an inadequate volume of blood received in culture bottles   Culture   Final    NO GROWTH 5 DAYS Performed at [facility], [place] [address], [place], [place] [address]    Report Status 02/20/2020 FINAL  Final  Blood Culture (routine x 2)     Status: None   Collection Time: 02/15/20  1:16 PM   Specimen: BLOOD  Result Value Ref Range Status   Specimen Description BLOOD RIGHT ANTECUBITAL  Final   Special Requests   Final    BOTTLES DRAWN AEROBIC AND ANAEROBIC Blood Culture results may not be optimal due to an inadequate volume of blood received in culture bottles   Culture   Final    NO GROWTH 5 DAYS Performed at [facility], [place] [address], [place], [place] [address]    Report Status 02/20/2020 FINAL  Final  MRSA PCR Screening     Status: None   Collection Time: 02/15/20  8:12 PM   Specimen: Nasal Mucosa; Nasopharyngeal  Result Value Ref Range Status   MRSA by PCR NEGATIVE NEGATIVE Final    Comment:        The GeneXpert MRSA Assay (FDA approved for NASAL specimens only), is one component of a comprehensive MRSA colonization surveillance program. It is not intended to diagnose MRSA infection nor to guide or monitor treatment for MRSA infections. Performed at [facility], [place] [address] [place], [place] [address]     Radiology Reports DG Chest 2 View  Result Date: 02/03/2020 CLINICAL DATA:  Hypotension,  shortness of breath EXAM: CHEST - 2 VIEW COMPARISON:  12/20/2019 FINDINGS: Patchy airspace disease in the lower lobes concerning for pneumonia. Heart is normal size. No effusions or pneumothorax. No acute bony abnormality. IMPRESSION: Patchy bilateral lower lobe airspace opacities concerning for pneumonia. Electronically Signed   By: Rolm Baptise M.D.   On: 02/03/2020 17:42   CT Angio Chest PE W and/or Wo Contrast  Result Date: 02/15/2020 CLINICAL DATA:  Worsening shortness of breath. EXAM: CT ANGIOGRAPHY CHEST WITH CONTRAST TECHNIQUE: Multidetector CT imaging of the chest was performed using the standard protocol during bolus administration of intravenous contrast. Multiplanar CT image reconstructions and MIPs were obtained to evaluate the vascular anatomy. CONTRAST:  22mL OMNIPAQUE IOHEXOL 350 MG/ML SOLN COMPARISON:  CT dated 02/07/2020 FINDINGS: Cardiovascular: Contrast injection is sufficient to demonstrate satisfactory opacification of the pulmonary arteries to the segmental level. There is no pulmonary embolus or evidence of right heart strain. The size of the main pulmonary artery is normal. Normal heart size with coronary artery calcification. The course and caliber of the aorta are normal. There is mild atherosclerotic calcification. Opacification decreased due to pulmonary arterial phase contrast bolus timing. Mediastinum/Nodes: -- No mediastinal lymphadenopathy. -- No hilar lymphadenopathy. -- No axillary lymphadenopathy. -- No supraclavicular lymphadenopathy. -- Normal thyroid gland where visualized. -  Unremarkable esophagus. Lungs/Pleura: Again noted is a cavitary mass in the right lower lobe similar to prior study. There are persistent ground-glass airspace opacities in the right upper lobe which are essentially stable from prior study. There are worsening peripheral ground-glass airspace opacities in the left lower and left upper lobe. There is no pneumothorax. No large pleural effusion. Upper  Abdomen: Contrast bolus timing is not optimized for evaluation of the abdominal organs. The visualized portions of the organs of the upper abdomen are normal. Musculoskeletal: There are subacute right-sided rib fractures involving the seventh through ninth ribs. Review  of the MIP images confirms the above findings. IMPRESSION: 1. No evidence for acute pulmonary embolus. 2. Worsening peripheral ground-glass airspace opacities in the left lower and left upper lobe, concerning for pneumonia (viral or bacterial) persistent cavitary mass in the right lower lobe. 3. Subacute right-sided rib fractures involving the seventh through ninth ribs. Aortic Atherosclerosis (ICD10-I70.0). Electronically Signed   By: Constance Holster M.D.   On: 02/15/2020 17:36   CT Angio Chest PE W and/or Wo Contrast  Result Date: 02/07/2020 CLINICAL DATA:  Positive D-dimer, shortness of breath, tachycardia. EXAM: CT ANGIOGRAPHY CHEST WITH CONTRAST TECHNIQUE: Multidetector CT imaging of the chest was performed using the standard protocol during bolus administration of intravenous contrast. Multiplanar CT image reconstructions and MIPs were obtained to evaluate the vascular anatomy. CONTRAST:  173mL OMNIPAQUE IOHEXOL 350 MG/ML SOLN COMPARISON:  12/20/2019 FINDINGS: Cardiovascular: No filling defects in the pulmonary arteries to suggest pulmonary emboli. Heart is normal size. Aortic and coronary artery calcifications. No aneurysm. Mediastinum/Nodes: Stable borderline sized right hilar lymph node. Scattered small and borderline sized mediastinal lymph nodes, stable. Trachea and esophagus are unremarkable. Thyroid unremarkable. Lungs/Pleura: Trace right pleural effusion. Masslike consolidation in the right lower lobe is again noted and is stable since prior study. New rounded masslike area of consolidation in the right upper lobe measuring 2.5 cm. Increasing airspace disease posteriorly and peripherally in the right upper lobe and superior  segment of the right lower lobe. Increasing ground-glass airspace opacity posteriorly in the superior segment of the left lower lobe. Paraseptal emphysema. Peripheral reticulation in the lungs bilaterally compatible with fibrosis. None none Upper Abdomen: Imaging into the upper abdomen demonstrates no acute findings. Musculoskeletal: Chest wall soft tissues are unremarkable. No acute bony abnormality. Review of the MIP images confirms the above findings. IMPRESSION: Rounded area of masslike consolidation in the right lower lobe with cavitation again noted, unchanged. New airspace disease seen in the right upper lobe and right lower lobe as well as superior segment of the left lower lobe. These areas are concerning for pneumonia. Areas of peripheral interstitial prominence and fibrosis bilaterally. Right hilar and borderline mediastinal lymph nodes are stable. Coronary artery disease. Aortic Atherosclerosis (ICD10-I70.0). Electronically Signed   By: Rolm Baptise M.D.   On: 02/07/2020 20:24   DG Chest Port 1 View  Result Date: 02/15/2020 CLINICAL DATA:  Cough, worsening dyspnea. EXAM: PORTABLE CHEST 1 VIEW COMPARISON:  02/07/2020 FINDINGS: Improved aeration at the right lung base compared to the prior examination. Interstitial thickening and chronic changes at the left lung base are unchanged. Heart and mediastinum are grossly stable. IMPRESSION: 1. Improved aeration at the right lung base compared to the recent comparison examination. 2. Chronic changes. Electronically Signed   By: Markus Daft M.D.   On: 02/15/2020 13:16   DG Chest Portable 1 View  Result Date: 02/07/2020 CLINICAL DATA:  Shortness of breath, cough, congestion EXAM: PORTABLE CHEST 1 VIEW COMPARISON:  02/03/2020 FINDINGS: Airspace disease in the right mid and lower lung again noted, most confluent at the right lung base compatible with pneumonia. Heart is normal size. Left lung clear. Previously seen patchy opacities at the left base have  resolved. No effusions or acute bony abnormality. IMPRESSION: Continued patchy right mid and lower lung airspace opacities concerning for pneumonia. Clearing of the left basilar opacities since prior study. Electronically Signed   By: Rolm Baptise M.D.   On: 02/07/2020 18:51   VAS [place] LOWER EXTREMITY VENOUS (DVT)  Result Date: 02/10/2020  Lower Venous DVT  Study Indications: Elevated Ddimer.  Risk Factors: COVID 19 positive. Comparison Study: No prior studies. Performing Technologist: Oliver Hum RVT  Examination Guidelines: A complete evaluation includes B-mode imaging, spectral Doppler, color Doppler, and power Doppler as needed of all accessible portions of each vessel. Bilateral testing is considered an integral part of a complete examination. Limited examinations for reoccurring indications may be performed as noted. The reflux portion of the exam is performed with the patient in reverse Trendelenburg.  +---------+---------------+---------+-----------+----------+--------------+ RIGHT    CompressibilityPhasicitySpontaneityPropertiesThrombus Aging +---------+---------------+---------+-----------+----------+--------------+ CFV      Full           Yes      Yes                                 +---------+---------------+---------+-----------+----------+--------------+ SFJ      Full                                                        +---------+---------------+---------+-----------+----------+--------------+ FV Prox  Full                                                        +---------+---------------+---------+-----------+----------+--------------+ FV Mid   Full                                                        +---------+---------------+---------+-----------+----------+--------------+ FV DistalFull                                                        +---------+---------------+---------+-----------+----------+--------------+ PFV      Full                                                         +---------+---------------+---------+-----------+----------+--------------+ POP      Full           Yes      Yes                                 +---------+---------------+---------+-----------+----------+--------------+ PTV      Full                                                        +---------+---------------+---------+-----------+----------+--------------+ PERO     Full                                                        +---------+---------------+---------+-----------+----------+--------------+   +---------+---------------+---------+-----------+----------+--------------+  LEFT     CompressibilityPhasicitySpontaneityPropertiesThrombus Aging +---------+---------------+---------+-----------+----------+--------------+ CFV      Full           Yes      Yes                                 +---------+---------------+---------+-----------+----------+--------------+ SFJ      Full                                                        +---------+---------------+---------+-----------+----------+--------------+ FV Prox  Full                                                        +---------+---------------+---------+-----------+----------+--------------+ FV Mid   Full                                                        +---------+---------------+---------+-----------+----------+--------------+ FV DistalFull                                                        +---------+---------------+---------+-----------+----------+--------------+ PFV      Full                                                        +---------+---------------+---------+-----------+----------+--------------+ POP      Full           Yes      Yes                                 +---------+---------------+---------+-----------+----------+--------------+ PTV      Full                                                         +---------+---------------+---------+-----------+----------+--------------+ PERO     Full                                                        +---------+---------------+---------+-----------+----------+--------------+     Summary: RIGHT: - There is no evidence of deep vein thrombosis in the lower extremity.  - No cystic structure found in the popliteal fossa.  LEFT: - There is no evidence of deep vein thrombosis in the lower extremity.  - No  cystic structure found in the popliteal fossa.  *See table(s) above for measurements and observations. Electronically signed by Ruta Hinds MD on 02/10/2020 at 6:25:28 PM.    Final    US Abdomen Limited RUQ (LIVER/GB)  Result Date: 02/15/2020 CLINICAL DATA:  Right upper quadrant abdominal pain. History of prior cholecystectomy. EXAM: ULTRASOUND ABDOMEN LIMITED RIGHT UPPER QUADRANT COMPARISON:  None. FINDINGS: Gallbladder: The gallbladder is surgically absent. Common bile duct: Diameter: 4.9 mm Liver: No focal lesion identified. Within normal limits in parenchymal echogenicity. Portal vein is patent on color Doppler imaging with normal direction of blood flow towards the liver. Other: The study is limited secondary to overlying bowel gas. IMPRESSION: 1. Findings consistent with prior cholecystectomy. 2. Otherwise, unremarkable right upper quadrant ultrasound. Electronically Signed   By: Virgina Norfolk M.D.   On: 02/15/2020 21:02

## 2020-02-21 NOTE — Progress Notes (Signed)
[facility] ([facility])  Hospital Liaison: RN note         Notified by TOC manager of patient/family request for [facility] Palliative services at home after discharge.                  [facility] Palliative team will follow up with patient after discharge.         Please call with any hospice or palliative related questions.         Thank you for this referral.         Mary Anne Robertson, RN, CCM  [facility] Hospital Liaison (listed on [facility] under [facility]osp[facility])    [phone]   

## 2020-02-21 NOTE — Progress Notes (Signed)
Physical Therapy Treatment Patient Details Name: Melvin Foley MRN: [id number] DOB: [birth year] Today's Date: 02/21/2020    History of Present Illness 74 y.o. male with medical history significant of lung cancer (finished radiation 6 weeks ago), COPD, chronic diastolic CHF, CVA, schizophrenia, presented to ED 10/30 with known COVID infection (+10/22 with hospitalization 10/22-10/25) and worsening dyspnea. CTA negative for PE    PT Comments    Pt received in bed. SpO2 93% at rest on 2L. Pt declining OOB to recliner stating it hurts his back. Agreeable to LE exercises in bed. Assisted to stand bedside to urinate in [facility]. Pt required min assist supine to sit, min guard assist sit to stand, min guard assist static stand, and min guard assist sit to supine. Pt declining further exercise or mobility upon return to bed. Desat to 78% during static stand, RR 50 max HR 125. Increase to 4L and 3-5 minute recovery time to attain 90% SpO2, RR 26 and HR 92. Pt then returned to 2L.     Follow Up Recommendations  Home health PT;Supervision/Assistance - 24 hour     Equipment Recommendations  None recommended by PT    Recommendations for Other Services       Precautions / Restrictions Precautions Precautions: Fall    Mobility  Bed Mobility Overal bed mobility: Needs Assistance Bed Mobility: Supine to Sit;Sit to Supine     Supine to sit: Min assist Sit to supine: Min guard   General bed mobility comments: assist to elevate trunk, +rail  Transfers Overall transfer level: Needs assistance Equipment used: None Transfers: Sit to/from Stand Sit to Stand: Min guard         General transfer comment: min guard for safety  Ambulation/Gait                 Stairs             Wheelchair Mobility    Modified Rankin (Stroke Patients Only)       Balance Overall balance assessment: Needs assistance Sitting-balance support: No upper extremity supported;Feet  supported Sitting balance-Leahy Scale: Good     Standing balance support: No upper extremity supported;During functional activity Standing balance-Leahy Scale: Fair Standing balance comment: Static stand min guard assist to urinate in [facility].                            Cognition Arousal/Alertness: Awake/alert Behavior During Therapy: Flat affect Overall Cognitive Status: Impaired/Different from baseline Area of Impairment: Attention;Following commands;Awareness;Problem solving;Safety/judgement                   Current Attention Level: Sustained   Following Commands: Follows one step commands inconsistently;Follows one step commands with increased time Safety/Judgement: Decreased awareness of safety Awareness: Intellectual Problem Solving: Slow processing;Difficulty sequencing;Requires verbal cues        Exercises General Exercises - Lower Extremity Ankle Circles/Pumps: AROM;Both;10 reps;Supine Heel Slides: AROM;Right;Left;10 reps;Supine    General Comments General comments (skin integrity, edema, etc.): SpO2 93% at rest on 2L. Desat to 78% during mobility requiring 4L and 3-5 minute recovery time to improve to 90%. Back to 2L at end of session with SpO2 88%.      Pertinent Vitals/Pain Pain Assessment: Faces Faces Pain Scale: Hurts little more Pain Location: back Pain Descriptors / Indicators: Discomfort;Sore Pain Intervention(s): Monitored during session;Limited activity within patient's tolerance;Repositioned    Home Living  Prior Function            PT Goals (current goals can now be found in the care plan section) Acute Rehab PT Goals Patient Stated Goal: go home ASAP Progress towards PT goals: Progressing toward goals    Frequency    Min 3X/week      PT Plan Current plan remains appropriate    Co-evaluation              AM-PAC PT "6 Clicks" Mobility   Outcome Measure  Help needed turning from  your back to your side while in a flat bed without using bedrails?: None Help needed moving from lying on your back to sitting on the side of a flat bed without using bedrails?: A Little Help needed moving to and from a bed to a chair (including a wheelchair)?: A Little Help needed standing up from a chair using your arms (e.g., wheelchair or bedside chair)?: A Little Help needed to walk in hospital room?: A Little Help needed climbing 3-5 steps with a railing? : A Little 6 Click Score: 19    End of Session Equipment Utilized During Treatment: Oxygen Activity Tolerance: Patient limited by pain;Patient limited by fatigue Patient left: in bed;with call bell/phone within reach Nurse Communication: Mobility status PT Visit Diagnosis: Difficulty in walking, not elsewhere classified (R26.2);Muscle weakness (generalized) (M62.81)     Time: 8466-5993 PT Time Calculation (min) (ACUTE ONLY): 18 min  Charges:  $Therapeutic Activity: 8-22 mins                     Lorrin Goodell, PT  Office # [phone] Pager [phone]    Melvin Foley 02/21/2020, 1:20 PM

## 2020-02-21 NOTE — TOC Progression Note (Addendum)
Transition of Care [facility]) - Progression Note    Patient Details  Name: LESLEY GALENTINE MRN: [id number] Date of Birth: [birth year]  Transition [facility] CM/SW [place], RN Phone Number: 02/21/2020, 10:28 AM  Clinical Narrative:    Patient improving little by little, on 3LPM high flow, had some epitaxies, requiring mask oxygen.  Has to increase to &l on ambulation. Patient has home oxygen set up already from [place]. Called and left message with Delana Meyer at the [place] [facility] is Cyndee Brightly [phone] ext [phone] Pager [phone], to ask her about set up for pallative care. Patient and wife interested in Palliative . Called wife, left confidential message to return call.   75 wife said she would be available to speak after 330p, will touch base with her then  1422 orders in for DME Palliative and oxygen. Faxed to [place]. Called hospital liaison for [facility] to discuss patient.  Patient is already active with [facility], Already has oxygen at home. But may need a increase.  Will ask when this CM speaks to wife..  65 Spoke to wife she is glad for palliative consults, and that he will be seen at home, she is worried that the DME will not arrive  In a good timeframe from [place]. Awaiting to hear from [facility] from the [place] CSW.for patient.   Expected Discharge Plan: [place] Barriers to Discharge: Continued Medical Work up  Expected Discharge Plan and Services Expected Discharge Plan: [place]   Discharge Planning Services: CM Consult Post Acute Care Choice: [place]: [place] ([facility]) Date [place]: 02/19/20 Time [place]: [phone] Representative spoke with at [place]: [place] Determinants of Health ([place]) Interventions    Readmission Risk Interventions No flowsheet data found.

## 2020-02-21 NOTE — Plan of Care (Signed)
  Problem: Health Behavior/Discharge Planning: Goal: Ability to manage health-related needs will improve Outcome: Progressing   Problem: Clinical Measurements: Goal: Will remain free from infection Outcome: Progressing Goal: Diagnostic test results will improve Outcome: Progressing   Problem: Respiratory: Goal: Ability to maintain adequate ventilation will improve Outcome: Progressing

## 2020-02-22 DIAGNOSIS — J069 Acute upper respiratory infection, unspecified: Secondary | ICD-10-CM | POA: Diagnosis not present

## 2020-02-22 DIAGNOSIS — Z515 Encounter for palliative care: Secondary | ICD-10-CM

## 2020-02-22 DIAGNOSIS — U071 COVID-19: Secondary | ICD-10-CM

## 2020-02-22 LAB — COMPREHENSIVE METABOLIC PANEL
ALT: 22 U/L (ref 0–44)
AST: 22 U/L (ref 15–41)
Albumin: 2.7 g/dL — ABNORMAL LOW (ref 3.5–5.0)
Alkaline Phosphatase: 72 U/L (ref 38–126)
Anion gap: 14 (ref 5–15)
BUN: 50 mg/dL — ABNORMAL HIGH (ref 8–23)
CO2: 29 mmol/L (ref 22–32)
Calcium: 9 mg/dL (ref 8.9–10.3)
Chloride: 97 mmol/L — ABNORMAL LOW (ref 98–111)
Creatinine, Ser: 1.08 mg/dL (ref 0.61–1.24)
GFR, Estimated: >60 mL/min (ref >60)
Glucose, Bld: 161 mg/dL — ABNORMAL HIGH (ref 70–99)
Potassium: 4.1 mmol/L (ref 3.5–5.1)
Sodium: 140 mmol/L (ref 135–145)
Total Bilirubin: 1.1 mg/dL (ref 0.3–1.2)
Total Protein: 6.8 g/dL (ref 6.5–8.1)

## 2020-02-22 LAB — CBC
HCT: 41.9 % (ref 39.0–52.0)
Hemoglobin: 14.1 g/dL (ref 13.0–17.0)
MCH: 33.2 pg (ref 26.0–34.0)
MCHC: 33.7 g/dL (ref 30.0–36.0)
MCV: 98.6 fL (ref 80.0–100.0)
Platelets: 245 10*3/uL (ref 150–400)
RBC: 4.25 MIL/uL (ref 4.22–5.81)
RDW: 13.6 % (ref 11.5–15.5)
WBC: 11.3 10*3/uL — ABNORMAL HIGH (ref 4.0–10.5)
nRBC: 0 % (ref 0.0–0.2)

## 2020-02-22 LAB — C-REACTIVE PROTEIN: CRP: 0.6 mg/dL (ref <1.0)

## 2020-02-22 LAB — GLUCOSE, CAPILLARY
Glucose-Capillary: 188 mg/dL — ABNORMAL HIGH (ref 70–99)
Glucose-Capillary: 134 mg/dL — ABNORMAL HIGH (ref 70–99)
Glucose-Capillary: 173 mg/dL — ABNORMAL HIGH (ref 70–99)
Glucose-Capillary: 158 mg/dL — ABNORMAL HIGH (ref 70–99)

## 2020-02-22 LAB — D-DIMER, QUANTITATIVE: D-Dimer, Quant: 1.86 ug/mL-FEU — ABNORMAL HIGH (ref 0.00–0.50)

## 2020-02-22 MED ORDER — OXYMETAZOLINE HCL 0.05 % NA SOLN
1.0000 | Freq: Two times a day (BID) | NASAL | Status: DC
  Administered 2020-02-22 – 2020-02-28 (×8): 1 via NASAL
  Filled 2020-02-22: qty 30

## 2020-02-22 MED ORDER — HYDROCODONE-ACETAMINOPHEN 5-325 MG PO TABS: 1.0000 | ORAL_TABLET | Freq: Four times a day (QID) | ORAL | Status: DC | PRN

## 2020-02-22 MED ORDER — LACTATED RINGERS IV SOLN: INTRAVENOUS | Status: AC

## 2020-02-22 MED ORDER — METHYLPREDNISOLONE SODIUM SUCC 40 MG IJ SOLR
40.0000 mg | Freq: Every day | INTRAMUSCULAR | Status: DC
  Administered 2020-02-22 – 2020-02-26 (×5): 40 mg via INTRAVENOUS
  Filled 2020-02-22 (×5): qty 1

## 2020-02-22 NOTE — Progress Notes (Addendum)
PROGRESS NOTE                                                                                                                                                                                                             Patient Demographics:    Melvin Foley, is a 74 y.o. male, DOB - 1945/09/02, TIR:[id number]  Outpatient Primary MD for the patient is Wallie Char, FNP   Admit date - 02/15/2020   LOS - 7  Chief Complaint  Patient presents with  . Covid Positive  . Pneumonia       Brief Narrative: Patient is a 74 y.o. male with PMHx of stage IIIb adenocarcinoma-finished radiation approximately 6 weeks back (follows at [facility])-recently hospitalized from 10/22-10/25 COVID-19 pneumonia-did not require O2 on discharge-presented back to the ED on 10/30 with shortness of breath-found to have severe hypoxic respiratory failure due to ongoing COVID-19 pneumonitis.  COVID-19 vaccinated status: Unvaccinated   Significant Events: 10/18>> evaluated at [facility] for hypotension/possible pneumonia-refused admission-Covid 19 negative 10/22-10/25>> hospitalization for Covid-discharged on room air. 10/30>> readmitted with severe hypoxemia-due to ongoing COVID-19 pneumonitis.  Significant studies: 10/22>>Chest x-ray: Patchy right mid/lower lung opacities concerning for pneumonia 10/22>> CTA chest: New airspace disease in the right upper/right lower lobe/left lower lobe-rounded area of masslike consolidation in the right lower lobe with cavitation again noted. 10/31>> CTA chest: No PE, worsening peripheral groundglass opacities, subacute right sided rib fractures along the seventh through ninth ribs.  COVID-19 medications: Steroids: 10/22>> Remdesivir: 10/22>>10/26 Baricitinib: 10/31>>  Antibiotics: Vancomycin: 10/30>> 10/31 Cefepime: 10/30>> 10/31  Microbiology data: 10/22 >>blood culture: No growth 10/30>>  blood culture: No growth  Procedures: None  Consults: None  DVT prophylaxis: enoxaparin (LOVENOX) injection 40 mg Start: 02/15/20 2300    Subjective:   Patient in bed, appears comfortable, denies any headache, no fever, no chest pain or pressure, no shortness of breath , no abdominal pain. No focal weakness.    Assessment  & Plan :   Acute hypoxic respiratory failure secondary to Covid 19 Viral pneumonia: Improving-Down to 2 L of oxygen at rest (at one point was on 12 L of HFNC).  Still with significant amount of exertional dyspnea.  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   Fever: afebrile O2 requirements:  SpO2: 94 % O2 Flow Rate (L/min): 2 L/min     Recent Labs  Lab 02/15/20 1243 02/15/20 1243 02/15/20 1314 02/15/20 1426 02/15/20 2012 02/15/20 2321 02/17/20 0424 02/18/20 0141 02/19/20 0054 02/20/20 0304 02/21/20 0747 02/22/20 0401  WBC 9.2   < >  --   --   --  5.6   < > 11.4* 8.9 11.5* 10.3 11.3*  HGB 16.9   < >   < >  --   --  12.9*   < > 11.7* 11.9* 12.7* 13.2 14.1  HCT 52.6*   < >   < >  --   --  39.7   < > 34.7* 35.0* 37.3* 39.3 41.9  PLT 308   < >  --   --   --  188   < > 186 195 197 206 245  CRP 7.1*   < >  --   --   --  7.5*   < > 2.5* 1.1* 1.1* 0.7 0.6  BNP 118.2*  --   --   --   --   --   --   --   --   --   --   --   DDIMER  --    < >   < >  --   --  2.77*   < > 1.74* 1.54* 1.51* 1.25* 1.86*  PROCALCITON  --   --   --  0.11  --   --   --   --   --   --   --   --   AST  --    < >  --  31  --  24   < > 29 25 27 22 22   ALT  --    < >  --  17  --  16   < > 16 17 18 19 22   ALKPHOS  --    < >  --  72  --  60   < > 56 57 62 61 72  BILITOT  --    < >  --  1.2  --  1.0   < > 0.6 0.9 0.6 0.8 1.1  ALBUMIN  --    < >  --  3.1*  --  2.6*   < >  2.3* 2.3* 2.5* 2.5* 2.7*  LATICACIDVEN 3.6*  --   --  2.5* 0.9 1.4  --   --   --   --   --   --    < > = values in this interval not displayed.      Prone/Incentive Spirometry: encouraged  incentive spirometry use 3-4/hour.  Epistaxis-mild-resolved-treated with Afrin nasal spray.  Continue monitoring.  COPD: No evidence of flare-continue bronchodilators  Chronic diastolic heart failure: Euvolemic.  Hypotension.  On 02/22/2020, hold further diuretics and hydrate with IV fluids.  History of prior CVA/carotid endarterectomy: Continue Plavix.  Stage IIIb adenocarcinoma of the lung: Claims recently completed RTX at [facility] 6 weeks back-before that he was on chemo at the [facility] hospital.  CT chest continues to show right lower lobe mass.  Follow with oncology post discharge. However per spouse on 11/2-patient has made up his mind not to pursue any further treatment for this cancer.  Spouse is requesting palliative care services on discharge-Case management informed.  Incidental finding of right rib fractures: Supportive  care.  Does not have significant pain-apparently did fall off his bed (soft fall per wife) approximately a week back-per spouse-she was told that he is going to have weak ribs due to radiation.  Schizophrenia: Appears stable-continue thiothixene   Debility/deconditioning: Suspect has significant amount of debility/deconditioning at baseline but has more worsening due to acute illness-PT/OT eval completed-home health recommended-have ordered  Palliative care/goals of care discussion: Understands that he is severely weak with extensive parenchymal lung injury due to COVID-19 pneumonia-he reconfirms that he does not want to be intubated in case he worsens-but does want to pursue CPR for arrhythmias etc. He understood that he remained at significant risk for further decompensation from COVID-19 pneumonia-but thankfully with supportive care he has seemed to improve.  DO NOT  INTUBATE in place-Per spouse-patient not keen on resuming cancer treatment-and she is requesting involvement of palliative care on discharge.     Condition - Guarded  Family Communication  : Spouse-Ruth-[phone]-on 11/5  Code Status :  DNI  Diet :  Diet Order            DIET DYS 3 Room service appropriate? Yes; Fluid consistency: Thin  Diet effective now                  Disposition Plan  :   Status is: Inpatient  Remains inpatient appropriate because:Inpatient level of care appropriate due to severity of illness  Dispo: The patient is from: Home              Anticipated d/c is to: Home              Anticipated d/c date is: > 3 days              Patient currently is not medically stable to d/c.   Barriers to discharge: Hypoxia requiring O2 supplementation  Antimicorbials  :    Anti-infectives (From admission, onward)   Start     Dose/Rate Route Frequency Ordered Stop   02/16/20 1000  ceFEPIme (MAXIPIME) 2 g in sodium chloride 0.9 % 100 mL IVPB  Status:  Discontinued        2 g 200 mL/hr over 30 Minutes Intravenous Every 12 hours 02/15/20 1951 02/16/20 1105   02/16/20 0800  vancomycin (VANCOREADY) IVPB 500 mg/100 mL  Status:  Discontinued        500 mg 100 mL/hr over 60 Minutes Intravenous Every 12 hours 02/15/20 1844 02/16/20 1105   02/15/20 1845  ceFEPIme (MAXIPIME) 2 g in sodium chloride 0.9 % 100 mL IVPB        2 g 200 mL/hr over 30 Minutes Intravenous  Once 02/15/20 1839 02/15/20 1949   02/15/20 1845  vancomycin (VANCOREADY) IVPB 1500 mg/300 mL        1,500 mg 150 mL/hr over 120 Minutes Intravenous  Once 02/15/20 1844 02/15/20 2152      Inpatient Medications  Scheduled Meds: . vitamin C  500 mg Oral Daily  . baricitinib  4 mg Oral Daily  . benzonatate  200 mg Oral TID  . clopidogrel  75 mg Oral QHS  . enoxaparin (LOVENOX) injection  40 mg Subcutaneous Q24H  . feeding supplement  237 mL Oral TID BM  . fluticasone  2 spray Each Nare Daily  .  fluticasone furoate-vilanterol  1 puff Inhalation Daily  . gemfibrozil  600 mg Oral BID AC  . guaiFENesin  600 mg Oral BID  . mouth rinse  15 mL Mouth Rinse BID  .  methylPREDNISolone (SOLU-MEDROL) injection  40 mg Intravenous Daily  . oxymetazoline  1 spray Each Nare BID  . pantoprazole  40 mg Oral Daily  . rosuvastatin  40 mg Oral Daily  . sodium chloride flush  3 mL Intravenous Q12H  . thiothixene  15 mg Oral QHS  . zinc sulfate  220 mg Oral Daily   Continuous Infusions: . lactated ringers     PRN Meds:.acetaminophen, albuterol, alum & mag hydroxide-simeth, chlorpheniramine-HYDROcodone, HYDROcodone-acetaminophen, morphine injection, [DISCONTINUED] ondansetron **OR** ondansetron (ZOFRAN) IV, sodium chloride   Time Spent in minutes  25  See all Orders from today for further details   Lala Lund M.D on 02/22/2020 at 10:23 AM  To page go to [email or web address] - use universal password  [facility] -  Office  [phone]    Objective:   Vitals:   02/21/20 2000 02/22/20 0000 02/22/20 0511 02/22/20 0808  BP: 106/79 96/72 99/78  91/71  Pulse: 81 76 84 85  Resp: (!) 22 20 19 15   Temp: 97.8 F (36.6 C) 97.8 F (36.6 C) 98 F (36.7 C)   TempSrc: Axillary Axillary Axillary   SpO2: 96% 98% 98% 94%  Weight:      Height:        Wt Readings from Last 3 Encounters:  02/16/20 67 kg  02/08/20 67.1 kg  02/03/20 69.7 kg     Intake/Output Summary (Last 24 hours) at 02/22/2020 1023 Last data filed at 02/21/2020 1400 Gross per 24 hour  Intake 240 ml  Output --  Net 240 ml     Physical Exam  Awake Alert, No new F.N deficits, Normal affect [place].AT,PERRAL Supple Neck,No JVD, No cervical lymphadenopathy appriciated.  Symmetrical Chest wall movement, Good air movement bilaterally, CTAB RRR,No Gallops, Rubs or new Murmurs, No Parasternal Heave +ve B.Sounds, Abd Soft, No tenderness, No organomegaly appriciated, No rebound - guarding or rigidity. No Cyanosis, Clubbing or  edema, No new Rash or bruise    Data Review:    CBC Recent Labs  Lab 02/15/20 2321 02/15/20 2321 02/17/20 0424 02/17/20 0424 02/18/20 0141 02/19/20 0054 02/20/20 0304 02/21/20 0747 02/22/20 0401  WBC 5.6   < > 5.2   < > 11.4* 8.9 11.5* 10.3 11.3*  HGB 12.9*   < > 11.7*   < > 11.7* 11.9* 12.7* 13.2 14.1  HCT 39.7   < > 35.5*   < > 34.7* 35.0* 37.3* 39.3 41.9  PLT 188   < > 188   < > 186 195 197 206 245  MCV 101.5*   < > 99.4   < > 97.5 98.9 97.9 98.5 98.6  MCH 33.0   < > 32.8   < > 32.9 33.6 33.3 33.1 33.2  MCHC 32.5   < > 33.0   < > 33.7 34.0 34.0 33.6 33.7  RDW 14.4   < > 13.8   < > 13.6 13.8 13.6 13.6 13.6  LYMPHSABS 0.3*  --  0.3*  --  0.2* 0.2* 0.2*  --   --   MONOABS 0.7  --  0.2  --  0.4 0.3 0.4  --   --   EOSABS 0.0  --  0.0  --  0.0 0.0 0.0  --   --   BASOSABS 0.0  --  0.0  --  0.0 0.0 0.0  --   --    < > = values in this interval not displayed.    Chemistries  Recent Labs  Lab 02/15/20 2321 02/15/20 2321 02/17/20  0865 02/17/20 0424 02/18/20 0141 02/19/20 0054 02/20/20 0304 02/21/20 0747 02/22/20 0401  NA 132*   < > 136   < > 135 134* 133* 139 140  K 3.8   < > 4.2   < > 4.3 4.3 4.2 4.2 4.1  CL 104   < > 106   < > 103 102 98 101 97*  CO2 21*   < > 21*   < > 23 23 25 26 29   GLUCOSE 95   < > 169*   < > 176* 139* 156* 129* 161*  BUN 27*   < > 22   < > 26* 28* 33* 37* 50*  CREATININE 0.98   < > 0.91   < > 0.90 0.82 1.12 0.99 1.08  CALCIUM 8.0*   < > 8.3*   < > 8.7* 8.6* 8.6* 8.8* 9.0  MG 1.9  --  2.1  --  1.9 2.1 2.0  --   --   AST 24   < > 23   < > 29 25 27 22 22   ALT 16   < > 14   < > 16 17 18 19 22   ALKPHOS 60   < > 54   < > 56 57 62 61 72  BILITOT 1.0   < > 0.8   < > 0.6 0.9 0.6 0.8 1.1   < > = values in this interval not displayed.   ------------------------------------------------------------------------------------------------------------------ No results for input(s): CHOL, HDL, LDLCALC, TRIG, CHOLHDL, LDLDIRECT in the last 72 hours.  Lab  Results  Component Value Date   HGBA1C 5.1 05/01/2017   ------------------------------------------------------------------------------------------------------------------ No results for input(s): TSH, T4TOTAL, T3FREE, THYROIDAB in the last 72 hours.  Invalid input(s): FREET3 ------------------------------------------------------------------------------------------------------------------ Recent Labs    02/20/20 0304  FERRITIN 1,447*    Coagulation profile No results for input(s): INR, PROTIME in the last 168 hours.  Recent Labs    02/21/20 0747 02/22/20 0401  DDIMER 1.25* 1.86*    Cardiac Enzymes No results for input(s): CKMB, TROPONINI, MYOGLOBIN in the last 168 hours.  Invalid input(s): CK ------------------------------------------------------------------------------------------------------------------    Component Value Date/Time   BNP 118.2 (H) 02/15/2020 1243    Micro Results Recent Results (from the past 240 hour(s))  Blood Culture (routine x 2)     Status: None   Collection Time: 02/15/20 12:45 PM   Specimen: BLOOD LEFT FOREARM  Result Value Ref Range Status   Specimen Description BLOOD LEFT FOREARM  Final   Special Requests   Final    BOTTLES DRAWN AEROBIC AND ANAEROBIC Blood Culture results may not be optimal due to an inadequate volume of blood received in culture bottles   Culture   Final    NO GROWTH 5 DAYS Performed at [facility], [place] [address], [place], [place] [address]    Report Status 02/20/2020 FINAL  Final  Blood Culture (routine x 2)     Status: None   Collection Time: 02/15/20  1:16 PM   Specimen: BLOOD  Result Value Ref Range Status   Specimen Description BLOOD RIGHT ANTECUBITAL  Final   Special Requests   Final    BOTTLES DRAWN AEROBIC AND ANAEROBIC Blood Culture results may not be optimal due to an inadequate volume of blood received in culture bottles   Culture   Final    NO GROWTH 5 DAYS Performed at [facility],  [place] [address] [place], [place] [address]    Report Status 02/20/2020 FINAL  Final  MRSA PCR Screening     Status: None   Collection Time: 02/15/20  8:12 PM   Specimen: Nasal Mucosa; Nasopharyngeal  Result Value Ref Range Status   MRSA by PCR NEGATIVE NEGATIVE Final    Comment:        The GeneXpert MRSA Assay (FDA approved for NASAL specimens only), is one component of a comprehensive MRSA colonization surveillance program. It is not intended to diagnose MRSA infection nor to guide or monitor treatment for MRSA infections. Performed at [facility], [place] [address] [place], [place] [address]     Radiology Reports DG Chest 2 View  Result Date: 02/03/2020 CLINICAL DATA:  Hypotension, shortness of breath EXAM: CHEST - 2 VIEW COMPARISON:  12/20/2019 FINDINGS: Patchy airspace disease in the lower lobes concerning for pneumonia. Heart is normal size. No effusions or pneumothorax. No acute bony abnormality. IMPRESSION: Patchy bilateral lower lobe airspace opacities concerning for pneumonia. Electronically Signed   By: Rolm Baptise M.D.   On: 02/03/2020 17:42   CT Angio Chest PE W and/or Wo Contrast  Result Date: 02/15/2020 CLINICAL DATA:  Worsening shortness of breath. EXAM: CT ANGIOGRAPHY CHEST WITH CONTRAST TECHNIQUE: Multidetector CT imaging of the chest was performed using the standard protocol during bolus administration of intravenous contrast. Multiplanar CT image reconstructions and MIPs were obtained to evaluate the vascular anatomy. CONTRAST:  82mL OMNIPAQUE IOHEXOL 350 MG/ML SOLN COMPARISON:  CT dated 02/07/2020 FINDINGS: Cardiovascular: Contrast injection is sufficient to demonstrate satisfactory opacification of the pulmonary arteries to the segmental level. There is no pulmonary embolus or evidence of right heart strain. The size of the main pulmonary artery is normal. Normal heart size with coronary artery calcification. The course and caliber of the aorta are normal. There  is mild atherosclerotic calcification. Opacification decreased due to pulmonary arterial phase contrast bolus timing. Mediastinum/Nodes: -- No mediastinal lymphadenopathy. -- No hilar lymphadenopathy. -- No axillary lymphadenopathy. -- No supraclavicular lymphadenopathy. -- Normal thyroid gland where visualized. -  Unremarkable esophagus. Lungs/Pleura: Again noted is a cavitary mass in the right lower lobe similar to prior study. There are persistent ground-glass airspace opacities in the right upper lobe which are essentially stable from prior study. There are worsening peripheral ground-glass airspace opacities in the left lower and left upper lobe. There is no pneumothorax. No large pleural effusion. Upper Abdomen: Contrast bolus timing is not optimized for evaluation of the abdominal organs. The visualized portions of the organs of the upper abdomen are normal. Musculoskeletal: There are subacute right-sided rib fractures involving the seventh through ninth ribs. Review of the MIP images confirms the above findings. IMPRESSION: 1. No evidence for acute pulmonary embolus. 2. Worsening peripheral ground-glass airspace opacities in the left lower and left upper lobe, concerning for pneumonia (viral or bacterial) persistent cavitary mass in the right lower lobe. 3. Subacute right-sided rib fractures involving the seventh through ninth ribs. Aortic Atherosclerosis (ICD10-I70.0). Electronically Signed   By: Constance Holster M.D.   On: 02/15/2020 17:36   CT Angio Chest PE W and/or Wo Contrast  Result Date: 02/07/2020 CLINICAL DATA:  Positive D-dimer, shortness of breath, tachycardia. EXAM: CT ANGIOGRAPHY CHEST WITH CONTRAST TECHNIQUE: Multidetector CT imaging of the chest was performed using the standard protocol during bolus administration of intravenous contrast. Multiplanar CT image reconstructions and MIPs were obtained to evaluate the vascular anatomy. CONTRAST:  153mL OMNIPAQUE IOHEXOL 350 MG/ML SOLN  COMPARISON:  12/20/2019 FINDINGS: Cardiovascular: No filling defects in the pulmonary arteries to suggest pulmonary emboli.  Heart is normal size. Aortic and coronary artery calcifications. No aneurysm. Mediastinum/Nodes: Stable borderline sized right hilar lymph node. Scattered small and borderline sized mediastinal lymph nodes, stable. Trachea and esophagus are unremarkable. Thyroid unremarkable. Lungs/Pleura: Trace right pleural effusion. Masslike consolidation in the right lower lobe is again noted and is stable since prior study. New rounded masslike area of consolidation in the right upper lobe measuring 2.5 cm. Increasing airspace disease posteriorly and peripherally in the right upper lobe and superior segment of the right lower lobe. Increasing ground-glass airspace opacity posteriorly in the superior segment of the left lower lobe. Paraseptal emphysema. Peripheral reticulation in the lungs bilaterally compatible with fibrosis. None none Upper Abdomen: Imaging into the upper abdomen demonstrates no acute findings. Musculoskeletal: Chest wall soft tissues are unremarkable. No acute bony abnormality. Review of the MIP images confirms the above findings. IMPRESSION: Rounded area of masslike consolidation in the right lower lobe with cavitation again noted, unchanged. New airspace disease seen in the right upper lobe and right lower lobe as well as superior segment of the left lower lobe. These areas are concerning for pneumonia. Areas of peripheral interstitial prominence and fibrosis bilaterally. Right hilar and borderline mediastinal lymph nodes are stable. Coronary artery disease. Aortic Atherosclerosis (ICD10-I70.0). Electronically Signed   By: Rolm Baptise M.D.   On: 02/07/2020 20:24   DG Chest Port 1 View  Result Date: 02/15/2020 CLINICAL DATA:  Cough, worsening dyspnea. EXAM: PORTABLE CHEST 1 VIEW COMPARISON:  02/07/2020 FINDINGS: Improved aeration at the right lung base compared to the prior  examination. Interstitial thickening and chronic changes at the left lung base are unchanged. Heart and mediastinum are grossly stable. IMPRESSION: 1. Improved aeration at the right lung base compared to the recent comparison examination. 2. Chronic changes. Electronically Signed   By: Markus Daft M.D.   On: 02/15/2020 13:16   DG Chest Portable 1 View  Result Date: 02/07/2020 CLINICAL DATA:  Shortness of breath, cough, congestion EXAM: PORTABLE CHEST 1 VIEW COMPARISON:  02/03/2020 FINDINGS: Airspace disease in the right mid and lower lung again noted, most confluent at the right lung base compatible with pneumonia. Heart is normal size. Left lung clear. Previously seen patchy opacities at the left base have resolved. No effusions or acute bony abnormality. IMPRESSION: Continued patchy right mid and lower lung airspace opacities concerning for pneumonia. Clearing of the left basilar opacities since prior study. Electronically Signed   By: Rolm Baptise M.D.   On: 02/07/2020 18:51   VAS [place] LOWER EXTREMITY VENOUS (DVT)  Result Date: 02/10/2020  Lower Venous DVT Study Indications: Elevated Ddimer.  Risk Factors: COVID 19 positive. Comparison Study: No prior studies. Performing Technologist: Oliver Hum RVT  Examination Guidelines: A complete evaluation includes B-mode imaging, spectral Doppler, color Doppler, and power Doppler as needed of all accessible portions of each vessel. Bilateral testing is considered an integral part of a complete examination. Limited examinations for reoccurring indications may be performed as noted. The reflux portion of the exam is performed with the patient in reverse Trendelenburg.  +---------+---------------+---------+-----------+----------+--------------+ RIGHT    CompressibilityPhasicitySpontaneityPropertiesThrombus Aging +---------+---------------+---------+-----------+----------+--------------+ CFV      Full           Yes      Yes                                  +---------+---------------+---------+-----------+----------+--------------+ SFJ      Full                                                        +---------+---------------+---------+-----------+----------+--------------+  FV Prox  Full                                                        +---------+---------------+---------+-----------+----------+--------------+ FV Mid   Full                                                        +---------+---------------+---------+-----------+----------+--------------+ FV DistalFull                                                        +---------+---------------+---------+-----------+----------+--------------+ PFV      Full                                                        +---------+---------------+---------+-----------+----------+--------------+ POP      Full           Yes      Yes                                 +---------+---------------+---------+-----------+----------+--------------+ PTV      Full                                                        +---------+---------------+---------+-----------+----------+--------------+ PERO     Full                                                        +---------+---------------+---------+-----------+----------+--------------+   +---------+---------------+---------+-----------+----------+--------------+ LEFT     CompressibilityPhasicitySpontaneityPropertiesThrombus Aging +---------+---------------+---------+-----------+----------+--------------+ CFV      Full           Yes      Yes                                 +---------+---------------+---------+-----------+----------+--------------+ SFJ      Full                                                        +---------+---------------+---------+-----------+----------+--------------+ FV Prox  Full                                                         +---------+---------------+---------+-----------+----------+--------------+  FV Mid   Full                                                        +---------+---------------+---------+-----------+----------+--------------+ FV DistalFull                                                        +---------+---------------+---------+-----------+----------+--------------+ PFV      Full                                                        +---------+---------------+---------+-----------+----------+--------------+ POP      Full           Yes      Yes                                 +---------+---------------+---------+-----------+----------+--------------+ PTV      Full                                                        +---------+---------------+---------+-----------+----------+--------------+ PERO     Full                                                        +---------+---------------+---------+-----------+----------+--------------+     Summary: RIGHT: - There is no evidence of deep vein thrombosis in the lower extremity.  - No cystic structure found in the popliteal fossa.  LEFT: - There is no evidence of deep vein thrombosis in the lower extremity.  - No cystic structure found in the popliteal fossa.  *See table(s) above for measurements and observations. Electronically signed by Ruta Hinds MD on 02/10/2020 at 6:25:28 PM.    Final    US Abdomen Limited RUQ (LIVER/GB)  Result Date: 02/15/2020 CLINICAL DATA:  Right upper quadrant abdominal pain. History of prior cholecystectomy. EXAM: ULTRASOUND ABDOMEN LIMITED RIGHT UPPER QUADRANT COMPARISON:  None. FINDINGS: Gallbladder: The gallbladder is surgically absent. Common bile duct: Diameter: 4.9 mm Liver: No focal lesion identified. Within normal limits in parenchymal echogenicity. Portal vein is patent on color Doppler imaging with normal direction of blood flow towards the liver. Other: The study is limited secondary  to overlying bowel gas. IMPRESSION: 1. Findings consistent with prior cholecystectomy. 2. Otherwise, unremarkable right upper quadrant ultrasound. Electronically Signed   By: Virgina Norfolk M.D.   On: 02/15/2020 21:02

## 2020-02-22 NOTE — Consult Note (Addendum)
Palliative Medicine   Name: FAY SWIDER Date: 02/22/2020 MRN: [id number]  DOB: Aug 25, 1945  Patient Care Team: Wallie Char, FNP as PCP - General (Family Medicine) Jackelyn Knife, MD as Rounding Team (Internal Medicine)    REASON FOR CONSULTATION: BRANDAN ROBICHEAUX is a 74 y.o. 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  Patient was recently hospitalized 12/14/2019 -12/17/2019 with neutropenic fever.  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  Patient was hospitalized again 02/08/2020-02/10/2020 with COVID-19 pneumonia.  Patient is now readmitted on 02/15/2020 with recurrent pneumonia.  Palliative care was consulted up address goals.  SOCIAL HISTORY:     reports that he has quit smoking. He smoked 1.50 packs per day. He has never used smokeless tobacco. He reports previous drug use. Drugs: Marijuana and LSD. He reports that he does not drink alcohol.  Patient is married and lives at home with his wife.  He has a son and daughter who are involved.  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  He worked as an Engineer, technical sales.  ADVANCE DIRECTIVES:  None on file  CODE STATUS: Limited code  PAST MEDICAL HISTORY: Past Medical History:  Diagnosis Date  . Anxiety    takes medication  . Arthritis   . Bipolar 1 disorder ([place])   . Bronchitis   . Cancer ([place])   . Carotid stenosis, left   . GERD (gastroesophageal reflux disease)   . H/O hiatal hernia   . Hypertension   . Shortness of breath   . Sleep apnea    2 YEARS  [place]   . Stroke [facility])     PAST SURGICAL HISTORY:  Past Surgical History:  Procedure Laterality Date  . BIOPSY  12/25/2019   Procedure: BIOPSY;  Surgeon: Rush Landmark Telford Nab., MD;  Location: [place];   Service: Gastroenterology;;  . CHOLECYSTECTOMY    . ENDARTERECTOMY Left 05/03/2017   Procedure: ENDARTERECTOMY CAROTID LEFT;  Surgeon: Rosetta Posner, MD;  Location: [facility] OR;  Service: Vascular;  Laterality: Left;  . ESOPHAGOGASTRODUODENOSCOPY (EGD) WITH PROPOFOL N/A 12/25/2019   Procedure: ESOPHAGOGASTRODUODENOSCOPY (EGD) WITH PROPOFOL;  Surgeon: Irving Copas., MD;  Location: [place];  Service: Gastroenterology;  Laterality: N/A;  . FRACTURE SURGERY     right ankle  . head injury    . KNEE SURGERY     x2  . ORIF SHOULDER FRACTURE  03/03/2011   Procedure: OPEN REDUCTION INTERNAL FIXATION (ORIF) SHOULDER FRACTURE;  Surgeon: Augustin Schooling;  Location: [place];  Service: Orthopedics;  Laterality: Left;  LEFT PROXIMAL HUMERUS Open Reduction internal fixation  . SHOULDER ARTHROSCOPY WITH SUBACROMIAL DECOMPRESSION AND BICEP TENDON REPAIR  02/24/2012   Procedure: SHOULDER ARTHROSCOPY WITH SUBACROMIAL DECOMPRESSION AND BICEP TENDON REPAIR;  Surgeon: Augustin Schooling, MD;  Location: [place];  Service: Orthopedics;  Laterality: Left;  with capsular release and lysis of adhesions  . TONSILLECTOMY      HEMATOLOGY/ONCOLOGY HISTORY:  Oncology History   No history exists.    ALLERGIES:  is allergic to statins.  MEDICATIONS:  Current Facility-Administered Medications  Medication Dose Route Frequency Provider Last Rate Last Admin  . acetaminophen (TYLENOL) tablet 650 mg  650 mg Oral Q6H PRN Toy Baker, MD   650 mg at 02/19/20 1153  . albuterol (VENTOLIN HFA) 108 (90 Base)  MCG/ACT inhaler 1-2 puff  1-2 puff Inhalation Q6H PRN Doutova, Anastassia, MD      . alum & mag hydroxide-simeth (MAALOX/MYLANTA) 200-200-20 MG/5ML suspension 30 mL  30 mL Oral Q4H PRN Jonetta Osgood, MD   30 mL at 02/16/20 2319  . ascorbic acid (VITAMIN C) tablet 500 mg  500 mg Oral Daily Doutova, Anastassia, MD   500 mg at 02/22/20 0914  . baricitinib (OLUMIANT) tablet 4 mg  4 mg Oral Daily Jonetta Osgood, MD    4 mg at 02/22/20 0913  . benzonatate (TESSALON) capsule 200 mg  200 mg Oral TID Jonetta Osgood, MD   200 mg at 02/22/20 0909  . chlorpheniramine-HYDROcodone (TUSSIONEX) 10-8 MG/5ML suspension 5 mL  5 mL Oral Q12H PRN Jonetta Osgood, MD      . clopidogrel (PLAVIX) tablet 75 mg  75 mg Oral QHS Toy Baker, MD   75 mg at 02/21/20 2118  . enoxaparin (LOVENOX) injection 40 mg  40 mg Subcutaneous Q24H Doutova, Anastassia, MD   40 mg at 02/22/20 0143  . feeding supplement (ENSURE ENLIVE / ENSURE PLUS) liquid 237 mL  237 mL Oral TID BM Jonetta Osgood, MD   237 mL at 02/22/20 0915  . fluticasone (FLONASE) 50 MCG/ACT nasal spray 2 spray  2 spray Each Nare Daily Jonetta Osgood, MD   2 spray at 02/22/20 0914  . fluticasone furoate-vilanterol (BREO ELLIPTA) 100-25 MCG/INH 1 puff  1 puff Inhalation Daily Jonetta Osgood, MD   1 puff at 02/22/20 0859  . gemfibrozil (LOPID) tablet 600 mg  600 mg Oral BID AC Doutova, Anastassia, MD   600 mg at 02/22/20 0859  . guaiFENesin (MUCINEX) 12 hr tablet 600 mg  600 mg Oral BID Toy Baker, MD   600 mg at 02/22/20 0909  . HYDROcodone-acetaminophen (NORCO/VICODIN) 5-325 MG per tablet 1 tablet  1 tablet Oral Q6H PRN Thurnell Lose, MD      . lactated ringers infusion   Intravenous Continuous Thurnell Lose, MD      . MEDLINE mouth rinse  15 mL Mouth Rinse BID Jonetta Osgood, MD   15 mL at 02/21/20 2120  . methylPREDNISolone sodium succinate (SOLU-MEDROL) 40 mg/mL injection 40 mg  40 mg Intravenous Daily Thurnell Lose, MD   40 mg at 02/22/20 0914  . morphine 2 MG/ML injection 1 mg  1 mg Intravenous Q4H PRN Toy Baker, MD   1 mg at 02/15/20 2258  . ondansetron (ZOFRAN) injection 4 mg  4 mg Intravenous Q6H PRN Doutova, Anastassia, MD      . oxymetazoline (AFRIN) 0.05 % nasal spray 1 spray  1 spray Each Nare BID Thurnell Lose, MD   1 spray at 02/22/20 0915  . pantoprazole (PROTONIX) EC tablet 40 mg  40 mg Oral Daily  Toy Baker, MD   40 mg at 02/22/20 0914  . rosuvastatin (CRESTOR) tablet 40 mg  40 mg Oral Daily Toy Baker, MD   40 mg at 02/22/20 0909  . sodium chloride (OCEAN) 0.65 % nasal spray 1 spray  1 spray Each Nare PRN Jonetta Osgood, MD   1 spray at 02/16/20 1030  . sodium chloride flush (NS) 0.9 % injection 3 mL  3 mL Intravenous Q12H Toy Baker, MD   3 mL at 02/22/20 0916  . thiothixene (NAVANE) capsule 15 mg  15 mg Oral QHS Toy Baker, MD   15 mg at 02/21/20 2118  . zinc sulfate  capsule 220 mg  220 mg Oral Daily Doutova, Nyoka Lint, MD   220 mg at 02/22/20 0913    VITAL SIGNS: BP 91/71 (BP Location: Left Arm)   Pulse 85   Temp 98 F (36.7 C) (Axillary)   Resp 15   Ht 5\' 8"  (1.727 m)   Wt 147 lb 11.3 oz (67 kg)   SpO2 94%   BMI 22.46 kg/m  Filed Weights   02/16/20 2031  Weight: 147 lb 11.3 oz (67 kg)    Estimated body mass index is 22.46 kg/m as calculated from the following:   Height as of this encounter: 5\' 8"  (1.727 m).   Weight as of this encounter: 147 lb 11.3 oz (67 kg).  LABS: CBC:    Component Value Date/Time   WBC 11.3 (H) 02/22/2020 0401   HGB 14.1 02/22/2020 0401   HCT 41.9 02/22/2020 0401   PLT 245 02/22/2020 0401   MCV 98.6 02/22/2020 0401   NEUTROABS 10.9 (H) 02/20/2020 0304   LYMPHSABS 0.2 (L) 02/20/2020 0304   MONOABS 0.4 02/20/2020 0304   EOSABS 0.0 02/20/2020 0304   BASOSABS 0.0 02/20/2020 0304   Comprehensive Metabolic Panel:    Component Value Date/Time   NA 140 02/22/2020 0401   K 4.1 02/22/2020 0401   CL 97 (L) 02/22/2020 0401   CO2 29 02/22/2020 0401   BUN 50 (H) 02/22/2020 0401   CREATININE 1.08 02/22/2020 0401   GLUCOSE 161 (H) 02/22/2020 0401   CALCIUM 9.0 02/22/2020 0401   AST 22 02/22/2020 0401   ALT 22 02/22/2020 0401   ALKPHOS 72 02/22/2020 0401   BILITOT 1.1 02/22/2020 0401   PROT 6.8 02/22/2020 0401   ALBUMIN 2.7 (L) 02/22/2020 0401    RADIOGRAPHIC STUDIES: DG Chest 2 View  Result  Date: 02/03/2020 CLINICAL DATA:  Hypotension, shortness of breath EXAM: CHEST - 2 VIEW COMPARISON:  12/20/2019 FINDINGS: Patchy airspace disease in the lower lobes concerning for pneumonia. Heart is normal size. No effusions or pneumothorax. No acute bony abnormality. IMPRESSION: Patchy bilateral lower lobe airspace opacities concerning for pneumonia. Electronically Signed   By: Rolm Baptise M.D.   On: 02/03/2020 17:42   CT Angio Chest PE W and/or Wo Contrast  Result Date: 02/15/2020 CLINICAL DATA:  Worsening shortness of breath. EXAM: CT ANGIOGRAPHY CHEST WITH CONTRAST TECHNIQUE: Multidetector CT imaging of the chest was performed using the standard protocol during bolus administration of intravenous contrast. Multiplanar CT image reconstructions and MIPs were obtained to evaluate the vascular anatomy. CONTRAST:  31mL OMNIPAQUE IOHEXOL 350 MG/ML SOLN COMPARISON:  CT dated 02/07/2020 FINDINGS: Cardiovascular: Contrast injection is sufficient to demonstrate satisfactory opacification of the pulmonary arteries to the segmental level. There is no pulmonary embolus or evidence of right heart strain. The size of the main pulmonary artery is normal. Normal heart size with coronary artery calcification. The course and caliber of the aorta are normal. There is mild atherosclerotic calcification. Opacification decreased due to pulmonary arterial phase contrast bolus timing. Mediastinum/Nodes: -- No mediastinal lymphadenopathy. -- No hilar lymphadenopathy. -- No axillary lymphadenopathy. -- No supraclavicular lymphadenopathy. -- Normal thyroid gland where visualized. -  Unremarkable esophagus. Lungs/Pleura: Again noted is a cavitary mass in the right lower lobe similar to prior study. There are persistent ground-glass airspace opacities in the right upper lobe which are essentially stable from prior study. There are worsening peripheral ground-glass airspace opacities in the left lower and left upper lobe. There is no  pneumothorax. No large pleural effusion. Upper Abdomen: Contrast  bolus timing is not optimized for evaluation of the abdominal organs. The visualized portions of the organs of the upper abdomen are normal. Musculoskeletal: There are subacute right-sided rib fractures involving the seventh through ninth ribs. Review of the MIP images confirms the above findings. IMPRESSION: 1. No evidence for acute pulmonary embolus. 2. Worsening peripheral ground-glass airspace opacities in the left lower and left upper lobe, concerning for pneumonia (viral or bacterial) persistent cavitary mass in the right lower lobe. 3. Subacute right-sided rib fractures involving the seventh through ninth ribs. Aortic Atherosclerosis (ICD10-I70.0). Electronically Signed   By: Constance Holster M.D.   On: 02/15/2020 17:36   CT Angio Chest PE W and/or Wo Contrast  Result Date: 02/07/2020 CLINICAL DATA:  Positive D-dimer, shortness of breath, tachycardia. EXAM: CT ANGIOGRAPHY CHEST WITH CONTRAST TECHNIQUE: Multidetector CT imaging of the chest was performed using the standard protocol during bolus administration of intravenous contrast. Multiplanar CT image reconstructions and MIPs were obtained to evaluate the vascular anatomy. CONTRAST:  147mL OMNIPAQUE IOHEXOL 350 MG/ML SOLN COMPARISON:  12/20/2019 FINDINGS: Cardiovascular: No filling defects in the pulmonary arteries to suggest pulmonary emboli. Heart is normal size. Aortic and coronary artery calcifications. No aneurysm. Mediastinum/Nodes: Stable borderline sized right hilar lymph node. Scattered small and borderline sized mediastinal lymph nodes, stable. Trachea and esophagus are unremarkable. Thyroid unremarkable. Lungs/Pleura: Trace right pleural effusion. Masslike consolidation in the right lower lobe is again noted and is stable since prior study. New rounded masslike area of consolidation in the right upper lobe measuring 2.5 cm. Increasing airspace disease posteriorly and  peripherally in the right upper lobe and superior segment of the right lower lobe. Increasing ground-glass airspace opacity posteriorly in the superior segment of the left lower lobe. Paraseptal emphysema. Peripheral reticulation in the lungs bilaterally compatible with fibrosis. None none Upper Abdomen: Imaging into the upper abdomen demonstrates no acute findings. Musculoskeletal: Chest wall soft tissues are unremarkable. No acute bony abnormality. Review of the MIP images confirms the above findings. IMPRESSION: Rounded area of masslike consolidation in the right lower lobe with cavitation again noted, unchanged. New airspace disease seen in the right upper lobe and right lower lobe as well as superior segment of the left lower lobe. These areas are concerning for pneumonia. Areas of peripheral interstitial prominence and fibrosis bilaterally. Right hilar and borderline mediastinal lymph nodes are stable. Coronary artery disease. Aortic Atherosclerosis (ICD10-I70.0). Electronically Signed   By: Rolm Baptise M.D.   On: 02/07/2020 20:24   DG Chest Port 1 View  Result Date: 02/15/2020 CLINICAL DATA:  Cough, worsening dyspnea. EXAM: PORTABLE CHEST 1 VIEW COMPARISON:  02/07/2020 FINDINGS: Improved aeration at the right lung base compared to the prior examination. Interstitial thickening and chronic changes at the left lung base are unchanged. Heart and mediastinum are grossly stable. IMPRESSION: 1. Improved aeration at the right lung base compared to the recent comparison examination. 2. Chronic changes. Electronically Signed   By: Markus Daft M.D.   On: 02/15/2020 13:16   DG Chest Portable 1 View  Result Date: 02/07/2020 CLINICAL DATA:  Shortness of breath, cough, congestion EXAM: PORTABLE CHEST 1 VIEW COMPARISON:  02/03/2020 FINDINGS: Airspace disease in the right mid and lower lung again noted, most confluent at the right lung base compatible with pneumonia. Heart is normal size. Left lung clear.  Previously seen patchy opacities at the left base have resolved. No effusions or acute bony abnormality. IMPRESSION: Continued patchy right mid and lower lung airspace opacities concerning for pneumonia. Clearing of  the left basilar opacities since prior study. Electronically Signed   By: Rolm Baptise M.D.   On: 02/07/2020 18:51   VAS [place] LOWER EXTREMITY VENOUS (DVT)  Result Date: 02/10/2020  Lower Venous DVT Study Indications: Elevated Ddimer.  Risk Factors: COVID 19 positive. Comparison Study: No prior studies. Performing Technologist: Oliver Hum RVT  Examination Guidelines: A complete evaluation includes B-mode imaging, spectral Doppler, color Doppler, and power Doppler as needed of all accessible portions of each vessel. Bilateral testing is considered an integral part of a complete examination. Limited examinations for reoccurring indications may be performed as noted. The reflux portion of the exam is performed with the patient in reverse Trendelenburg.  +---------+---------------+---------+-----------+----------+--------------+ RIGHT    CompressibilityPhasicitySpontaneityPropertiesThrombus Aging +---------+---------------+---------+-----------+----------+--------------+ CFV      Full           Yes      Yes                                 +---------+---------------+---------+-----------+----------+--------------+ SFJ      Full                                                        +---------+---------------+---------+-----------+----------+--------------+ FV Prox  Full                                                        +---------+---------------+---------+-----------+----------+--------------+ FV Mid   Full                                                        +---------+---------------+---------+-----------+----------+--------------+ FV DistalFull                                                         +---------+---------------+---------+-----------+----------+--------------+ PFV      Full                                                        +---------+---------------+---------+-----------+----------+--------------+ POP      Full           Yes      Yes                                 +---------+---------------+---------+-----------+----------+--------------+ PTV      Full                                                        +---------+---------------+---------+-----------+----------+--------------+  PERO     Full                                                        +---------+---------------+---------+-----------+----------+--------------+   +---------+---------------+---------+-----------+----------+--------------+ LEFT     CompressibilityPhasicitySpontaneityPropertiesThrombus Aging +---------+---------------+---------+-----------+----------+--------------+ CFV      Full           Yes      Yes                                 +---------+---------------+---------+-----------+----------+--------------+ SFJ      Full                                                        +---------+---------------+---------+-----------+----------+--------------+ FV Prox  Full                                                        +---------+---------------+---------+-----------+----------+--------------+ FV Mid   Full                                                        +---------+---------------+---------+-----------+----------+--------------+ FV DistalFull                                                        +---------+---------------+---------+-----------+----------+--------------+ PFV      Full                                                        +---------+---------------+---------+-----------+----------+--------------+ POP      Full           Yes      Yes                                  +---------+---------------+---------+-----------+----------+--------------+ PTV      Full                                                        +---------+---------------+---------+-----------+----------+--------------+ PERO     Full                                                        +---------+---------------+---------+-----------+----------+--------------+  Summary: RIGHT: - There is no evidence of deep vein thrombosis in the lower extremity.  - No cystic structure found in the popliteal fossa.  LEFT: - There is no evidence of deep vein thrombosis in the lower extremity.  - No cystic structure found in the popliteal fossa.  *See table(s) above for measurements and observations. Electronically signed by Ruta Hinds MD on 02/10/2020 at 6:25:28 PM.    Final    US Abdomen Limited RUQ (LIVER/GB)  Result Date: 02/15/2020 CLINICAL DATA:  Right upper quadrant abdominal pain. History of prior cholecystectomy. EXAM: ULTRASOUND ABDOMEN LIMITED RIGHT UPPER QUADRANT COMPARISON:  None. FINDINGS: Gallbladder: The gallbladder is surgically absent. Common bile duct: Diameter: 4.9 mm Liver: No focal lesion identified. Within normal limits in parenchymal echogenicity. Portal vein is patent on color Doppler imaging with normal direction of blood flow towards the liver. Other: The study is limited secondary to overlying bowel gas. IMPRESSION: 1. Findings consistent with prior cholecystectomy. 2. Otherwise, unremarkable right upper quadrant ultrasound. Electronically Signed   By: Virgina Norfolk M.D.   On: 02/15/2020 21:02    PERFORMANCE STATUS (ECOG) : 2 - Symptomatic, <50% confined to bed  Review of Systems Unless otherwise noted, a complete review of systems is negative.  Physical Exam General: NAD, frail appearing Pulmonary: Unlabored Extremities: no edema, no joint deformities Skin: no rashes Neurological: Weakness but otherwise nonfocal  IMPRESSION: Patient reports feeling fatigued  but denies any distressing symptoms at present.  He says prior to this hospitalization, he has been declining over the past several months.  He lives at home with his wife.  Attempted to address goals but patient was mostly deferential to his wife and asked that I call her.  I called and spoke with patient's wife.  She confirmed that patient has been declining over the past several months.  Each hospitalization has resulted in patient being weaker.  She says that patient and family have discussed goals and that they are inclined to focus more on supportive care with the expectation that he will decline over time.  She says the patient is not interested in pursuing any more treatment for his cancer.  Initially, wife had requested palliative care services but upon further discussion she is really more interested in hospice.  We discussed the role of hospice in providing supportive and comfort care at home.  I note that [facility] has already seen patient.  We will speak with them to coordinate hospice services at home upon discharge from the hospital.  PLAN: -Continue current scope of treatment -Plan for hospice care at home at time of discharge -Will coordinate with [facility]   Time Total: 60 minutes  Visit consisted of counseling and education dealing with the complex and emotionally intense issues of symptom management and palliative care in the setting of serious and potentially life-threatening illness.Greater than 50%  of this time was spent counseling and coordinating care related to the above assessment and plan.  Signed by: Altha Harm, PhD, NP-C

## 2020-02-22 NOTE — Progress Notes (Addendum)
[facility])  Referral received from PMT for hospice once pt discharges home.  [facility] will follow up with his wife to set up services.  Thank you, Venia Carbon RN, BSN, [facility] Liaison    **spoke with wife [name], she confirms that she would like hospice services for her husband once he d/c's home.    [name] states they have the following DME in the home: [facility], shower, walker, O2 from the [place]  May need WC at d/c.  [facility] will order this if it is needed.

## 2020-02-23 LAB — COMPREHENSIVE METABOLIC PANEL
ALT: 21 U/L (ref 0–44)
AST: 23 U/L (ref 15–41)
Albumin: 2.5 g/dL — ABNORMAL LOW (ref 3.5–5.0)
Alkaline Phosphatase: 63 U/L (ref 38–126)
Anion gap: 10 (ref 5–15)
BUN: 48 mg/dL — ABNORMAL HIGH (ref 8–23)
CO2: 32 mmol/L (ref 22–32)
Calcium: 8.6 mg/dL — ABNORMAL LOW (ref 8.9–10.3)
Chloride: 97 mmol/L — ABNORMAL LOW (ref 98–111)
Creatinine, Ser: 1.16 mg/dL (ref 0.61–1.24)
GFR, Estimated: >60 mL/min (ref >60)
Glucose, Bld: 120 mg/dL — ABNORMAL HIGH (ref 70–99)
Potassium: 3.8 mmol/L (ref 3.5–5.1)
Sodium: 139 mmol/L (ref 135–145)
Total Bilirubin: 1.2 mg/dL (ref 0.3–1.2)
Total Protein: 6.4 g/dL — ABNORMAL LOW (ref 6.5–8.1)

## 2020-02-23 LAB — CBC
HCT: 39.1 % (ref 39.0–52.0)
Hemoglobin: 13.1 g/dL (ref 13.0–17.0)
MCH: 33.4 pg (ref 26.0–34.0)
MCHC: 33.5 g/dL (ref 30.0–36.0)
MCV: 99.7 fL (ref 80.0–100.0)
Platelets: 227 10*3/uL (ref 150–400)
RBC: 3.92 MIL/uL — ABNORMAL LOW (ref 4.22–5.81)
RDW: 13.6 % (ref 11.5–15.5)
WBC: 10.5 10*3/uL (ref 4.0–10.5)
nRBC: 0 % (ref 0.0–0.2)

## 2020-02-23 LAB — GLUCOSE, CAPILLARY
Glucose-Capillary: 91 mg/dL (ref 70–99)
Glucose-Capillary: 138 mg/dL — ABNORMAL HIGH (ref 70–99)
Glucose-Capillary: 214 mg/dL — ABNORMAL HIGH (ref 70–99)

## 2020-02-23 LAB — D-DIMER, QUANTITATIVE: D-Dimer, Quant: 1.98 ug/mL-FEU — ABNORMAL HIGH (ref 0.00–0.50)

## 2020-02-23 LAB — C-REACTIVE PROTEIN: CRP: 0.6 mg/dL (ref <1.0)

## 2020-02-23 NOTE — Progress Notes (Signed)
PROGRESS NOTE                                                                                                                                                                                                             Patient Demographics:    Melvin Foley, is a 74 y.o. male, DOB - [birth year], ZOX:[id number]  Outpatient Primary MD for the patient is Wallie Char, FNP   Admit date - 02/15/2020   LOS - 8  Chief Complaint  Patient presents with  . Covid Positive  . Pneumonia       Brief Narrative: Patient is a 74 y.o. male with PMHx of stage IIIb adenocarcinoma-finished radiation approximately 6 weeks back (follows at [facility])-recently hospitalized from 10/22-10/25 COVID-19 pneumonia-did not require O2 on discharge-presented back to the ED on 10/30 with shortness of breath-found to have severe hypoxic respiratory failure due to ongoing COVID-19 pneumonitis.  COVID-19 vaccinated status: Unvaccinated   Significant Events: 10/18>> evaluated at [facility] for hypotension/possible pneumonia-refused admission-Covid 19 negative 10/22-10/25>> hospitalization for Covid-discharged on room air. 10/30>> readmitted with severe hypoxemia-due to ongoing COVID-19 pneumonitis.  Significant studies: 10/22>>Chest x-ray: Patchy right mid/lower lung opacities concerning for pneumonia 10/22>> CTA chest: New airspace disease in the right upper/right lower lobe/left lower lobe-rounded area of masslike consolidation in the right lower lobe with cavitation again noted. 10/31>> CTA chest: No PE, worsening peripheral groundglass opacities, subacute right sided rib fractures along the seventh through ninth ribs.  COVID-19 medications: Steroids: 10/22>> Remdesivir: 10/22>>10/26 Baricitinib: 10/31>>  Antibiotics: Vancomycin: 10/30>> 10/31 Cefepime: 10/30>> 10/31  Microbiology data: 10/22 >>blood culture: No growth 10/30>>  blood culture: No growth  Procedures: None  Consults: None  DVT prophylaxis: enoxaparin (LOVENOX) injection 40 mg Start: 02/15/20 2300    Subjective:   Patient in bed, appears comfortable, denies any headache, no fever, no chest pain or pressure, no shortness of breath , no abdominal pain. No focal weakness.   Assessment  & Plan :   Acute hypoxic respiratory failure secondary to Covid 19 Viral pneumonia: Improving-Down to 2 L of oxygen at rest (at one point was on 12 L of HFNC).  Still with significant amount of exertional dyspnea.  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   Fever: afebrile O2 requirements:  SpO2: 92 % O2 Flow Rate (L/min): 2 L/min   Prone/Incentive Spirometry: encouraged  incentive spirometry use 3-4/hour.   Recent Labs  Lab 02/19/20 0054 02/20/20 0304 02/21/20 0747 02/22/20 0401 02/23/20 0302  WBC 8.9 11.5* 10.3 11.3* 10.5  HGB 11.9* 12.7* 13.2 14.1 13.1  HCT 35.0* 37.3* 39.3 41.9 39.1  PLT 195 197 206 245 227  CRP 1.1* 1.1* 0.7 0.6 0.6  DDIMER 1.54* 1.51* 1.25* 1.86* 1.98*  AST 25 27 22 22 23   ALT 17 18 19 22 21   ALKPHOS 57 62 61 72 63  BILITOT 0.9 0.6 0.8 1.1 1.2  ALBUMIN 2.3* 2.5* 2.5* 2.7* 2.5*      Epistaxis-mild-resolved-treated with Afrin nasal spray.  Continue monitoring.  Elevated D-dimer.  Will check leg ultrasound.    COPD: No evidence of flare-continue bronchodilators  Chronic diastolic heart failure: Euvolemic.  Hypotension.  Resolved after IVF.  History of prior CVA/carotid endarterectomy: Continue Plavix.  Stage IIIb adenocarcinoma of the lung: Claims recently completed RTX at [facility] 6 weeks back-before that he was on chemo at the [facility].  CT chest continues to show right lower lobe  mass.  Follow with oncology post discharge. However per spouse on 11/2-patient has made up his mind not to pursue any further treatment for this cancer.  Spouse is requesting palliative care services on discharge-Case management informed.  Incidental finding of right rib fractures: Supportive care.  Does not have significant pain-apparently did fall off his bed (soft fall per wife) approximately a week back-per spouse-she was told that he is going to have weak ribs due to radiation.  Schizophrenia: Appears stable-continue thiothixene   Debility/deconditioning: Suspect has significant amount of debility/deconditioning at baseline but has more worsening due to acute illness-PT/OT eval completed-home health recommended-have ordered  Palliative care/goals of care discussion: Understands that he is severely weak with extensive parenchymal lung injury due to COVID-19 pneumonia-he reconfirms that he does not want to be intubated in case he worsens-but does want to pursue CPR for arrhythmias etc. He understood that he remained at significant risk for further decompensation from COVID-19 pneumonia-but thankfully with supportive care he has seemed to improve.  DO NOT INTUBATE in place-Per spouse-patient not keen on resuming cancer treatment-and she is requesting involvement of palliative care on discharge.     Condition - Guarded  Family Communication  : Spouse-[phone]-on 02/23/20  Code Status :  DNI  Diet :  Diet Order            DIET DYS 3 Room service appropriate? Yes; Fluid consistency: Thin  Diet effective now                  Disposition Plan  :   Status is: Inpatient  Remains inpatient appropriate because:Inpatient level of care appropriate due to severity of illness  Dispo: The patient is from: Home              Anticipated d/c is to: Home              Anticipated d/c date is: > 3 days              Patient currently is not medically stable to d/c.   Barriers to  discharge: Hypoxia requiring O2 supplementation  Antimicorbials  :    Anti-infectives (From admission, onward)   Start     Dose/Rate Route Frequency Ordered Stop  02/16/20 1000  ceFEPIme (MAXIPIME) 2 g in sodium chloride 0.9 % 100 mL IVPB  Status:  Discontinued        2 g 200 mL/hr over 30 Minutes Intravenous Every 12 hours 02/15/20 1951 02/16/20 1105   02/16/20 0800  vancomycin (VANCOREADY) IVPB 500 mg/100 mL  Status:  Discontinued        500 mg 100 mL/hr over 60 Minutes Intravenous Every 12 hours 02/15/20 1844 02/16/20 1105   02/15/20 1845  ceFEPIme (MAXIPIME) 2 g in sodium chloride 0.9 % 100 mL IVPB        2 g 200 mL/hr over 30 Minutes Intravenous  Once 02/15/20 1839 02/15/20 1949   02/15/20 1845  vancomycin (VANCOREADY) IVPB 1500 mg/300 mL        1,500 mg 150 mL/hr over 120 Minutes Intravenous  Once 02/15/20 1844 02/15/20 2152      Inpatient Medications  Scheduled Meds: . vitamin C  500 mg Oral Daily  . baricitinib  4 mg Oral Daily  . benzonatate  200 mg Oral TID  . clopidogrel  75 mg Oral QHS  . enoxaparin (LOVENOX) injection  40 mg Subcutaneous Q24H  . feeding supplement  237 mL Oral TID BM  . fluticasone  2 spray Each Nare Daily  . fluticasone furoate-vilanterol  1 puff Inhalation Daily  . gemfibrozil  600 mg Oral BID AC  . guaiFENesin  600 mg Oral BID  . mouth rinse  15 mL Mouth Rinse BID  . methylPREDNISolone (SOLU-MEDROL) injection  40 mg Intravenous Daily  . oxymetazoline  1 spray Each Nare BID  . pantoprazole  40 mg Oral Daily  . rosuvastatin  40 mg Oral Daily  . sodium chloride flush  3 mL Intravenous Q12H  . thiothixene  15 mg Oral QHS  . zinc sulfate  220 mg Oral Daily   Continuous Infusions:  PRN Meds:.acetaminophen, albuterol, alum & mag hydroxide-simeth, chlorpheniramine-HYDROcodone, HYDROcodone-acetaminophen, morphine injection, [DISCONTINUED] ondansetron **OR** ondansetron (ZOFRAN) IV, sodium chloride   Time Spent in minutes  25  See all Orders  from today for further details   Lala Lund M.D on 02/23/2020 at 9:41 AM  To page go to [email or web address] - use universal password  [facility] -  Office  [phone]    Objective:   Vitals:   02/22/20 2011 02/22/20 2358 02/23/20 0400 02/23/20 0756  BP: 114/75 104/71 90/68 105/77  Pulse: 74 90 86 79  Resp: 20 20 20 19   Temp: 98.3 F (36.8 C) 98.1 F (36.7 C) 98 F (36.7 C) 98.1 F (36.7 C)  TempSrc: Axillary Axillary Axillary Oral  SpO2: 98% (!) 82% 92% 92%  Weight:      Height:        Wt Readings from Last 3 Encounters:  02/16/20 67 kg  02/08/20 67.1 kg  02/03/20 69.7 kg     Intake/Output Summary (Last 24 hours) at 02/23/2020 0941 Last data filed at 02/22/2020 1854 Gross per 24 hour  Intake 600 ml  Output --  Net 600 ml     Physical Exam  Awake Alert, No new F.N deficits, Normal affect [place].AT,PERRAL Supple Neck,No JVD, No cervical lymphadenopathy appriciated.  Symmetrical Chest wall movement, Good air movement bilaterally, CTAB RRR,No Gallops, Rubs or new Murmurs, No Parasternal Heave +ve B.Sounds, Abd Soft, No tenderness, No organomegaly appriciated, No rebound - guarding or rigidity. No Cyanosis, Clubbing or edema, No new Rash or bruise    Data Review:    CBC Recent Labs  Lab 02/17/20  7341 02/17/20 0424 02/18/20 0141 02/18/20 0141 02/19/20 0054 02/20/20 0304 02/21/20 0747 02/22/20 0401 02/23/20 0302  WBC 5.2   < > 11.4*   < > 8.9 11.5* 10.3 11.3* 10.5  HGB 11.7*   < > 11.7*   < > 11.9* 12.7* 13.2 14.1 13.1  HCT 35.5*   < > 34.7*   < > 35.0* 37.3* 39.3 41.9 39.1  PLT 188   < > 186   < > 195 197 206 245 227  MCV 99.4   < > 97.5   < > 98.9 97.9 98.5 98.6 99.7  MCH 32.8   < > 32.9   < > 33.6 33.3 33.1 33.2 33.4  MCHC 33.0   < > 33.7   < > 34.0 34.0 33.6 33.7 33.5  RDW 13.8   < > 13.6   < > 13.8 13.6 13.6 13.6 13.6  LYMPHSABS 0.3*  --  0.2*  --  0.2* 0.2*  --   --   --   MONOABS 0.2  --  0.4  --  0.3 0.4  --   --   --   EOSABS 0.0  --   0.0  --  0.0 0.0  --   --   --   BASOSABS 0.0  --  0.0  --  0.0 0.0  --   --   --    < > = values in this interval not displayed.    Chemistries  Recent Labs  Lab 02/17/20 0424 02/17/20 0424 02/18/20 0141 02/18/20 0141 02/19/20 0054 02/20/20 0304 02/21/20 0747 02/22/20 0401 02/23/20 0302  NA 136   < > 135   < > 134* 133* 139 140 139  K 4.2   < > 4.3   < > 4.3 4.2 4.2 4.1 3.8  CL 106   < > 103   < > 102 98 101 97* 97*  CO2 21*   < > 23   < > 23 25 26 29  32  GLUCOSE 169*   < > 176*   < > 139* 156* 129* 161* 120*  BUN 22   < > 26*   < > 28* 33* 37* 50* 48*  CREATININE 0.91   < > 0.90   < > 0.82 1.12 0.99 1.08 1.16  CALCIUM 8.3*   < > 8.7*   < > 8.6* 8.6* 8.8* 9.0 8.6*  MG 2.1  --  1.9  --  2.1 2.0  --   --   --   AST 23   < > 29   < > 25 27 22 22 23   ALT 14   < > 16   < > 17 18 19 22 21   ALKPHOS 54   < > 56   < > 57 62 61 72 63  BILITOT 0.8   < > 0.6   < > 0.9 0.6 0.8 1.1 1.2   < > = values in this interval not displayed.   ------------------------------------------------------------------------------------------------------------------ No results for input(s): CHOL, HDL, LDLCALC, TRIG, CHOLHDL, LDLDIRECT in the last 72 hours.  Lab Results  Component Value Date   HGBA1C 5.1 05/01/2017   ------------------------------------------------------------------------------------------------------------------ No results for input(s): TSH, T4TOTAL, T3FREE, THYROIDAB in the last 72 hours.  Invalid input(s): FREET3 ------------------------------------------------------------------------------------------------------------------ No results for input(s): VITAMINB12, FOLATE, FERRITIN, TIBC, IRON, RETICCTPCT in the last 72 hours.  Coagulation profile No results for input(s): INR, PROTIME in the last 168 hours.  Recent Labs    02/22/20 0401  02/23/20 0302  DDIMER 1.86* 1.98*    Cardiac Enzymes No results for input(s): CKMB, TROPONINI, MYOGLOBIN in the last 168 hours.  Invalid  input(s): CK ------------------------------------------------------------------------------------------------------------------    Component Value Date/Time   BNP 118.2 (H) 02/15/2020 1243    Micro Results Recent Results (from the past 240 hour(s))  Blood Culture (routine x 2)     Status: None   Collection Time: 02/15/20 12:45 PM   Specimen: BLOOD LEFT FOREARM  Result Value Ref Range Status   Specimen Description BLOOD LEFT FOREARM  Final   Special Requests   Final    BOTTLES DRAWN AEROBIC AND ANAEROBIC Blood Culture results may not be optimal due to an inadequate volume of blood received in culture bottles   Culture   Final    NO GROWTH 5 DAYS Performed at [facility], [place] [address] [place], [place] [address]    Report Status 02/20/2020 FINAL  Final  Blood Culture (routine x 2)     Status: None   Collection Time: 02/15/20  1:16 PM   Specimen: BLOOD  Result Value Ref Range Status   Specimen Description BLOOD RIGHT ANTECUBITAL  Final   Special Requests   Final    BOTTLES DRAWN AEROBIC AND ANAEROBIC Blood Culture results may not be optimal due to an inadequate volume of blood received in culture bottles   Culture   Final    NO GROWTH 5 DAYS Performed at [facility], [place] [address], [place], [place] [address]    Report Status 02/20/2020 FINAL  Final  MRSA PCR Screening     Status: None   Collection Time: 02/15/20  8:12 PM   Specimen: Nasal Mucosa; Nasopharyngeal  Result Value Ref Range Status   MRSA by PCR NEGATIVE NEGATIVE Final    Comment:        The GeneXpert MRSA Assay (FDA approved for NASAL specimens only), is one component of a comprehensive MRSA colonization surveillance program. It is not intended to diagnose MRSA infection nor to guide or monitor treatment for MRSA infections. Performed at [facility], [place] [address], [place], [place] [address]     Radiology Reports DG Chest 2 View  Result Date: 02/03/2020 CLINICAL DATA:   Hypotension, shortness of breath EXAM: CHEST - 2 VIEW COMPARISON:  12/20/2019 FINDINGS: Patchy airspace disease in the lower lobes concerning for pneumonia. Heart is normal size. No effusions or pneumothorax. No acute bony abnormality. IMPRESSION: Patchy bilateral lower lobe airspace opacities concerning for pneumonia. Electronically Signed   By: Rolm Baptise M.D.   On: 02/03/2020 17:42   CT Angio Chest PE W and/or Wo Contrast  Result Date: 02/15/2020 CLINICAL DATA:  Worsening shortness of breath. EXAM: CT ANGIOGRAPHY CHEST WITH CONTRAST TECHNIQUE: Multidetector CT imaging of the chest was performed using the standard protocol during bolus administration of intravenous contrast. Multiplanar CT image reconstructions and MIPs were obtained to evaluate the vascular anatomy. CONTRAST:  42mL OMNIPAQUE IOHEXOL 350 MG/ML SOLN COMPARISON:  CT dated 02/07/2020 FINDINGS: Cardiovascular: Contrast injection is sufficient to demonstrate satisfactory opacification of the pulmonary arteries to the segmental level. There is no pulmonary embolus or evidence of right heart strain. The size of the main pulmonary artery is normal. Normal heart size with coronary artery calcification. The course and caliber of the aorta are normal. There is mild atherosclerotic calcification. Opacification decreased due to pulmonary arterial phase contrast bolus timing. Mediastinum/Nodes: -- No mediastinal lymphadenopathy. -- No hilar lymphadenopathy. -- No axillary lymphadenopathy. --  No supraclavicular lymphadenopathy. -- Normal thyroid gland where visualized. -  Unremarkable esophagus. Lungs/Pleura: Again noted is a cavitary mass in the right lower lobe similar to prior study. There are persistent ground-glass airspace opacities in the right upper lobe which are essentially stable from prior study. There are worsening peripheral ground-glass airspace opacities in the left lower and left upper lobe. There is no pneumothorax. No large pleural  effusion. Upper Abdomen: Contrast bolus timing is not optimized for evaluation of the abdominal organs. The visualized portions of the organs of the upper abdomen are normal. Musculoskeletal: There are subacute right-sided rib fractures involving the seventh through ninth ribs. Review of the MIP images confirms the above findings. IMPRESSION: 1. No evidence for acute pulmonary embolus. 2. Worsening peripheral ground-glass airspace opacities in the left lower and left upper lobe, concerning for pneumonia (viral or bacterial) persistent cavitary mass in the right lower lobe. 3. Subacute right-sided rib fractures involving the seventh through ninth ribs. Aortic Atherosclerosis (ICD10-I70.0). Electronically Signed   By: Constance Holster M.D.   On: 02/15/2020 17:36   CT Angio Chest PE W and/or Wo Contrast  Result Date: 02/07/2020 CLINICAL DATA:  Positive D-dimer, shortness of breath, tachycardia. EXAM: CT ANGIOGRAPHY CHEST WITH CONTRAST TECHNIQUE: Multidetector CT imaging of the chest was performed using the standard protocol during bolus administration of intravenous contrast. Multiplanar CT image reconstructions and MIPs were obtained to evaluate the vascular anatomy. CONTRAST:  164mL OMNIPAQUE IOHEXOL 350 MG/ML SOLN COMPARISON:  12/20/2019 FINDINGS: Cardiovascular: No filling defects in the pulmonary arteries to suggest pulmonary emboli. Heart is normal size. Aortic and coronary artery calcifications. No aneurysm. Mediastinum/Nodes: Stable borderline sized right hilar lymph node. Scattered small and borderline sized mediastinal lymph nodes, stable. Trachea and esophagus are unremarkable. Thyroid unremarkable. Lungs/Pleura: Trace right pleural effusion. Masslike consolidation in the right lower lobe is again noted and is stable since prior study. New rounded masslike area of consolidation in the right upper lobe measuring 2.5 cm. Increasing airspace disease posteriorly and peripherally in the right upper lobe  and superior segment of the right lower lobe. Increasing ground-glass airspace opacity posteriorly in the superior segment of the left lower lobe. Paraseptal emphysema. Peripheral reticulation in the lungs bilaterally compatible with fibrosis. None none Upper Abdomen: Imaging into the upper abdomen demonstrates no acute findings. Musculoskeletal: Chest wall soft tissues are unremarkable. No acute bony abnormality. Review of the MIP images confirms the above findings. IMPRESSION: Rounded area of masslike consolidation in the right lower lobe with cavitation again noted, unchanged. New airspace disease seen in the right upper lobe and right lower lobe as well as superior segment of the left lower lobe. These areas are concerning for pneumonia. Areas of peripheral interstitial prominence and fibrosis bilaterally. Right hilar and borderline mediastinal lymph nodes are stable. Coronary artery disease. Aortic Atherosclerosis (ICD10-I70.0). Electronically Signed   By: Rolm Baptise M.D.   On: 02/07/2020 20:24   DG Chest Port 1 View  Result Date: 02/15/2020 CLINICAL DATA:  Cough, worsening dyspnea. EXAM: PORTABLE CHEST 1 VIEW COMPARISON:  02/07/2020 FINDINGS: Improved aeration at the right lung base compared to the prior examination. Interstitial thickening and chronic changes at the left lung base are unchanged. Heart and mediastinum are grossly stable. IMPRESSION: 1. Improved aeration at the right lung base compared to the recent comparison examination. 2. Chronic changes. Electronically Signed   By: Markus Daft M.D.   On: 02/15/2020 13:16   DG Chest Portable 1 View  Result Date: 02/07/2020 CLINICAL DATA:  Shortness of breath, cough, congestion EXAM: PORTABLE CHEST 1 VIEW COMPARISON:  02/03/2020 FINDINGS: Airspace disease in the right mid and lower lung again noted, most confluent at the right lung base compatible with pneumonia. Heart is normal size. Left lung clear. Previously seen patchy opacities at the left  base have resolved. No effusions or acute bony abnormality. IMPRESSION: Continued patchy right mid and lower lung airspace opacities concerning for pneumonia. Clearing of the left basilar opacities since prior study. Electronically Signed   By: Rolm Baptise M.D.   On: 02/07/2020 18:51   VAS [place] LOWER EXTREMITY VENOUS (DVT)  Result Date: 02/10/2020  Lower Venous DVT Study Indications: Elevated Ddimer.  Risk Factors: COVID 19 positive. Comparison Study: No prior studies. Performing Technologist: Oliver Hum RVT  Examination Guidelines: A complete evaluation includes B-mode imaging, spectral Doppler, color Doppler, and power Doppler as needed of all accessible portions of each vessel. Bilateral testing is considered an integral part of a complete examination. Limited examinations for reoccurring indications may be performed as noted. The reflux portion of the exam is performed with the patient in reverse Trendelenburg.  +---------+---------------+---------+-----------+----------+--------------+ RIGHT    CompressibilityPhasicitySpontaneityPropertiesThrombus Aging +---------+---------------+---------+-----------+----------+--------------+ CFV      Full           Yes      Yes                                 +---------+---------------+---------+-----------+----------+--------------+ SFJ      Full                                                        +---------+---------------+---------+-----------+----------+--------------+ FV Prox  Full                                                        +---------+---------------+---------+-----------+----------+--------------+ FV Mid   Full                                                        +---------+---------------+---------+-----------+----------+--------------+ FV DistalFull                                                        +---------+---------------+---------+-----------+----------+--------------+ PFV      Full                                                         +---------+---------------+---------+-----------+----------+--------------+ POP      Full           Yes      Yes                                 +---------+---------------+---------+-----------+----------+--------------+  PTV      Full                                                        +---------+---------------+---------+-----------+----------+--------------+ PERO     Full                                                        +---------+---------------+---------+-----------+----------+--------------+   +---------+---------------+---------+-----------+----------+--------------+ LEFT     CompressibilityPhasicitySpontaneityPropertiesThrombus Aging +---------+---------------+---------+-----------+----------+--------------+ CFV      Full           Yes      Yes                                 +---------+---------------+---------+-----------+----------+--------------+ SFJ      Full                                                        +---------+---------------+---------+-----------+----------+--------------+ FV Prox  Full                                                        +---------+---------------+---------+-----------+----------+--------------+ FV Mid   Full                                                        +---------+---------------+---------+-----------+----------+--------------+ FV DistalFull                                                        +---------+---------------+---------+-----------+----------+--------------+ PFV      Full                                                        +---------+---------------+---------+-----------+----------+--------------+ POP      Full           Yes      Yes                                 +---------+---------------+---------+-----------+----------+--------------+ PTV      Full                                                         +---------+---------------+---------+-----------+----------+--------------+  PERO     Full                                                        +---------+---------------+---------+-----------+----------+--------------+     Summary: RIGHT: - There is no evidence of deep vein thrombosis in the lower extremity.  - No cystic structure found in the popliteal fossa.  LEFT: - There is no evidence of deep vein thrombosis in the lower extremity.  - No cystic structure found in the popliteal fossa.  *See table(s) above for measurements and observations. Electronically signed by Ruta Hinds MD on 02/10/2020 at 6:25:28 PM.    Final    US Abdomen Limited RUQ (LIVER/GB)  Result Date: 02/15/2020 CLINICAL DATA:  Right upper quadrant abdominal pain. History of prior cholecystectomy. EXAM: ULTRASOUND ABDOMEN LIMITED RIGHT UPPER QUADRANT COMPARISON:  None. FINDINGS: Gallbladder: The gallbladder is surgically absent. Common bile duct: Diameter: 4.9 mm Liver: No focal lesion identified. Within normal limits in parenchymal echogenicity. Portal vein is patent on color Doppler imaging with normal direction of blood flow towards the liver. Other: The study is limited secondary to overlying bowel gas. IMPRESSION: 1. Findings consistent with prior cholecystectomy. 2. Otherwise, unremarkable right upper quadrant ultrasound. Electronically Signed   By: Virgina Norfolk M.D.   On: 02/15/2020 21:02

## 2020-02-23 NOTE — Progress Notes (Signed)
Hydrologist [facility]) Hospital Liaison: RN note    Notified by Transition of Care Manger of patient/family request for [facility] services at home after discharge. Chart and patient information under review by [facility] physician. Hospice eligibility pending currently.    Writer spoke with  [name] to initiate education related to hospice philosophy, services and team approach to care. [name] verbalized understanding of information given. Per discussion, plan is for discharge to home by private vehicle depending on patient status.    Please send signed and completed DNR form home with patient/family. Patient will need prescriptions for discharge comfort medications.     DME needs have been discussed, patient currently has the following equipment in the home: O2, BSC, Shower chair.  Patient/family requests the following DME for delivery to the home: Wheelchair. [place] equipment manager has been notified and will contact DME provider to arrange delivery to the home. Home address has been verified and is correct in the chart. [name]  is the family member to contact to arrange time of delivery. Will order Wheelchair at discharge, but can discharge prior if medically stable.   [facility] aware of the above. Please notify [facility] when patient is ready to leave the unit at discharge. (Call [phone] or [phone] after 5pm.) [facility] information and contact numbers given to [facility].      Please call with any hospice related questions.     Thank you for this referral.    Clementeen Hoof, BSN, [facility] (listed on AMION under Hospice and [facility])  [phone]

## 2020-02-24 ENCOUNTER — Inpatient Hospital Stay (HOSPITAL_COMMUNITY): Payer: No Typology Code available for payment source

## 2020-02-24 DIAGNOSIS — M7989 Other specified soft tissue disorders: Secondary | ICD-10-CM | POA: Diagnosis not present

## 2020-02-24 DIAGNOSIS — Z7189 Other specified counseling: Secondary | ICD-10-CM

## 2020-02-24 DIAGNOSIS — C3490 Malignant neoplasm of unspecified part of unspecified bronchus or lung: Secondary | ICD-10-CM

## 2020-02-24 DIAGNOSIS — Z515 Encounter for palliative care: Secondary | ICD-10-CM

## 2020-02-24 LAB — COMPREHENSIVE METABOLIC PANEL
ALT: 26 U/L (ref 0–44)
AST: 27 U/L (ref 15–41)
Albumin: 2.5 g/dL — ABNORMAL LOW (ref 3.5–5.0)
Alkaline Phosphatase: 70 U/L (ref 38–126)
Anion gap: 7 (ref 5–15)
BUN: 42 mg/dL — ABNORMAL HIGH (ref 8–23)
CO2: 32 mmol/L (ref 22–32)
Calcium: 8.6 mg/dL — ABNORMAL LOW (ref 8.9–10.3)
Chloride: 100 mmol/L (ref 98–111)
Creatinine, Ser: 1.33 mg/dL — ABNORMAL HIGH (ref 0.61–1.24)
GFR, Estimated: 56 mL/min — ABNORMAL LOW (ref >60)
Glucose, Bld: 116 mg/dL — ABNORMAL HIGH (ref 70–99)
Potassium: 4 mmol/L (ref 3.5–5.1)
Sodium: 139 mmol/L (ref 135–145)
Total Bilirubin: 0.7 mg/dL (ref 0.3–1.2)
Total Protein: 6.3 g/dL — ABNORMAL LOW (ref 6.5–8.1)

## 2020-02-24 LAB — CBC
HCT: 39.1 % (ref 39.0–52.0)
Hemoglobin: 12.9 g/dL — ABNORMAL LOW (ref 13.0–17.0)
MCH: 33.1 pg (ref 26.0–34.0)
MCHC: 33 g/dL (ref 30.0–36.0)
MCV: 100.3 fL — ABNORMAL HIGH (ref 80.0–100.0)
Platelets: 221 10*3/uL (ref 150–400)
RBC: 3.9 MIL/uL — ABNORMAL LOW (ref 4.22–5.81)
RDW: 13.5 % (ref 11.5–15.5)
WBC: 10.1 10*3/uL (ref 4.0–10.5)
nRBC: 0 % (ref 0.0–0.2)

## 2020-02-24 LAB — GLUCOSE, CAPILLARY
Glucose-Capillary: 122 mg/dL — ABNORMAL HIGH (ref 70–99)
Glucose-Capillary: 191 mg/dL — ABNORMAL HIGH (ref 70–99)
Glucose-Capillary: 234 mg/dL — ABNORMAL HIGH (ref 70–99)
Glucose-Capillary: 211 mg/dL — ABNORMAL HIGH (ref 70–99)

## 2020-02-24 LAB — PROCALCITONIN: Procalcitonin: <0.1 ng/mL

## 2020-02-24 LAB — BRAIN NATRIURETIC PEPTIDE: B Natriuretic Peptide: 92.9 pg/mL (ref 0.0–100.0)

## 2020-02-24 LAB — C-REACTIVE PROTEIN: CRP: 0.7 mg/dL (ref <1.0)

## 2020-02-24 LAB — D-DIMER, QUANTITATIVE: D-Dimer, Quant: 1.85 ug{FEU}/mL — ABNORMAL HIGH (ref 0.00–0.50)

## 2020-02-24 MED ORDER — BARICITINIB 2 MG PO TABS
2.0000 mg | ORAL_TABLET | Freq: Every day | ORAL | Status: DC
  Administered 2020-02-25 – 2020-02-26 (×2): 2 mg via ORAL
  Filled 2020-02-24 (×2): qty 1

## 2020-02-24 NOTE — Progress Notes (Addendum)
Palliative:  HPI: 74 y.o. 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  Patient was recently hospitalized 12/14/2019 -12/17/2019 with neutropenic fever.  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  Patient was hospitalized again 02/08/2020-02/10/2020 with COVID-19 pneumonia.  Patient is now readmitted on 02/15/2020 with recurrent pneumonia.  Palliative care was consulted up address goals.  I met today with [name]. Discussed with RN concerns for fluctuating goals of care per patient. I met briefly with [name]. I introduced myself and explained that my colleague met with him and he referred him to speak with his wife. I explained that the discussion with his wife was that she felt he wants to return home and have hospice support there. He confirms this is the plan. I also explained that she told us that she did not think he would ever want [place] to place him on breathing machines and life support and pump on his chest and he confirms he would not want these measures. He denied any further complaints or concerns. He is anxious to return home as soon as possible.   Discussed plan with RN. We agreed best to just leave DNR bracelet off for patient comfort. Continue with plans for DNR and to return home with hospice support.   Exam: Alert, appears oriented but also very fatigued. With cough. No distress. Breathing regular, unlabored.  Plan: - DNR - Home with hospice  15 min  Vinie Sill, NP Palliative Medicine Team Pager [phone] (Please see [email or web address] for schedule) Team Phone [phone]    Greater than 50%  of this time was spent counseling and coordinating care related to the above assessment and plan

## 2020-02-24 NOTE — Consult Note (Signed)
[facility] Nurse Consult Note: Reason for Consult: Dried blood and secretions at left nostril (nare) and nose tip Wound type: N/A Pressure Injury POA: N/A Measurement: N/A Wound bed:N/A Drainage (amount, consistency, odor) Dried secretions resulting from prolonged high flow oxyden therapy Periwound:N/A Dressing procedure/placement/frequency:I have provided Nursing with guidance for the gradual removal of dried secretions using cotton balls soaked with H2O2 (hydrogen peroxide) and repeating every 2 hours for 3-5 minutes until secretions are lose and removable without trauma. I have provided guidance for nare hydration thereafter using Surgilube or other water-based lubricant. Note: petrolatum-based moisturizers are not to be used with oxygen therapy.  [facility] nursing team will not follow, but will remain available to this patient, the nursing and medical teams.  Please re-consult if needed. Thanks, Maudie Flakes, MSN, RN, [place], Arther Abbott  Pager# [phone]

## 2020-02-24 NOTE — Progress Notes (Signed)
Pt refusing placement of DNR bracelet. Pt states that he does not want to be a DNR. Provider notified. DNR order remains in place. Plan for discharger home with hospice in the next few days.

## 2020-02-24 NOTE — Progress Notes (Signed)
Pt continues to have thickening dried drainage to nose. Would not allow this nurse to cleanse. Pt desaturated after am care, pt allowed this nurse to clean HFNC, and provide warm compress to nose to attempt to remove dried blood. Pt allowed for dorsum to be cleaned, nostrils still thick with drainage. Pt states he can feel the flow of oxygen. Placed order for wound consult. Will continue to monitor

## 2020-02-24 NOTE — Progress Notes (Signed)
Physical Therapy Treatment Patient Details Name: Melvin Foley MRN: [id number] DOB: 1945-07-12 Today's Date: 02/24/2020    History of Present Illness 74 y.o. male with medical history significant of lung cancer (finished radiation 6 weeks ago), COPD, chronic diastolic CHF, CVA, schizophrenia, presented to ED 10/30 with known COVID infection (+10/22 with hospitalization 10/22-10/25) and worsening dyspnea. CTA negative for PE    PT Comments    Patient rather easily persuaded to get up and walk to assess ability to return home via private vehicle. Patient has reported a level entry previously, today states he has 2 steps. Educated to park close to steps and have chair inside near the entrance for sitting to rest immediately after up the steps. Attempted education re: slowing his gait velocity to allow better tolerance for walking. Attempted education on pursed lip breathing as he was recovering from dropping his sats to 79% while walking. Patient not changing his behavior in either attempt. Discussed with RN ?need for medical transport home. RN plans to discuss with wife when she calls wife.    Arrival, pt on 6L 85% sidelying;  Once seated EOB 92% walking 6L down to 79% HR 116 pt quickly lied on side, sats hovering 80%;  incr to 8L and agreed to turn on his back for chair-like position, HOB 45, with sats up to 90%,  End of session: returned to 6L sats 88% HR 103    Follow Up Recommendations  Supervision/Assistance - 24 hour;No PT follow up (Noted plans for Hospice once home)     Equipment Recommendations  None recommended by PT (pt denied need for hospital bed or medical transport)    Recommendations for Other Services       Precautions / Restrictions Precautions Precautions: Fall    Mobility  Bed Mobility Overal bed mobility: Needs Assistance Bed Mobility: Sidelying to Sit;Sit to Sidelying   Sidelying to sit: Supervision     Sit to sidelying: Supervision     Transfers Overall transfer level: Needs assistance Equipment used: None;Rolling walker (2 wheeled) Transfers: Sit to/from Stand Sit to Stand: Min guard         General transfer comment: min guard for cuing and safety  Ambulation/Gait Ambulation/Gait assistance: Min guard Gait Distance (Feet): 30 Feet Assistive device: Rolling walker (2 wheeled) Gait Pattern/deviations: Step-through pattern;Decreased stride length;Trunk flexed     General Gait Details: no imbalance, however walking too fast for his pulmonary status   Stairs             Wheelchair Mobility    Modified Rankin (Stroke Patients Only)       Balance Overall balance assessment: Needs assistance Sitting-balance support: No upper extremity supported;Feet supported Sitting balance-Leahy Scale: Good     Standing balance support: No upper extremity supported;During functional activity Standing balance-Leahy Scale: Poor Standing balance comment: reaching for UE support in standing                            Cognition Arousal/Alertness: Awake/alert Behavior During Therapy: Flat affect Overall Cognitive Status: No family/caregiver present to determine baseline cognitive functioning Area of Impairment: Attention;Following commands;Awareness;Problem solving;Safety/judgement                   Current Attention Level: Sustained   Following Commands: Follows one step commands inconsistently Safety/Judgement: Decreased awareness of safety Awareness: Intellectual Problem Solving: Slow processing;Difficulty sequencing;Requires verbal cues General Comments: Patient moves too quickly for his pulmonary status, yet will  not listen to cues to slow his pace. Sats dropped when walking and with return to sidelying (as instructing him to stay seated)      Exercises Other Exercises Other Exercises: pt not attempting pursed lip breathing as cuing for recovery. He did agree to turn on his back with  [facility] elevated (instead of staying flat on his side.     General Comments        Pertinent Vitals/Pain Pain Assessment: Faces Faces Pain Scale: Hurts little more Pain Location: back Pain Descriptors / Indicators: Discomfort;Sore Pain Intervention(s): Limited activity within patient's tolerance    Home Living                      Prior Function            PT Goals (current goals can now be found in the care plan section) Acute Rehab PT Goals Patient Stated Goal: go home ASAP Time For Goal Achievement: 03/02/20 Potential to Achieve Goals: Good Progress towards PT goals: Progressing toward goals    Frequency    Min 3X/week      PT Plan Discharge plan needs to be updated    Co-evaluation              AM-PAC PT "6 Clicks" Mobility   Outcome Measure  Help needed turning from your back to your side while in a flat bed without using bedrails?: None Help needed moving from lying on your back to sitting on the side of a flat bed without using bedrails?: None Help needed moving to and from a bed to a chair (including a wheelchair)?: A Little Help needed standing up from a chair using your arms (e.g., wheelchair or bedside chair)?: A Little Help needed to walk in hospital room?: A Little Help needed climbing 3-5 steps with a railing? : A Little 6 Click Score: 20    End of Session Equipment Utilized During Treatment: Oxygen Activity Tolerance: Patient limited by fatigue;Treatment limited secondary to medical complications (Comment) Patient left: in bed;with call bell/phone within reach;with bed alarm set Nurse Communication: Mobility status (refuses to sit in chair due to back pain) PT Visit Diagnosis: Difficulty in walking, not elsewhere classified (R26.2);Muscle weakness (generalized) (M62.81)     Time: 1000-1018 PT Time Calculation (min) (ACUTE ONLY): 18 min  Charges:  $Self Care/Home Management: 8-22                      Arby Barrette, PT Pager [phone]    Rexanne Mano 02/24/2020, 1:03 PM

## 2020-02-24 NOTE — Progress Notes (Signed)
Bilateral Lower Ext. study completed.   See CVProc for preliminary results.   Stafford Riviera, RDMS, RVT 

## 2020-02-24 NOTE — Progress Notes (Signed)
PROGRESS NOTE                                                                                                                                                                                                             Patient Demographics:    Melvin Foley, is a 74 y.o. male, DOB - September 16, 1945, WNU:[id number]  Outpatient Primary MD for the patient is Wallie Char, FNP   Admit date - 02/15/2020   LOS - 9  Chief Complaint  Patient presents with  . Covid Positive  . Pneumonia       Brief Narrative: Patient is a 74 y.o. male with PMHx of stage IIIb adenocarcinoma-finished radiation approximately 6 weeks back (follows at [facility])-recently hospitalized from 10/22-10/25 COVID-19 pneumonia-did not require O2 on discharge-presented back to the ED on 10/30 with shortness of breath-found to have severe hypoxic respiratory failure due to ongoing COVID-19 pneumonitis.  COVID-19 vaccinated status: Unvaccinated   Significant Events: 10/18>> evaluated at [facility] for hypotension/possible pneumonia-refused admission-Covid 19 negative 10/22-10/25>> hospitalization for Covid-discharged on room air. 10/30>> readmitted with severe hypoxemia-due to ongoing COVID-19 pneumonitis.  Significant studies: 10/22>>Chest x-ray: Patchy right mid/lower lung opacities concerning for pneumonia 10/22>> CTA chest: New airspace disease in the right upper/right lower lobe/left lower lobe-rounded area of masslike consolidation in the right lower lobe with cavitation again noted. 10/31>> CTA chest: No PE, worsening peripheral groundglass opacities, subacute right sided rib fractures along the seventh through ninth ribs.  COVID-19 medications: Steroids: 10/22>> Remdesivir: 10/22>>10/26 Baricitinib: 10/31>>  Antibiotics: Vancomycin: 10/30>> 10/31 Cefepime: 10/30>> 10/31  Microbiology data: 10/22 >>blood culture: No growth 10/30>>  blood culture: No growth  Procedures: None  Consults: None  DVT prophylaxis: enoxaparin (LOVENOX) injection 40 mg Start: 02/15/20 2300    Subjective:   Patient in bed, appears comfortable, denies any headache, no fever, no chest pain or pressure, no shortness of breath , no abdominal pain. No focal weakness.   Assessment  & Plan :   Acute hypoxic respiratory failure secondary to Covid 19 Viral pneumonia: Improving-Down to 2 L of oxygen at rest (at one point was on 12 L of HFNC).  Still with significant amount of exertional dyspnea.  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   Fever: afebrile  O2 requirements:  SpO2: 92 % O2 Flow Rate (L/min): 5 L/min   Prone/Incentive Spirometry: encouraged  incentive spirometry use 3-4/hour.   Recent Labs  Lab 02/20/20 0304 02/21/20 0747 02/22/20 0401 02/23/20 0302 02/24/20 0405  WBC 11.5* 10.3 11.3* 10.5 10.1  HGB 12.7* 13.2 14.1 13.1 12.9*  HCT 37.3* 39.3 41.9 39.1 39.1  PLT 197 206 245 227 221  CRP 1.1* 0.7 0.6 0.6 0.7  DDIMER 1.51* 1.25* 1.86* 1.98* 1.85*  AST 27 22 22 23 27   ALT 18 19 22 21 26   ALKPHOS 62 61 72 63 70  BILITOT 0.6 0.8 1.1 1.2 0.7  ALBUMIN 2.5* 2.5* 2.7* 2.5* 2.5*      Epistaxis-mild-resolved-treated with Afrin nasal spray.  Continue monitoring.  Elevated D-dimer.  Will check leg ultrasound.    COPD: No evidence of flare-continue bronchodilators  Chronic diastolic heart failure: Euvolemic.  Hypotension.  Resolved after IVF.  History of prior CVA/carotid endarterectomy: Continue Plavix.  Stage IIIb adenocarcinoma of the lung: Claims recently completed RTX at [facility] 6 weeks back-before that he was on chemo at the [facility] hospital.  CT chest continues to show right lower lobe mass.   Follow with oncology post discharge. However per spouse on 11/2-patient has made up his mind not to pursue any further treatment for this cancer.  Patient and spouse wish palliative care which has been involved, they eventually want home hospice which will be arranged.  Incidental finding of right rib fractures: Supportive care.  Does not have significant pain-apparently did fall off his bed (soft fall per wife) approximately a week back-per spouse-she was told that he is going to have weak ribs due to radiation.  Schizophrenia: Appears stable-continue thiothixene   Debility/deconditioning: Suspect has significant amount of debility/deconditioning at baseline but has more worsening due to acute illness-PT/OT eval completed-home health recommended-have ordered  Palliative care/goals of care discussion: Understands that he is severely weak with extensive parenchymal lung injury due to COVID-19 pneumonia-he reconfirms that he does not want to be intubated in case he worsens-but does want to pursue CPR for arrhythmias etc. He understood that he remained at significant risk for further decompensation from COVID-19 pneumonia-but thankfully with supportive care he has seemed to improve.  DO NOT INTUBATE in place-Per spouse-patient not keen on resuming cancer treatment-and she is requesting involvement of palliative care on discharge.     Condition - Guarded  Family Communication  : Spouse - Ruth-[phone]-on 02/23/20 02/24/2020, DNR and discharged with home hospice in a few days.  Code Status :  DNR  Diet :  Diet Order            DIET DYS 3 Room service appropriate? Yes; Fluid consistency: Thin  Diet effective now                  Disposition Plan  :   Status is: Inpatient  Remains inpatient appropriate because:Inpatient level of care appropriate due to severity of illness  Dispo: The patient is from: Home              Anticipated d/c is to: Home              Anticipated d/c date is: >  3 days              Patient currently is not medically stable to d/c.   Barriers to discharge: Hypoxia requiring O2 supplementation  Antimicorbials  :    Anti-infectives (From admission, onward)   Start     Dose/Rate Route Frequency Ordered Stop   02/16/20 1000  ceFEPIme (MAXIPIME) 2 g in sodium chloride 0.9 % 100 mL IVPB  Status:  Discontinued        2 g 200 mL/hr over 30 Minutes Intravenous Every 12 hours 02/15/20 1951 02/16/20 1105   02/16/20 0800  vancomycin (VANCOREADY) IVPB 500 mg/100 mL  Status:  Discontinued        500 mg 100 mL/hr over 60 Minutes Intravenous Every 12 hours 02/15/20 1844 02/16/20 1105   02/15/20 1845  ceFEPIme (MAXIPIME) 2 g in sodium chloride 0.9 % 100 mL IVPB        2 g 200 mL/hr over 30 Minutes Intravenous  Once 02/15/20 1839 02/15/20 1949   02/15/20 1845  vancomycin (VANCOREADY) IVPB 1500 mg/300 mL        1,500 mg 150 mL/hr over 120 Minutes Intravenous  Once 02/15/20 1844 02/15/20 2152      Inpatient Medications  Scheduled Meds: . vitamin C  500 mg Oral Daily  . baricitinib  4 mg Oral Daily  . benzonatate  200 mg Oral TID  . clopidogrel  75 mg Oral QHS  . enoxaparin (LOVENOX) injection  40 mg Subcutaneous Q24H  . feeding supplement  237 mL Oral TID BM  . fluticasone  2 spray Each Nare Daily  . fluticasone furoate-vilanterol  1 puff Inhalation Daily  . gemfibrozil  600 mg Oral BID AC  . guaiFENesin  600 mg Oral BID  . mouth rinse  15 mL Mouth Rinse BID  . methylPREDNISolone (SOLU-MEDROL) injection  40 mg Intravenous Daily  . oxymetazoline  1 spray Each Nare BID  . pantoprazole  40 mg Oral Daily  . rosuvastatin  40 mg Oral Daily  . sodium chloride flush  3 mL Intravenous Q12H  . thiothixene  15 mg Oral QHS  . zinc sulfate  220 mg Oral Daily   Continuous Infusions:  PRN Meds:.acetaminophen, albuterol, alum & mag hydroxide-simeth, chlorpheniramine-HYDROcodone, HYDROcodone-acetaminophen, morphine injection, [DISCONTINUED] ondansetron **OR**  ondansetron (ZOFRAN) IV, sodium chloride   Time Spent in minutes  25  See all Orders from today for further details   Lala Lund M.D on 02/24/2020 at 10:03 AM  To page go to [email or web address] - use universal password  [facility] -  Office  [phone]    Objective:   Vitals:   02/23/20 1959 02/23/20 2354 02/24/20 0415 02/24/20 0735  BP: 102/73 100/64 114/80 115/80  Pulse: 81 83 73 80  Resp: 18 16 20 20   Temp: 97.8 F (36.6 C) 97.8 F (36.6 C) 97.7 F (36.5 C) 98.1 F (36.7 C)  TempSrc: Axillary Axillary Axillary Oral  SpO2: 97% 95% 91% 92%  Weight:      Height:        Wt Readings from Last 3 Encounters:  02/16/20 67 kg  02/08/20 67.1 kg  02/03/20 69.7 kg     Intake/Output Summary (Last 24 hours) at 02/24/2020 1003 Last data filed at 02/24/2020 0700 Gross per 24 hour  Intake 960 ml  Output 850 ml  Net 110 ml     Physical Exam  Awake Alert, No new F.N deficits, Normal affect [place].AT,PERRAL Supple Neck,No JVD, No cervical lymphadenopathy appriciated.  Symmetrical Chest wall movement, Good air movement bilaterally, CTAB RRR,No Gallops, Rubs or new Murmurs, No Parasternal Heave +ve B.Sounds, Abd Soft, No tenderness, No organomegaly appriciated, No rebound - guarding or rigidity.  No Cyanosis, Clubbing or edema, No new Rash or bruise     Data Review:    CBC Recent Labs  Lab 02/18/20 0141 02/18/20 0141 02/19/20 0054 02/19/20 0054 02/20/20 0304 02/21/20 0747 02/22/20 0401 02/23/20 0302 02/24/20 0405  WBC 11.4*   < > 8.9   < > 11.5* 10.3 11.3* 10.5 10.1  HGB 11.7*   < > 11.9*   < > 12.7* 13.2 14.1 13.1 12.9*  HCT 34.7*   < > 35.0*   < > 37.3* 39.3 41.9 39.1 39.1  PLT 186   < > 195   < > 197 206 245 227 221  MCV 97.5   < > 98.9   < > 97.9 98.5 98.6 99.7 100.3*  MCH 32.9   < > 33.6   < > 33.3 33.1 33.2 33.4 33.1  MCHC 33.7   < > 34.0   < > 34.0 33.6 33.7 33.5 33.0  RDW 13.6   < > 13.8   < > 13.6 13.6 13.6 13.6 13.5  LYMPHSABS 0.2*  --  0.2*   --  0.2*  --   --   --   --   MONOABS 0.4  --  0.3  --  0.4  --   --   --   --   EOSABS 0.0  --  0.0  --  0.0  --   --   --   --   BASOSABS 0.0  --  0.0  --  0.0  --   --   --   --    < > = values in this interval not displayed.    Chemistries  Recent Labs  Lab 02/18/20 0141 02/18/20 0141 02/19/20 0054 02/19/20 0054 02/20/20 0304 02/21/20 0747 02/22/20 0401 02/23/20 0302 02/24/20 0405  NA 135   < > 134*   < > 133* 139 140 139 139  K 4.3   < > 4.3   < > 4.2 4.2 4.1 3.8 4.0  CL 103   < > 102   < > 98 101 97* 97* 100  CO2 23   < > 23   < > 25 26 29  32 32  GLUCOSE 176*   < > 139*   < > 156* 129* 161* 120* 116*  BUN 26*   < > 28*   < > 33* 37* 50* 48* 42*  CREATININE 0.90   < > 0.82   < > 1.12 0.99 1.08 1.16 1.33*  CALCIUM 8.7*   < > 8.6*   < > 8.6* 8.8* 9.0 8.6* 8.6*  MG 1.9  --  2.1  --  2.0  --   --   --   --   AST 29   < > 25   < > 27 22 22 23 27   ALT 16   < > 17   < > 18 19 22 21 26   ALKPHOS 56   < > 57   < > 62 61 72 63 70  BILITOT 0.6   < > 0.9   < > 0.6 0.8 1.1 1.2 0.7   < > = values in this interval not displayed.   ------------------------------------------------------------------------------------------------------------------ No results for input(s): CHOL, HDL, LDLCALC, TRIG, CHOLHDL, LDLDIRECT in the last 72 hours.  Lab Results  Component Value Date   HGBA1C 5.1 05/01/2017   ------------------------------------------------------------------------------------------------------------------ No results for input(s): TSH, T4TOTAL, T3FREE, THYROIDAB in the last 72 hours.  Invalid input(s): FREET3 ------------------------------------------------------------------------------------------------------------------ No results  for input(s): VITAMINB12, FOLATE, FERRITIN, TIBC, IRON, RETICCTPCT in the last 72 hours.  Coagulation profile No results for input(s): INR, PROTIME in the last 168 hours.  Recent Labs    02/23/20 0302 02/24/20 0405  DDIMER 1.98* 1.85*     Cardiac Enzymes No results for input(s): CKMB, TROPONINI, MYOGLOBIN in the last 168 hours.  Invalid input(s): CK ------------------------------------------------------------------------------------------------------------------    Component Value Date/Time   BNP 118.2 (H) 02/15/2020 1243    Micro Results Recent Results (from the past 240 hour(s))  Blood Culture (routine x 2)     Status: None   Collection Time: 02/15/20 12:45 PM   Specimen: BLOOD LEFT FOREARM  Result Value Ref Range Status   Specimen Description BLOOD LEFT FOREARM  Final   Special Requests   Final    BOTTLES DRAWN AEROBIC AND ANAEROBIC Blood Culture results may not be optimal due to an inadequate volume of blood received in culture bottles   Culture   Final    NO GROWTH 5 DAYS Performed at [facility], [place] [address] [place], [place] [address]    Report Status 02/20/2020 FINAL  Final  Blood Culture (routine x 2)     Status: None   Collection Time: 02/15/20  1:16 PM   Specimen: BLOOD  Result Value Ref Range Status   Specimen Description BLOOD RIGHT ANTECUBITAL  Final   Special Requests   Final    BOTTLES DRAWN AEROBIC AND ANAEROBIC Blood Culture results may not be optimal due to an inadequate volume of blood received in culture bottles   Culture   Final    NO GROWTH 5 DAYS Performed at [facility], [place] [address] [place],  [address]    Report Status 02/20/2020 FINAL  Final  MRSA PCR Screening     Status: None   Collection Time: 02/15/20  8:12 PM   Specimen: Nasal Mucosa; Nasopharyngeal  Result Value Ref Range Status   MRSA by PCR NEGATIVE NEGATIVE Final    Comment:        The GeneXpert MRSA Assay (FDA approved for NASAL specimens only), is one component of a comprehensive MRSA colonization surveillance program. It is not intended to diagnose MRSA infection nor to guide or monitor treatment for MRSA infections. Performed at [facility], [place] [address],  [place],  [address]     Radiology Reports DG Chest 2 View  Result Date: 02/03/2020 CLINICAL DATA:  Hypotension, shortness of breath EXAM: CHEST - 2 VIEW COMPARISON:  12/20/2019 FINDINGS: Patchy airspace disease in the lower lobes concerning for pneumonia. Heart is normal size. No effusions or pneumothorax. No acute bony abnormality. IMPRESSION: Patchy bilateral lower lobe airspace opacities concerning for pneumonia. Electronically Signed   By: Rolm Baptise M.D.   On: 02/03/2020 17:42   CT Angio Chest PE W and/or Wo Contrast  Result Date: 02/15/2020 CLINICAL DATA:  Worsening shortness of breath. EXAM: CT ANGIOGRAPHY CHEST WITH CONTRAST TECHNIQUE: Multidetector CT imaging of the chest was performed using the standard protocol during bolus administration of intravenous contrast. Multiplanar CT image reconstructions and MIPs were obtained to evaluate the vascular anatomy. CONTRAST:  39mL OMNIPAQUE IOHEXOL 350 MG/ML SOLN COMPARISON:  CT dated 02/07/2020 FINDINGS: Cardiovascular: Contrast injection is sufficient to demonstrate satisfactory opacification of the pulmonary arteries to the segmental level. There is no pulmonary embolus or evidence of right heart strain. The size of the main pulmonary artery is normal. Normal heart size with coronary artery calcification. The course and  caliber of the aorta are normal. There is mild atherosclerotic calcification. Opacification decreased due to pulmonary arterial phase contrast bolus timing. Mediastinum/Nodes: -- No mediastinal lymphadenopathy. -- No hilar lymphadenopathy. -- No axillary lymphadenopathy. -- No supraclavicular lymphadenopathy. -- Normal thyroid gland where visualized. -  Unremarkable esophagus. Lungs/Pleura: Again noted is a cavitary mass in the right lower lobe similar to prior study. There are persistent ground-glass airspace opacities in the right upper lobe which are essentially stable from prior study. There are worsening peripheral  ground-glass airspace opacities in the left lower and left upper lobe. There is no pneumothorax. No large pleural effusion. Upper Abdomen: Contrast bolus timing is not optimized for evaluation of the abdominal organs. The visualized portions of the organs of the upper abdomen are normal. Musculoskeletal: There are subacute right-sided rib fractures involving the seventh through ninth ribs. Review of the MIP images confirms the above findings. IMPRESSION: 1. No evidence for acute pulmonary embolus. 2. Worsening peripheral ground-glass airspace opacities in the left lower and left upper lobe, concerning for pneumonia (viral or bacterial) persistent cavitary mass in the right lower lobe. 3. Subacute right-sided rib fractures involving the seventh through ninth ribs. Aortic Atherosclerosis (ICD10-I70.0). Electronically Signed   By: Constance Holster M.D.   On: 02/15/2020 17:36   CT Angio Chest PE W and/or Wo Contrast  Result Date: 02/07/2020 CLINICAL DATA:  Positive D-dimer, shortness of breath, tachycardia. EXAM: CT ANGIOGRAPHY CHEST WITH CONTRAST TECHNIQUE: Multidetector CT imaging of the chest was performed using the standard protocol during bolus administration of intravenous contrast. Multiplanar CT image reconstructions and MIPs were obtained to evaluate the vascular anatomy. CONTRAST:  127mL OMNIPAQUE IOHEXOL 350 MG/ML SOLN COMPARISON:  12/20/2019 FINDINGS: Cardiovascular: No filling defects in the pulmonary arteries to suggest pulmonary emboli. Heart is normal size. Aortic and coronary artery calcifications. No aneurysm. Mediastinum/Nodes: Stable borderline sized right hilar lymph node. Scattered small and borderline sized mediastinal lymph nodes, stable. Trachea and esophagus are unremarkable. Thyroid unremarkable. Lungs/Pleura: Trace right pleural effusion. Masslike consolidation in the right lower lobe is again noted and is stable since prior study. New rounded masslike area of consolidation in the  right upper lobe measuring 2.5 cm. Increasing airspace disease posteriorly and peripherally in the right upper lobe and superior segment of the right lower lobe. Increasing ground-glass airspace opacity posteriorly in the superior segment of the left lower lobe. Paraseptal emphysema. Peripheral reticulation in the lungs bilaterally compatible with fibrosis. None none Upper Abdomen: Imaging into the upper abdomen demonstrates no acute findings. Musculoskeletal: Chest wall soft tissues are unremarkable. No acute bony abnormality. Review of the MIP images confirms the above findings. IMPRESSION: Rounded area of masslike consolidation in the right lower lobe with cavitation again noted, unchanged. New airspace disease seen in the right upper lobe and right lower lobe as well as superior segment of the left lower lobe. These areas are concerning for pneumonia. Areas of peripheral interstitial prominence and fibrosis bilaterally. Right hilar and borderline mediastinal lymph nodes are stable. Coronary artery disease. Aortic Atherosclerosis (ICD10-I70.0). Electronically Signed   By: Rolm Baptise M.D.   On: 02/07/2020 20:24   DG Chest Port 1 View  Result Date: 02/15/2020 CLINICAL DATA:  Cough, worsening dyspnea. EXAM: PORTABLE CHEST 1 VIEW COMPARISON:  02/07/2020 FINDINGS: Improved aeration at the right lung base compared to the prior examination. Interstitial thickening and chronic changes at the left lung base are unchanged. Heart and mediastinum are grossly stable. IMPRESSION: 1. Improved aeration at the right lung base compared to  the recent comparison examination. 2. Chronic changes. Electronically Signed   By: Markus Daft M.D.   On: 02/15/2020 13:16   DG Chest Portable 1 View  Result Date: 02/07/2020 CLINICAL DATA:  Shortness of breath, cough, congestion EXAM: PORTABLE CHEST 1 VIEW COMPARISON:  02/03/2020 FINDINGS: Airspace disease in the right mid and lower lung again noted, most confluent at the right lung  base compatible with pneumonia. Heart is normal size. Left lung clear. Previously seen patchy opacities at the left base have resolved. No effusions or acute bony abnormality. IMPRESSION: Continued patchy right mid and lower lung airspace opacities concerning for pneumonia. Clearing of the left basilar opacities since prior study. Electronically Signed   By: Rolm Baptise M.D.   On: 02/07/2020 18:51   VAS [place] LOWER EXTREMITY VENOUS (DVT)  Result Date: 02/10/2020  Lower Venous DVT Study Indications: Elevated Ddimer.  Risk Factors: COVID 19 positive. Comparison Study: No prior studies. Performing Technologist: Oliver Hum RVT  Examination Guidelines: A complete evaluation includes B-mode imaging, spectral Doppler, color Doppler, and power Doppler as needed of all accessible portions of each vessel. Bilateral testing is considered an integral part of a complete examination. Limited examinations for reoccurring indications may be performed as noted. The reflux portion of the exam is performed with the patient in reverse Trendelenburg.  +---------+---------------+---------+-----------+----------+--------------+ RIGHT    CompressibilityPhasicitySpontaneityPropertiesThrombus Aging +---------+---------------+---------+-----------+----------+--------------+ CFV      Full           Yes      Yes                                 +---------+---------------+---------+-----------+----------+--------------+ SFJ      Full                                                        +---------+---------------+---------+-----------+----------+--------------+ FV Prox  Full                                                        +---------+---------------+---------+-----------+----------+--------------+ FV Mid   Full                                                        +---------+---------------+---------+-----------+----------+--------------+ FV DistalFull                                                         +---------+---------------+---------+-----------+----------+--------------+ PFV      Full                                                        +---------+---------------+---------+-----------+----------+--------------+ POP  Full           Yes      Yes                                 +---------+---------------+---------+-----------+----------+--------------+ PTV      Full                                                        +---------+---------------+---------+-----------+----------+--------------+ PERO     Full                                                        +---------+---------------+---------+-----------+----------+--------------+   +---------+---------------+---------+-----------+----------+--------------+ LEFT     CompressibilityPhasicitySpontaneityPropertiesThrombus Aging +---------+---------------+---------+-----------+----------+--------------+ CFV      Full           Yes      Yes                                 +---------+---------------+---------+-----------+----------+--------------+ SFJ      Full                                                        +---------+---------------+---------+-----------+----------+--------------+ FV Prox  Full                                                        +---------+---------------+---------+-----------+----------+--------------+ FV Mid   Full                                                        +---------+---------------+---------+-----------+----------+--------------+ FV DistalFull                                                        +---------+---------------+---------+-----------+----------+--------------+ PFV      Full                                                        +---------+---------------+---------+-----------+----------+--------------+ POP      Full           Yes      Yes                                  +---------+---------------+---------+-----------+----------+--------------+  PTV      Full                                                        +---------+---------------+---------+-----------+----------+--------------+ PERO     Full                                                        +---------+---------------+---------+-----------+----------+--------------+     Summary: RIGHT: - There is no evidence of deep vein thrombosis in the lower extremity.  - No cystic structure found in the popliteal fossa.  LEFT: - There is no evidence of deep vein thrombosis in the lower extremity.  - No cystic structure found in the popliteal fossa.  *See table(s) above for measurements and observations. Electronically signed by Ruta Hinds MD on 02/10/2020 at 6:25:28 PM.    Final    US Abdomen Limited RUQ (LIVER/GB)  Result Date: 02/15/2020 CLINICAL DATA:  Right upper quadrant abdominal pain. History of prior cholecystectomy. EXAM: ULTRASOUND ABDOMEN LIMITED RIGHT UPPER QUADRANT COMPARISON:  None. FINDINGS: Gallbladder: The gallbladder is surgically absent. Common bile duct: Diameter: 4.9 mm Liver: No focal lesion identified. Within normal limits in parenchymal echogenicity. Portal vein is patent on color Doppler imaging with normal direction of blood flow towards the liver. Other: The study is limited secondary to overlying bowel gas. IMPRESSION: 1. Findings consistent with prior cholecystectomy. 2. Otherwise, unremarkable right upper quadrant ultrasound. Electronically Signed   By: Virgina Norfolk M.D.   On: 02/15/2020 21:02

## 2020-02-25 ENCOUNTER — Inpatient Hospital Stay (HOSPITAL_COMMUNITY): Payer: No Typology Code available for payment source

## 2020-02-25 DIAGNOSIS — C3490 Malignant neoplasm of unspecified part of unspecified bronchus or lung: Secondary | ICD-10-CM

## 2020-02-25 LAB — CBC
HCT: 39.7 % (ref 39.0–52.0)
Hemoglobin: 13.2 g/dL (ref 13.0–17.0)
MCH: 33 pg (ref 26.0–34.0)
MCHC: 33.2 g/dL (ref 30.0–36.0)
MCV: 99.3 fL (ref 80.0–100.0)
Platelets: 221 10*3/uL (ref 150–400)
RBC: 4 MIL/uL — ABNORMAL LOW (ref 4.22–5.81)
RDW: 13.3 % (ref 11.5–15.5)
WBC: 8.8 10*3/uL (ref 4.0–10.5)
nRBC: 0 % (ref 0.0–0.2)

## 2020-02-25 LAB — GLUCOSE, CAPILLARY
Glucose-Capillary: 107 mg/dL — ABNORMAL HIGH (ref 70–99)
Glucose-Capillary: 158 mg/dL — ABNORMAL HIGH (ref 70–99)
Glucose-Capillary: 229 mg/dL — ABNORMAL HIGH (ref 70–99)
Glucose-Capillary: 196 mg/dL — ABNORMAL HIGH (ref 70–99)

## 2020-02-25 LAB — BRAIN NATRIURETIC PEPTIDE: B Natriuretic Peptide: 114 pg/mL — ABNORMAL HIGH (ref 0.0–100.0)

## 2020-02-25 LAB — PROCALCITONIN: Procalcitonin: <0.1 ng/mL

## 2020-02-25 LAB — D-DIMER, QUANTITATIVE: D-Dimer, Quant: 2 ug/mL-FEU — ABNORMAL HIGH (ref 0.00–0.50)

## 2020-02-25 LAB — C-REACTIVE PROTEIN: CRP: 0.6 mg/dL (ref <1.0)

## 2020-02-25 MED ORDER — FUROSEMIDE 10 MG/ML IJ SOLN
60.0000 mg | Freq: Once | INTRAMUSCULAR | Status: AC
  Administered 2020-02-25: 60 mg via INTRAVENOUS
  Filled 2020-02-25: qty 6

## 2020-02-25 MED ORDER — LACTATED RINGERS IV BOLUS
500.0000 mL | Freq: Once | INTRAVENOUS | Status: AC
  Administered 2020-02-25: 500 mL via INTRAVENOUS

## 2020-02-25 MED ORDER — METOPROLOL TARTRATE 25 MG PO TABS
25.0000 mg | ORAL_TABLET | Freq: Two times a day (BID) | ORAL | Status: DC
  Administered 2020-02-25: 25 mg via ORAL
  Filled 2020-02-25 (×2): qty 1

## 2020-02-25 MED ORDER — METOPROLOL TARTRATE 5 MG/5ML IV SOLN
INTRAVENOUS | Status: AC
  Filled 2020-02-25: qty 5

## 2020-02-25 MED ORDER — METOPROLOL TARTRATE 5 MG/5ML IV SOLN
5.0000 mg | Freq: Three times a day (TID) | INTRAVENOUS | Status: DC | PRN
  Administered 2020-02-25 – 2020-02-26 (×2): 5 mg via INTRAVENOUS
  Filled 2020-02-25: qty 5

## 2020-02-25 NOTE — Progress Notes (Signed)
Occupational Therapy Treatment Patient Details Name: Melvin Foley MRN: [id number] DOB: May 03, 1945 Today's Date: 02/25/2020    History of present illness 74 y.o. male with medical history significant of lung cancer (finished radiation 6 weeks ago), COPD, chronic diastolic CHF, CVA, schizophrenia, presented to ED 10/30 with known COVID infection (+10/22 with hospitalization 10/22-10/25) and worsening dyspnea. CTA negative for PE   OT comments  Upon arrival, pt supine in bed attempting to eat breakfast. Pt with poor posture and eating as quickly as possible. SpO2 75% on 2L, RR 37-41, HR 157. Elevating to 3L, 4L and then 6L O2. Repositioning pt in bed with Min A for more upright posture. Cueing for purse lip breathing. Called for RN. RN entering room and placed pt on 15L NRB. SpO2 elevating to 80% and HR 140s. RT entering room and placed on 15L HFNC and 15L NRB. SpO2 90%, RR 30s, and HR 133. Continued to provide cues for slowing RR and purse lip breathing. Educating pt on prone position, but pt refusing. Currently plan to dc to home with family and hospice. Will continue to follow acutely to facilitate safe dc.    Follow Up Recommendations  Home health OT;Supervision/Assistance - 24 hour    Equipment Recommendations  3 in 1 bedside commode    Recommendations for Other Services PT consult    Precautions / Restrictions Precautions Precautions: Fall       Mobility Bed Mobility Overal bed mobility: Needs Assistance             General bed mobility comments: Min A to bring towards EOB and correct position. Pt using bed rails to pull and use of BLEs to push  Transfers                 General transfer comment: Defer for safety    Balance                                           ADL either performed or assessed with clinical judgement   ADL Overall ADL's : Needs assistance/impaired Eating/Feeding: Supervision/ safety;Set up;Bed  level Eating/Feeding Details (indicate cue type and reason): Upon arrival, pt supine in bed and having slid down into poor position to eat. Pt eating quickly and RR 40s. Pt with poor awareness and requiring cues to slow down and breath while eating.                                    General ADL Comments: Upon arrival, HR 150s, RR 40s, and SpO2 75% on 2L. Elevating to 3L, 4L, and then 6L. SpO2 elevating to 80% with cues to slow RR and use purse lip breathing. Repositioning in bed to elevate trunk into more upright position. RN arriving and placing pt on NRB 15L. RT arriving pt placing pt on 15L HFNC and 15L NRB.      Vision       Perception     Praxis      Cognition Arousal/Alertness: Awake/alert Behavior During Therapy: Flat affect Overall Cognitive Status: No family/caregiver present to determine baseline cognitive functioning Area of Impairment: Attention;Following commands;Awareness;Problem solving;Safety/judgement                   Current Attention Level: Sustained   Following Commands: Follows one step commands inconsistently  Safety/Judgement: Decreased awareness of safety Awareness: Intellectual Problem Solving: Slow processing;Difficulty sequencing;Requires verbal cues General Comments: Upon arrival, pt supine in bed and attempting to eat. However, very low in bed and poor positioning. Eating quickly and with poor awareness of pacing. Pt also requiring increased cues throughout and needing increased time to follow commands. When attempting education about prone positioning pt because upset and quickly frustrated.        Exercises Exercises: Other exercises Other Exercises Other Exercises: Cues for purse lip breathing   Shoulder Instructions       General Comments Upon arrival, SpO2 75% on 2L, RR 37-41, HR 157. At end of session, SpO2 SpO2 92% on 15L HFNC and 15L NRB, RR 32, and HR 131.     Pertinent Vitals/ Pain       Pain Assessment:  Faces Faces Pain Scale: Hurts a little bit Pain Location: back Pain Descriptors / Indicators: Discomfort;Sore Pain Intervention(s): Monitored during session;Repositioned  Home Living                                          Prior Functioning/Environment              Frequency  Min 2X/week        Progress Toward Goals  OT Goals(current goals can now be found in the care plan section)  Progress towards OT goals: Not progressing toward goals - comment  Acute Rehab OT Goals Patient Stated Goal: go home ASAP OT Goal Formulation: With patient Time For Goal Achievement: 03/02/20 Potential to Achieve Goals: Good ADL Goals Pt Will Perform Grooming: with modified independence;standing;sitting Pt Will Perform Lower Body Dressing: with modified independence;sit to/from stand;sitting/lateral leans Pt Will Transfer to Toilet: with modified independence;ambulating;bedside commode Pt Will Perform Toileting - Clothing Manipulation and hygiene: with modified independence;sitting/lateral leans;sit to/from stand Additional ADL Goal #1: Pt will independently monitor SpO2 and use purse lip breathing for ADLs Additional ADL Goal #2: Pt will independently verbalize three energy conservation techniques for ADLs and IADLs  Plan Discharge plan remains appropriate;Other (comment) (Plan for dc home with hospice)    Co-evaluation                 AM-PAC OT "6 Clicks" Daily Activity     Outcome Measure   Help from another person eating meals?: None Help from another person taking care of personal grooming?: A Little Help from another person toileting, which includes using toliet, bedpan, or urinal?: A Little Help from another person bathing (including washing, rinsing, drying)?: A Little Help from another person to put on and taking off regular upper body clothing?: A Little Help from another person to put on and taking off regular lower body clothing?: A Little 6 Click  Score: 19    End of Session Equipment Utilized During Treatment: Oxygen  OT Visit Diagnosis: Unsteadiness on feet (R26.81);Other abnormalities of gait and mobility (R26.89);Muscle weakness (generalized) (M62.81)   Activity Tolerance Patient limited by fatigue;Other (comment) (Vitals)   Patient Left in bed;with call bell/phone within reach;with nursing/sitter in room   Nurse Communication Mobility status        Time: 1572-6203 OT Time Calculation (min): 18 min  Charges: OT General Charges $OT Visit: 1 Visit OT Treatments $Self Care/Home Management : 8-22 mins  [place], OTR/L Acute Rehab Pager: [phone] Office: [place] 02/25/2020, 8:52 AM

## 2020-02-25 NOTE — Progress Notes (Signed)
Nutrition Follow-up  DOCUMENTATION CODES:   Not applicable  INTERVENTION:  Continue Ensure Enlive po TID, each supplement provides 350 kcal and 20 grams of protein  Continue Magic cup BID with meals, each supplement provides 290 kcal and 9 grams of protein.  Encourage adequate PO intake.   NUTRITION DIAGNOSIS:   Increased nutrient needs related to cancer and cancer related treatments, acute illness (pneumonitis secondary to COVID-19 virus; stage IIIb lung cancer s/p radiation) as evidenced by estimated needs; ongoing  GOAL:   Patient will meet greater than or equal to 90% of their needs; progressing  MONITOR:   PO intake, Weight trends, Labs, I & O's, Supplement acceptance  REASON FOR ASSESSMENT:   Malnutrition Screening Tool    ASSESSMENT:   74 year old male with history significant of stage IIIb lung cancer s/p radiation followed by [facility] and [place], schizophrenia, dCHF, GERD, HTN, OSA, stroke, Bipolar 1 disorder, carotid stenosis, who was recently admitted 10/22-10/25 due to COVID-19 infection presented to ED with shortness of breath. He was found to have severe hypoxic respiratory failure due to ongoing COVID-19 pneumonitis.  Pt is currently on 15 L HFNC via NRB. Pt with worsened hypoxia. Meal completion has been 50-100%. Pt currently has Ensure ordered and has been consuming them. Per MD, if pt declines further, family requests comfort care.   Labs and medications reviewed.   Diet Order:   Diet Order            DIET DYS 3 Room service appropriate? Yes; Fluid consistency: Thin  Diet effective now                 EDUCATION NEEDS:   Not appropriate for education at this time  Skin:  Skin Assessment: Reviewed RN Assessment  Last BM:  11/7  Height:   Ht Readings from Last 1 Encounters:  02/16/20 5\' 8"  (1.727 m)    Weight:   Wt Readings from Last 1 Encounters:  02/16/20 67 kg   BMI:  Body mass index is 22.46 kg/m.  Estimated Nutritional Needs:    Kcal:  2000-2300  Protein:  100-115 grams  Fluid:  > 2L/day  Corrin Parker, MS, RD, LDN RD pager number/after hours weekend pager number on Amion.

## 2020-02-25 NOTE — Progress Notes (Signed)
PROGRESS NOTE                                                                                                                                                                                                             Patient Demographics:    Melvin Foley, is a 74 y.o. male, DOB - 1945-04-21, GXQ:[id number]  Outpatient Primary MD for the patient is Wallie Char, FNP   Admit date - 02/15/2020   LOS - 10  Chief Complaint  Patient presents with  . Covid Positive  . Pneumonia       Brief Narrative: Patient is a 74 y.o. male with PMHx of stage IIIb adenocarcinoma-finished radiation approximately 6 weeks back (follows at [facility])-recently hospitalized from 10/22-10/25 COVID-19 pneumonia-did not require O2 on discharge-presented back to the ED on 10/30 with shortness of breath-found to have severe hypoxic respiratory failure due to ongoing COVID-19 pneumonitis.  COVID-19 vaccinated status: Unvaccinated   Significant Events: 10/18>> evaluated at [facility] for hypotension/possible pneumonia-refused admission-Covid 19 negative 10/22-10/25>> hospitalization for Covid-discharged on room air. 10/30>> readmitted with severe hypoxemia-due to ongoing COVID-19 pneumonitis.  Significant studies: 10/22>>Chest x-ray: Patchy right mid/lower lung opacities concerning for pneumonia 10/22>> CTA chest: New airspace disease in the right upper/right lower lobe/left lower lobe-rounded area of masslike consolidation in the right lower lobe with cavitation again noted. 10/31>> CTA chest: No PE, worsening peripheral groundglass opacities, subacute right sided rib fractures along the seventh through ninth ribs.  COVID-19 medications: Steroids: 10/22>> Remdesivir: 10/22>>10/26 Baricitinib: 10/31>>  Antibiotics: Vancomycin: 10/30>> 10/31 Cefepime: 10/30>> 10/31  Microbiology data: 10/22 >>blood culture: No growth 10/30>>  blood culture: No growth  Procedures: None  Consults: None  DVT prophylaxis: enoxaparin (LOVENOX) injection 40 mg Start: 02/15/20 2300    Subjective:   Patient in bed, appears comfortable, denies any headache, no fever, no chest pain or pressure, +ve shortness of breath , no abdominal pain. No focal weakness.   Assessment  & Plan :   Acute hypoxic respiratory failure secondary to Covid 19 Viral pneumonia: Improving-Down to 2 L of oxygen at rest (at one point was on 12 L of HFNC).  Still with significant amount of exertional dyspnea.  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   Also has lung malignancy which he has chosen not to get treatment for, suddenly became more hypoxic on 02/25/2020, trial of IV Lasix and monitor.  Per wife if he declines further comfort measures.  Fever: afebrile  O2 requirements:  SpO2: 91 % O2 Flow Rate (L/min): 11 L/min   Prone/Incentive Spirometry: encouraged  incentive spirometry use 3-4/hour.   Recent Labs  Lab 02/20/20 0304 02/20/20 0304 02/21/20 0747 02/22/20 0401 02/23/20 0302 02/24/20 0405 02/25/20 0134  WBC 11.5*   < > 10.3 11.3* 10.5 10.1 8.8  HGB 12.7*   < > 13.2 14.1 13.1 12.9* 13.2  HCT 37.3*   < > 39.3 41.9 39.1 39.1 39.7  PLT 197   < > 206 245 227 221 221  CRP 1.1*   < > 0.7 0.6 0.6 0.7 0.6  BNP  --   --   --   --   --  92.9 114.0*  DDIMER 1.51*   < > 1.25* 1.86* 1.98* 1.85* 2.00*  PROCALCITON  --   --   --   --   --  <0.10 <0.10  AST 27  --  22 22 23 27   --   ALT 18  --  19 22 21 26   --   ALKPHOS 62  --  61 72 63 70  --   BILITOT 0.6  --  0.8 1.1 1.2 0.7  --   ALBUMIN 2.5*  --  2.5* 2.7* 2.5* 2.5*  --    < > = values in this interval not displayed.      Epistaxis-mild-resolved-treated with Afrin nasal  spray.  Continue monitoring.  Elevated D-dimer.  Negative leg ultrasound.  COPD: No evidence of flare-continue bronchodilators  Chronic diastolic heart failure: Euvolemic.  Hypotension.  Resolved after IVF.  History of prior CVA/carotid endarterectomy: Continue Plavix.  Stage IIIb adenocarcinoma of the lung: Claims recently completed RTX at [facility] 6 weeks back-before that he was on chemo at the [facility] hospital.  CT chest continues to show right lower lobe mass.  Follow with oncology post discharge. However per spouse on 11/2-patient has made up his mind not to pursue any further treatment for this cancer.  Patient and spouse wish palliative care which has been involved, they eventually want home hospice which will be arranged.  Incidental finding of right rib fractures: Supportive care.  Does not have significant pain-apparently did fall off his bed (soft fall per wife) approximately a week back-per spouse-she was told that he is going to have weak ribs due to radiation.  Schizophrenia: Appears stable-continue thiothixene   Debility/deconditioning: Suspect has significant amount of debility/deconditioning at baseline but has more worsening due to acute illness-PT/OT eval completed-home health recommended-have ordered.   Palliative care/goals of care discussion: He has stage IIIb lung cancer for which him and his wife decided to pursue home hospice.  Will initiate comfort measures here if he declines further.  Cussed with wife on 02/25/2020.    Condition - Guarded  Family Communication  : Spouse - [phone]-on 02/23/20 02/24/2020, DNR and discharged with home hospice in a few days. Updated 02/25/20 agrees to pursue comfort measures if he declines further here.  Code Status :  DNR  Diet :  Diet Order            DIET DYS 3 Room service appropriate? Yes; Fluid consistency: Thin  Diet effective now  Disposition Plan  :   Status is: Inpatient  Remains  inpatient appropriate because:Inpatient level of care appropriate due to severity of illness  Dispo: The patient is from: Home              Anticipated d/c is to: Home              Anticipated d/c date is: > 3 days              Patient currently is not medically stable to d/c.   Barriers to discharge: Hypoxia requiring O2 supplementation  Antimicorbials  :    Anti-infectives (From admission, onward)   Start     Dose/Rate Route Frequency Ordered Stop   02/16/20 1000  ceFEPIme (MAXIPIME) 2 g in sodium chloride 0.9 % 100 mL IVPB  Status:  Discontinued        2 g 200 mL/hr over 30 Minutes Intravenous Every 12 hours 02/15/20 1951 02/16/20 1105   02/16/20 0800  vancomycin (VANCOREADY) IVPB 500 mg/100 mL  Status:  Discontinued        500 mg 100 mL/hr over 60 Minutes Intravenous Every 12 hours 02/15/20 1844 02/16/20 1105   02/15/20 1845  ceFEPIme (MAXIPIME) 2 g in sodium chloride 0.9 % 100 mL IVPB        2 g 200 mL/hr over 30 Minutes Intravenous  Once 02/15/20 1839 02/15/20 1949   02/15/20 1845  vancomycin (VANCOREADY) IVPB 1500 mg/300 mL        1,500 mg 150 mL/hr over 120 Minutes Intravenous  Once 02/15/20 1844 02/15/20 2152      Inpatient Medications  Scheduled Meds: . vitamin C  500 mg Oral Daily  . baricitinib  2 mg Oral Daily  . benzonatate  200 mg Oral TID  . clopidogrel  75 mg Oral QHS  . enoxaparin (LOVENOX) injection  40 mg Subcutaneous Q24H  . feeding supplement  237 mL Oral TID BM  . fluticasone  2 spray Each Nare Daily  . fluticasone furoate-vilanterol  1 puff Inhalation Daily  . gemfibrozil  600 mg Oral BID AC  . guaiFENesin  600 mg Oral BID  . mouth rinse  15 mL Mouth Rinse BID  . methylPREDNISolone (SOLU-MEDROL) injection  40 mg Intravenous Daily  . metoprolol tartrate      . metoprolol tartrate  25 mg Oral BID  . oxymetazoline  1 spray Each Nare BID  . pantoprazole  40 mg Oral Daily  . rosuvastatin  40 mg Oral Daily  . sodium chloride flush  3 mL Intravenous  Q12H  . thiothixene  15 mg Oral QHS  . zinc sulfate  220 mg Oral Daily   Continuous Infusions:  PRN Meds:.acetaminophen, albuterol, alum & mag hydroxide-simeth, chlorpheniramine-HYDROcodone, metoprolol tartrate, morphine injection, [DISCONTINUED] ondansetron **OR** ondansetron (ZOFRAN) IV, sodium chloride   Time Spent in minutes  25  See all Orders from today for further details   Lala Lund M.D on 02/25/2020 at 10:31 AM  To page go to [email or web address] - use universal password  [facility] -  Office  [phone]    Objective:   Vitals:   02/25/20 0726 02/25/20 0746 02/25/20 0800 02/25/20 0831  BP: 100/78  105/76   Pulse: 86     Resp: 20     Temp: 98.1 F (36.7 C)     TempSrc: Oral     SpO2: 100% 92%  91%  Weight:      Height:  Wt Readings from Last 3 Encounters:  02/16/20 67 kg  02/08/20 67.1 kg  02/03/20 69.7 kg     Intake/Output Summary (Last 24 hours) at 02/25/2020 1031 Last data filed at 02/25/2020 1008 Gross per 24 hour  Intake 420 ml  Output 900 ml  Net -480 ml     Physical Exam  Awake Alert, No new F.N deficits, Normal affect [place].AT,PERRAL Supple Neck,No JVD, No cervical lymphadenopathy appriciated.  Symmetrical Chest wall movement, Good air movement bilaterally,+ve rales RRR,No Gallops, Rubs or new Murmurs, No Parasternal Heave +ve B.Sounds, Abd Soft, No tenderness, No organomegaly appriciated, No rebound - guarding or rigidity. No Cyanosis, Clubbing or edema, No new Rash or bruise      Data Review:    CBC Recent Labs  Lab 02/19/20 0054 02/19/20 0054 02/20/20 0304 02/20/20 0304 02/21/20 0747 02/22/20 0401 02/23/20 0302 02/24/20 0405 02/25/20 0134  WBC 8.9   < > 11.5*   < > 10.3 11.3* 10.5 10.1 8.8  HGB 11.9*   < > 12.7*   < > 13.2 14.1 13.1 12.9* 13.2  HCT 35.0*   < > 37.3*   < > 39.3 41.9 39.1 39.1 39.7  PLT 195   < > 197   < > 206 245 227 221 221  MCV 98.9   < > 97.9   < > 98.5 98.6 99.7 100.3* 99.3  MCH 33.6   <  > 33.3   < > 33.1 33.2 33.4 33.1 33.0  MCHC 34.0   < > 34.0   < > 33.6 33.7 33.5 33.0 33.2  RDW 13.8   < > 13.6   < > 13.6 13.6 13.6 13.5 13.3  LYMPHSABS 0.2*  --  0.2*  --   --   --   --   --   --   MONOABS 0.3  --  0.4  --   --   --   --   --   --   EOSABS 0.0  --  0.0  --   --   --   --   --   --   BASOSABS 0.0  --  0.0  --   --   --   --   --   --    < > = values in this interval not displayed.    Chemistries  Recent Labs  Lab 02/19/20 0054 02/19/20 0054 02/20/20 0304 02/21/20 0747 02/22/20 0401 02/23/20 0302 02/24/20 0405  NA 134*   < > 133* 139 140 139 139  K 4.3   < > 4.2 4.2 4.1 3.8 4.0  CL 102   < > 98 101 97* 97* 100  CO2 23   < > 25 26 29  32 32  GLUCOSE 139*   < > 156* 129* 161* 120* 116*  BUN 28*   < > 33* 37* 50* 48* 42*  CREATININE 0.82   < > 1.12 0.99 1.08 1.16 1.33*  CALCIUM 8.6*   < > 8.6* 8.8* 9.0 8.6* 8.6*  MG 2.1  --  2.0  --   --   --   --   AST 25   < > 27 22 22 23 27   ALT 17   < > 18 19 22 21 26   ALKPHOS 57   < > 62 61 72 63 70  BILITOT 0.9   < > 0.6 0.8 1.1 1.2 0.7   < > = values in this interval not displayed.   ------------------------------------------------------------------------------------------------------------------  No results for input(s): CHOL, HDL, LDLCALC, TRIG, CHOLHDL, LDLDIRECT in the last 72 hours.  Lab Results  Component Value Date   HGBA1C 5.1 05/01/2017   ------------------------------------------------------------------------------------------------------------------ No results for input(s): TSH, T4TOTAL, T3FREE, THYROIDAB in the last 72 hours.  Invalid input(s): FREET3 ------------------------------------------------------------------------------------------------------------------ No results for input(s): VITAMINB12, FOLATE, FERRITIN, TIBC, IRON, RETICCTPCT in the last 72 hours.  Coagulation profile No results for input(s): INR, PROTIME in the last 168 hours.  Recent Labs    02/24/20 0405 02/25/20 0134  DDIMER  1.85* 2.00*    Cardiac Enzymes No results for input(s): CKMB, TROPONINI, MYOGLOBIN in the last 168 hours.  Invalid input(s): CK ------------------------------------------------------------------------------------------------------------------    Component Value Date/Time   BNP 114.0 (H) 02/25/2020 0134    Micro Results Recent Results (from the past 240 hour(s))  Blood Culture (routine x 2)     Status: None   Collection Time: 02/15/20 12:45 PM   Specimen: BLOOD LEFT FOREARM  Result Value Ref Range Status   Specimen Description BLOOD LEFT FOREARM  Final   Special Requests   Final    BOTTLES DRAWN AEROBIC AND ANAEROBIC Blood Culture results may not be optimal due to an inadequate volume of blood received in culture bottles   Culture   Final    NO GROWTH 5 DAYS Performed at [facility], [place] [address] [place], [place] [address]    Report Status 02/20/2020 FINAL  Final  Blood Culture (routine x 2)     Status: None   Collection Time: 02/15/20  1:16 PM   Specimen: BLOOD  Result Value Ref Range Status   Specimen Description BLOOD RIGHT ANTECUBITAL  Final   Special Requests   Final    BOTTLES DRAWN AEROBIC AND ANAEROBIC Blood Culture results may not be optimal due to an inadequate volume of blood received in culture bottles   Culture   Final    NO GROWTH 5 DAYS Performed at [facility], [place] [address], [place], [place] [address]    Report Status 02/20/2020 FINAL  Final  MRSA PCR Screening     Status: None   Collection Time: 02/15/20  8:12 PM   Specimen: Nasal Mucosa; Nasopharyngeal  Result Value Ref Range Status   MRSA by PCR NEGATIVE NEGATIVE Final    Comment:        The GeneXpert MRSA Assay (FDA approved for NASAL specimens only), is one component of a comprehensive MRSA colonization surveillance program. It is not intended to diagnose MRSA infection nor to guide or monitor treatment for MRSA infections. Performed at [facility], [place] [address], [place], [place] [address]     Radiology Reports DG Chest 2 View  Result Date: 02/03/2020 CLINICAL DATA:  Hypotension, shortness of breath EXAM: CHEST - 2 VIEW COMPARISON:  12/20/2019 FINDINGS: Patchy airspace disease in the lower lobes concerning for pneumonia. Heart is normal size. No effusions or pneumothorax. No acute bony abnormality. IMPRESSION: Patchy bilateral lower lobe airspace opacities concerning for pneumonia. Electronically Signed   By: Rolm Baptise M.D.   On: 02/03/2020 17:42   CT Angio Chest PE W and/or Wo Contrast  Result Date: 02/15/2020 CLINICAL DATA:  Worsening shortness of breath. EXAM: CT ANGIOGRAPHY CHEST WITH CONTRAST TECHNIQUE: Multidetector CT imaging of the chest was performed using the standard protocol during bolus administration of intravenous contrast. Multiplanar CT image reconstructions and MIPs were obtained to evaluate the vascular anatomy. CONTRAST:  49mL OMNIPAQUE IOHEXOL 350 MG/ML SOLN COMPARISON:  CT  dated 02/07/2020 FINDINGS: Cardiovascular: Contrast injection is sufficient to demonstrate satisfactory opacification of the pulmonary arteries to the segmental level. There is no pulmonary embolus or evidence of right heart strain. The size of the main pulmonary artery is normal. Normal heart size with coronary artery calcification. The course and caliber of the aorta are normal. There is mild atherosclerotic calcification. Opacification decreased due to pulmonary arterial phase contrast bolus timing. Mediastinum/Nodes: -- No mediastinal lymphadenopathy. -- No hilar lymphadenopathy. -- No axillary lymphadenopathy. -- No supraclavicular lymphadenopathy. -- Normal thyroid gland where visualized. -  Unremarkable esophagus. Lungs/Pleura: Again noted is a cavitary mass in the right lower lobe similar to prior study. There are persistent ground-glass airspace opacities in the right upper lobe which are essentially stable from prior study. There are worsening peripheral  ground-glass airspace opacities in the left lower and left upper lobe. There is no pneumothorax. No large pleural effusion. Upper Abdomen: Contrast bolus timing is not optimized for evaluation of the abdominal organs. The visualized portions of the organs of the upper abdomen are normal. Musculoskeletal: There are subacute right-sided rib fractures involving the seventh through ninth ribs. Review of the MIP images confirms the above findings. IMPRESSION: 1. No evidence for acute pulmonary embolus. 2. Worsening peripheral ground-glass airspace opacities in the left lower and left upper lobe, concerning for pneumonia (viral or bacterial) persistent cavitary mass in the right lower lobe. 3. Subacute right-sided rib fractures involving the seventh through ninth ribs. Aortic Atherosclerosis (ICD10-I70.0). Electronically Signed   By: Constance Holster M.D.   On: 02/15/2020 17:36   CT Angio Chest PE W and/or Wo Contrast  Result Date: 02/07/2020 CLINICAL DATA:  Positive D-dimer, shortness of breath, tachycardia. EXAM: CT ANGIOGRAPHY CHEST WITH CONTRAST TECHNIQUE: Multidetector CT imaging of the chest was performed using the standard protocol during bolus administration of intravenous contrast. Multiplanar CT image reconstructions and MIPs were obtained to evaluate the vascular anatomy. CONTRAST:  133mL OMNIPAQUE IOHEXOL 350 MG/ML SOLN COMPARISON:  12/20/2019 FINDINGS: Cardiovascular: No filling defects in the pulmonary arteries to suggest pulmonary emboli. Heart is normal size. Aortic and coronary artery calcifications. No aneurysm. Mediastinum/Nodes: Stable borderline sized right hilar lymph node. Scattered small and borderline sized mediastinal lymph nodes, stable. Trachea and esophagus are unremarkable. Thyroid unremarkable. Lungs/Pleura: Trace right pleural effusion. Masslike consolidation in the right lower lobe is again noted and is stable since prior study. New rounded masslike area of consolidation in the  right upper lobe measuring 2.5 cm. Increasing airspace disease posteriorly and peripherally in the right upper lobe and superior segment of the right lower lobe. Increasing ground-glass airspace opacity posteriorly in the superior segment of the left lower lobe. Paraseptal emphysema. Peripheral reticulation in the lungs bilaterally compatible with fibrosis. None none Upper Abdomen: Imaging into the upper abdomen demonstrates no acute findings. Musculoskeletal: Chest wall soft tissues are unremarkable. No acute bony abnormality. Review of the MIP images confirms the above findings. IMPRESSION: Rounded area of masslike consolidation in the right lower lobe with cavitation again noted, unchanged. New airspace disease seen in the right upper lobe and right lower lobe as well as superior segment of the left lower lobe. These areas are concerning for pneumonia. Areas of peripheral interstitial prominence and fibrosis bilaterally. Right hilar and borderline mediastinal lymph nodes are stable. Coronary artery disease. Aortic Atherosclerosis (ICD10-I70.0). Electronically Signed   By: Rolm Baptise M.D.   On: 02/07/2020 20:24   DG Chest Port 1 View  Result Date: 02/25/2020 CLINICAL DATA:  Shortness of breath  EXAM: PORTABLE CHEST 1 VIEW COMPARISON:  02/15/2020 FINDINGS: Patchy pulmonary infiltrates with subpleural and right apical predilection. No effusion or pneumothorax. Normal heart size. Aortic tortuosity. IMPRESSION: Stable pulmonary infiltrates. Electronically Signed   By: Monte Fantasia M.D.   On: 02/25/2020 05:44   DG Chest Port 1 View  Result Date: 02/15/2020 CLINICAL DATA:  Cough, worsening dyspnea. EXAM: PORTABLE CHEST 1 VIEW COMPARISON:  02/07/2020 FINDINGS: Improved aeration at the right lung base compared to the prior examination. Interstitial thickening and chronic changes at the left lung base are unchanged. Heart and mediastinum are grossly stable. IMPRESSION: 1. Improved aeration at the right lung  base compared to the recent comparison examination. 2. Chronic changes. Electronically Signed   By: Markus Daft M.D.   On: 02/15/2020 13:16   DG Chest Portable 1 View  Result Date: 02/07/2020 CLINICAL DATA:  Shortness of breath, cough, congestion EXAM: PORTABLE CHEST 1 VIEW COMPARISON:  02/03/2020 FINDINGS: Airspace disease in the right mid and lower lung again noted, most confluent at the right lung base compatible with pneumonia. Heart is normal size. Left lung clear. Previously seen patchy opacities at the left base have resolved. No effusions or acute bony abnormality. IMPRESSION: Continued patchy right mid and lower lung airspace opacities concerning for pneumonia. Clearing of the left basilar opacities since prior study. Electronically Signed   By: Rolm Baptise M.D.   On: 02/07/2020 18:51   VAS [place] LOWER EXTREMITY VENOUS (DVT)  Result Date: 02/24/2020  Lower Venous DVT Study Indications: Swelling.  Risk Factors: + Covid. Performing Technologist: Griffin Basil RCT RDMS  Examination Guidelines: A complete evaluation includes B-mode imaging, spectral Doppler, color Doppler, and power Doppler as needed of all accessible portions of each vessel. Bilateral testing is considered an integral part of a complete examination. Limited examinations for reoccurring indications may be performed as noted. The reflux portion of the exam is performed with the patient in reverse Trendelenburg.  +---------+---------------+---------+-----------+----------+--------------+ RIGHT    CompressibilityPhasicitySpontaneityPropertiesThrombus Aging +---------+---------------+---------+-----------+----------+--------------+ CFV      Full           Yes      Yes                                 +---------+---------------+---------+-----------+----------+--------------+ SFJ      Full                                                        +---------+---------------+---------+-----------+----------+--------------+ FV  Prox  Full                                                        +---------+---------------+---------+-----------+----------+--------------+ FV Mid   Full                                                        +---------+---------------+---------+-----------+----------+--------------+ FV DistalFull                                                        +---------+---------------+---------+-----------+----------+--------------+  PFV      Full                                                        +---------+---------------+---------+-----------+----------+--------------+ POP      Full           Yes      Yes                                 +---------+---------------+---------+-----------+----------+--------------+ PTV      Full                                                        +---------+---------------+---------+-----------+----------+--------------+ PERO     Full                                                        +---------+---------------+---------+-----------+----------+--------------+   +---------+---------------+---------+-----------+----------+--------------+ LEFT     CompressibilityPhasicitySpontaneityPropertiesThrombus Aging +---------+---------------+---------+-----------+----------+--------------+ CFV      Full           Yes      Yes                                 +---------+---------------+---------+-----------+----------+--------------+ SFJ      Full                                                        +---------+---------------+---------+-----------+----------+--------------+ FV Prox  Full                                                        +---------+---------------+---------+-----------+----------+--------------+ FV Mid   Full                                                        +---------+---------------+---------+-----------+----------+--------------+ FV DistalFull                                                         +---------+---------------+---------+-----------+----------+--------------+ PFV      Full                                                        +---------+---------------+---------+-----------+----------+--------------+  POP      Full           Yes      Yes                                 +---------+---------------+---------+-----------+----------+--------------+ PTV      Full                                                        +---------+---------------+---------+-----------+----------+--------------+ PERO     Full                                                        +---------+---------------+---------+-----------+----------+--------------+     Summary: RIGHT: - There is no evidence of deep vein thrombosis in the lower extremity.  - No cystic structure found in the popliteal fossa.  LEFT: - There is no evidence of deep vein thrombosis in the lower extremity.  - No cystic structure found in the popliteal fossa.  *See table(s) above for measurements and observations. Electronically signed by Monica Martinez MD on 02/24/2020 at 3:25:02 PM.    Final    VAS [place] LOWER EXTREMITY VENOUS (DVT)  Result Date: 02/10/2020  Lower Venous DVT Study Indications: Elevated Ddimer.  Risk Factors: COVID 19 positive. Comparison Study: No prior studies. Performing Technologist: Oliver Hum RVT  Examination Guidelines: A complete evaluation includes B-mode imaging, spectral Doppler, color Doppler, and power Doppler as needed of all accessible portions of each vessel. Bilateral testing is considered an integral part of a complete examination. Limited examinations for reoccurring indications may be performed as noted. The reflux portion of the exam is performed with the patient in reverse Trendelenburg.  +---------+---------------+---------+-----------+----------+--------------+ RIGHT    CompressibilityPhasicitySpontaneityPropertiesThrombus Aging  +---------+---------------+---------+-----------+----------+--------------+ CFV      Full           Yes      Yes                                 +---------+---------------+---------+-----------+----------+--------------+ SFJ      Full                                                        +---------+---------------+---------+-----------+----------+--------------+ FV Prox  Full                                                        +---------+---------------+---------+-----------+----------+--------------+ FV Mid   Full                                                        +---------+---------------+---------+-----------+----------+--------------+  FV DistalFull                                                        +---------+---------------+---------+-----------+----------+--------------+ PFV      Full                                                        +---------+---------------+---------+-----------+----------+--------------+ POP      Full           Yes      Yes                                 +---------+---------------+---------+-----------+----------+--------------+ PTV      Full                                                        +---------+---------------+---------+-----------+----------+--------------+ PERO     Full                                                        +---------+---------------+---------+-----------+----------+--------------+   +---------+---------------+---------+-----------+----------+--------------+ LEFT     CompressibilityPhasicitySpontaneityPropertiesThrombus Aging +---------+---------------+---------+-----------+----------+--------------+ CFV      Full           Yes      Yes                                 +---------+---------------+---------+-----------+----------+--------------+ SFJ      Full                                                         +---------+---------------+---------+-----------+----------+--------------+ FV Prox  Full                                                        +---------+---------------+---------+-----------+----------+--------------+ FV Mid   Full                                                        +---------+---------------+---------+-----------+----------+--------------+ FV DistalFull                                                        +---------+---------------+---------+-----------+----------+--------------+  PFV      Full                                                        +---------+---------------+---------+-----------+----------+--------------+ POP      Full           Yes      Yes                                 +---------+---------------+---------+-----------+----------+--------------+ PTV      Full                                                        +---------+---------------+---------+-----------+----------+--------------+ PERO     Full                                                        +---------+---------------+---------+-----------+----------+--------------+     Summary: RIGHT: - There is no evidence of deep vein thrombosis in the lower extremity.  - No cystic structure found in the popliteal fossa.  LEFT: - There is no evidence of deep vein thrombosis in the lower extremity.  - No cystic structure found in the popliteal fossa.  *See table(s) above for measurements and observations. Electronically signed by Ruta Hinds MD on 02/10/2020 at 6:25:28 PM.    Final    US Abdomen Limited RUQ (LIVER/GB)  Result Date: 02/15/2020 CLINICAL DATA:  Right upper quadrant abdominal pain. History of prior cholecystectomy. EXAM: ULTRASOUND ABDOMEN LIMITED RIGHT UPPER QUADRANT COMPARISON:  None. FINDINGS: Gallbladder: The gallbladder is surgically absent. Common bile duct: Diameter: 4.9 mm Liver: No focal lesion identified. Within normal limits in parenchymal  echogenicity. Portal vein is patent on color Doppler imaging with normal direction of blood flow towards the liver. Other: The study is limited secondary to overlying bowel gas. IMPRESSION: 1. Findings consistent with prior cholecystectomy. 2. Otherwise, unremarkable right upper quadrant ultrasound. Electronically Signed   By: Virgina Norfolk M.D.   On: 02/15/2020 21:02

## 2020-02-25 NOTE — Progress Notes (Signed)
   02/25/20 1227  Assess: MEWS Score  Temp 98 F (36.7 C)  BP 100/78  Pulse Rate (!) 110  ECG Heart Rate (!) 110  Resp (!) 23  Level of Consciousness Alert  SpO2 98 %  Assess: MEWS Score  MEWS Temp 0  MEWS Systolic 1  MEWS Pulse 1  MEWS RR 1  MEWS LOC 0  MEWS Score 3  MEWS Score Color Yellow  Assess: if the MEWS score is Yellow or Red  Were vital signs taken at a resting state? Yes  Focused Assessment No change from prior assessment  Early Detection of Sepsis Score *See Row Information* Low  MEWS guidelines implemented *See Row Information* Yes  Treat  MEWS Interventions Administered scheduled meds/treatments  Take Vital Signs  Increase Vital Sign Frequency  Yellow: Q 2hr X 2 then Q 4hr X 2, if remains yellow, continue Q 4hrs  Escalate  MEWS: Escalate Yellow: discuss with charge nurse/RN and consider discussing with provider and RRT  Notify: Charge Nurse/RN  Name of Charge Nurse/RN Notified Sarah RN  Date Charge Nurse/RN Notified 02/25/20  Time Charge Nurse/RN Notified 1242

## 2020-02-25 NOTE — Progress Notes (Signed)
Manufacturing engineer [facility]) Hospital Liaison: RN note     Contacted by patient's wife, [name], to request a hospital bed and W/C for patient prior to DC home. These items have been ordered by [facility].   Farrel Gordon, RN, [facility] (listed on AMION under [place])   [phone]

## 2020-02-26 LAB — CBC
HCT: 46.6 % (ref 39.0–52.0)
Hemoglobin: 15.2 g/dL (ref 13.0–17.0)
MCH: 32.9 pg (ref 26.0–34.0)
MCHC: 32.6 g/dL (ref 30.0–36.0)
MCV: 100.9 fL — ABNORMAL HIGH (ref 80.0–100.0)
Platelets: 248 10*3/uL (ref 150–400)
RBC: 4.62 MIL/uL (ref 4.22–5.81)
RDW: 13.3 % (ref 11.5–15.5)
WBC: 20 10*3/uL — ABNORMAL HIGH (ref 4.0–10.5)
nRBC: 0 % (ref 0.0–0.2)

## 2020-02-26 LAB — C-REACTIVE PROTEIN: CRP: 5.9 mg/dL — ABNORMAL HIGH (ref <1.0)

## 2020-02-26 LAB — BRAIN NATRIURETIC PEPTIDE: B Natriuretic Peptide: 179.7 pg/mL — ABNORMAL HIGH (ref 0.0–100.0)

## 2020-02-26 LAB — GLUCOSE, CAPILLARY
Glucose-Capillary: 117 mg/dL — ABNORMAL HIGH (ref 70–99)
Glucose-Capillary: 135 mg/dL — ABNORMAL HIGH (ref 70–99)

## 2020-02-26 LAB — PROCALCITONIN: Procalcitonin: <0.1 ng/mL

## 2020-02-26 LAB — D-DIMER, QUANTITATIVE: D-Dimer, Quant: 2.64 ug/mL-FEU — ABNORMAL HIGH (ref 0.00–0.50)

## 2020-02-26 MED ORDER — LORAZEPAM 2 MG/ML PO CONC
1.0000 mg | ORAL | Status: DC | PRN
  Administered 2020-02-27 – 2020-02-28 (×2): 1 mg via ORAL
  Filled 2020-02-26 (×2): qty 1

## 2020-02-26 NOTE — Progress Notes (Signed)
   02/26/20 0028  Assess: MEWS Score  Temp 98.2 F (36.8 C)  BP 100/86  Pulse Rate 97  ECG Heart Rate 93  Resp (!) 22  SpO2 93 %  Assess: MEWS Score  MEWS Temp 0  MEWS Systolic 1  MEWS Pulse 0  MEWS RR 1  MEWS LOC 0  MEWS Score 2  MEWS Score Color Yellow  Assess: if the MEWS score is Yellow or Red  Were vital signs taken at a resting state? No  Focused Assessment No change from prior assessment  Early Detection of Sepsis Score *See Row Information* Low  MEWS guidelines implemented *See Row Information* Yes  Treat  MEWS Interventions Other (Comment) (denies pain and was repositioned)  Take Vital Signs  Increase Vital Sign Frequency  Yellow: Q 2hr X 2 then Q 4hr X 2, if remains yellow, continue Q 4hrs   BP had earlier been reported to MD and metoprolol 25mg  held at 2200. MD aware

## 2020-02-26 NOTE — Progress Notes (Signed)
Spoke with pt's son, Travez Stancil, updated him on pt's plan of care and informed him that family can come in to see pt if they would like to.

## 2020-02-26 NOTE — TOC Progression Note (Signed)
Transition of Care [facility]) - Progression Note    Patient Details  Name: Melvin Foley MRN: [id number] Date of Birth: May 13, 1945  Transition of Care [facility]) CM/SW [place], LCSW Phone Number: 02/26/2020, 10:21 AM  Clinical Narrative:    CSW received request from MD for residential hospice placement. CSW made [facility], [facility], aware. She will send request to their MD for review and bed availability.    Expected Discharge Plan: [place] Barriers to Discharge: Continued Medical Work up  Expected Discharge Plan and Services Expected Discharge Plan: [place]   Discharge Planning Services: CM Consult Post Acute Care Choice: [place]: [place] ([facility]) Date [facility] Agency Contacted: 02/19/20 Time [place]: 1138 Representative spoke with at [place]: [place]   Social Determinants of Health ([place]) Interventions    Readmission Risk Interventions No flowsheet data found.

## 2020-02-26 NOTE — Progress Notes (Signed)
Engineer, maintenance [facility]) Hospital Liaison note.     This patient was previously referred to [facility] for home hospice services and approved. He has since been recommended for IPU at [facility]. His information has been forwarded to [facility] for review. Liaison spoke with his wife, [name]. [facility] will follow up on eligibility and room availability tomorrow 02/27/20.   A Please do not hesitate to call with questions.     Thank you,    Farrel Gordon, RN, [facility]       [place] (listed on AMION under [place])     [phone]

## 2020-02-26 NOTE — Progress Notes (Signed)
PROGRESS NOTE                                                                                                                                                                                                             Patient Demographics:    Melvin Foley, is a 74 y.o. male, DOB - February 24, 1946, YNW:[id number]  Outpatient Primary MD for the patient is Wallie Char, FNP   Admit date - 02/15/2020   LOS - 44  Chief Complaint  Patient presents with  . Covid Positive  . Pneumonia       Brief Narrative: Patient is a 74 y.o. male with PMHx of stage IIIb adenocarcinoma-finished radiation approximately 6 weeks back (follows at [place] [facility])-recently hospitalized from 10/22-10/25 COVID-19 pneumonia-did not require O2 on discharge-presented back to the ED on 10/30 with shortness of breath-found to have severe hypoxic respiratory failure due to ongoing COVID-19 pneumonitis.  COVID-19 vaccinated status: Unvaccinated   Significant Events: 10/18>> evaluated at [facility] for hypotension/possible pneumonia-refused admission-Covid 19 negative 10/22-10/25>> hospitalization for Covid-discharged on room air. 10/30>> readmitted with severe hypoxemia-due to ongoing COVID-19 pneumonitis.  Significant studies: 10/22>>Chest x-ray: Patchy right mid/lower lung opacities concerning for pneumonia 10/22>> CTA chest: New airspace disease in the right upper/right lower lobe/left lower lobe-rounded area of masslike consolidation in the right lower lobe with cavitation again noted. 10/31>> CTA chest: No PE, worsening peripheral groundglass opacities, subacute right sided rib fractures along the seventh through ninth ribs.  COVID-19 medications: Steroids: 10/22>> Remdesivir: 10/22>>10/26 Baricitinib: 10/31>>  Antibiotics: Vancomycin: 10/30>> 10/31 Cefepime: 10/30>> 10/31  Microbiology data: 10/22 >>blood culture: No growth 10/30>>  blood culture: No growth  Procedures: None  Consults: None  DVT prophylaxis: enoxaparin (LOVENOX) injection 40 mg Start: 02/15/20 2300    Subjective:   Patient in bed, is short of breath, denies any headache chest or abdominal pain.   Assessment  & Plan :   Acute hypoxic respiratory failure secondary to Covid 19 Viral pneumonia: Improving-Down to 2 L of oxygen at rest (at one point was on 12 L of HFNC).  With history of underlying lung malignancy which he has chosen not to get treatment for, he has been aggressively treated with IV steroids, baricitinib and remdesivir despite which his pulmonary status has been going down gradually, as per discussions with family he will be  transition to comfort measures with final goal of going to residential hospice, this will be done on 02/26/2020.  All noncomfort medications would be stopped.  He remains DNR.  Fever: afebrile  O2 requirements:  SpO2: 100 % O2 Flow Rate (L/min): 15 L/min (n15L NR)   Prone/Incentive Spirometry: encouraged  incentive spirometry use 3-4/hour.   Recent Labs  Lab 02/20/20 0304 02/20/20 0304 02/21/20 0747 02/21/20 0747 02/22/20 0401 02/23/20 0302 02/24/20 0405 02/25/20 0134 02/26/20 0151 02/26/20 0444  WBC 11.5*   < > 10.3   < > 11.3* 10.5 10.1 8.8 20.0*  --   HGB 12.7*   < > 13.2   < > 14.1 13.1 12.9* 13.2 15.2  --   HCT 37.3*   < > 39.3   < > 41.9 39.1 39.1 39.7 46.6  --   PLT 197   < > 206   < > 245 227 221 221 248  --   CRP 1.1*   < > 0.7   < > 0.6 0.6 0.7 0.6 5.9*  --   BNP  --   --   --   --   --   --  92.9 114.0*  --  179.7*  DDIMER 1.51*   < > 1.25*   < > 1.86* 1.98* 1.85* 2.00* 2.64*  --   PROCALCITON  --   --   --   --   --   --  <0.10 <0.10 <0.10  --   AST 27  --  22  --  22 23 27   --   --   --   ALT 18  --  19  --  22 21 26   --   --   --   ALKPHOS 62  --  61  --  72 63 70  --   --   --   BILITOT 0.6  --  0.8  --  1.1 1.2 0.7  --   --   --   ALBUMIN 2.5*  --  2.5*  --  2.7* 2.5* 2.5*  --    --   --    < > = values in this interval not displayed.      Epistaxis-mild-resolved-treated with Afrin nasal spray.  Continue monitoring.  Elevated D-dimer.  Negative leg ultrasound.  COPD: No evidence of flare-continue bronchodilators  Chronic diastolic heart failure: Euvolemic.  Hypotension.  Resolved after IVF.  History of prior CVA/carotid endarterectomy: Continue Plavix.  Stage IIIb adenocarcinoma of the lung: Claims recently completed RTX at [facility] 6 weeks back-before that he was on chemo at the [facility] hospital.  CT chest continues to show right lower lobe mass.  Follow with oncology post discharge. However per spouse on 11/2-patient has made up his mind not to pursue any further treatment for this cancer.  Patient and spouse wish palliative care which has been involved, they eventually want home hospice which will be arranged.  Incidental finding of right rib fractures: Supportive care.  Does not have significant pain-apparently did fall off his bed (soft fall per wife) approximately a week back-per spouse-she was told that he is going to have weak ribs due to radiation.  Schizophrenia: Appears stable-continue thiothixene   Debility/deconditioning: Suspect has significant amount of debility/deconditioning at baseline but has more worsening due to acute illness-PT/OT eval completed-home health recommended-have ordered.   Palliative care/goals of care discussion: He has stage IIIb lung cancer for which him and his wife decided to pursue home  hospice.  Will initiate comfort measures here if he declines further.  Discussed with family bedside on 02/25/2020 and again with wife on 02/26/2020, transition to comfort measures and distention hospice.      Condition - Guarded  Family Communication  : Spouse - Ruth-[phone]-on 02/23/20 02/24/2020, DNR and discharged with home hospice in a few days. Updated 02/25/20 agrees to pursue comfort measures if he declines further  here.  Updated 02/26/2020, comfort measures, residential hospice.  Code Status :  DNR  Diet :  Diet Order            DIET DYS 3 Room service appropriate? Yes; Fluid consistency: Thin  Diet effective now                  Disposition Plan  :   Status is: Inpatient  Remains inpatient appropriate because:Inpatient level of care appropriate due to severity of illness  Dispo: The patient is from: Home              Anticipated d/c is to: Residential hospice              Anticipated d/c date is: now              Patient currently is not medically stable to d/c.   Barriers to discharge: Hypoxia requiring O2 supplementation  Antimicorbials  :    Anti-infectives (From admission, onward)   Start     Dose/Rate Route Frequency Ordered Stop   02/16/20 1000  ceFEPIme (MAXIPIME) 2 g in sodium chloride 0.9 % 100 mL IVPB  Status:  Discontinued        2 g 200 mL/hr over 30 Minutes Intravenous Every 12 hours 02/15/20 1951 02/16/20 1105   02/16/20 0800  vancomycin (VANCOREADY) IVPB 500 mg/100 mL  Status:  Discontinued        500 mg 100 mL/hr over 60 Minutes Intravenous Every 12 hours 02/15/20 1844 02/16/20 1105   02/15/20 1845  ceFEPIme (MAXIPIME) 2 g in sodium chloride 0.9 % 100 mL IVPB        2 g 200 mL/hr over 30 Minutes Intravenous  Once 02/15/20 1839 02/15/20 1949   02/15/20 1845  vancomycin (VANCOREADY) IVPB 1500 mg/300 mL        1,500 mg 150 mL/hr over 120 Minutes Intravenous  Once 02/15/20 1844 02/15/20 2152      Inpatient Medications  Scheduled Meds: . benzonatate  200 mg Oral TID  . clopidogrel  75 mg Oral QHS  . enoxaparin (LOVENOX) injection  40 mg Subcutaneous Q24H  . fluticasone  2 spray Each Nare Daily  . fluticasone furoate-vilanterol  1 puff Inhalation Daily  . guaiFENesin  600 mg Oral BID  . mouth rinse  15 mL Mouth Rinse BID  . oxymetazoline  1 spray Each Nare BID  . pantoprazole  40 mg Oral Daily  . sodium chloride flush  3 mL Intravenous Q12H  . thiothixene   15 mg Oral QHS   Continuous Infusions:  PRN Meds:.acetaminophen, albuterol, alum & mag hydroxide-simeth, chlorpheniramine-HYDROcodone, LORazepam, morphine injection, [DISCONTINUED] ondansetron **OR** ondansetron (ZOFRAN) IV, sodium chloride   Time Spent in minutes  25  See all Orders from today for further details   Lala Lund M.D on 02/26/2020 at 10:19 AM  To page go to [email or web address] - use universal password  [facility] -  Office  [phone]    Objective:   Vitals:   02/26/20 0200 02/26/20 0221 02/26/20 0507 02/26/20 0830  BP: 133/65 100/75 Marland Kitchen)  88/75 (!) 86/70  Pulse: (!) 113 (!) 108 (!) 102 97  Resp: (!) 21 (!) 23 20 10   Temp: 97.9 F (36.6 C) 99.9 F (37.7 C) 98.5 F (36.9 C) 98.4 F (36.9 C)  TempSrc:  Oral Oral Oral  SpO2: 92% 96% 100% 100%  Weight:      Height:        Wt Readings from Last 3 Encounters:  02/16/20 67 kg  02/08/20 67.1 kg  02/03/20 69.7 kg     Intake/Output Summary (Last 24 hours) at 02/26/2020 1019 Last data filed at 02/26/2020 0630 Gross per 24 hour  Intake 180 ml  Output 1250 ml  Net -1070 ml     Physical Exam  Awake Alert, No new F.N deficits,  [place].AT,PERRAL Supple Neck,No JVD, No cervical lymphadenopathy appriciated.  Symmetrical Chest wall movement, Good air movement bilaterally, +ve rales RRR,No Gallops, Rubs or new Murmurs, No Parasternal Heave +ve B.Sounds, Abd Soft, No tenderness, No organomegaly appriciated, No rebound - guarding or rigidity. No Cyanosis, Clubbing or edema, No new Rash or bruise    Data Review:    CBC Recent Labs  Lab 02/20/20 0304 02/21/20 0747 02/22/20 0401 02/23/20 0302 02/24/20 0405 02/25/20 0134 02/26/20 0151  WBC 11.5*   < > 11.3* 10.5 10.1 8.8 20.0*  HGB 12.7*   < > 14.1 13.1 12.9* 13.2 15.2  HCT 37.3*   < > 41.9 39.1 39.1 39.7 46.6  PLT 197   < > 245 227 221 221 248  MCV 97.9   < > 98.6 99.7 100.3* 99.3 100.9*  MCH 33.3   < > 33.2 33.4 33.1 33.0 32.9  MCHC 34.0   <  > 33.7 33.5 33.0 33.2 32.6  RDW 13.6   < > 13.6 13.6 13.5 13.3 13.3  LYMPHSABS 0.2*  --   --   --   --   --   --   MONOABS 0.4  --   --   --   --   --   --   EOSABS 0.0  --   --   --   --   --   --   BASOSABS 0.0  --   --   --   --   --   --    < > = values in this interval not displayed.    Chemistries  Recent Labs  Lab 02/20/20 0304 02/21/20 0747 02/22/20 0401 02/23/20 0302 02/24/20 0405  NA 133* 139 140 139 139  K 4.2 4.2 4.1 3.8 4.0  CL 98 101 97* 97* 100  CO2 25 26 29  32 32  GLUCOSE 156* 129* 161* 120* 116*  BUN 33* 37* 50* 48* 42*  CREATININE 1.12 0.99 1.08 1.16 1.33*  CALCIUM 8.6* 8.8* 9.0 8.6* 8.6*  MG 2.0  --   --   --   --   AST 27 22 22 23 27   ALT 18 19 22 21 26   ALKPHOS 62 61 72 63 70  BILITOT 0.6 0.8 1.1 1.2 0.7   ------------------------------------------------------------------------------------------------------------------ No results for input(s): CHOL, HDL, LDLCALC, TRIG, CHOLHDL, LDLDIRECT in the last 72 hours.  Lab Results  Component Value Date   HGBA1C 5.1 05/01/2017   ------------------------------------------------------------------------------------------------------------------ No results for input(s): TSH, T4TOTAL, T3FREE, THYROIDAB in the last 72 hours.  Invalid input(s): FREET3 ------------------------------------------------------------------------------------------------------------------ No results for input(s): VITAMINB12, FOLATE, FERRITIN, TIBC, IRON, RETICCTPCT in the last 72 hours.  Coagulation profile No results for input(s): INR, PROTIME in the last 168  hours.  Recent Labs    02/25/20 0134 02/26/20 0151  DDIMER 2.00* 2.64*    Cardiac Enzymes No results for input(s): CKMB, TROPONINI, MYOGLOBIN in the last 168 hours.  Invalid input(s): CK ------------------------------------------------------------------------------------------------------------------    Component Value Date/Time   BNP 179.7 (H) 02/26/2020 0444     Micro Results No results found for this or any previous visit (from the past 240 hour(s)).  Radiology Reports DG Chest 2 View  Result Date: 02/03/2020 CLINICAL DATA:  Hypotension, shortness of breath EXAM: CHEST - 2 VIEW COMPARISON:  12/20/2019 FINDINGS: Patchy airspace disease in the lower lobes concerning for pneumonia. Heart is normal size. No effusions or pneumothorax. No acute bony abnormality. IMPRESSION: Patchy bilateral lower lobe airspace opacities concerning for pneumonia. Electronically Signed   By: Rolm Baptise M.D.   On: 02/03/2020 17:42   CT Angio Chest PE W and/or Wo Contrast  Result Date: 02/15/2020 CLINICAL DATA:  Worsening shortness of breath. EXAM: CT ANGIOGRAPHY CHEST WITH CONTRAST TECHNIQUE: Multidetector CT imaging of the chest was performed using the standard protocol during bolus administration of intravenous contrast. Multiplanar CT image reconstructions and MIPs were obtained to evaluate the vascular anatomy. CONTRAST:  34mL OMNIPAQUE IOHEXOL 350 MG/ML SOLN COMPARISON:  CT dated 02/07/2020 FINDINGS: Cardiovascular: Contrast injection is sufficient to demonstrate satisfactory opacification of the pulmonary arteries to the segmental level. There is no pulmonary embolus or evidence of right heart strain. The size of the main pulmonary artery is normal. Normal heart size with coronary artery calcification. The course and caliber of the aorta are normal. There is mild atherosclerotic calcification. Opacification decreased due to pulmonary arterial phase contrast bolus timing. Mediastinum/Nodes: -- No mediastinal lymphadenopathy. -- No hilar lymphadenopathy. -- No axillary lymphadenopathy. -- No supraclavicular lymphadenopathy. -- Normal thyroid gland where visualized. -  Unremarkable esophagus. Lungs/Pleura: Again noted is a cavitary mass in the right lower lobe similar to prior study. There are persistent ground-glass airspace opacities in the right upper lobe which are  essentially stable from prior study. There are worsening peripheral ground-glass airspace opacities in the left lower and left upper lobe. There is no pneumothorax. No large pleural effusion. Upper Abdomen: Contrast bolus timing is not optimized for evaluation of the abdominal organs. The visualized portions of the organs of the upper abdomen are normal. Musculoskeletal: There are subacute right-sided rib fractures involving the seventh through ninth ribs. Review of the MIP images confirms the above findings. IMPRESSION: 1. No evidence for acute pulmonary embolus. 2. Worsening peripheral ground-glass airspace opacities in the left lower and left upper lobe, concerning for pneumonia (viral or bacterial) persistent cavitary mass in the right lower lobe. 3. Subacute right-sided rib fractures involving the seventh through ninth ribs. Aortic Atherosclerosis (ICD10-I70.0). Electronically Signed   By: Constance Holster M.D.   On: 02/15/2020 17:36   CT Angio Chest PE W and/or Wo Contrast  Result Date: 02/07/2020 CLINICAL DATA:  Positive D-dimer, shortness of breath, tachycardia. EXAM: CT ANGIOGRAPHY CHEST WITH CONTRAST TECHNIQUE: Multidetector CT imaging of the chest was performed using the standard protocol during bolus administration of intravenous contrast. Multiplanar CT image reconstructions and MIPs were obtained to evaluate the vascular anatomy. CONTRAST:  115mL OMNIPAQUE IOHEXOL 350 MG/ML SOLN COMPARISON:  12/20/2019 FINDINGS: Cardiovascular: No filling defects in the pulmonary arteries to suggest pulmonary emboli. Heart is normal size. Aortic and coronary artery calcifications. No aneurysm. Mediastinum/Nodes: Stable borderline sized right hilar lymph node. Scattered small and borderline sized mediastinal lymph nodes, stable. Trachea and esophagus are unremarkable. Thyroid  unremarkable. Lungs/Pleura: Trace right pleural effusion. Masslike consolidation in the right lower lobe is again noted and is stable since  prior study. New rounded masslike area of consolidation in the right upper lobe measuring 2.5 cm. Increasing airspace disease posteriorly and peripherally in the right upper lobe and superior segment of the right lower lobe. Increasing ground-glass airspace opacity posteriorly in the superior segment of the left lower lobe. Paraseptal emphysema. Peripheral reticulation in the lungs bilaterally compatible with fibrosis. None none Upper Abdomen: Imaging into the upper abdomen demonstrates no acute findings. Musculoskeletal: Chest wall soft tissues are unremarkable. No acute bony abnormality. Review of the MIP images confirms the above findings. IMPRESSION: Rounded area of masslike consolidation in the right lower lobe with cavitation again noted, unchanged. New airspace disease seen in the right upper lobe and right lower lobe as well as superior segment of the left lower lobe. These areas are concerning for pneumonia. Areas of peripheral interstitial prominence and fibrosis bilaterally. Right hilar and borderline mediastinal lymph nodes are stable. Coronary artery disease. Aortic Atherosclerosis (ICD10-I70.0). Electronically Signed   By: Rolm Baptise M.D.   On: 02/07/2020 20:24   DG Chest Port 1 View  Result Date: 02/25/2020 CLINICAL DATA:  Shortness of breath EXAM: PORTABLE CHEST 1 VIEW COMPARISON:  02/15/2020 FINDINGS: Patchy pulmonary infiltrates with subpleural and right apical predilection. No effusion or pneumothorax. Normal heart size. Aortic tortuosity. IMPRESSION: Stable pulmonary infiltrates. Electronically Signed   By: Monte Fantasia M.D.   On: 02/25/2020 05:44   DG Chest Port 1 View  Result Date: 02/15/2020 CLINICAL DATA:  Cough, worsening dyspnea. EXAM: PORTABLE CHEST 1 VIEW COMPARISON:  02/07/2020 FINDINGS: Improved aeration at the right lung base compared to the prior examination. Interstitial thickening and chronic changes at the left lung base are unchanged. Heart and mediastinum are  grossly stable. IMPRESSION: 1. Improved aeration at the right lung base compared to the recent comparison examination. 2. Chronic changes. Electronically Signed   By: Markus Daft M.D.   On: 02/15/2020 13:16   DG Chest Portable 1 View  Result Date: 02/07/2020 CLINICAL DATA:  Shortness of breath, cough, congestion EXAM: PORTABLE CHEST 1 VIEW COMPARISON:  02/03/2020 FINDINGS: Airspace disease in the right mid and lower lung again noted, most confluent at the right lung base compatible with pneumonia. Heart is normal size. Left lung clear. Previously seen patchy opacities at the left base have resolved. No effusions or acute bony abnormality. IMPRESSION: Continued patchy right mid and lower lung airspace opacities concerning for pneumonia. Clearing of the left basilar opacities since prior study. Electronically Signed   By: Rolm Baptise M.D.   On: 02/07/2020 18:51   VAS [place] LOWER EXTREMITY VENOUS (DVT)  Result Date: 02/24/2020  Lower Venous DVT Study Indications: Swelling.  Risk Factors: + Covid. Performing Technologist: Griffin Basil RCT RDMS  Examination Guidelines: A complete evaluation includes B-mode imaging, spectral Doppler, color Doppler, and power Doppler as needed of all accessible portions of each vessel. Bilateral testing is considered an integral part of a complete examination. Limited examinations for reoccurring indications may be performed as noted. The reflux portion of the exam is performed with the patient in reverse Trendelenburg.  +---------+---------------+---------+-----------+----------+--------------+ RIGHT    CompressibilityPhasicitySpontaneityPropertiesThrombus Aging +---------+---------------+---------+-----------+----------+--------------+ CFV      Full           Yes      Yes                                 +---------+---------------+---------+-----------+----------+--------------+  SFJ      Full                                                         +---------+---------------+---------+-----------+----------+--------------+ FV Prox  Full                                                        +---------+---------------+---------+-----------+----------+--------------+ FV Mid   Full                                                        +---------+---------------+---------+-----------+----------+--------------+ FV DistalFull                                                        +---------+---------------+---------+-----------+----------+--------------+ PFV      Full                                                        +---------+---------------+---------+-----------+----------+--------------+ POP      Full           Yes      Yes                                 +---------+---------------+---------+-----------+----------+--------------+ PTV      Full                                                        +---------+---------------+---------+-----------+----------+--------------+ PERO     Full                                                        +---------+---------------+---------+-----------+----------+--------------+   +---------+---------------+---------+-----------+----------+--------------+ LEFT     CompressibilityPhasicitySpontaneityPropertiesThrombus Aging +---------+---------------+---------+-----------+----------+--------------+ CFV      Full           Yes      Yes                                 +---------+---------------+---------+-----------+----------+--------------+ SFJ      Full                                                        +---------+---------------+---------+-----------+----------+--------------+  FV Prox  Full                                                        +---------+---------------+---------+-----------+----------+--------------+ FV Mid   Full                                                         +---------+---------------+---------+-----------+----------+--------------+ FV DistalFull                                                        +---------+---------------+---------+-----------+----------+--------------+ PFV      Full                                                        +---------+---------------+---------+-----------+----------+--------------+ POP      Full           Yes      Yes                                 +---------+---------------+---------+-----------+----------+--------------+ PTV      Full                                                        +---------+---------------+---------+-----------+----------+--------------+ PERO     Full                                                        +---------+---------------+---------+-----------+----------+--------------+     Summary: RIGHT: - There is no evidence of deep vein thrombosis in the lower extremity.  - No cystic structure found in the popliteal fossa.  LEFT: - There is no evidence of deep vein thrombosis in the lower extremity.  - No cystic structure found in the popliteal fossa.  *See table(s) above for measurements and observations. Electronically signed by Monica Martinez MD on 02/24/2020 at 3:25:02 PM.    Final    VAS [place] LOWER EXTREMITY VENOUS (DVT)  Result Date: 02/10/2020  Lower Venous DVT Study Indications: Elevated Ddimer.  Risk Factors: COVID 19 positive. Comparison Study: No prior studies. Performing Technologist: Oliver Hum RVT  Examination Guidelines: A complete evaluation includes B-mode imaging, spectral Doppler, color Doppler, and power Doppler as needed of all accessible portions of each vessel. Bilateral testing is considered an integral part of a complete examination. Limited examinations for reoccurring indications may be performed as noted. The reflux portion of the exam is performed with the patient in reverse Trendelenburg.   +---------+---------------+---------+-----------+----------+--------------+  RIGHT    CompressibilityPhasicitySpontaneityPropertiesThrombus Aging +---------+---------------+---------+-----------+----------+--------------+ CFV      Full           Yes      Yes                                 +---------+---------------+---------+-----------+----------+--------------+ SFJ      Full                                                        +---------+---------------+---------+-----------+----------+--------------+ FV Prox  Full                                                        +---------+---------------+---------+-----------+----------+--------------+ FV Mid   Full                                                        +---------+---------------+---------+-----------+----------+--------------+ FV DistalFull                                                        +---------+---------------+---------+-----------+----------+--------------+ PFV      Full                                                        +---------+---------------+---------+-----------+----------+--------------+ POP      Full           Yes      Yes                                 +---------+---------------+---------+-----------+----------+--------------+ PTV      Full                                                        +---------+---------------+---------+-----------+----------+--------------+ PERO     Full                                                        +---------+---------------+---------+-----------+----------+--------------+   +---------+---------------+---------+-----------+----------+--------------+ LEFT     CompressibilityPhasicitySpontaneityPropertiesThrombus Aging +---------+---------------+---------+-----------+----------+--------------+ CFV      Full           Yes      Yes                                  +---------+---------------+---------+-----------+----------+--------------+  SFJ      Full                                                        +---------+---------------+---------+-----------+----------+--------------+ FV Prox  Full                                                        +---------+---------------+---------+-----------+----------+--------------+ FV Mid   Full                                                        +---------+---------------+---------+-----------+----------+--------------+ FV DistalFull                                                        +---------+---------------+---------+-----------+----------+--------------+ PFV      Full                                                        +---------+---------------+---------+-----------+----------+--------------+ POP      Full           Yes      Yes                                 +---------+---------------+---------+-----------+----------+--------------+ PTV      Full                                                        +---------+---------------+---------+-----------+----------+--------------+ PERO     Full                                                        +---------+---------------+---------+-----------+----------+--------------+     Summary: RIGHT: - There is no evidence of deep vein thrombosis in the lower extremity.  - No cystic structure found in the popliteal fossa.  LEFT: - There is no evidence of deep vein thrombosis in the lower extremity.  - No cystic structure found in the popliteal fossa.  *See table(s) above for measurements and observations. Electronically signed by Ruta Hinds MD on 02/10/2020 at 6:25:28 PM.    Final    US Abdomen Limited RUQ (LIVER/GB)  Result Date: 02/15/2020 CLINICAL DATA:  Right upper quadrant abdominal pain. History of prior cholecystectomy. EXAM: ULTRASOUND ABDOMEN LIMITED RIGHT UPPER QUADRANT COMPARISON:  None. FINDINGS:  Gallbladder: The gallbladder is surgically absent. Common bile duct: Diameter: 4.9 mm Liver: No focal lesion identified. Within normal limits in parenchymal echogenicity. Portal vein is patent on color Doppler imaging with normal direction of blood flow towards the liver. Other: The study is limited secondary to overlying bowel gas. IMPRESSION: 1. Findings consistent with prior cholecystectomy. 2. Otherwise, unremarkable right upper quadrant ultrasound. Electronically Signed   By: Virgina Norfolk M.D.   On: 02/15/2020 21:02

## 2020-02-27 NOTE — Progress Notes (Signed)
Occupational Therapy Discharge  Patient Details Name: Melvin Foley MRN: [id number] DOB: Dec 21, 1945   Cancelled Treatment:      Patient discharged from OT services secondary to patient has now transitioned to comfort care and is awaiting residential Hospice. For current functional see level prior therapy progress note. Will sign off.   Ward, OTR/L Acute Rehab Pager: [phone] Office: [phone] 02/27/2020, 4:08 PM

## 2020-02-27 NOTE — TOC Progression Note (Addendum)
Transition of Care [facility]) - Progression Note    Patient Details  Name: Melvin Foley MRN: [id number] Date of Birth: 12-27-1945  Transition of Care [facility]) CM/SW [place], LCSW Phone Number: 02/27/2020, 11:02 AM  Clinical Narrative:    11am-CSW received call from patient's daughter, Melvin Foley [phone]). She requested CSW refer patient to another hospice agency to see if they have beds available as she is hoping to get him somewhere soon to be more comfortable. CSW sent referral to [facility] for review.   No beds available at [facility] yet.   2pm-[facility] patient tomorrow. CSW updated patient's daughter, Melvin Foley. She is accepting of whichever facility has a bed first.    Expected Discharge Plan: [place] Barriers to Discharge: Continued Medical Work up  Expected Discharge Plan and Services Expected Discharge Plan: [place]   Discharge Planning Services: CM Consult Post Acute Care Choice: [place]: [place] ([facility]) Date [facility] Contacted: 02/19/20 Time [place]: 1138 Representative spoke with at [place]: Organ   Social Determinants of Health ([place]) Interventions    Readmission Risk Interventions No flowsheet data found.

## 2020-02-27 NOTE — Progress Notes (Signed)
Manufacturing engineer [facility]) Hospital Liaison note.    [place] is unable to offer a room today. Hospital Liaison will follow up tomorrow or sooner if a room becomes available.  Please do not hesitate to call with questions.    Thank you for the opportunity to participate in this patient's care.  Chrislyn Edison Pace, BSN, RN [place] (listed on [place] under Hospice[facility])    [phone]

## 2020-02-27 NOTE — Care Management Important Message (Signed)
Important Message  Patient Details  Name: Melvin Foley MRN: [id number] Date of Birth: 10/15/1945   Medicare Important Message Given:  Yes - Important Message mailed due to current National Emergency  Verbal consent obtained due to current National Emergency  Relationship to patient: Self Contact Name: Kahlil Cowans Call Date: 02/27/20  Time: 1343 Phone: [phone] Outcome: No Answer/Busy Important Message mailed to: Patient address on file    Delorse Lek 02/27/2020, 1:43 PM

## 2020-02-27 NOTE — Progress Notes (Signed)
Physical Therapy Discharge Patient Details Name: Melvin Foley MRN: [id number] DOB: 1946-04-14 Today's Date: 02/27/2020 Time:  -     Patient discharged from PT services secondary to patient has now transitioned to comfort care and is awaiting residential Hospice. .  Please see latest therapy progress note for current level of functioning and progress toward goals.    Progress and discharge plan discussed with patient and/or caregiver: based on MD's discussion with pt/family and pt's medical decline  GP     Arby Barrette, PT Pager [phone]   Rexanne Mano 02/27/2020, 1:34 PM

## 2020-02-27 NOTE — Progress Notes (Signed)
PROGRESS NOTE                                                                                                                                                                                                             Patient Demographics:    Melvin Foley, is a 74 y.o. male, DOB - Oct 01, 1945, WJX:[id number]  Outpatient Primary MD for the patient is Wallie Char, FNP   Admit date - 02/15/2020   LOS - 12  Chief Complaint  Patient presents with  . Covid Positive  . Pneumonia       Brief Narrative: Patient is a 74 y.o. male with PMHx of stage IIIb adenocarcinoma-finished radiation approximately 6 weeks back (follows at [place] system)-recently hospitalized from 10/22-10/25 COVID-19 pneumonia-did not require O2 on discharge-presented back to the ED on 10/30 with shortness of breath-found to have severe hypoxic respiratory failure due to ongoing COVID-19 pneumonitis.  COVID-19 vaccinated status: Unvaccinated   Significant Events: 10/18>> evaluated at [facility] for hypotension/possible pneumonia-refused admission-Covid 19 negative 10/22-10/25>> hospitalization for Covid-discharged on room air. 10/30>> readmitted with severe hypoxemia-due to ongoing COVID-19 pneumonitis.  Significant studies: 10/22>>Chest x-ray: Patchy right mid/lower lung opacities concerning for pneumonia 10/22>> CTA chest: New airspace disease in the right upper/right lower lobe/left lower lobe-rounded area of masslike consolidation in the right lower lobe with cavitation again noted. 10/31>> CTA chest: No PE, worsening peripheral groundglass opacities, subacute right sided rib fractures along the seventh through ninth ribs.  COVID-19 medications: Steroids: 10/22>> Remdesivir: 10/22>>10/26 Baricitinib: 10/31>>  Antibiotics: Vancomycin: 10/30>> 10/31 Cefepime: 10/30>> 10/31  Microbiology data: 10/22 >>blood culture: No growth 10/30>>  blood culture: No growth  Procedures: None  Consults: None  DVT prophylaxis: enoxaparin (LOVENOX) injection 40 mg Start: 02/15/20 2300    Subjective:   Patient in bed, appears to be in no distress, denies any headache chest or abdominal pain.   Assessment  & Plan :   Acute hypoxic respiratory failure secondary to Covid 19 Viral pneumonia: Improving-Down to 2 L of oxygen at rest (at one point was on 12 L of HFNC).  With history of underlying lung malignancy which he has chosen not to get treatment for, he has been aggressively treated with IV steroids, baricitinib and remdesivir despite which his pulmonary status has been going down gradually, as per discussions with family heremains  DNR now transition to full comfort measures.  Residential hospice awaited, quarantine ends on 02/28/2020.  All noncomfort meds will be stopped   O2 requirements:  SpO2: 97 % O2 Flow Rate (L/min): 15 L/min    Recent Labs  Lab 02/21/20 0747 02/21/20 0747 02/22/20 0401 02/23/20 0302 02/24/20 0405 02/25/20 0134 02/26/20 0151 02/26/20 0444  WBC 10.3   < > 11.3* 10.5 10.1 8.8 20.0*  --   HGB 13.2   < > 14.1 13.1 12.9* 13.2 15.2  --   HCT 39.3   < > 41.9 39.1 39.1 39.7 46.6  --   PLT 206   < > 245 227 221 221 248  --   CRP 0.7   < > 0.6 0.6 0.7 0.6 5.9*  --   BNP  --   --   --   --  92.9 114.0*  --  179.7*  DDIMER 1.25*   < > 1.86* 1.98* 1.85* 2.00* 2.64*  --   PROCALCITON  --   --   --   --  <0.10 <0.10 <0.10  --   AST 22  --  22 23 27   --   --   --   ALT 19  --  22 21 26   --   --   --   ALKPHOS 61  --  72 63 70  --   --   --   BILITOT 0.8  --  1.1 1.2 0.7  --   --   --   ALBUMIN 2.5*  --  2.7* 2.5* 2.5*  --   --   --    < > = values in this interval not displayed.      Other medical issues addressed earlier this admission during this hospitalization prior to him being transition to comfort measures are below.    Epistaxis-mild-resolved-treated with Afrin nasal spray.  Continue  monitoring.  Elevated D-dimer.  Negative leg ultrasound.  COPD: No evidence of flare-continue bronchodilators  Chronic diastolic heart failure: Euvolemic.  Hypotension.  Resolved after IVF.  History of prior CVA/carotid endarterectomy: Continue Plavix.  Stage IIIb adenocarcinoma of the lung: Claims recently completed RTX at [facility] 6 weeks back-before that he was on chemo at the [facility].  CT chest continues to show right lower lobe mass.  Follow with oncology post discharge. However per spouse on 11/2-patient has made up his mind not to pursue any further treatment for this cancer.  Patient and spouse wish palliative care which has been involved, they eventually want home hospice which will be arranged.  Incidental finding of right rib fractures: Supportive care.  Does not have significant pain-apparently did fall off his bed (soft fall per wife) approximately a week back-per spouse-she was told that he is going to have weak ribs due to radiation.  Schizophrenia: Appears stable-continue thiothixene   Debility/deconditioning: Suspect has significant amount of debility/deconditioning at baseline but has more worsening due to acute illness-PT/OT eval completed-home health recommended-have ordered.   Palliative care/goals of care discussion: He has stage IIIb lung cancer for which him and his wife decided to pursue home hospice.  Will initiate comfort measures here if he declines further.  Discussed with family bedside on 02/25/2020 and again with wife on 02/26/2020, transition to comfort measures and distention hospice.      Condition - Guarded  Family Communication  : Spouse - [phone]-on 02/23/20 02/24/2020, DNR and discharged with home hospice in a few days. Updated 02/25/20 agrees to  pursue comfort measures if he declines further here.  Updated 02/26/2020, comfort measures, residential hospice.  Code Status :  DNR  Diet :  Diet Order            DIET DYS 3  Room service appropriate? Yes; Fluid consistency: Thin  Diet effective now                  Disposition Plan  :   Status is: Inpatient  Remains inpatient appropriate because:Inpatient level of care appropriate due to severity of illness  Dispo: The patient is from: Home              Anticipated d/c is to: Residential hospice              Anticipated d/c date is: now              Patient currently is not medically stable to d/c.   Barriers to discharge: Hypoxia requiring O2 supplementation  Antimicorbials  :    Anti-infectives (From admission, onward)   Start     Dose/Rate Route Frequency Ordered Stop   02/16/20 1000  ceFEPIme (MAXIPIME) 2 g in sodium chloride 0.9 % 100 mL IVPB  Status:  Discontinued        2 g 200 mL/hr over 30 Minutes Intravenous Every 12 hours 02/15/20 1951 02/16/20 1105   02/16/20 0800  vancomycin (VANCOREADY) IVPB 500 mg/100 mL  Status:  Discontinued        500 mg 100 mL/hr over 60 Minutes Intravenous Every 12 hours 02/15/20 1844 02/16/20 1105   02/15/20 1845  ceFEPIme (MAXIPIME) 2 g in sodium chloride 0.9 % 100 mL IVPB        2 g 200 mL/hr over 30 Minutes Intravenous  Once 02/15/20 1839 02/15/20 1949   02/15/20 1845  vancomycin (VANCOREADY) IVPB 1500 mg/300 mL        1,500 mg 150 mL/hr over 120 Minutes Intravenous  Once 02/15/20 1844 02/15/20 2152      Inpatient Medications  Scheduled Meds: . benzonatate  200 mg Oral TID  . clopidogrel  75 mg Oral QHS  . enoxaparin (LOVENOX) injection  40 mg Subcutaneous Q24H  . fluticasone  2 spray Each Nare Daily  . fluticasone furoate-vilanterol  1 puff Inhalation Daily  . guaiFENesin  600 mg Oral BID  . mouth rinse  15 mL Mouth Rinse BID  . oxymetazoline  1 spray Each Nare BID  . pantoprazole  40 mg Oral Daily  . sodium chloride flush  3 mL Intravenous Q12H  . thiothixene  15 mg Oral QHS   Continuous Infusions:  PRN Meds:.acetaminophen, albuterol, alum & mag hydroxide-simeth,  chlorpheniramine-HYDROcodone, LORazepam, morphine injection, [DISCONTINUED] ondansetron **OR** ondansetron (ZOFRAN) IV, sodium chloride   Time Spent in minutes  25  See all Orders from today for further details   Lala Lund M.D on 02/27/2020 at 10:49 AM  To page go to [email or web address] - use universal password  [facility] -  Office  [phone]    Objective:   Vitals:   02/26/20 0507 02/26/20 0830 02/26/20 2023 02/27/20 0732  BP: (!) 88/75 (!) 86/70 97/77 108/79  Pulse: (!) 102 97 97 (!) 123  Resp: 20 10 20 20   Temp: 98.5 F (36.9 C) 98.4 F (36.9 C) 98.7 F (37.1 C) 99.1 F (37.3 C)  TempSrc: Oral Oral Axillary Axillary  SpO2: 100% 100% 97% 97%  Weight:      Height:  Wt Readings from Last 3 Encounters:  02/16/20 67 kg  02/08/20 67.1 kg  02/03/20 69.7 kg     Intake/Output Summary (Last 24 hours) at 02/27/2020 1049 Last data filed at 02/27/2020 0915 Gross per 24 hour  Intake 550 ml  Output 700 ml  Net -150 ml     Physical Exam  Patient is awake but appears quite frail, in no discomfort wearing nonrebreather mask Few crackles with good air movement bilaterally RRR Abdomen soft nontender   Data Review:    CBC Recent Labs  Lab 02/22/20 0401 02/23/20 0302 02/24/20 0405 02/25/20 0134 02/26/20 0151  WBC 11.3* 10.5 10.1 8.8 20.0*  HGB 14.1 13.1 12.9* 13.2 15.2  HCT 41.9 39.1 39.1 39.7 46.6  PLT 245 227 221 221 248  MCV 98.6 99.7 100.3* 99.3 100.9*  MCH 33.2 33.4 33.1 33.0 32.9  MCHC 33.7 33.5 33.0 33.2 32.6  RDW 13.6 13.6 13.5 13.3 13.3    Chemistries  Recent Labs  Lab 02/21/20 0747 02/22/20 0401 02/23/20 0302 02/24/20 0405  NA 139 140 139 139  K 4.2 4.1 3.8 4.0  CL 101 97* 97* 100  CO2 26 29 32 32  GLUCOSE 129* 161* 120* 116*  BUN 37* 50* 48* 42*  CREATININE 0.99 1.08 1.16 1.33*  CALCIUM 8.8* 9.0 8.6* 8.6*  AST 22 22 23 27   ALT 19 22 21 26   ALKPHOS 61 72 63 70  BILITOT 0.8 1.1 1.2 0.7    ------------------------------------------------------------------------------------------------------------------ No results for input(s): CHOL, HDL, LDLCALC, TRIG, CHOLHDL, LDLDIRECT in the last 72 hours.  Lab Results  Component Value Date   HGBA1C 5.1 05/01/2017   ------------------------------------------------------------------------------------------------------------------ No results for input(s): TSH, T4TOTAL, T3FREE, THYROIDAB in the last 72 hours.  Invalid input(s): FREET3 ------------------------------------------------------------------------------------------------------------------ No results for input(s): VITAMINB12, FOLATE, FERRITIN, TIBC, IRON, RETICCTPCT in the last 72 hours.  Coagulation profile No results for input(s): INR, PROTIME in the last 168 hours.  Recent Labs    02/25/20 0134 02/26/20 0151  DDIMER 2.00* 2.64*    Cardiac Enzymes No results for input(s): CKMB, TROPONINI, MYOGLOBIN in the last 168 hours.  Invalid input(s): CK ------------------------------------------------------------------------------------------------------------------    Component Value Date/Time   BNP 179.7 (H) 02/26/2020 0444    Micro Results No results found for this or any previous visit (from the past 240 hour(s)).  Radiology Reports DG Chest 2 View  Result Date: 02/03/2020 CLINICAL DATA:  Hypotension, shortness of breath EXAM: CHEST - 2 VIEW COMPARISON:  12/20/2019 FINDINGS: Patchy airspace disease in the lower lobes concerning for pneumonia. Heart is normal size. No effusions or pneumothorax. No acute bony abnormality. IMPRESSION: Patchy bilateral lower lobe airspace opacities concerning for pneumonia. Electronically Signed   By: Rolm Baptise M.D.   On: 02/03/2020 17:42   CT Angio Chest PE W and/or Wo Contrast  Result Date: 02/15/2020 CLINICAL DATA:  Worsening shortness of breath. EXAM: CT ANGIOGRAPHY CHEST WITH CONTRAST TECHNIQUE: Multidetector CT imaging of the  chest was performed using the standard protocol during bolus administration of intravenous contrast. Multiplanar CT image reconstructions and MIPs were obtained to evaluate the vascular anatomy. CONTRAST:  28mL OMNIPAQUE IOHEXOL 350 MG/ML SOLN COMPARISON:  CT dated 02/07/2020 FINDINGS: Cardiovascular: Contrast injection is sufficient to demonstrate satisfactory opacification of the pulmonary arteries to the segmental level. There is no pulmonary embolus or evidence of right heart strain. The size of the main pulmonary artery is normal. Normal heart size with coronary artery calcification. The course and caliber of the aorta are normal. There  is mild atherosclerotic calcification. Opacification decreased due to pulmonary arterial phase contrast bolus timing. Mediastinum/Nodes: -- No mediastinal lymphadenopathy. -- No hilar lymphadenopathy. -- No axillary lymphadenopathy. -- No supraclavicular lymphadenopathy. -- Normal thyroid gland where visualized. -  Unremarkable esophagus. Lungs/Pleura: Again noted is a cavitary mass in the right lower lobe similar to prior study. There are persistent ground-glass airspace opacities in the right upper lobe which are essentially stable from prior study. There are worsening peripheral ground-glass airspace opacities in the left lower and left upper lobe. There is no pneumothorax. No large pleural effusion. Upper Abdomen: Contrast bolus timing is not optimized for evaluation of the abdominal organs. The visualized portions of the organs of the upper abdomen are normal. Musculoskeletal: There are subacute right-sided rib fractures involving the seventh through ninth ribs. Review of the MIP images confirms the above findings. IMPRESSION: 1. No evidence for acute pulmonary embolus. 2. Worsening peripheral ground-glass airspace opacities in the left lower and left upper lobe, concerning for pneumonia (viral or bacterial) persistent cavitary mass in the right lower lobe. 3. Subacute  right-sided rib fractures involving the seventh through ninth ribs. Aortic Atherosclerosis (ICD10-I70.0). Electronically Signed   By: Constance Holster M.D.   On: 02/15/2020 17:36   CT Angio Chest PE W and/or Wo Contrast  Result Date: 02/07/2020 CLINICAL DATA:  Positive D-dimer, shortness of breath, tachycardia. EXAM: CT ANGIOGRAPHY CHEST WITH CONTRAST TECHNIQUE: Multidetector CT imaging of the chest was performed using the standard protocol during bolus administration of intravenous contrast. Multiplanar CT image reconstructions and MIPs were obtained to evaluate the vascular anatomy. CONTRAST:  173mL OMNIPAQUE IOHEXOL 350 MG/ML SOLN COMPARISON:  12/20/2019 FINDINGS: Cardiovascular: No filling defects in the pulmonary arteries to suggest pulmonary emboli. Heart is normal size. Aortic and coronary artery calcifications. No aneurysm. Mediastinum/Nodes: Stable borderline sized right hilar lymph node. Scattered small and borderline sized mediastinal lymph nodes, stable. Trachea and esophagus are unremarkable. Thyroid unremarkable. Lungs/Pleura: Trace right pleural effusion. Masslike consolidation in the right lower lobe is again noted and is stable since prior study. New rounded masslike area of consolidation in the right upper lobe measuring 2.5 cm. Increasing airspace disease posteriorly and peripherally in the right upper lobe and superior segment of the right lower lobe. Increasing ground-glass airspace opacity posteriorly in the superior segment of the left lower lobe. Paraseptal emphysema. Peripheral reticulation in the lungs bilaterally compatible with fibrosis. None none Upper Abdomen: Imaging into the upper abdomen demonstrates no acute findings. Musculoskeletal: Chest wall soft tissues are unremarkable. No acute bony abnormality. Review of the MIP images confirms the above findings. IMPRESSION: Rounded area of masslike consolidation in the right lower lobe with cavitation again noted, unchanged. New  airspace disease seen in the right upper lobe and right lower lobe as well as superior segment of the left lower lobe. These areas are concerning for pneumonia. Areas of peripheral interstitial prominence and fibrosis bilaterally. Right hilar and borderline mediastinal lymph nodes are stable. Coronary artery disease. Aortic Atherosclerosis (ICD10-I70.0). Electronically Signed   By: Rolm Baptise M.D.   On: 02/07/2020 20:24   DG Chest Port 1 View  Result Date: 02/25/2020 CLINICAL DATA:  Shortness of breath EXAM: PORTABLE CHEST 1 VIEW COMPARISON:  02/15/2020 FINDINGS: Patchy pulmonary infiltrates with subpleural and right apical predilection. No effusion or pneumothorax. Normal heart size. Aortic tortuosity. IMPRESSION: Stable pulmonary infiltrates. Electronically Signed   By: Monte Fantasia M.D.   On: 02/25/2020 05:44   DG Chest Port 1 View  Result Date: 02/15/2020 CLINICAL  DATA:  Cough, worsening dyspnea. EXAM: PORTABLE CHEST 1 VIEW COMPARISON:  02/07/2020 FINDINGS: Improved aeration at the right lung base compared to the prior examination. Interstitial thickening and chronic changes at the left lung base are unchanged. Heart and mediastinum are grossly stable. IMPRESSION: 1. Improved aeration at the right lung base compared to the recent comparison examination. 2. Chronic changes. Electronically Signed   By: Markus Daft M.D.   On: 02/15/2020 13:16   DG Chest Portable 1 View  Result Date: 02/07/2020 CLINICAL DATA:  Shortness of breath, cough, congestion EXAM: PORTABLE CHEST 1 VIEW COMPARISON:  02/03/2020 FINDINGS: Airspace disease in the right mid and lower lung again noted, most confluent at the right lung base compatible with pneumonia. Heart is normal size. Left lung clear. Previously seen patchy opacities at the left base have resolved. No effusions or acute bony abnormality. IMPRESSION: Continued patchy right mid and lower lung airspace opacities concerning for pneumonia. Clearing of the left  basilar opacities since prior study. Electronically Signed   By: Rolm Baptise M.D.   On: 02/07/2020 18:51   VAS [place] LOWER EXTREMITY VENOUS (DVT)  Result Date: 02/24/2020  Lower Venous DVT Study Indications: Swelling.  Risk Factors: + Covid. Performing Technologist: Griffin Basil RCT RDMS  Examination Guidelines: A complete evaluation includes B-mode imaging, spectral Doppler, color Doppler, and power Doppler as needed of all accessible portions of each vessel. Bilateral testing is considered an integral part of a complete examination. Limited examinations for reoccurring indications may be performed as noted. The reflux portion of the exam is performed with the patient in reverse Trendelenburg.  +---------+---------------+---------+-----------+----------+--------------+ RIGHT    CompressibilityPhasicitySpontaneityPropertiesThrombus Aging +---------+---------------+---------+-----------+----------+--------------+ CFV      Full           Yes      Yes                                 +---------+---------------+---------+-----------+----------+--------------+ SFJ      Full                                                        +---------+---------------+---------+-----------+----------+--------------+ FV Prox  Full                                                        +---------+---------------+---------+-----------+----------+--------------+ FV Mid   Full                                                        +---------+---------------+---------+-----------+----------+--------------+ FV DistalFull                                                        +---------+---------------+---------+-----------+----------+--------------+ PFV      Full                                                        +---------+---------------+---------+-----------+----------+--------------+  POP      Full           Yes      Yes                                  +---------+---------------+---------+-----------+----------+--------------+ PTV      Full                                                        +---------+---------------+---------+-----------+----------+--------------+ PERO     Full                                                        +---------+---------------+---------+-----------+----------+--------------+   +---------+---------------+---------+-----------+----------+--------------+ LEFT     CompressibilityPhasicitySpontaneityPropertiesThrombus Aging +---------+---------------+---------+-----------+----------+--------------+ CFV      Full           Yes      Yes                                 +---------+---------------+---------+-----------+----------+--------------+ SFJ      Full                                                        +---------+---------------+---------+-----------+----------+--------------+ FV Prox  Full                                                        +---------+---------------+---------+-----------+----------+--------------+ FV Mid   Full                                                        +---------+---------------+---------+-----------+----------+--------------+ FV DistalFull                                                        +---------+---------------+---------+-----------+----------+--------------+ PFV      Full                                                        +---------+---------------+---------+-----------+----------+--------------+ POP      Full           Yes      Yes                                 +---------+---------------+---------+-----------+----------+--------------+  PTV      Full                                                        +---------+---------------+---------+-----------+----------+--------------+ PERO     Full                                                         +---------+---------------+---------+-----------+----------+--------------+     Summary: RIGHT: - There is no evidence of deep vein thrombosis in the lower extremity.  - No cystic structure found in the popliteal fossa.  LEFT: - There is no evidence of deep vein thrombosis in the lower extremity.  - No cystic structure found in the popliteal fossa.  *See table(s) above for measurements and observations. Electronically signed by Monica Martinez MD on 02/24/2020 at 3:25:02 PM.    Final    VAS [place] LOWER EXTREMITY VENOUS (DVT)  Result Date: 02/10/2020  Lower Venous DVT Study Indications: Elevated Ddimer.  Risk Factors: COVID 19 positive. Comparison Study: No prior studies. Performing Technologist: Oliver Hum RVT  Examination Guidelines: A complete evaluation includes B-mode imaging, spectral Doppler, color Doppler, and power Doppler as needed of all accessible portions of each vessel. Bilateral testing is considered an integral part of a complete examination. Limited examinations for reoccurring indications may be performed as noted. The reflux portion of the exam is performed with the patient in reverse Trendelenburg.  +---------+---------------+---------+-----------+----------+--------------+ RIGHT    CompressibilityPhasicitySpontaneityPropertiesThrombus Aging +---------+---------------+---------+-----------+----------+--------------+ CFV      Full           Yes      Yes                                 +---------+---------------+---------+-----------+----------+--------------+ SFJ      Full                                                        +---------+---------------+---------+-----------+----------+--------------+ FV Prox  Full                                                        +---------+---------------+---------+-----------+----------+--------------+ FV Mid   Full                                                         +---------+---------------+---------+-----------+----------+--------------+ FV DistalFull                                                        +---------+---------------+---------+-----------+----------+--------------+  PFV      Full                                                        +---------+---------------+---------+-----------+----------+--------------+ POP      Full           Yes      Yes                                 +---------+---------------+---------+-----------+----------+--------------+ PTV      Full                                                        +---------+---------------+---------+-----------+----------+--------------+ PERO     Full                                                        +---------+---------------+---------+-----------+----------+--------------+   +---------+---------------+---------+-----------+----------+--------------+ LEFT     CompressibilityPhasicitySpontaneityPropertiesThrombus Aging +---------+---------------+---------+-----------+----------+--------------+ CFV      Full           Yes      Yes                                 +---------+---------------+---------+-----------+----------+--------------+ SFJ      Full                                                        +---------+---------------+---------+-----------+----------+--------------+ FV Prox  Full                                                        +---------+---------------+---------+-----------+----------+--------------+ FV Mid   Full                                                        +---------+---------------+---------+-----------+----------+--------------+ FV DistalFull                                                        +---------+---------------+---------+-----------+----------+--------------+ PFV      Full                                                         +---------+---------------+---------+-----------+----------+--------------+  POP      Full           Yes      Yes                                 +---------+---------------+---------+-----------+----------+--------------+ PTV      Full                                                        +---------+---------------+---------+-----------+----------+--------------+ PERO     Full                                                        +---------+---------------+---------+-----------+----------+--------------+     Summary: RIGHT: - There is no evidence of deep vein thrombosis in the lower extremity.  - No cystic structure found in the popliteal fossa.  LEFT: - There is no evidence of deep vein thrombosis in the lower extremity.  - No cystic structure found in the popliteal fossa.  *See table(s) above for measurements and observations. Electronically signed by Ruta Hinds MD on 02/10/2020 at 6:25:28 PM.    Final    US Abdomen Limited RUQ (LIVER/GB)  Result Date: 02/15/2020 CLINICAL DATA:  Right upper quadrant abdominal pain. History of prior cholecystectomy. EXAM: ULTRASOUND ABDOMEN LIMITED RIGHT UPPER QUADRANT COMPARISON:  None. FINDINGS: Gallbladder: The gallbladder is surgically absent. Common bile duct: Diameter: 4.9 mm Liver: No focal lesion identified. Within normal limits in parenchymal echogenicity. Portal vein is patent on color Doppler imaging with normal direction of blood flow towards the liver. Other: The study is limited secondary to overlying bowel gas. IMPRESSION: 1. Findings consistent with prior cholecystectomy. 2. Otherwise, unremarkable right upper quadrant ultrasound. Electronically Signed   By: Virgina Norfolk M.D.   On: 02/15/2020 21:02

## 2020-02-28 MED ORDER — OXYMETAZOLINE HCL 0.05 % NA SOLN: 1.0000 | Freq: Two times a day (BID) | NASAL | 0 refills | Status: AC

## 2020-02-28 MED ORDER — BENZONATATE 200 MG PO CAPS: 200.0000 mg | ORAL_CAPSULE | Freq: Three times a day (TID) | ORAL | 0 refills | Status: AC

## 2020-02-28 NOTE — Discharge Summary (Signed)
Melvin Foley JHE:[id number] DOB: 18-Sep-1945 DOA: 02/15/2020  PCP: Wallie Char, FNP  Admit date: 02/15/2020  Discharge date: 02/28/2020  Admitted From: Home   Disposition:  Res.Hospice   Recommendations for Outpatient Follow-up:   Follow up with PCP in 1-2 weeks  PCP Please obtain BMP/CBC, 2 view CXR in 1week,  (see Discharge instructions)   PCP Please follow up on the following pending results:    Home Health: None   Equipment/Devices: None  Consultations: Pall.care Discharge Condition: Guarded   CODE STATUS: DNR   Diet Recommendation: Soft diet with feeding assistance and aspiration precautions for comfort   Chief Complaint  Patient presents with  . Covid Positive  . Pneumonia     Brief history of present illness from the day of admission and additional interim summary    Patient is a 74 y.o. male with PMHx of stage IIIb adenocarcinoma-finished radiation approximately 6 weeks back (follows at [facility])-recently hospitalized from 10/22-10/25 COVID-19 pneumonia-did not require O2 on discharge-presented back to the ED on 10/30 with shortness of breath-found to have severe hypoxic respiratory failure due to ongoing COVID-19 pneumonitis.  COVID-19 vaccinated status: Unvaccinated   Significant Events: 10/18>> evaluated at [facility] for hypotension/possible pneumonia-refused admission-Covid 19 negative 10/22-10/25>> hospitalization for Covid-discharged on room air. 10/30>> readmitted with severe hypoxemia-due to ongoing COVID-19 pneumonitis.  Significant studies: 10/22>>Chest x-ray: Patchy right mid/lower lung opacities concerning for pneumonia 10/22>> CTA chest: New airspace disease in the right upper/right lower lobe/left lower lobe-rounded area of masslike consolidation in the  right lower lobe with cavitation again noted. 10/31>> CTA chest: No PE, worsening peripheral groundglass opacities, subacute right sided rib fractures along the seventh through ninth ribs.  COVID-19 medications: Steroids: 10/22>> Remdesivir: 10/22>>10/26 Baricitinib: 10/31>>  Antibiotics: Vancomycin: 10/30>> 10/31 Cefepime: 10/30>> 10/31  Microbiology data: 10/22 >>blood culture: No growth 10/30>> blood culture: No growth                                                                 Hospital Course   Acute hypoxic respiratory failure secondary to Covid 19 Viral pneumonia: Improving-Down to 2 L of oxygen at rest (at one point was on 12 L of HFNC).  With history of underlying lung malignancy which he has chosen not to get treatment for, he has been aggressively treated with IV steroids, baricitinib and remdesivir despite which his pulmonary status has been going down gradually, as per discussions with family heremains DNR now transition to full comfort measures.  Residential hospice awaited, quarantine ends on 02/28/2020.  All noncomfort meds will be stopped    Other medical issues addressed earlier this admission during this hospitalization prior to him being transition to comfort measures are below.    Epistaxis-mild-resolved-treated with Afrin nasal spray.  Continue monitoring.  Elevated  D-dimer.  Negative leg ultrasound.  COPD: No evidence of flare-continue bronchodilators  Chronic diastolic heart failure: Euvolemic.  Hypotension.  Resolved after IVF.  History of prior CVA/carotid endarterectomy: Continue Plavix.  Stage IIIb adenocarcinoma of the lung: Claims recently completed RTX at [facility] 6 weeks back-before that he was on chemo at the [facility] hospital.  CT chest continues to show right lower lobe mass.  Follow with oncology post discharge. However per spouse on 11/2-patient has made up his mind not to pursue any further treatment for this cancer.   Patient and spouse wish palliative care which has been involved, they eventually want home hospice which will be arranged.  Incidental finding of right rib fractures: Supportive care.  Does not have significant pain-apparently did fall off his bed (soft fall per wife) approximately a week back-per spouse-she was told that he is going to have weak ribs due to radiation.  Schizophrenia: Appears stable-continue thiothixene    Discharge diagnosis     Active Problems:   Hyperlipidemia   Schizophrenia ([place])   Acute respiratory disease due to COVID-19 virus   Chronic diastolic CHF (congestive heart failure) (HCC)   COPD (chronic obstructive pulmonary disease) (HCC)   Acute respiratory failure with hypoxia (HCC)   Pneumonia due to COVID-19 virus   Closed rib fracture   Palliative care encounter   Malignant neoplasm of lung [facility])    Discharge instructions    Discharge Instructions    Discharge instructions   Complete by: As directed    Disposition.  Residential hospice Condition.  Guarded CODE STATUS.  DNR Activity.  With assistance as tolerated, full fall precautions. Diet.  Soft with feeding assistance and aspiration precautions. Goal of care.  Comfort.   No dressing needed   Complete by: As directed       Discharge Medications   Allergies as of 02/28/2020      Reactions   Statins Rash, Other (See Comments)   Bone pain, Myalgias      Medication List    STOP taking these medications   clopidogrel 75 MG tablet Commonly known as: PLAVIX   gemfibrozil 600 MG tablet Commonly known as: LOPID   methylPREDNISolone 4 MG Tbpk tablet Commonly known as: MEDROL DOSEPAK   omeprazole 40 MG capsule Commonly known as: PRILOSEC   rosuvastatin 40 MG tablet Commonly known as: CRESTOR   Vitamin D3 50 MCG (2000 UT) capsule     TAKE these medications   albuterol 108 (90 Base) MCG/ACT inhaler Commonly known as: VENTOLIN HFA Inhale 1-2 puffs into the lungs every 6 (six)  hours as needed for wheezing or shortness of breath.   benzonatate 200 MG capsule Commonly known as: TESSALON Take 1 capsule (200 mg total) by mouth 3 (three) times daily.   guaiFENesin 600 MG 12 hr tablet Commonly known as: MUCINEX Take 600 mg by mouth 2 (two) times daily.   ondansetron 8 MG tablet Commonly known as: ZOFRAN Take 8 mg by mouth 3 (three) times daily as needed for nausea or vomiting.   oxymetazoline 0.05 % nasal spray Commonly known as: AFRIN Place 1 spray into both nostrils 2 (two) times daily.   thiothixene 5 MG capsule Commonly known as: NAVANE Take 15 mg by mouth at bedtime. 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  A walker will not resolve issue with performing activities of daily living. A wheelchair will allow patient to safely perform daily activities. Patient is not able to propel themselves in the home using a standard weight wheelchair due to weakness. Patient can self propel in the lightweight wheelchair. Length of need lefetime Accessories: elevating leg rests (ELRs), wheel locks, extensions and anti-tippers.   02/21/20 1412   02/21/20 1136  For home use only DME 3 n 1  Once        02/21/20 1135   02/21/20 1135  For home use only DME oxygen  Once       Comments: 2 L at rest, 6 L with ambulation.  Question Answer Comment  Length of Need 6 Months   Mode or (Route) Nasal cannula   Liters per Minute 6   Oxygen conserving device Yes   Oxygen delivery system Gas      02/21/20 1134           Discharge Care Instructions  (From admission, onward)         Start     Ordered   02/28/20 0000  No dressing needed        02/28/20 1540            Follow-up Information    Wallie Char, FNP. Schedule an appointment as soon as possible for a visit.   Specialty: Family Medicine Contact information: [place] [address] [phone]               Major procedures and Radiology Reports - PLEASE review detailed and final reports thoroughly  -        DG Chest 2 View  Result Date: 02/03/2020 CLINICAL DATA:  Hypotension, shortness of breath EXAM: CHEST - 2 VIEW COMPARISON:  12/20/2019 FINDINGS: Patchy airspace disease in the lower lobes concerning for pneumonia. Heart is normal size. No effusions or pneumothorax. No acute bony abnormality. IMPRESSION: Patchy bilateral lower lobe airspace opacities concerning for pneumonia. Electronically Signed   By: Rolm Baptise M.D.   On: 02/03/2020 17:42   CT Angio Chest PE W and/or Wo Contrast  Result Date: 02/15/2020 CLINICAL DATA:  Worsening shortness of breath. EXAM: CT ANGIOGRAPHY CHEST WITH CONTRAST TECHNIQUE: Multidetector CT imaging of the chest was performed using the standard protocol during bolus administration of intravenous contrast. Multiplanar CT image reconstructions and MIPs were obtained to evaluate the vascular anatomy. CONTRAST:  25mL OMNIPAQUE IOHEXOL 350 MG/ML SOLN COMPARISON:  CT dated 02/07/2020 FINDINGS: Cardiovascular: Contrast injection is sufficient to demonstrate satisfactory opacification of the pulmonary arteries to the segmental level. There is no pulmonary embolus or evidence of right heart strain. The size of the main pulmonary artery is normal. Normal heart size with coronary artery calcification. The course and caliber of the aorta are normal. There is mild atherosclerotic calcification. Opacification decreased due to pulmonary arterial phase contrast bolus timing. Mediastinum/Nodes: -- No mediastinal lymphadenopathy. -- No hilar lymphadenopathy. -- No axillary lymphadenopathy. -- No supraclavicular lymphadenopathy. --  Normal thyroid gland where visualized. -  Unremarkable esophagus. Lungs/Pleura: Again noted is a cavitary mass in the right lower lobe similar to prior study. There are persistent ground-glass airspace opacities in the right upper lobe which are essentially  stable from prior study. There are worsening peripheral ground-glass airspace opacities in the left lower and left upper lobe. There is no pneumothorax. No large pleural effusion. Upper Abdomen: Contrast bolus timing is not optimized for evaluation of the abdominal organs. The visualized portions of the organs of the upper abdomen are normal. Musculoskeletal: There are subacute right-sided rib fractures involving the seventh through ninth ribs. Review of the MIP images confirms the above findings. IMPRESSION: 1. No evidence for acute pulmonary embolus. 2. Worsening peripheral ground-glass airspace opacities in the left lower and left upper lobe, concerning for pneumonia (viral or bacterial) persistent cavitary mass in the right lower lobe. 3. Subacute right-sided rib fractures involving the seventh through ninth ribs. Aortic Atherosclerosis (ICD10-I70.0). Electronically Signed   By: Constance Holster M.D.   On: 02/15/2020 17:36   CT Angio Chest PE W and/or Wo Contrast  Result Date: 02/07/2020 CLINICAL DATA:  Positive D-dimer, shortness of breath, tachycardia. EXAM: CT ANGIOGRAPHY CHEST WITH CONTRAST TECHNIQUE: Multidetector CT imaging of the chest was performed using the standard protocol during bolus administration of intravenous contrast. Multiplanar CT image reconstructions and MIPs were obtained to evaluate the vascular anatomy. CONTRAST:  156mL OMNIPAQUE IOHEXOL 350 MG/ML SOLN COMPARISON:  12/20/2019 FINDINGS: Cardiovascular: No filling defects in the pulmonary arteries to suggest pulmonary emboli. Heart is normal size. Aortic and coronary artery calcifications. No aneurysm. Mediastinum/Nodes: Stable borderline sized right hilar lymph node. Scattered  small and borderline sized mediastinal lymph nodes, stable. Trachea and esophagus are unremarkable. Thyroid unremarkable. Lungs/Pleura: Trace right pleural effusion. Masslike consolidation in the right lower lobe is again noted and is stable since prior study. New rounded masslike area of consolidation in the right upper lobe measuring 2.5 cm. Increasing airspace disease posteriorly and peripherally in the right upper lobe and superior segment of the right lower lobe. Increasing ground-glass airspace opacity posteriorly in the superior segment of the left lower lobe. Paraseptal emphysema. Peripheral reticulation in the lungs bilaterally compatible with fibrosis. None none Upper Abdomen: Imaging into the upper abdomen demonstrates no acute findings. Musculoskeletal: Chest wall soft tissues are unremarkable. No acute bony abnormality. Review of the MIP images confirms the above findings. IMPRESSION: Rounded area of masslike consolidation in the right lower lobe with cavitation again noted, unchanged. New airspace disease seen in the right upper lobe and right lower lobe as well as superior segment of the left lower lobe. These areas are concerning for pneumonia. Areas of peripheral interstitial prominence and fibrosis bilaterally. Right hilar and borderline mediastinal lymph nodes are stable. Coronary artery disease. Aortic Atherosclerosis (ICD10-I70.0). Electronically Signed   By: Rolm Baptise M.D.   On: 02/07/2020 20:24   DG Chest Port 1 View  Result Date: 02/25/2020 CLINICAL DATA:  Shortness of breath EXAM: PORTABLE CHEST 1 VIEW COMPARISON:  02/15/2020 FINDINGS: Patchy pulmonary infiltrates with subpleural and right apical predilection. No effusion or pneumothorax. Normal heart size. Aortic tortuosity. IMPRESSION: Stable pulmonary infiltrates. Electronically Signed   By: Monte Fantasia M.D.   On: 02/25/2020 05:44   DG Chest Port 1 View  Result Date: 02/15/2020 CLINICAL DATA:  Cough, worsening dyspnea.  EXAM: PORTABLE CHEST 1 VIEW COMPARISON:  02/07/2020 FINDINGS: Improved aeration at the right lung base compared to the prior examination. Interstitial thickening and chronic changes at the left lung base are unchanged. Heart and mediastinum are grossly stable. IMPRESSION: 1. Improved aeration at the right lung base compared to the recent comparison examination. 2. Chronic changes. Electronically Signed   By: Scherrie Gerlach.D.  On: 02/15/2020 13:16   DG Chest Portable 1 View  Result Date: 02/07/2020 CLINICAL DATA:  Shortness of breath, cough, congestion EXAM: PORTABLE CHEST 1 VIEW COMPARISON:  02/03/2020 FINDINGS: Airspace disease in the right mid and lower lung again noted, most confluent at the right lung base compatible with pneumonia. Heart is normal size. Left lung clear. Previously seen patchy opacities at the left base have resolved. No effusions or acute bony abnormality. IMPRESSION: Continued patchy right mid and lower lung airspace opacities concerning for pneumonia. Clearing of the left basilar opacities since prior study. Electronically Signed   By: Rolm Baptise M.D.   On: 02/07/2020 18:51   VAS [place] LOWER EXTREMITY VENOUS (DVT)  Result Date: 02/24/2020  Lower Venous DVT Study Indications: Swelling.  Risk Factors: + Covid. Performing Technologist: Griffin Basil RCT RDMS  Examination Guidelines: A complete evaluation includes B-mode imaging, spectral Doppler, color Doppler, and power Doppler as needed of all accessible portions of each vessel. Bilateral testing is considered an integral part of a complete examination. Limited examinations for reoccurring indications may be performed as noted. The reflux portion of the exam is performed with the patient in reverse Trendelenburg.  +---------+---------------+---------+-----------+----------+--------------+ RIGHT    CompressibilityPhasicitySpontaneityPropertiesThrombus Aging  +---------+---------------+---------+-----------+----------+--------------+ CFV      Full           Yes      Yes                                 +---------+---------------+---------+-----------+----------+--------------+ SFJ      Full                                                        +---------+---------------+---------+-----------+----------+--------------+ FV Prox  Full                                                        +---------+---------------+---------+-----------+----------+--------------+ FV Mid   Full                                                        +---------+---------------+---------+-----------+----------+--------------+ FV DistalFull                                                        +---------+---------------+---------+-----------+----------+--------------+ PFV      Full                                                        +---------+---------------+---------+-----------+----------+--------------+ POP      Full           Yes      Yes                                 +---------+---------------+---------+-----------+----------+--------------+  PTV      Full                                                        +---------+---------------+---------+-----------+----------+--------------+ PERO     Full                                                        +---------+---------------+---------+-----------+----------+--------------+   +---------+---------------+---------+-----------+----------+--------------+ LEFT     CompressibilityPhasicitySpontaneityPropertiesThrombus Aging +---------+---------------+---------+-----------+----------+--------------+ CFV      Full           Yes      Yes                                 +---------+---------------+---------+-----------+----------+--------------+ SFJ      Full                                                         +---------+---------------+---------+-----------+----------+--------------+ FV Prox  Full                                                        +---------+---------------+---------+-----------+----------+--------------+ FV Mid   Full                                                        +---------+---------------+---------+-----------+----------+--------------+ FV DistalFull                                                        +---------+---------------+---------+-----------+----------+--------------+ PFV      Full                                                        +---------+---------------+---------+-----------+----------+--------------+ POP      Full           Yes      Yes                                 +---------+---------------+---------+-----------+----------+--------------+ PTV      Full                                                        +---------+---------------+---------+-----------+----------+--------------+  PERO     Full                                                        +---------+---------------+---------+-----------+----------+--------------+     Summary: RIGHT: - There is no evidence of deep vein thrombosis in the lower extremity.  - No cystic structure found in the popliteal fossa.  LEFT: - There is no evidence of deep vein thrombosis in the lower extremity.  - No cystic structure found in the popliteal fossa.  *See table(s) above for measurements and observations. Electronically signed by Monica Martinez MD on 02/24/2020 at 3:25:02 PM.    Final    VAS [place] LOWER EXTREMITY VENOUS (DVT)  Result Date: 02/10/2020  Lower Venous DVT Study Indications: Elevated Ddimer.  Risk Factors: COVID 19 positive. Comparison Study: No prior studies. Performing Technologist: Oliver Hum RVT  Examination Guidelines: A complete evaluation includes B-mode imaging, spectral Doppler, color Doppler, and power Doppler as needed of all accessible  portions of each vessel. Bilateral testing is considered an integral part of a complete examination. Limited examinations for reoccurring indications may be performed as noted. The reflux portion of the exam is performed with the patient in reverse Trendelenburg.  +---------+---------------+---------+-----------+----------+--------------+ RIGHT    CompressibilityPhasicitySpontaneityPropertiesThrombus Aging +---------+---------------+---------+-----------+----------+--------------+ CFV      Full           Yes      Yes                                 +---------+---------------+---------+-----------+----------+--------------+ SFJ      Full                                                        +---------+---------------+---------+-----------+----------+--------------+ FV Prox  Full                                                        +---------+---------------+---------+-----------+----------+--------------+ FV Mid   Full                                                        +---------+---------------+---------+-----------+----------+--------------+ FV DistalFull                                                        +---------+---------------+---------+-----------+----------+--------------+ PFV      Full                                                        +---------+---------------+---------+-----------+----------+--------------+  POP      Full           Yes      Yes                                 +---------+---------------+---------+-----------+----------+--------------+ PTV      Full                                                        +---------+---------------+---------+-----------+----------+--------------+ PERO     Full                                                        +---------+---------------+---------+-----------+----------+--------------+   +---------+---------------+---------+-----------+----------+--------------+ LEFT      CompressibilityPhasicitySpontaneityPropertiesThrombus Aging +---------+---------------+---------+-----------+----------+--------------+ CFV      Full           Yes      Yes                                 +---------+---------------+---------+-----------+----------+--------------+ SFJ      Full                                                        +---------+---------------+---------+-----------+----------+--------------+ FV Prox  Full                                                        +---------+---------------+---------+-----------+----------+--------------+ FV Mid   Full                                                        +---------+---------------+---------+-----------+----------+--------------+ FV DistalFull                                                        +---------+---------------+---------+-----------+----------+--------------+ PFV      Full                                                        +---------+---------------+---------+-----------+----------+--------------+ POP      Full           Yes      Yes                                 +---------+---------------+---------+-----------+----------+--------------+  PTV      Full                                                        +---------+---------------+---------+-----------+----------+--------------+ PERO     Full                                                        +---------+---------------+---------+-----------+----------+--------------+     Summary: RIGHT: - There is no evidence of deep vein thrombosis in the lower extremity.  - No cystic structure found in the popliteal fossa.  LEFT: - There is no evidence of deep vein thrombosis in the lower extremity.  - No cystic structure found in the popliteal fossa.  *See table(s) above for measurements and observations. Electronically signed by Ruta Hinds MD on 02/10/2020 at 6:25:28 PM.    Final    US Abdomen Limited RUQ  (LIVER/GB)  Result Date: 02/15/2020 CLINICAL DATA:  Right upper quadrant abdominal pain. History of prior cholecystectomy. EXAM: ULTRASOUND ABDOMEN LIMITED RIGHT UPPER QUADRANT COMPARISON:  None. FINDINGS: Gallbladder: The gallbladder is surgically absent. Common bile duct: Diameter: 4.9 mm Liver: No focal lesion identified. Within normal limits in parenchymal echogenicity. Portal vein is patent on color Doppler imaging with normal direction of blood flow towards the liver. Other: The study is limited secondary to overlying bowel gas. IMPRESSION: 1. Findings consistent with prior cholecystectomy. 2. Otherwise, unremarkable right upper quadrant ultrasound. Electronically Signed   By: Virgina Norfolk M.D.   On: 02/15/2020 21:02    Micro Results     No results found for this or any previous visit (from the past 240 hour(s)).  Today   Subjective    Melvin Foley today in bed wearing nonrebreather mask and in no distress, denies any chest or abdominal pain.   Objective   Blood pressure 108/72, pulse (!) 106, temperature 99.7 F (37.6 C), temperature source Axillary, resp. rate (!) 24, height 5\' 8"  (1.727 m), weight 67 kg, SpO2 96 %.   Intake/Output Summary (Last 24 hours) at 02/28/2020 0939 Last data filed at 02/28/2020 0500 Gross per 24 hour  Intake 50 ml  Output 450 ml  Net -400 ml    Exam  Awake Alert, appears quite tired and fatigued, comfortable on nonrebreather mask Old blood around his nostrils, no open sores Good air movement bilaterally Abdominal soft nontender   Data Review   CBC w Diff:  Lab Results  Component Value Date   WBC 20.0 (H) 02/26/2020   HGB 15.2 02/26/2020   HCT 46.6 02/26/2020   PLT 248 02/26/2020   LYMPHOPCT 2 02/20/2020   MONOPCT 3 02/20/2020   EOSPCT 0 02/20/2020   BASOPCT 0 02/20/2020    CMP:  Lab Results  Component Value Date   NA 139 02/24/2020   K 4.0 02/24/2020   CL 100 02/24/2020   CO2 32 02/24/2020   BUN 42 (H)  02/24/2020   CREATININE 1.33 (H) 02/24/2020   PROT 6.3 (L) 02/24/2020   ALBUMIN 2.5 (L) 02/24/2020   BILITOT 0.7 02/24/2020   ALKPHOS 70 02/24/2020   AST 27 02/24/2020   ALT 26 02/24/2020  .   Total  Time in preparing paper work, data evaluation and todays exam - 54 minutes  Lala Lund M.D on 02/28/2020 at 9:39 AM  [facility]   Office  [phone]

## 2020-02-28 NOTE — Discharge Instructions (Signed)
Disposition.  Residential hospice Condition.  Guarded CODE STATUS.  DNR Activity.  With assistance as tolerated, full fall precautions. Diet.  Soft with feeding assistance and aspiration precautions. Goal of care.  Comfort.

## 2020-02-28 NOTE — TOC Transition Note (Signed)
Transition of Care [facility]) - CM/SW Discharge Note   Patient Details  Name: Melvin Foley MRN: [id number] Date of Birth: 1945/08/30  Transition of Care [facility]) CM/SW Contact:  Benard Halsted, LCSW Phone Number: 02/28/2020, 1:54 PM   Clinical Narrative:    Patient [facility] DC to: [facility] Anticipated DC date: 02/28/20 Family notified: Spouse and daughter Transport by: Corey Harold   Per MD patient ready for DC to Hospice. RN to call report prior to discharge [phone]). RN, patient, patient's family, and facility notified of DC. Discharge Summary sent to facility. DC packet on chart. Ambulance transport requested for patient.   CSW will sign off for now as social work intervention is no longer needed. Please consult [place] again if new needs arise.      Final next level of care: [place] Barriers to Discharge: Barriers Resolved   Patient Goals and CMS Choice Patient states their goals for this hospitalization and ongoing recovery are:: to go home      Discharge Placement                Patient to be transferred to facility by: [place] Name of family member notified: Daughter and spouse Patient and family notified of of transfer: 02/28/20  Discharge Plan and Services In-house Referral: Clinical Social Work, Hospice / Palliative Care Discharge Planning Services: CM Consult Post Acute Care Choice: [place]: [place] ([place]) Date [facility] Agency Contacted: 02/19/20 Time [place]: 1138 Representative spoke with at [place]: [place] ([place]) Interventions     Readmission Risk Interventions Readmission Risk Prevention Plan 02/28/2020  Transportation Screening Complete  Medication Review Press photographer) Referral to Pharmacy  PCP or Specialist appointment within 3-5 days of discharge Complete  HRI or [place] Complete  SW Recovery  Care/Counseling Consult Complete  [place] Not Applicable  Some recent data might be hidden

## 2020-02-28 NOTE — TOC Progression Note (Signed)
Transition of Care [facility]) - Progression Note    Patient Details  Name: Melvin Foley MRN: [id number] Date of Birth: [birth year]  Transition of Care [facility]) CM/SW [place], LCSW Phone Number: 02/28/2020, 9:52 AM  Clinical Narrative:    Melvin Foley does not have any beds. [facility] the [facility]) is able to accept patient today and is contacting family to complete paperwork.    Expected Discharge Plan: [place] Barriers to Discharge: Barriers Resolved  Expected Discharge Plan and Services Expected Discharge Plan: [place] In-house Referral: Clinical Social Work, Hospice / Palliative Care Discharge Planning Services: CM Consult Post Acute Care Choice: [place] arrangements for the past 2 months: Single Family Home Expected Discharge Date: 02/28/20                           [facility] Agency: [place] ([place]) Date [place]: 02/19/20 Time [place]: 1138 Representative spoke with at [place]: [place] ([place]) Interventions    Readmission Risk Interventions Readmission Risk Prevention Plan 02/28/2020  Transportation Screening Complete  Medication Review Press photographer) Referral to Pharmacy  PCP or Specialist appointment within 3-5 days of discharge Complete  HRI or [place] Complete  SW Recovery Care/Counseling Consult Complete  [place] Not Applicable  Some recent data might be hidden

## 2020-02-28 NOTE — Progress Notes (Signed)
Report given to Hospice house RN. No questions at this time. Patient Dc with PIV per hospice house request.

## 2020-02-28 NOTE — Progress Notes (Signed)
PROGRESS NOTE                                                                                                                                                                                                             Patient Demographics:    Melvin Foley, is a 74 y.o. male, DOB - [birth year], TWS:[id number]  Outpatient Primary MD for the patient is Wallie Char, FNP   Admit date - 02/15/2020   LOS - 79  Chief Complaint  Patient presents with  . Covid Positive  . Pneumonia       Brief Narrative: Patient is a 74 y.o. male with PMHx of stage IIIb adenocarcinoma-finished radiation approximately 6 weeks back (follows at [facility])-recently hospitalized from 10/22-10/25 COVID-19 pneumonia-did not require O2 on discharge-presented back to the ED on 10/30 with shortness of breath-found to have severe hypoxic respiratory failure due to ongoing COVID-19 pneumonitis.  COVID-19 vaccinated status: Unvaccinated   Significant Events: 10/18>> evaluated at [facility] for hypotension/possible pneumonia-refused admission-Covid 19 negative 10/22-10/25>> hospitalization for Covid-discharged on room air. 10/30>> readmitted with severe hypoxemia-due to ongoing COVID-19 pneumonitis.  Significant studies: 10/22>>Chest x-ray: Patchy right mid/lower lung opacities concerning for pneumonia 10/22>> CTA chest: New airspace disease in the right upper/right lower lobe/left lower lobe-rounded area of masslike consolidation in the right lower lobe with cavitation again noted. 10/31>> CTA chest: No PE, worsening peripheral groundglass opacities, subacute right sided rib fractures along the seventh through ninth ribs.  COVID-19 medications: Steroids: 10/22>> Remdesivir: 10/22>>10/26 Baricitinib: 10/31>>  Antibiotics: Vancomycin: 10/30>> 10/31 Cefepime: 10/30>> 10/31  Microbiology data: 10/22 >>blood culture: No growth 10/30>>  blood culture: No growth  Procedures: None  Consults: None  DVT prophylaxis: enoxaparin (LOVENOX) injection 40 mg Start: 02/15/20 2300    Subjective:   Patient in bed, appears to be in no distress, denies any headache chest or abdominal pain.   Assessment  & Plan :   Acute hypoxic respiratory failure secondary to Covid 19 Viral pneumonia: Improving-Down to 2 L of oxygen at rest (at one point was on 12 L of HFNC).  With history of underlying lung malignancy which he has chosen not to get treatment for, he has been aggressively treated with IV steroids, baricitinib and remdesivir despite which his pulmonary status has been going down gradually, as per discussions with family heremains  DNR now transition to full comfort measures.  Residential hospice awaited, quarantine ends on 02/28/2020.  All noncomfort meds will be stopped  COVID-19 isolation ends on 02/28/2020.   O2 requirements:  SpO2: 96 % O2 Flow Rate (L/min): 15 L/min    Recent Labs  Lab 02/22/20 0401 02/23/20 0302 02/24/20 0405 02/25/20 0134 02/26/20 0151 02/26/20 0444  WBC 11.3* 10.5 10.1 8.8 20.0*  --   HGB 14.1 13.1 12.9* 13.2 15.2  --   HCT 41.9 39.1 39.1 39.7 46.6  --   PLT 245 227 221 221 248  --   CRP 0.6 0.6 0.7 0.6 5.9*  --   BNP  --   --  92.9 114.0*  --  179.7*  DDIMER 1.86* 1.98* 1.85* 2.00* 2.64*  --   PROCALCITON  --   --  <0.10 <0.10 <0.10  --   AST 22 23 27   --   --   --   ALT 22 21 26   --   --   --   ALKPHOS 72 63 70  --   --   --   BILITOT 1.1 1.2 0.7  --   --   --   ALBUMIN 2.7* 2.5* 2.5*  --   --   --       Other medical issues addressed earlier this admission during this hospitalization prior to him being transition to comfort measures are below.    Epistaxis-mild-resolved-treated with Afrin nasal spray.  Continue monitoring.  Elevated D-dimer.  Negative leg ultrasound.  COPD: No evidence of flare-continue bronchodilators  Chronic diastolic heart failure:  Euvolemic.  Hypotension.  Resolved after IVF.  History of prior CVA/carotid endarterectomy: Continue Plavix.  Stage IIIb adenocarcinoma of the lung: Claims recently completed RTX at [facility] 6 weeks back-before that he was on chemo at the [facility] hospital.  CT chest continues to show right lower lobe mass.  Follow with oncology post discharge. However per spouse on 11/2-patient has made up his mind not to pursue any further treatment for this cancer.  Patient and spouse wish palliative care which has been involved, they eventually want home hospice which will be arranged.  Incidental finding of right rib fractures: Supportive care.  Does not have significant pain-apparently did fall off his bed (soft fall per wife) approximately a week back-per spouse-she was told that he is going to have weak ribs due to radiation.  Schizophrenia: Appears stable-continue thiothixene   Debility/deconditioning: Suspect has significant amount of debility/deconditioning at baseline but has more worsening due to acute illness-PT/OT eval completed-home health recommended-have ordered.   Palliative care/goals of care discussion: He has stage IIIb lung cancer for which him and his wife decided to pursue home hospice.  Will initiate comfort measures here if he declines further.  Discussed with family bedside on 02/25/2020 and again with wife on 02/26/2020, transition to comfort measures and distention hospice.      Condition - Guarded  Family Communication  : Spouse - Ruth-[phone]-on 02/23/20 02/24/2020, DNR and discharged with home hospice in a few days. Updated 02/25/20 agrees to pursue comfort measures if he declines further here.  Updated 02/26/2020, comfort measures, residential hospice, 02/28/20  Code Status :  DNR  Diet :  Diet Order            DIET DYS 3 Room service appropriate? Yes; Fluid consistency: Thin  Diet effective now                  Disposition  Plan  :   Status is:  Inpatient  Remains inpatient appropriate because:Inpatient level of care appropriate due to severity of illness  Dispo: The patient is from: Home              Anticipated d/c is to: Residential hospice              Anticipated d/c date is: now              Patient currently is not medically stable to d/c.   Barriers to discharge: Hypoxia requiring O2 supplementation  Antimicorbials  :    Anti-infectives (From admission, onward)   Start     Dose/Rate Route Frequency Ordered Stop   02/16/20 1000  ceFEPIme (MAXIPIME) 2 g in sodium chloride 0.9 % 100 mL IVPB  Status:  Discontinued        2 g 200 mL/hr over 30 Minutes Intravenous Every 12 hours 02/15/20 1951 02/16/20 1105   02/16/20 0800  vancomycin (VANCOREADY) IVPB 500 mg/100 mL  Status:  Discontinued        500 mg 100 mL/hr over 60 Minutes Intravenous Every 12 hours 02/15/20 1844 02/16/20 1105   02/15/20 1845  ceFEPIme (MAXIPIME) 2 g in sodium chloride 0.9 % 100 mL IVPB        2 g 200 mL/hr over 30 Minutes Intravenous  Once 02/15/20 1839 02/15/20 1949   02/15/20 1845  vancomycin (VANCOREADY) IVPB 1500 mg/300 mL        1,500 mg 150 mL/hr over 120 Minutes Intravenous  Once 02/15/20 1844 02/15/20 2152      Inpatient Medications  Scheduled Meds: . benzonatate  200 mg Oral TID  . clopidogrel  75 mg Oral QHS  . enoxaparin (LOVENOX) injection  40 mg Subcutaneous Q24H  . fluticasone  2 spray Each Nare Daily  . fluticasone furoate-vilanterol  1 puff Inhalation Daily  . guaiFENesin  600 mg Oral BID  . mouth rinse  15 mL Mouth Rinse BID  . oxymetazoline  1 spray Each Nare BID  . pantoprazole  40 mg Oral Daily  . sodium chloride flush  3 mL Intravenous Q12H  . thiothixene  15 mg Oral QHS   Continuous Infusions:  PRN Meds:.acetaminophen, albuterol, alum & mag hydroxide-simeth, chlorpheniramine-HYDROcodone, LORazepam, morphine injection, [DISCONTINUED] ondansetron **OR** ondansetron (ZOFRAN) IV, sodium chloride   Time Spent in  minutes  25  See all Orders from today for further details   Lala Lund M.D on 02/28/2020 at 9:32 AM  To page go to [email or web address] - use universal password  [facility] -  Office  [phone]    Objective:   Vitals:   02/26/20 0830 02/26/20 2023 02/27/20 0732 02/27/20 2031  BP: (!) 86/70 97/77 108/79 108/72  Pulse: 97 97 (!) 123 (!) 106  Resp: 10 20 20  (!) 24  Temp: 98.4 F (36.9 C) 98.7 F (37.1 C) 99.1 F (37.3 C) 99.7 F (37.6 C)  TempSrc: Oral Axillary Axillary Axillary  SpO2: 100% 97% 97% 96%  Weight:      Height:        Wt Readings from Last 3 Encounters:  02/16/20 67 kg  02/08/20 67.1 kg  02/03/20 69.7 kg     Intake/Output Summary (Last 24 hours) at 02/28/2020 0932 Last data filed at 02/28/2020 0500 Gross per 24 hour  Intake 50 ml  Output 450 ml  Net -400 ml     Physical Exam  Patient is awake but appears quite frail, in no discomfort wearing  nonrebreather mask Few crackles with good air movement bilaterally RRR Abdomen soft nontender   Data Review:    CBC Recent Labs  Lab 02/22/20 0401 02/23/20 0302 02/24/20 0405 02/25/20 0134 02/26/20 0151  WBC 11.3* 10.5 10.1 8.8 20.0*  HGB 14.1 13.1 12.9* 13.2 15.2  HCT 41.9 39.1 39.1 39.7 46.6  PLT 245 227 221 221 248  MCV 98.6 99.7 100.3* 99.3 100.9*  MCH 33.2 33.4 33.1 33.0 32.9  MCHC 33.7 33.5 33.0 33.2 32.6  RDW 13.6 13.6 13.5 13.3 13.3    Chemistries  Recent Labs  Lab 02/22/20 0401 02/23/20 0302 02/24/20 0405  NA 140 139 139  K 4.1 3.8 4.0  CL 97* 97* 100  CO2 29 32 32  GLUCOSE 161* 120* 116*  BUN 50* 48* 42*  CREATININE 1.08 1.16 1.33*  CALCIUM 9.0 8.6* 8.6*  AST 22 23 27   ALT 22 21 26   ALKPHOS 72 63 70  BILITOT 1.1 1.2 0.7   ------------------------------------------------------------------------------------------------------------------ No results for input(s): CHOL, HDL, LDLCALC, TRIG, CHOLHDL, LDLDIRECT in the last 72 hours.  Lab Results  Component Value  Date   HGBA1C 5.1 05/01/2017   ------------------------------------------------------------------------------------------------------------------ No results for input(s): TSH, T4TOTAL, T3FREE, THYROIDAB in the last 72 hours.  Invalid input(s): FREET3 ------------------------------------------------------------------------------------------------------------------ No results for input(s): VITAMINB12, FOLATE, FERRITIN, TIBC, IRON, RETICCTPCT in the last 72 hours.  Coagulation profile No results for input(s): INR, PROTIME in the last 168 hours.  Recent Labs    02/26/20 0151  DDIMER 2.64*    Cardiac Enzymes No results for input(s): CKMB, TROPONINI, MYOGLOBIN in the last 168 hours.  Invalid input(s): CK ------------------------------------------------------------------------------------------------------------------    Component Value Date/Time   BNP 179.7 (H) 02/26/2020 0444    Micro Results No results found for this or any previous visit (from the past 240 hour(s)).  Radiology Reports DG Chest 2 View  Result Date: 02/03/2020 CLINICAL DATA:  Hypotension, shortness of breath EXAM: CHEST - 2 VIEW COMPARISON:  12/20/2019 FINDINGS: Patchy airspace disease in the lower lobes concerning for pneumonia. Heart is normal size. No effusions or pneumothorax. No acute bony abnormality. IMPRESSION: Patchy bilateral lower lobe airspace opacities concerning for pneumonia. Electronically Signed   By: Rolm Baptise M.D.   On: 02/03/2020 17:42   CT Angio Chest PE W and/or Wo Contrast  Result Date: 02/15/2020 CLINICAL DATA:  Worsening shortness of breath. EXAM: CT ANGIOGRAPHY CHEST WITH CONTRAST TECHNIQUE: Multidetector CT imaging of the chest was performed using the standard protocol during bolus administration of intravenous contrast. Multiplanar CT image reconstructions and MIPs were obtained to evaluate the vascular anatomy. CONTRAST:  70mL OMNIPAQUE IOHEXOL 350 MG/ML SOLN COMPARISON:  CT dated  02/07/2020 FINDINGS: Cardiovascular: Contrast injection is sufficient to demonstrate satisfactory opacification of the pulmonary arteries to the segmental level. There is no pulmonary embolus or evidence of right heart strain. The size of the main pulmonary artery is normal. Normal heart size with coronary artery calcification. The course and caliber of the aorta are normal. There is mild atherosclerotic calcification. Opacification decreased due to pulmonary arterial phase contrast bolus timing. Mediastinum/Nodes: -- No mediastinal lymphadenopathy. -- No hilar lymphadenopathy. -- No axillary lymphadenopathy. -- No supraclavicular lymphadenopathy. -- Normal thyroid gland where visualized. -  Unremarkable esophagus. Lungs/Pleura: Again noted is a cavitary mass in the right lower lobe similar to prior study. There are persistent ground-glass airspace opacities in the right upper lobe which are essentially stable from prior study. There are worsening peripheral ground-glass airspace opacities in the left lower and  left upper lobe. There is no pneumothorax. No large pleural effusion. Upper Abdomen: Contrast bolus timing is not optimized for evaluation of the abdominal organs. The visualized portions of the organs of the upper abdomen are normal. Musculoskeletal: There are subacute right-sided rib fractures involving the seventh through ninth ribs. Review of the MIP images confirms the above findings. IMPRESSION: 1. No evidence for acute pulmonary embolus. 2. Worsening peripheral ground-glass airspace opacities in the left lower and left upper lobe, concerning for pneumonia (viral or bacterial) persistent cavitary mass in the right lower lobe. 3. Subacute right-sided rib fractures involving the seventh through ninth ribs. Aortic Atherosclerosis (ICD10-I70.0). Electronically Signed   By: Constance Holster M.D.   On: 02/15/2020 17:36   CT Angio Chest PE W and/or Wo Contrast  Result Date: 02/07/2020 CLINICAL DATA:   Positive D-dimer, shortness of breath, tachycardia. EXAM: CT ANGIOGRAPHY CHEST WITH CONTRAST TECHNIQUE: Multidetector CT imaging of the chest was performed using the standard protocol during bolus administration of intravenous contrast. Multiplanar CT image reconstructions and MIPs were obtained to evaluate the vascular anatomy. CONTRAST:  143mL OMNIPAQUE IOHEXOL 350 MG/ML SOLN COMPARISON:  12/20/2019 FINDINGS: Cardiovascular: No filling defects in the pulmonary arteries to suggest pulmonary emboli. Heart is normal size. Aortic and coronary artery calcifications. No aneurysm. Mediastinum/Nodes: Stable borderline sized right hilar lymph node. Scattered small and borderline sized mediastinal lymph nodes, stable. Trachea and esophagus are unremarkable. Thyroid unremarkable. Lungs/Pleura: Trace right pleural effusion. Masslike consolidation in the right lower lobe is again noted and is stable since prior study. New rounded masslike area of consolidation in the right upper lobe measuring 2.5 cm. Increasing airspace disease posteriorly and peripherally in the right upper lobe and superior segment of the right lower lobe. Increasing ground-glass airspace opacity posteriorly in the superior segment of the left lower lobe. Paraseptal emphysema. Peripheral reticulation in the lungs bilaterally compatible with fibrosis. None none Upper Abdomen: Imaging into the upper abdomen demonstrates no acute findings. Musculoskeletal: Chest wall soft tissues are unremarkable. No acute bony abnormality. Review of the MIP images confirms the above findings. IMPRESSION: Rounded area of masslike consolidation in the right lower lobe with cavitation again noted, unchanged. New airspace disease seen in the right upper lobe and right lower lobe as well as superior segment of the left lower lobe. These areas are concerning for pneumonia. Areas of peripheral interstitial prominence and fibrosis bilaterally. Right hilar and borderline mediastinal  lymph nodes are stable. Coronary artery disease. Aortic Atherosclerosis (ICD10-I70.0). Electronically Signed   By: Rolm Baptise M.D.   On: 02/07/2020 20:24   DG Chest Port 1 View  Result Date: 02/25/2020 CLINICAL DATA:  Shortness of breath EXAM: PORTABLE CHEST 1 VIEW COMPARISON:  02/15/2020 FINDINGS: Patchy pulmonary infiltrates with subpleural and right apical predilection. No effusion or pneumothorax. Normal heart size. Aortic tortuosity. IMPRESSION: Stable pulmonary infiltrates. Electronically Signed   By: Monte Fantasia M.D.   On: 02/25/2020 05:44   DG Chest Port 1 View  Result Date: 02/15/2020 CLINICAL DATA:  Cough, worsening dyspnea. EXAM: PORTABLE CHEST 1 VIEW COMPARISON:  02/07/2020 FINDINGS: Improved aeration at the right lung base compared to the prior examination. Interstitial thickening and chronic changes at the left lung base are unchanged. Heart and mediastinum are grossly stable. IMPRESSION: 1. Improved aeration at the right lung base compared to the recent comparison examination. 2. Chronic changes. Electronically Signed   By: Markus Daft M.D.   On: 02/15/2020 13:16   DG Chest Portable 1 View  Result Date: 02/07/2020  CLINICAL DATA:  Shortness of breath, cough, congestion EXAM: PORTABLE CHEST 1 VIEW COMPARISON:  02/03/2020 FINDINGS: Airspace disease in the right mid and lower lung again noted, most confluent at the right lung base compatible with pneumonia. Heart is normal size. Left lung clear. Previously seen patchy opacities at the left base have resolved. No effusions or acute bony abnormality. IMPRESSION: Continued patchy right mid and lower lung airspace opacities concerning for pneumonia. Clearing of the left basilar opacities since prior study. Electronically Signed   By: Rolm Baptise M.D.   On: 02/07/2020 18:51   VAS [place] LOWER EXTREMITY VENOUS (DVT)  Result Date: 02/24/2020  Lower Venous DVT Study Indications: Swelling.  Risk Factors: + Covid. Performing Technologist: Griffin Basil RCT RDMS  Examination Guidelines: A complete evaluation includes B-mode imaging, spectral Doppler, color Doppler, and power Doppler as needed of all accessible portions of each vessel. Bilateral testing is considered an integral part of a complete examination. Limited examinations for reoccurring indications may be performed as noted. The reflux portion of the exam is performed with the patient in reverse Trendelenburg.  +---------+---------------+---------+-----------+----------+--------------+ RIGHT    CompressibilityPhasicitySpontaneityPropertiesThrombus Aging +---------+---------------+---------+-----------+----------+--------------+ CFV      Full           Yes      Yes                                 +---------+---------------+---------+-----------+----------+--------------+ SFJ      Full                                                        +---------+---------------+---------+-----------+----------+--------------+ FV Prox  Full                                                        +---------+---------------+---------+-----------+----------+--------------+ FV Mid   Full                                                        +---------+---------------+---------+-----------+----------+--------------+ FV DistalFull                                                        +---------+---------------+---------+-----------+----------+--------------+ PFV      Full                                                        +---------+---------------+---------+-----------+----------+--------------+ POP      Full           Yes      Yes                                 +---------+---------------+---------+-----------+----------+--------------+  PTV      Full                                                        +---------+---------------+---------+-----------+----------+--------------+ PERO     Full                                                         +---------+---------------+---------+-----------+----------+--------------+   +---------+---------------+---------+-----------+----------+--------------+ LEFT     CompressibilityPhasicitySpontaneityPropertiesThrombus Aging +---------+---------------+---------+-----------+----------+--------------+ CFV      Full           Yes      Yes                                 +---------+---------------+---------+-----------+----------+--------------+ SFJ      Full                                                        +---------+---------------+---------+-----------+----------+--------------+ FV Prox  Full                                                        +---------+---------------+---------+-----------+----------+--------------+ FV Mid   Full                                                        +---------+---------------+---------+-----------+----------+--------------+ FV DistalFull                                                        +---------+---------------+---------+-----------+----------+--------------+ PFV      Full                                                        +---------+---------------+---------+-----------+----------+--------------+ POP      Full           Yes      Yes                                 +---------+---------------+---------+-----------+----------+--------------+ PTV      Full                                                        +---------+---------------+---------+-----------+----------+--------------+  PERO     Full                                                        +---------+---------------+---------+-----------+----------+--------------+     Summary: RIGHT: - There is no evidence of deep vein thrombosis in the lower extremity.  - No cystic structure found in the popliteal fossa.  LEFT: - There is no evidence of deep vein thrombosis in the lower extremity.  - No cystic structure found in the popliteal  fossa.  *See table(s) above for measurements and observations. Electronically signed by Monica Martinez MD on 02/24/2020 at 3:25:02 PM.    Final    VAS [place] LOWER EXTREMITY VENOUS (DVT)  Result Date: 02/10/2020  Lower Venous DVT Study Indications: Elevated Ddimer.  Risk Factors: COVID 19 positive. Comparison Study: No prior studies. Performing Technologist: Oliver Hum RVT  Examination Guidelines: A complete evaluation includes B-mode imaging, spectral Doppler, color Doppler, and power Doppler as needed of all accessible portions of each vessel. Bilateral testing is considered an integral part of a complete examination. Limited examinations for reoccurring indications may be performed as noted. The reflux portion of the exam is performed with the patient in reverse Trendelenburg.  +---------+---------------+---------+-----------+----------+--------------+ RIGHT    CompressibilityPhasicitySpontaneityPropertiesThrombus Aging +---------+---------------+---------+-----------+----------+--------------+ CFV      Full           Yes      Yes                                 +---------+---------------+---------+-----------+----------+--------------+ SFJ      Full                                                        +---------+---------------+---------+-----------+----------+--------------+ FV Prox  Full                                                        +---------+---------------+---------+-----------+----------+--------------+ FV Mid   Full                                                        +---------+---------------+---------+-----------+----------+--------------+ FV DistalFull                                                        +---------+---------------+---------+-----------+----------+--------------+ PFV      Full                                                        +---------+---------------+---------+-----------+----------+--------------+  POP       Full           Yes      Yes                                 +---------+---------------+---------+-----------+----------+--------------+ PTV      Full                                                        +---------+---------------+---------+-----------+----------+--------------+ PERO     Full                                                        +---------+---------------+---------+-----------+----------+--------------+   +---------+---------------+---------+-----------+----------+--------------+ LEFT     CompressibilityPhasicitySpontaneityPropertiesThrombus Aging +---------+---------------+---------+-----------+----------+--------------+ CFV      Full           Yes      Yes                                 +---------+---------------+---------+-----------+----------+--------------+ SFJ      Full                                                        +---------+---------------+---------+-----------+----------+--------------+ FV Prox  Full                                                        +---------+---------------+---------+-----------+----------+--------------+ FV Mid   Full                                                        +---------+---------------+---------+-----------+----------+--------------+ FV DistalFull                                                        +---------+---------------+---------+-----------+----------+--------------+ PFV      Full                                                        +---------+---------------+---------+-----------+----------+--------------+ POP      Full           Yes      Yes                                 +---------+---------------+---------+-----------+----------+--------------+  PTV      Full                                                        +---------+---------------+---------+-----------+----------+--------------+ PERO     Full                                                         +---------+---------------+---------+-----------+----------+--------------+     Summary: RIGHT: - There is no evidence of deep vein thrombosis in the lower extremity.  - No cystic structure found in the popliteal fossa.  LEFT: - There is no evidence of deep vein thrombosis in the lower extremity.  - No cystic structure found in the popliteal fossa.  *See table(s) above for measurements and observations. Electronically signed by Ruta Hinds MD on 02/10/2020 at 6:25:28 PM.    Final    US Abdomen Limited RUQ (LIVER/GB)  Result Date: 02/15/2020 CLINICAL DATA:  Right upper quadrant abdominal pain. History of prior cholecystectomy. EXAM: ULTRASOUND ABDOMEN LIMITED RIGHT UPPER QUADRANT COMPARISON:  None. FINDINGS: Gallbladder: The gallbladder is surgically absent. Common bile duct: Diameter: 4.9 mm Liver: No focal lesion identified. Within normal limits in parenchymal echogenicity. Portal vein is patent on color Doppler imaging with normal direction of blood flow towards the liver. Other: The study is limited secondary to overlying bowel gas. IMPRESSION: 1. Findings consistent with prior cholecystectomy. 2. Otherwise, unremarkable right upper quadrant ultrasound. Electronically Signed   By: Virgina Norfolk M.D.   On: 02/15/2020 21:02

## 2020-02-28 NOTE — Progress Notes (Signed)
   [facility]  Received message from [address] that [facility] does not have bed to offer and pt family would be interested in pt transferring to our inpt. facility if we have bed. I have reached out and spoke to [name] pt's wife who is receptive of this transfer and will sign paperwork.  We do have bed to offer and the pt can transfer today after paperwork signed. Thank you for this referral. They will sign paperwork by docusign agreement.   Webb Silversmith RN [phone]

## 2020-03-18 DEATH — deceased

## 2021-03-02 IMAGING — CT CT ANGIO CHEST
3 of 9 series · 18 of 36 positions shown · IV contrast (omnipaque)
Comparison: Radiograph earlier today.  Chest CTA 08/29/2019

CLINICAL DATA: PE suspected, high prob.  Lung cancer.

EXAM:
CT ANGIOGRAPHY CHEST WITH CONTRAST
TECHNIQUE: Multidetector CT imaging of the chest was performed using the
standard protocol during bolus administration of intravenous
contrast. Multiplanar CT image reconstructions and MIPs were
obtained to evaluate the vascular anatomy.
CONTRAST:  100mL OMNIPAQUE IOHEXOL 350 MG/ML SOLN

[Series 5: pe thins · axial · 0.74mm/px · z∈[-273,-51]mm · 14 of 258 slices shown]
[im 18/258  lung]
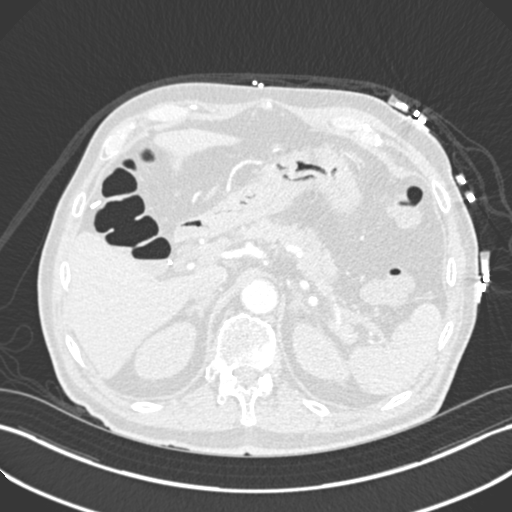
[im 35/258  mediastinal]
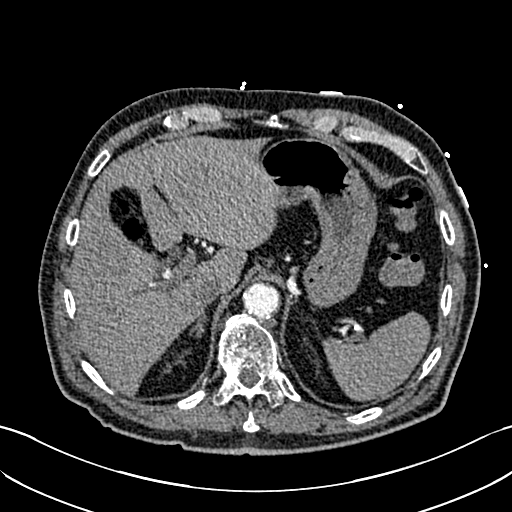
[im 52/258  lung]
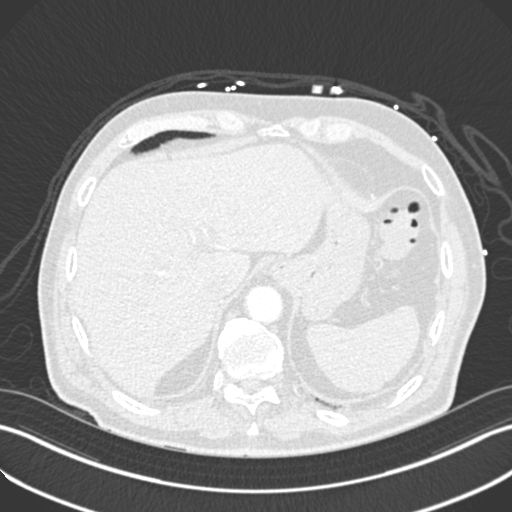
[im 69/258  mediastinal]
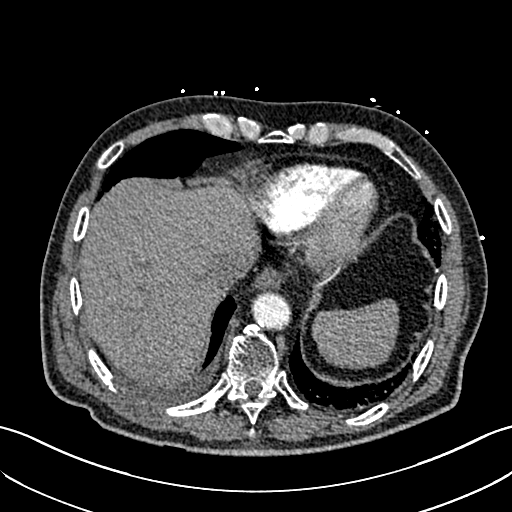
[im 86/258  lung]
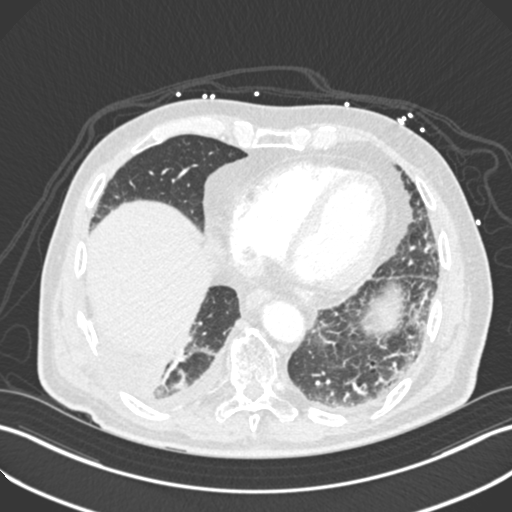
[im 103/258  mediastinal]
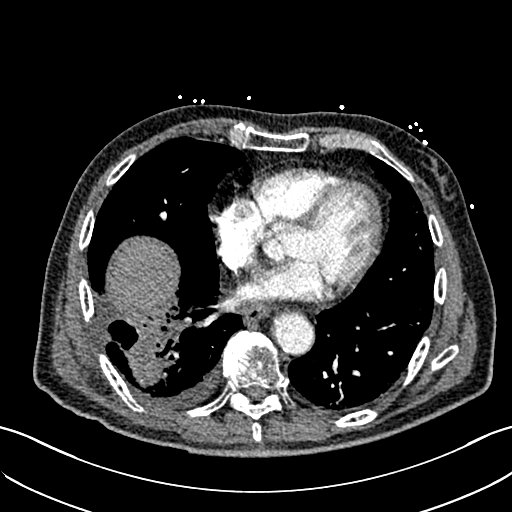
[im 120/258  lung]
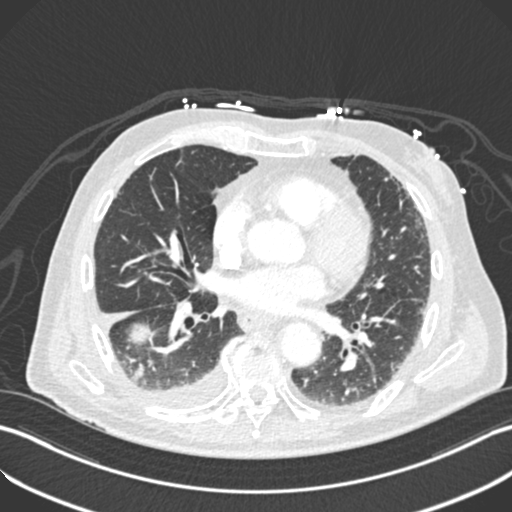
[im 138/258  mediastinal]
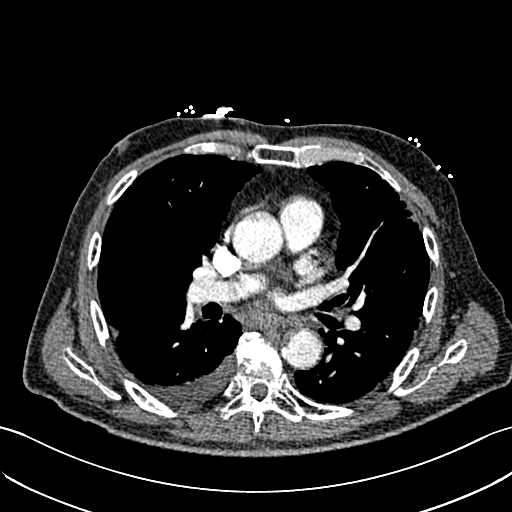
[im 155/258  lung]
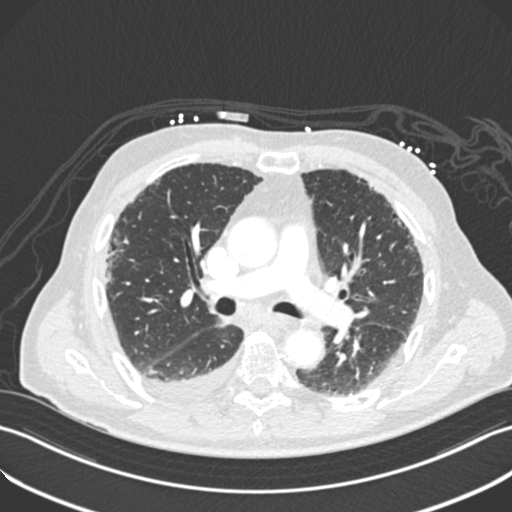
[im 172/258  mediastinal]
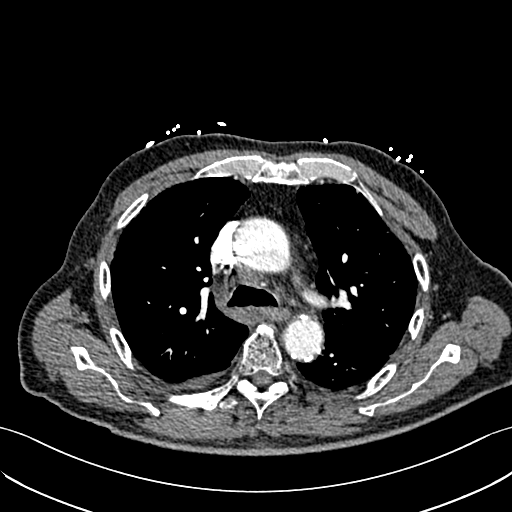
[im 189/258  lung]
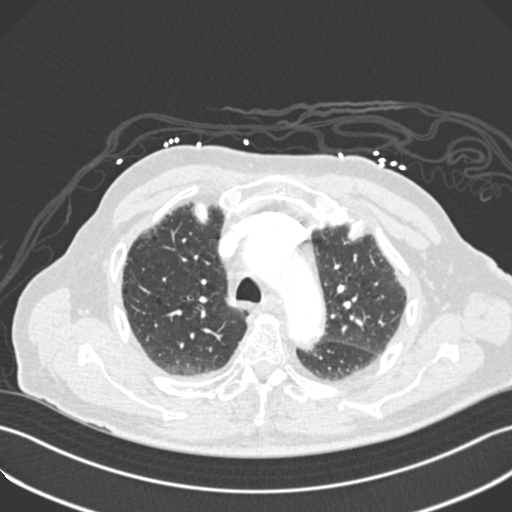
[im 206/258  mediastinal]
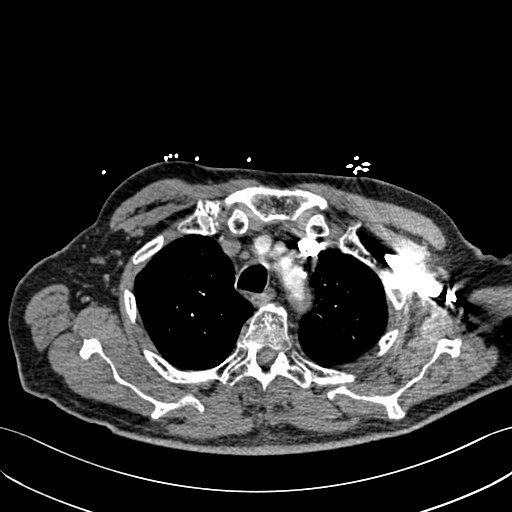
[im 223/258  lung]
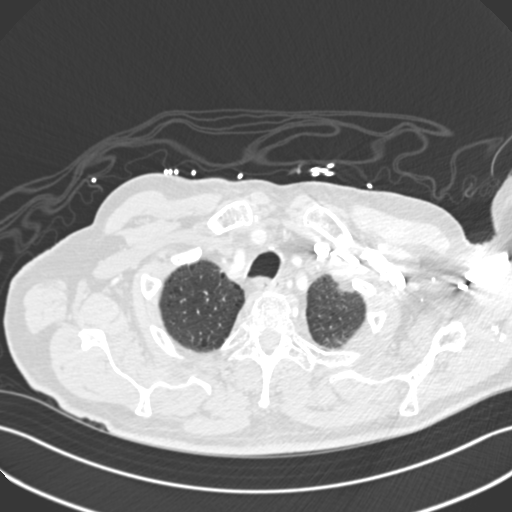
[im 240/258  mediastinal]
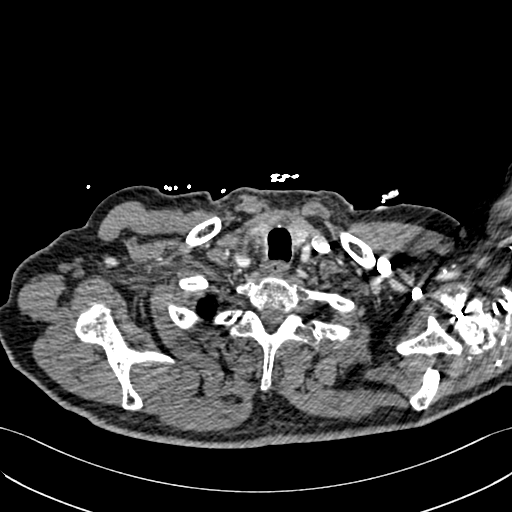

[Series 6: pe lung · axial · 0.98mm/px · z∈[-198,-90]mm · 3 of 74 slices shown]
[im 19/74  mediastinal]
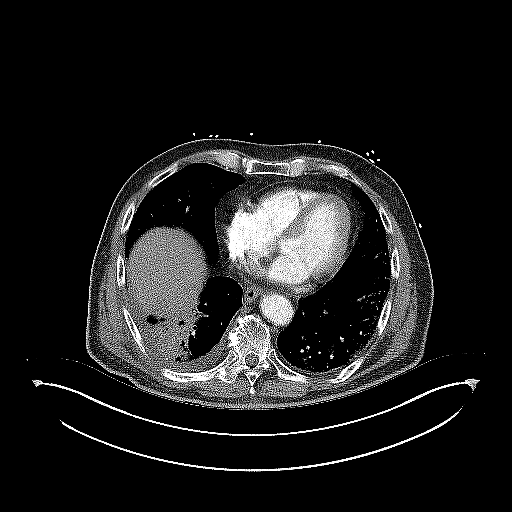
[im 37/74  mediastinal]
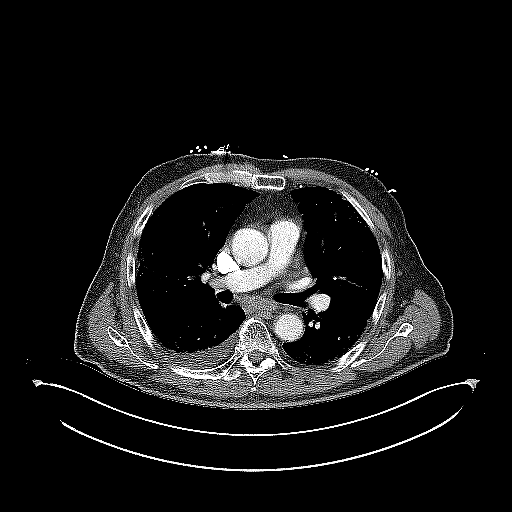
[im 55/74  mediastinal]
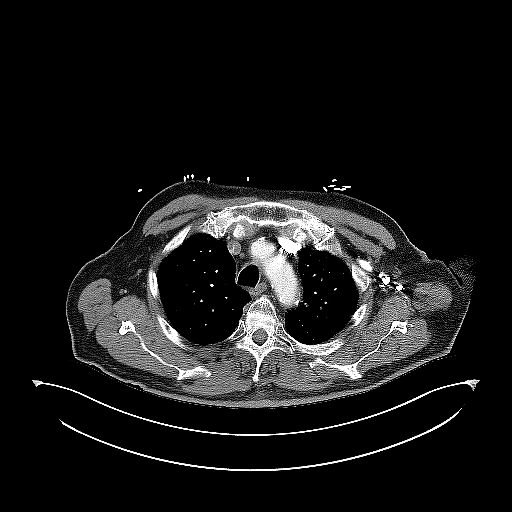

[Series 7: pe coronal mpr · coronal · 0.53mm/px · 1 of 137 slices shown]
[im 69/137  mediastinal]
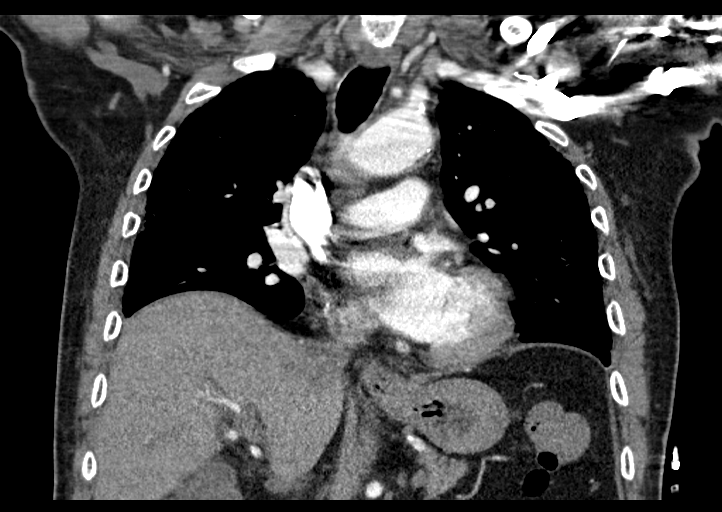

[18 of 36 positions shown; findings below may reference images not displayed]

FINDINGS: Cardiovascular: There are no filling defects within the pulmonary
arteries to suggest pulmonary embolus. Again seen truncation of the
right lower lobe pulmonary arteries in the region of masslike
opacity. Aortic atherosclerosis without aneurysm or dissection. Left
vertebral artery arises directly from the thoracic aorta. Heart is
normal in size. No pericardial effusion. There are coronary artery
calcifications.

Mediastinum/Nodes: Stable 10 mm right hilar lymph node. Decreased
size of lower paratracheal node measuring 9 mm, previously 12 mm. No
new hilar or mediastinal adenopathy. No esophageal wall thickening.
No visualized thyroid nodule.

Lungs/Pleura: Decreased size of masslike consolidation in the right
lower lobe with residual mass measuring approximately 3.9 x 3.3 cm,
measured on series 6, image 50. Adjacent irregular atelectasis.
Small right pleural effusion and pleural thickening. The adjacent 1
cm nodule in the medial right lung base on prior is no longer seen.
Subpleural reticulation in the anterior right upper lobe series 6,
image 37, with decreased soft tissue component from prior exam.
Underlying emphysema and subpleural reticulation/fibrosis,
unchanged. No findings of pulmonary edema.

Upper Abdomen: No acute findings.  Post cholecystectomy.

Musculoskeletal: There are no acute or suspicious osseous
abnormalities. Bones are under mineralized. Degenerative change in
the spine. Prior surgical fixation of the left proximal humerus.

Review of the MIP images confirms the above findings.
IMPRESSION: 1. No pulmonary embolus.
2. Decreased size of masslike consolidation in the right lower lobe
with residual mass measuring approximately 3.9 x 3.3 cm. The
adjacent 1 cm nodule in the medial right lung base on prior is no
longer seen. Small right pleural effusion and pleural thickening.
3. Decreased soft tissue density of the subpleural reticulation in
the right upper lobe from prior.
4. Slight decreased size of mediastinal lymph nodes.

Aortic Atherosclerosis (5FU9S-UQP.P) and Emphysema (5FU9S-RWC.P).

## 2021-05-08 IMAGING — DX DG CHEST 1V PORT
1 series · 1 of 1 positions shown · non-contrast
Comparison: 02/15/2020

CLINICAL DATA: Shortness of breath

EXAM:
PORTABLE CHEST 1 VIEW

[chest]
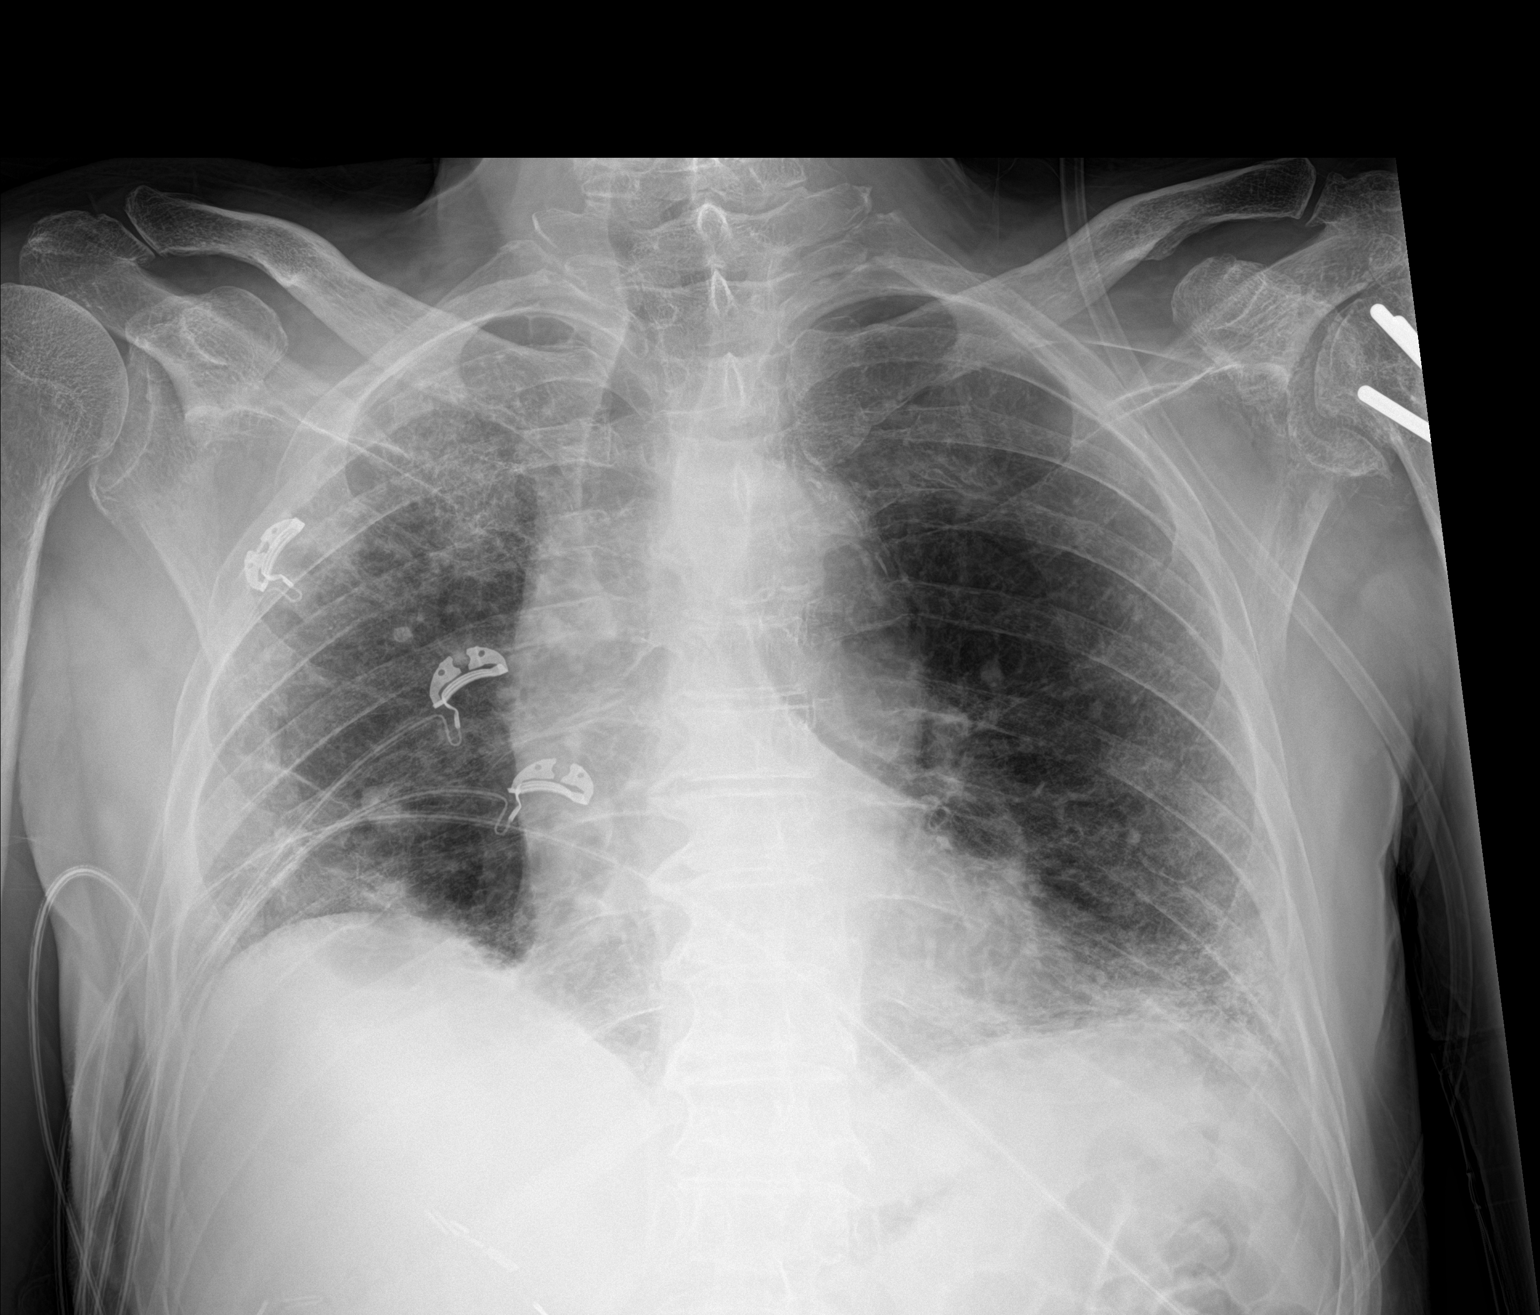

[1 of 1 positions shown; findings below may reference images not displayed]

FINDINGS: Patchy pulmonary infiltrates with subpleural and right apical
predilection. No effusion or pneumothorax. Normal heart size. Aortic
tortuosity.
IMPRESSION: Stable pulmonary infiltrates.
